# Patient Record
Sex: Male | Born: 1950
Health system: Southern US, Community
[De-identification: ages and names within clinical notes are randomized; demographics above are authoritative.]

## PROBLEM LIST (undated history)

## (undated) DIAGNOSIS — I10 Essential (primary) hypertension: Secondary | ICD-10-CM

## (undated) DIAGNOSIS — E785 Hyperlipidemia, unspecified: Secondary | ICD-10-CM

## (undated) DIAGNOSIS — K635 Polyp of colon: Secondary | ICD-10-CM

## (undated) DIAGNOSIS — M1990 Unspecified osteoarthritis, unspecified site: Secondary | ICD-10-CM

## (undated) DIAGNOSIS — K5790 Diverticulosis of intestine, part unspecified, without perforation or abscess without bleeding: Secondary | ICD-10-CM

## (undated) DIAGNOSIS — D039 Melanoma in situ, unspecified: Secondary | ICD-10-CM

## (undated) DIAGNOSIS — C801 Malignant (primary) neoplasm, unspecified: Secondary | ICD-10-CM

## (undated) DIAGNOSIS — Z87891 Personal history of nicotine dependence: Secondary | ICD-10-CM

## (undated) DIAGNOSIS — R0600 Dyspnea, unspecified: Secondary | ICD-10-CM

## (undated) DIAGNOSIS — T7840XA Allergy, unspecified, initial encounter: Secondary | ICD-10-CM

## (undated) DIAGNOSIS — K219 Gastro-esophageal reflux disease without esophagitis: Secondary | ICD-10-CM

## (undated) DIAGNOSIS — I209 Angina pectoris, unspecified: Secondary | ICD-10-CM

## (undated) DIAGNOSIS — D126 Benign neoplasm of colon, unspecified: Secondary | ICD-10-CM

## (undated) HISTORY — DX: Essential (primary) hypertension: I10

## (undated) HISTORY — DX: Malignant (primary) neoplasm, unspecified: C80.1

## (undated) HISTORY — DX: Allergy, unspecified, initial encounter: T78.40XA

## (undated) HISTORY — PX: TOOTH EXTRACTION: SUR596

## (undated) HISTORY — DX: Melanoma in situ, unspecified: D03.9

## (undated) HISTORY — DX: Personal history of nicotine dependence: Z87.891

## (undated) HISTORY — DX: Hyperlipidemia, unspecified: E78.5

## (undated) HISTORY — DX: Polyp of colon: K63.5

## (undated) HISTORY — DX: Diverticulosis of intestine, part unspecified, without perforation or abscess without bleeding: K57.90

## (undated) HISTORY — PX: JOINT REPLACEMENT: SHX530

## (undated) HISTORY — DX: Benign neoplasm of colon, unspecified: D12.6

## (undated) HISTORY — PX: EYE SURGERY: SHX253

---

## 2000-01-22 ENCOUNTER — Encounter: Payer: Self-pay | Admitting: Family Medicine

## 2000-01-22 LAB — CONVERTED CEMR LAB: PSA: 0.5 ng/mL

## 2000-04-23 ENCOUNTER — Encounter: Payer: Self-pay | Admitting: *Deleted

## 2000-04-23 ENCOUNTER — Ambulatory Visit (HOSPITAL_COMMUNITY): Admission: RE | Admit: 2000-04-23 | Discharge: 2000-04-23 | Payer: Self-pay | Admitting: *Deleted

## 2001-05-21 ENCOUNTER — Encounter: Payer: Self-pay | Admitting: Family Medicine

## 2001-05-21 LAB — CONVERTED CEMR LAB: PSA: 0.6 ng/mL

## 2002-01-21 DIAGNOSIS — D126 Benign neoplasm of colon, unspecified: Secondary | ICD-10-CM

## 2002-01-21 DIAGNOSIS — K635 Polyp of colon: Secondary | ICD-10-CM

## 2002-01-21 HISTORY — DX: Polyp of colon: K63.5

## 2002-01-21 HISTORY — DX: Benign neoplasm of colon, unspecified: D12.6

## 2002-05-03 ENCOUNTER — Encounter: Payer: Self-pay | Admitting: Gastroenterology

## 2003-11-21 ENCOUNTER — Ambulatory Visit (HOSPITAL_COMMUNITY): Admission: RE | Admit: 2003-11-21 | Discharge: 2003-11-21 | Payer: Self-pay | Admitting: Chiropractic Medicine

## 2005-04-18 ENCOUNTER — Ambulatory Visit: Payer: Self-pay | Admitting: Gastroenterology

## 2005-05-02 ENCOUNTER — Ambulatory Visit: Payer: Self-pay | Admitting: Gastroenterology

## 2005-06-12 ENCOUNTER — Ambulatory Visit: Payer: Self-pay | Admitting: Family Medicine

## 2005-06-12 LAB — CONVERTED CEMR LAB: PSA: 0.56 ng/mL

## 2005-06-19 ENCOUNTER — Ambulatory Visit: Payer: Self-pay | Admitting: Family Medicine

## 2005-06-25 ENCOUNTER — Ambulatory Visit: Payer: Self-pay | Admitting: Family Medicine

## 2005-07-19 ENCOUNTER — Ambulatory Visit: Payer: Self-pay | Admitting: Family Medicine

## 2005-10-09 ENCOUNTER — Ambulatory Visit: Payer: Self-pay | Admitting: Family Medicine

## 2005-11-08 ENCOUNTER — Ambulatory Visit: Payer: Self-pay | Admitting: Family Medicine

## 2005-12-11 ENCOUNTER — Ambulatory Visit: Payer: Self-pay | Admitting: Family Medicine

## 2005-12-30 ENCOUNTER — Ambulatory Visit: Payer: Self-pay | Admitting: Gastroenterology

## 2006-01-24 ENCOUNTER — Ambulatory Visit: Payer: Self-pay | Admitting: Gastroenterology

## 2006-01-31 ENCOUNTER — Ambulatory Visit (HOSPITAL_COMMUNITY): Admission: RE | Admit: 2006-01-31 | Discharge: 2006-01-31 | Payer: Self-pay | Admitting: Gastroenterology

## 2006-01-31 ENCOUNTER — Encounter: Payer: Self-pay | Admitting: Gastroenterology

## 2006-02-06 ENCOUNTER — Ambulatory Visit: Payer: Self-pay | Admitting: Gastroenterology

## 2006-03-03 ENCOUNTER — Ambulatory Visit: Payer: Self-pay | Admitting: Family Medicine

## 2006-03-04 ENCOUNTER — Ambulatory Visit: Payer: Self-pay | Admitting: Family Medicine

## 2006-09-30 ENCOUNTER — Encounter: Payer: Self-pay | Admitting: Family Medicine

## 2006-10-01 DIAGNOSIS — K5730 Diverticulosis of large intestine without perforation or abscess without bleeding: Secondary | ICD-10-CM | POA: Insufficient documentation

## 2006-10-01 DIAGNOSIS — E785 Hyperlipidemia, unspecified: Secondary | ICD-10-CM | POA: Insufficient documentation

## 2006-10-01 DIAGNOSIS — I10 Essential (primary) hypertension: Secondary | ICD-10-CM | POA: Insufficient documentation

## 2006-10-29 ENCOUNTER — Ambulatory Visit: Payer: Self-pay | Admitting: Family Medicine

## 2006-10-29 DIAGNOSIS — F528 Other sexual dysfunction not due to a substance or known physiological condition: Secondary | ICD-10-CM | POA: Insufficient documentation

## 2006-10-29 DIAGNOSIS — F17200 Nicotine dependence, unspecified, uncomplicated: Secondary | ICD-10-CM | POA: Insufficient documentation

## 2007-02-18 ENCOUNTER — Ambulatory Visit: Payer: Self-pay | Admitting: Family Medicine

## 2007-02-18 DIAGNOSIS — J069 Acute upper respiratory infection, unspecified: Secondary | ICD-10-CM | POA: Insufficient documentation

## 2007-02-18 DIAGNOSIS — J019 Acute sinusitis, unspecified: Secondary | ICD-10-CM | POA: Insufficient documentation

## 2007-03-31 ENCOUNTER — Ambulatory Visit: Payer: Self-pay | Admitting: Family Medicine

## 2007-03-31 DIAGNOSIS — K5732 Diverticulitis of large intestine without perforation or abscess without bleeding: Secondary | ICD-10-CM | POA: Insufficient documentation

## 2007-03-31 DIAGNOSIS — K219 Gastro-esophageal reflux disease without esophagitis: Secondary | ICD-10-CM | POA: Insufficient documentation

## 2007-04-10 ENCOUNTER — Telehealth: Payer: Self-pay | Admitting: Family Medicine

## 2007-04-15 ENCOUNTER — Ambulatory Visit: Payer: Self-pay | Admitting: Family Medicine

## 2007-04-23 ENCOUNTER — Ambulatory Visit: Payer: Self-pay | Admitting: Family Medicine

## 2007-04-24 LAB — CONVERTED CEMR LAB
BUN: 17 mg/dL (ref 6–23)
CO2: 30 meq/L (ref 19–32)
Calcium: 9.4 mg/dL (ref 8.4–10.5)
Chloride: 106 meq/L (ref 96–112)
Cholesterol: 160 mg/dL (ref 0–200)
Creatinine, Ser: 1.3 mg/dL (ref 0.4–1.5)
GFR calc Af Amer: 73 mL/min
GFR calc non Af Amer: 60 mL/min
Glucose, Bld: 94 mg/dL (ref 70–99)
HDL: 28.7 mg/dL — ABNORMAL LOW (ref >39.0)
LDL Cholesterol: 96 mg/dL (ref 0–99)
Potassium: 4.6 meq/L (ref 3.5–5.1)
Sodium: 139 meq/L (ref 135–145)
Total CHOL/HDL Ratio: 5.6
Triglycerides: 179 mg/dL — ABNORMAL HIGH (ref 0–149)
VLDL: 36 mg/dL (ref 0–40)

## 2007-04-30 ENCOUNTER — Encounter: Payer: Self-pay | Admitting: Family Medicine

## 2007-04-30 ENCOUNTER — Ambulatory Visit: Payer: Self-pay | Admitting: Family Medicine

## 2007-05-06 ENCOUNTER — Ambulatory Visit: Payer: Self-pay | Admitting: Family Medicine

## 2007-05-26 ENCOUNTER — Encounter: Payer: Self-pay | Admitting: Family Medicine

## 2007-05-29 ENCOUNTER — Telehealth: Payer: Self-pay | Admitting: Family Medicine

## 2007-11-30 ENCOUNTER — Encounter: Payer: Self-pay | Admitting: Family Medicine

## 2008-06-16 ENCOUNTER — Encounter: Admission: RE | Admit: 2008-06-16 | Discharge: 2008-06-16 | Payer: Self-pay | Admitting: Family Medicine

## 2008-06-18 ENCOUNTER — Encounter: Admission: RE | Admit: 2008-06-18 | Discharge: 2008-06-18 | Payer: Self-pay | Admitting: Family Medicine

## 2009-01-21 DIAGNOSIS — K5790 Diverticulosis of intestine, part unspecified, without perforation or abscess without bleeding: Secondary | ICD-10-CM

## 2009-01-21 HISTORY — PX: COLONOSCOPY: SHX174

## 2009-01-21 HISTORY — DX: Diverticulosis of intestine, part unspecified, without perforation or abscess without bleeding: K57.90

## 2009-08-29 ENCOUNTER — Encounter (INDEPENDENT_AMBULATORY_CARE_PROVIDER_SITE_OTHER): Payer: Self-pay | Admitting: *Deleted

## 2009-09-22 ENCOUNTER — Encounter: Payer: Self-pay | Admitting: Gastroenterology

## 2009-10-11 ENCOUNTER — Telehealth: Payer: Self-pay | Admitting: Gastroenterology

## 2009-10-23 ENCOUNTER — Ambulatory Visit: Payer: Self-pay | Admitting: Gastroenterology

## 2009-10-23 DIAGNOSIS — R1032 Left lower quadrant pain: Secondary | ICD-10-CM | POA: Insufficient documentation

## 2009-11-16 ENCOUNTER — Ambulatory Visit: Payer: Self-pay | Admitting: Gastroenterology

## 2009-11-29 LAB — HM COLONOSCOPY

## 2010-02-10 ENCOUNTER — Encounter: Payer: Self-pay | Admitting: Gastroenterology

## 2010-02-20 NOTE — Procedures (Signed)
Summary: Sigmoidoscopy   Colonoscopy  Procedure date:  01/31/2006  Findings:      Location:  [facility].    Patient Name: [name], [name] MRN: [id number] Procedure Procedures: Flexible Proctosigmoidoscopy CPT: [phone].  Personnel: Endoscopist: Barbette Hair. Arlyce Dice, MD.  Indications Symptoms: Abdominal pain / bloating.  History  Current Medications: Patient is not currently taking Coumadin.  Pre-Exam Physical: Performed Jan 31, 2006. Entire physical exam was normal.  Exam Exam: Extent visualized: Descending Colon. Extent of exam: 60 cm. ASA Classification: I. Tolerance: good.  Sedation Meds: Fentanyl 75 mcg. given IV. Versed 6 mg. given IV.  Findings - DIVERTICULOSIS: Descending Colon to Sigmoid Colon. ICD9: Diverticulosis: 562.10. Comments: Scattered diverticula.  No mucosal abnormalities.  - NORMAL EXAM: Sigmoid Colon to Rectum.   Assessment Abnormal examination, see findings above.  Diagnoses: 562.10: Diverticulosis.   Events  Unplanned Intervention: No intervention was required.  Unplanned Events: There were no complications. Plans Medication Plan: Continue current medications.  Scheduling: Office Visit, to [employer]. Arlyce Dice, MD, around Mar 07, 2006.    CC: [name]  This report was created from the original endoscopy report, which was reviewed and signed by the above listed endoscopist.

## 2010-02-20 NOTE — Letter (Signed)
Summary: [facility] Instructions  [facility]  [address] [place], [place] [address]   Phone: [phone]  Fax: [phone]       [name]    24-Aug-1950    MRN: [id number]        Procedure Day /Date:THURSDAY 11/16/2009     Arrival Time:1PM     Procedure Time:2PM     Location of Procedure:                    X   [facility] (4th Floor)   PREPARATION FOR COLONOSCOPY WITH MOVIPREP   Starting 5 days prior to your procedure 10/22/2011do not eat nuts, seeds, popcorn, corn, beans, peas,  salads, or any raw vegetables.  Do not take any fiber supplements (e.g. Metamucil, Citrucel, and Benefiber).  THE DAY BEFORE YOUR PROCEDURE         DATE: 11/15/2009  DAY: WEDNESDAY  1.  Drink clear liquids the entire day-NO SOLID FOOD  2.  Do not drink anything colored red or purple.  Avoid juices with pulp.  No orange juice.  3.  Drink at least 64 oz. (8 glasses) of fluid/clear liquids during the day to prevent dehydration and help the prep work efficiently.  CLEAR LIQUIDS INCLUDE: Water Jello Ice Popsicles Tea (sugar ok, no milk/cream) Powdered fruit flavored drinks Coffee (sugar ok, no milk/cream) Gatorade Juice: apple, white grape, white cranberry  Lemonade Clear bullion, consomm, broth Carbonated beverages (any kind) Strained chicken noodle soup Hard Candy                             4.  In the morning, mix first dose of MoviPrep solution:    Empty 1 Pouch A and 1 Pouch B into the disposable container    Add lukewarm drinking water to the top line of the container. Mix to dissolve    Refrigerate (mixed solution should be used within 24 hrs)  5.  Begin drinking the prep at 5:00 p.m. The MoviPrep container is divided by 4 marks.   Every 15 minutes drink the solution down to the next mark (approximately 8 oz) until the full liter is complete.   6.  Follow completed prep with 16 oz of clear liquid of your choice (Nothing red or purple).  Continue  to drink clear liquids until bedtime.  7.  Before going to bed, mix second dose of MoviPrep solution:    Empty 1 Pouch A and 1 Pouch B into the disposable container    Add lukewarm drinking water to the top line of the container. Mix to dissolve    Refrigerate  THE DAY OF YOUR PROCEDURE      DATE: 11/16/2009 DAY: THURSDAY  Beginning at 9a.m. (5 hours before procedure):         1. Every 15 minutes, drink the solution down to the next mark (approx 8 oz) until the full liter is complete.  2. Follow completed prep with 16 oz. of clear liquid of your choice.    3. You may drink clear liquids until 12PM (2 HOURS BEFORE PROCEDURE).   MEDICATION INSTRUCTIONS  Unless otherwise instructed, you should take regular prescription medications with a small sip of water   as early as possible the morning of your procedure.          OTHER INSTRUCTIONS  You will need a responsible adult at least 62 years of age to accompany you  and drive you home.   This person must remain in the waiting room during your procedure.  Wear loose fitting clothing that is easily removed.  Leave jewelry and other valuables at home.  However, you may wish to bring a book to read or  an iPod/MP3 player to listen to music as you wait for your procedure to start.  Remove all body piercing jewelry and leave at home.  Total time from sign-in until discharge is approximately 2-3 hours.  You should go home directly after your procedure and rest.  You can resume normal activities the  day after your procedure.  The day of your procedure you should not:   Drive   Make legal decisions   Operate machinery   Drink alcohol   Return to work  You will receive specific instructions about eating, activities and medications before you leave.    The above instructions have been reviewed and explained to me by   _______________________    I fully understand and can verbalize these instructions  _____________________________ Date _________
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
## 2010-02-20 NOTE — Progress Notes (Signed)
Summary: Do we need to do a referral??  Phone Note Other Incoming   Summary of Call: Dr Hetty Ely, I got a fax for New Patient Referral  to [facility] for this pt on Friday (See your "NOT SCANNED" folder). I showed it to [facility] and she told me to phone note you about it. There are no orders for a referral. The pt recently came by and requested his own records and said he had an appt w/Dr. Josetta Huddle at [facility]. I think the PT did a self referral. The question is then, do we have to do all this paperwork if he did his own referral?? He already got all his medical records! Why do we have to fax them all again??? What do we do? 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 Don't return note to me because I am not here again until Thursday. I guess you would need to either call pt or speak w/Marion about this. Initial call taken by: Mickle Asper,  May 29, 2007 11:03 AM  Follow-up for Phone Call        Shirlee Limerick, are you looking into this? Follow-up by: Shaune Leeks MD,  Jun 02, 2007 9:11 AM  Additional Follow-up for Phone Call Additional follow up Details #1::        Called Watt Geiler his sister in law works at [facility] and knows Dr Josetta Huddle. He agreed to review his records from sister in laws request .  Additional Follow-up by: Carlton Adam,  Jun 02, 2007 9:20 AM    Additional Follow-up for Phone Call Additional follow up Details #2::    He does not have official appt and does not need referral. Follow-up by: Shaune Leeks MD,  Jun 02, 2007 6:37 PM

## 2010-02-20 NOTE — Miscellaneous (Signed)
Summary: Release of  Information  Release of  Information   Imported By: Eleonore Chiquito 05/27/2007 10:29:14  _____________________________________________________________________  External Attachment:    Type:   Image     Comment:   External Document

## 2010-02-20 NOTE — Assessment & Plan Note (Signed)
Summary: DIARRHEA/HEA   Vital Signs:  Patient Profile:   60 Years Old Male Height:     72 inches Weight:      219 pounds Temp:     98.4 degrees F oral Pulse rate:   75 / minute BP sitting:   122 / 81  (left arm) Cuff size:   regular  Vitals Entered By: Cooper Render (March 31, 2007 8:40 AM)                 Chief Complaint:  abd pain, diarrhea, and increased gas.  History of Present Illness: Here for abd pain  LLQ and diarrheax6-7d--has known diverticulosis.  No known unusual foods--continues todrink  ~1gallon of milk weekly--usually does not bother.  No fever or chills.  Last diarrhea last night.  Has taken gas X, no Imodium.   Indigestion with this flair--taking nothing.     Current Allergies (reviewed today): No known allergies   Past Medical History:    Reviewed history from 09/30/2006 and no changes required:       Diverticulosis, colon       Hyperlipidemia       Low back pain     Review of Systems      See HPI   Physical Exam  General:     alert, well-developed, well-nourished, and well-hydrated.  NAD Ears:     R ear normal and L ear normal.   Mouth:     good dentition and pharynx pink and moist.   Lungs:     normal respiratory effort, no intercostal retractions, no accessory muscle use, and normal breath sounds.   Abdomen:     soft, normal bowel sounds, no distention, no masses, no guarding, no abdominal hernia, no inguinal hernia, no hepatomegaly, and no splenomegaly.  tender LLQ only Neurologic:     alert & oriented X3, sensation intact to light touch, and gait normal.   Skin:     turgor normal, color normal, and no rashes.   Psych:     normally interactive and good eye contact.      Impression & Recommendations:  Problem # 1:  DIVERTICULITIS, ACUTE (ICD-562.11) Assessment: New flair of diverticulitis will start on cipro 500 two times a day x7d take 1 Imodium now and repeat in 4-6h as needed see back in 24-36h if not improved   Problem # 2:  GERD (ICD-530.81) will use as needed prilosec OTC His updated medication list for this problem includes:    Prilosec 20 Mg Cpdr (Omeprazole) .Marland Kitchen... 1 qam as needed reflux sx   Complete Medication List: 1)  Allegra 180 Mg Tabs (Fexofenadine hcl) .... One by mouth once daily prn 2)  Motrin Ib 200 Mg Tabs (Ibuprofen) .... Take one q 6 hrs prn 3)  Levitra 20 Mg Tabs (Vardenafil hcl) .... One tab by mouth one hour prior to desired intercourse. 4)  Ciprofloxacin Hcl 500 Mg Tabs (Ciprofloxacin hcl) .Marland Kitchen.. 1 two times a day by mouth for infection 5)  Prilosec 20 Mg Cpdr (Omeprazole) .Marland Kitchen.. 1 qam as needed reflux sx     Prescriptions: CIPROFLOXACIN HCL 500 MG  TABS (CIPROFLOXACIN HCL) 1 two times a day by mouth for infection  #14 x 0   Entered and Authorized by:   Gildardo Griffes FNP   Signed by:   Gildardo Griffes FNP on 03/31/2007   Method used:   Print then Give to Patient   RxID:   [id number]  ] Prior Medications (reviewed today):  ALLEGRA 180 MG  TABS (FEXOFENADINE HCL) one by mouth once daily prn MOTRIN IB 200 MG  TABS (IBUPROFEN) take one q 6 hrs prn LEVITRA 20 MG  TABS (VARDENAFIL HCL) one tab by mouth one hour prior to desired intercourse. Current Allergies (reviewed today): No known allergies

## 2010-02-20 NOTE — Assessment & Plan Note (Signed)
Summary: 1 WK F/U  DLO   Vital Signs:  Patient Profile:   60 Years Old Male Height:     72 inches Temp:     97 degrees F tympanic Pulse rate:   76 / minute Pulse rhythm:   regular BP sitting:   100 / 70  (left arm) Cuff size:   regular  Vitals Entered By: Providence Crosby (April 23, 2007 3:46 PM)                 Chief Complaint:  1 WEEK FOLLOWUP // STILL HAVING ABD. PAIN SOME.  History of Present Illness: Here for followup abd pain in LLQ felt to be poss diverticulitis, on Flagyl and Cipro over the last week, still with abd discomfort in lowe LLQ. Bms are nml, dark brown and urine is dark yellow. OBTW Could this be a muscle pull?  Initially gave me no histiory c/w muscle problem but I know the pt's job has him in funny ositions and lifting at times altho he does not remember straining anything.     Prior Medications Reviewed Using: Patient Recall  Current Allergies: No known allergies       Physical Exam  General:     Well-developed,well-nourished,in no acute distress; alert,appropriate and cooperative throughout examination, NAD seems reasonably comfortable and does not look toxic. Head:     Normocephalic and atraumatic without obvious abnormalities. No apparent alopecia or balding. Eyes:     Conjunctiva clear bilaterally.  Ears:     External ear exam shows no significant lesions or deformities.  Otoscopic examination reveals clear canals, tympanic membranes are intact bilaterally without bulging, retraction, inflammation or discharge. Hearing is grossly normal bilaterally. Nose:     External nasal examination shows no deformity or inflammation. Nasal mucosa are pink and moist without lesions or exudates. Mouth:     Oral mucosa and oropharynx without lesions or exudates.  Teeth in good repair. Neck:     No deformities, masses, or tenderness noted. Lungs:     Normal respiratory effort, chest expands symmetrically. Lungs are clear to auscultation, no crackles or  wheezes. Heart:     Normal rate and regular rhythm. S1 and S2 normal without gallop, murmur, click, rub or other extra sounds. Abdomen:     soft, minimally tender above the left expanse of the pubic bone to direct palpation, no rebound, referred. No swelling, distention, echymosis. BSs nml.    Impression & Recommendations:  Problem # 1:  DIVERTICULITIS, ACUTE (ICD-562.11) Assessment: Improved improved but still with mild discomfort. Will get Pelvic CT to assess bowel wall. If nml, it could be muscle strain...finish abs as rx'd. Orders: Radiology Referral (Radiology)   Complete Medication List: 1)  Allegra 180 Mg Tabs (Fexofenadine hcl) .... One by mouth once daily prn 2)  Motrin Ib 200 Mg Tabs (Ibuprofen) .... Take one q 6 hrs prn 3)  Ciprofloxacin Hcl 500 Mg Tabs (Ciprofloxacin hcl) .Marland Kitchen.. 1 two times a day by mouth for infection 4)  Prilosec 20 Mg Cpdr (Omeprazole) .Marland Kitchen.. 1 qam as needed reflux sx 5)  Metronidazole 500 Mg Tabs (Metronidazole) .... One tab by mouth qid for one week.   Patient Instructions: 1)  Refer for LLQ CT scan. 2)  Come in if scan nml and discomfort continues. 3)  If scan abnormal, we'll call.    ]  Appended Document: Orders Update    Clinical Lists Changes  Orders: Added new Service order of Venipuncture [phone]) - Signed Added new Test  order of TLB-BMP (Basic Metabolic Panel-BMET) (80048-METABOL) - Signed Added new Test order of TLB-Lipid Panel ([id number]-LIPID) - Signed

## 2010-02-20 NOTE — Assessment & Plan Note (Signed)
Summary: FOLLOW UP XRAYS/ [facility]   Vital Signs:  Patient Profile:   60 Years Old Male Height:     72 inches Weight:      218 pounds Temp:     98 degrees F oral Pulse rate:   76 / minute Pulse rhythm:   regular BP sitting:   110 / 70  (left arm) Cuff size:   regular  Vitals Entered ByMarland Kitchen Providence Crosby (May 06, 2007 4:15 PM)                 Chief Complaint:  discuss ct scan of the abd..  History of Present Illness: Here at my request to discuss the findings on CT of Abd...still having mild Lside discomfort and loose stools but doing better and able towork and get along as he is.    Prior Medications Reviewed Using: Patient Recall  Current Allergies: No known allergies       Physical Exam  General:     Well-developed,well-nourished,in no acute distress; alert,appropriate and cooperative throughout examination, looks comfortable. Head:     Normocephalic and atraumatic without obvious abnormalities. No apparent alopecia or balding. Eyes:     No corneal or conjunctival inflammation noted. EOMI. Perrla. Funduscopic exam benign, without hemorrhages, exudates or papilledema. Vision grossly normal. Ears:     External ear exam shows no significant lesions or deformities.  Otoscopic examination reveals clear canals, tympanic membranes are intact bilaterally without bulging, retraction, inflammation or discharge. Hearing is grossly normal bilaterally. Abdomen:     No further exam.    Impression & Recommendations:  Problem # 1:  GERD (ICD-530.81) Assessment: Unchanged Presumed AGE vs GERD vs mild Colitis without inflamm of diverticuli or other entities. His updated medication list for this problem includes:    Prilosec 20 Mg Cpdr (Omeprazole) .Marland Kitchen... 1 qam as needed reflux sx Explained the finding of the Hepatic Hemangioma and desire to f/u one year with another test to insure stability.   Complete Medication List: 1)  Allegra 180 Mg Tabs (Fexofenadine hcl) .... One by  mouth once daily prn 2)  Motrin Ib 200 Mg Tabs (Ibuprofen) .... Take one q 6 hrs prn 3)  Ciprofloxacin Hcl 500 Mg Tabs (Ciprofloxacin hcl) .Marland Kitchen.. 1 two times a day by mouth for infection 4)  Prilosec 20 Mg Cpdr (Omeprazole) .Marland Kitchen.. 1 qam as needed reflux sx 5)  Metronidazole 500 Mg Tabs (Metronidazole) .... One tab by mouth qid for one week.   Patient Instructions: 1)  RTC if sxs don't improve.    ]

## 2010-02-20 NOTE — Letter (Signed)
Summary: [name] letter  [name] at [facility]  [address] [place], [place] [address]   Phone: [phone]  Fax: [phone]       08/29/2009 MRN: [id number]  [name] [address] [place], [place]  [address]  Dear Mr. [name] [place], and Rafael Gonzalez announce the retirement of Arta Silence, M.D., from full-time practice at the [facility] office effective July 20, 2009 and his plans of returning part-time.  It is important to Dr. Hetty Ely and to our practice that you understand that [facility] has seven physicians in our office for your health care needs.  We will continue to offer the same exceptional care that you have today.    Dr. Hetty Ely has spoken to many of you about his plans for retirement and returning part-time in the fall.   We will continue to work with you through the transition to schedule appointments for you in the office and meet the high standards that Oliver is committed to.   Again, it is with great pleasure that we share the news that Dr. Hetty Ely will return to [facility] in October of 2011 with a reduced schedule.    If you have any questions, or would like to request an appointment with one of our physicians, please call us at [phone] and press the option for Scheduling an appointment.  We take pleasure in providing you with excellent patient care and look forward to seeing you at your next office visit.  Our [facility] Physicians are:  Tillman Abide, M.D. Laurita Quint, M.D. Roxy Manns, M.D. Kerby Nora, M.D. Hannah Beat, M.D. Ruthe Mannan, M.D. We proudly welcomed Raechel Ache, M.D. and Eustaquio Boyden, M.D. to the practice in July/August 2011.  Sincerely,  [facility]

## 2010-02-20 NOTE — Procedures (Signed)
Summary: Colonoscopy   Colonoscopy  Procedure date:  05/03/2002  Findings:      Location:  [facility].    Colonoscopy  Procedure date:  05/03/2002  Findings:      Location:  [facility].     Patient Name: [name], [name] MRN: [id number] Procedure Procedures: Colonoscopy CPT: 01027.    with Hot Biopsy(s)CPT: Z451292.    with polypectomy. CPT: A3573898.  Personnel: Endoscopist: Barbette Hair. Arlyce Dice, MD.  Referred By: Laurita Quint, MD.  Indications Symptoms: Abdominal pain / bloating.  History  Pre-Exam Physical: Performed May 03, 2002. Entire physical exam was normal.  Exam Exam: Extent of exam reached: Cecum, extent intended: Cecum.  The cecum was identified by IC valve. Colon retroflexion performed. ASA Classification: I. Tolerance: good.  Monitoring: Pulse and BP monitoring, Oximetry used. Supplemental O2 given. at 2 Liters.  Colon Prep Used Golytely for colon prep. Prep results: good.  Sedation Meds: Fentanyl 100 mcg. given IV. Versed 10 mg. given IV.  Findings POLYP: Descending Colon, Maximum size: 2 mm. Procedure:  hot biopsy, Polyp sent to pathology. ICD9: Colon Polyps: 211.3.  - DIVERTICULOSIS: Descending Colon to Sigmoid Colon. ICD9: Diverticulosis: 562.10. Comments: Diffuse diverticulosis.  NORMAL EXAM: Cecum.  POLYP: Sigmoid Colon, Maximum size: 5 mm. Distance from Anus 25 cm. Procedure:  snare with cautery, sent to pathology.  POLYP: Sigmoid Colon, Maximum size: 2 mm. Distance from Anus 18 cm. sent to pathology.  POLYP: Sigmoid Colon, Maximum size: 2 mm. Distance from Anus 20 cm. sent to pathology.  NORMAL EXAM: Rectum.   Assessment Abnormal examination, see findings above.  Diagnoses: 211.3: Colon Polyps.  562.10: Diverticulosis.   Events  Unplanned Interventions: No intervention was required.  Unplanned Events: There were no complications. Plans Medication Plan: Antispasmodics: Levbid 0.375 BID, starting May 03, 2002   Patient Education: Patient given standard instructions for: Polyps. Diverticulosis.  Scheduling/Referral: Office Visit, to [employer]. Arlyce Dice, MD, around Jun 14, 2002.  Colonoscopy, to Barbette Hair. Arlyce Dice, MD, around May 02, 2005.    CC: [name]  This report was created from the original endoscopy report, which was reviewed and signed by the above listed endoscopist.

## 2010-02-20 NOTE — Assessment & Plan Note (Signed)
Summary: CPX/BIR   Vital Signs:  Patient Profile:   60 Years Old Male Height:     72 inches Weight:      212 pounds Temp:     98.2 degrees F oral Pulse rate:   64 / minute Pulse rhythm:   regular BP sitting:   134 / 90  (left arm) Cuff size:   30 minute cregular  Vitals Entered By: Sydell Axon (October 29, 2006 1:56 PM)                 Chief Complaint:  30 minute checkup.  History of Present Illness: Here for PE.  No problems and feels well. He has occas back pain, not active at current time.   Current Allergies (reviewed today): No known allergies     Risk Factors:  Passive smoke exposure:  no Drug use:  no HIV high-risk behavior:  no Caffeine use:  5+ drinks per day Alcohol use:  yes    Type:  beer    Drinks per day:  <1    Has patient --       Felt need to cut down:  no       Been annoyed by complaints:  no       Felt guilty about drinking:  no       Needed eye opener in the morning:  no    Counseled to quit/cut down alcohol use:  no Exercise:  no Seatbelt use:  100 %   Review of Systems  Eyes      Denies blurring, discharge, double vision, eye irritation, eye pain, halos, itching, light sensitivity, red eye, vision loss-1 eye, and vision loss-both eyes.  ENT      Denies decreased hearing, difficulty swallowing, ear discharge, earache, hoarseness, nasal congestion, nosebleeds, postnasal drainage, ringing in ears, sinus pressure, and sore throat.  CV      Denies bluish discoloration of lips or nails, chest pain or discomfort, difficulty breathing at night, difficulty breathing while lying down, fainting, fatigue, leg cramps with exertion, lightheadness, near fainting, palpitations, shortness of breath with exertion, swelling of feet, swelling of hands, and weight gain.  Resp      Denies chest discomfort, chest pain with inspiration, cough, coughing up blood, excessive snoring, hypersomnolence, morning headaches, pleuritic, shortness of breath, sputum  productive, and wheezing.  GI      Denies abdominal pain, bloody stools, change in bowel habits, constipation, dark tarry stools, diarrhea, excessive appetite, gas, hemorrhoids, indigestion, loss of appetite, nausea, vomiting, vomiting blood, and yellowish skin color.  GU      Denies decreased libido, discharge, dysuria, erectile dysfunction, genital sores, hematuria, incontinence, nocturia, urinary frequency, and urinary hesitancy.  MS      Complains of low back pain and stiffness.      Denies joint pain, joint redness, joint swelling, loss of strength, mid back pain, muscle aches, muscle , cramps, muscle weakness, and thoracic pain.      chronic  Derm      Denies changes in color of skin, changes in nail beds, dryness, excessive perspiration, flushing, hair loss, insect bite(s), itching, lesion(s), poor wound healing, and rash.  Neuro      Denies brief paralysis, difficulty with concentration, disturbances in coordination, falling down, headaches, inability to speak, memory loss, numbness, poor balance, seizures, sensation of room spinning, tingling, tremors, visual disturbances, and weakness.     Impression & Recommendations:  Problem # 1:  HEALTH MAINTENANCE EXAM (ICD-V70.0) Assessment: Comment  Only  Problem # 2:  ERECTILE DYSFUNCTION (ICD-302.72) Assessment: New Trial of Levitra 20mg  His updated medication list for this problem includes:    Levitra 20 Mg Tabs (Vardenafil hcl) ..... One tab by mouth one hour prior to desired intercourse.   Problem # 3:  Hx of LABILE HYPERTENSION (ICD-401.9) Assessment: Unchanged Will follow...has been good in past. BP today: 134/90   Problem # 4:  LOW BACK PAIN W/ RADICULOPATHY (ICD-724.2) Assessment: Unchanged Stable. Given script for Skelaxin if needed. His updated medication list for this problem includes:    Motrin Ib 200 Mg Tabs (Ibuprofen) .Marland Kitchen... Take one q 6 hrs prn    Darvocet-n 100 100-650 Mg Tabs (Propoxyphene n-apap) .....  One by mouth as directed prn    Skelaxin 800 Mg Tabs (Metaxalone) ..... One by mouth as directed prn   Problem # 5:  HYPERLIPIDEMIA (ICD-272.4) Assessment: Unchanged Will recheck in future.  Problem # 6:  DIVERTICULOSIS, COLON (ICD-562.10) Assessment: Unchanged Discussed need to be seen for prolonged lower abd pain longer than 48hrs.  Complete Medication List: 1)  Allegra 180 Mg Tabs (Fexofenadine hcl) .... One by mouth once daily prn 2)  Flexeril  .... Take by mouth as directed prn 3)  Motrin Ib 200 Mg Tabs (Ibuprofen) .... Take one q 6 hrs prn 4)  Darvocet-n 100 100-650 Mg Tabs (Propoxyphene n-apap) .... One by mouth as directed prn 5)  Skelaxin 800 Mg Tabs (Metaxalone) .... One by mouth as directed prn 6)  Levitra 20 Mg Tabs (Vardenafil hcl) .... One tab by mouth one hour prior to desired intercourse.     Prescriptions: SKELAXIN 800 MG  TABS (METAXALONE) one by mouth as directed prn  #50 x 2   Entered and Authorized by:   Shaune Leeks MD   Signed by:   Shaune Leeks MD on 10/29/2006   Method used:   Print then Give to Patient   RxID:   [id number]  ] Current Allergies (reviewed today): No known allergies

## 2010-02-20 NOTE — Progress Notes (Signed)
Summary: Triage-Diverticulitis   Phone Note From Other Clinic Call back at [phone]   Caller: Selena Batten from Dr Caren Macadam office Call For: Dr Arlyce Dice Reason for Call: Schedule Patient Appt Summary of Call: Dr Tanya Nones would like this patient seen before 11-9 for flare up diverticulities, severe llq pain. Initial call taken by: Tawni Levy,  October 11, 2009 12:24 PM  Follow-up for Phone Call        I have spoken w/pt., he is being treated w/antibiotics and pain meds., he is feeling better. Pt. scheduled an appt. w/Dr.Kaplan for 10-23-09 at 4pm.  I notified Kim. Pt. instructed to call back as needed.  Follow-up by: Laureen Ochs LPN,  October 11, 2009 2:04 PM

## 2010-02-20 NOTE — Procedures (Signed)
Summary: Colonoscopy   Colonoscopy  Procedure date:  05/02/2005  Findings:      Location:  [facility].    Patient Name: [name], [name] MRN: [id number] Procedure Procedures: Colonoscopy CPT: 40981.  Personnel: Endoscopist: Barbette Hair. Arlyce Dice, MD.  Patient Consent: Procedure, Alternatives, Risks and Benefits discussed, consent obtained, from patient.  Indications  Surveillance of: Adenomatous Polyp(s).  History  Current Medications: Patient is not currently taking Coumadin.  Pre-Exam Physical: Performed May 03, 2002. Cardio-pulmonary exam, HEENT exam , Abdominal exam, Mental status exam WNL.  Comments: Patient history reviewed/updated, physical performed prior to initiation of sedation? Exam Exam: Extent of exam reached: Cecum, extent intended: Cecum.  The cecum was identified by IC valve. Colon retroflexion performed. ASA Classification: I. Tolerance: good.  Monitoring: Pulse and BP monitoring, Oximetry used. Supplemental O2 given. at 2 Liters.  Colon Prep Used Miralax for colon prep. Prep results: good.  Sedation Meds: Patient assessed and found to be appropriate for moderate (conscious) sedation. Sedation was managed by the Endoscopist. Fentanyl 75 mcg. given IV. Versed 10 mg. given IV.  Findings NORMAL EXAM: Cecum.  - DIVERTICULOSIS: Sigmoid Colon. ICD9: Diverticulosis: 562.10. Comments: Mild diverticular changes.  NORMAL EXAM: Rectum.   Assessment Abnormal examination, see findings above.  Diagnoses: 562.10: Diverticulosis.   Events  Unplanned Interventions: No intervention was required.  Unplanned Events: There were no complications. Plans  Post Exam Instructions: Post sedation instructions given.  Patient Education: Patient given standard instructions for: Diverticulosis.  Disposition: After procedure patient sent to recovery. After recovery patient sent home.  Scheduling/Referral: Colonoscopy, to Barbette Hair. Arlyce Dice, MD, around  May 03, 2010.    CC: [name]       This report was created from the original endoscopy report, which was reviewed and signed by the above listed endoscopist.

## 2010-02-20 NOTE — Assessment & Plan Note (Signed)
Summary: F/U MEDICATION/CLE   Vital Signs:  Patient Profile:   60 Years Old Male Height:     72 inches Weight:      220 pounds Temp:     97.9 degrees F oral Pulse rate:   72 / minute Pulse rhythm:   regular BP sitting:   114 / 80  (left arm) Cuff size:   regular  Vitals Entered By: Providence Crosby (April 15, 2007 3:34 PM)                 Chief Complaint:  follow up medicatiopns.  History of Present Illness: Was seen two weeks ago with abd pain and felt his diverticulosis was bothering him...was given Cipro which may have helped but has been aggravating him since the visit two weeks ago. Having no diarrhea but is more loose than usual. Otherwise he is doing well.    Prior Medications Reviewed Using: Patient Recall  Current Allergies: No known allergies       Physical Exam  General:     Well-developed,well-nourished,in no acute distress; alert,appropriate and cooperative throughout examination Head:     Normocephalic and atraumatic without obvious abnormalities. No apparent alopecia or balding. Eyes:     Conjunctiva clear bilaterally.  Ears:     External ear exam shows no significant lesions or deformities.  Otoscopic examination reveals clear canals, tympanic membranes are intact bilaterally without bulging, retraction, inflammation or discharge. Hearing is grossly normal bilaterally. Nose:     External nasal examination shows no deformity or inflammation. Nasal mucosa are pink and moist without lesions or exudates. Mouth:     Oral mucosa and oropharynx without lesions or exudates.  Teeth in good repair. Neck:     No deformities, masses, or tenderness noted. Lungs:     Normal respiratory effort, chest expands symmetrically. Lungs are clear to auscultation, no crackles or wheezes. Heart:     Normal rate and regular rhythm. S1 and S2 normal without gallop, murmur, click, rub or other extra sounds. Abdomen:     tender in LLQ, BS nml, no referred or rebound.     Impression & Recommendations:  Problem # 1:  DIVERTICULITIS, ACUTE (ICD-562.11) Assessment: Unchanged Presumably partially treated. Will add Flagyl for one week and if not normal or nearly so by one week, will get Pelvic CT to assess.  Complete Medication List: 1)  Allegra 180 Mg Tabs (Fexofenadine hcl) .... One by mouth once daily prn 2)  Motrin Ib 200 Mg Tabs (Ibuprofen) .... Take one q 6 hrs prn 3)  Levitra 20 Mg Tabs (Vardenafil hcl) .... One tab by mouth one hour prior to desired intercourse. 4)  Ciprofloxacin Hcl 500 Mg Tabs (Ciprofloxacin hcl) .Marland Kitchen.. 1 two times a day by mouth for infection 5)  Prilosec 20 Mg Cpdr (Omeprazole) .Marland Kitchen.. 1 qam as needed reflux sx 6)  Metronidazole 500 Mg Tabs (Metronidazole) .... One tab by mouth qid for one week.   Patient Instructions: 1)  RTC one week.    Prescriptions: CIPROFLOXACIN HCL 500 MG  TABS (CIPROFLOXACIN HCL) 1 two times a day by mouth for infection  #14 x 0   Entered and Authorized by:   Shaune Leeks MD   Signed by:   Shaune Leeks MD on 04/15/2007   Method used:   Print then Give to Patient   RxID:   [id number] METRONIDAZOLE 500 MG  TABS (METRONIDAZOLE) one tab by mouth qid for one week.  #28 x 0   Entered and  Authorized by:   Shaune Leeks MD   Signed by:   Shaune Leeks MD on 04/15/2007   Method used:   Print then Give to Patient   RxID:   [id number]  ]

## 2010-02-20 NOTE — Progress Notes (Signed)
Summary: cipro refill  Phone Note Call from Patient Call back at [phone]   Caller: Patient Summary of Call: was given cipro for diverticulitis, still has sxs , but not as bad, requests refill on cipro- still has some pain and diarrhea started back last pm   [facility] Initial call taken by: Lowella Petties,  April 10, 2007 11:04 AM  Follow-up for Phone Call        Refilol written, see me early next week and go to ER if sxs worsen over the weekend. Follow-up by: Shaune Leeks MD,  April 10, 2007 11:53 AM  Additional Follow-up for Phone Call Additional follow up Details #1::        [facility] into [facility]// patient notified and instructions given.  Additional Follow-up by: Providence Crosby,  April 10, 2007 12:04 PM      Prescriptions: CIPROFLOXACIN HCL 500 MG  TABS (CIPROFLOXACIN HCL) 1 two times a day by mouth for infection  #14 x 0   Entered and Authorized by:   Shaune Leeks MD   Signed by:   Shaune Leeks MD on 04/10/2007   Method used:   Print then Give to Patient   RxID:   [id number]

## 2010-02-20 NOTE — Assessment & Plan Note (Signed)
Summary: ST,COUGH/CLE   Vital Signs:  Patient Profile:   60 Years Old Male Height:     72 inches Weight:      221 pounds Temp:     98.1 degrees F oral Pulse rate:   76 / minute Pulse rhythm:   regular BP sitting:   120 / 70  (left arm) Cuff size:   large  Vitals Entered By: Providence Crosby (February 18, 2007 4:05 PM)                 Chief Complaint:  SORETHROAT //SEVERE COUGH NOW.  History of Present Illness: Here for bothersome cough since Monday. He denies H/A, fever, chills, ear pain, has had sinus drainage into throat raw phlegmm yellowish, signif ST still there altho started with that, cough that is really nonproductive, rarely yellow mucous.   No SOB, no N/V. He feels well otherwise.  Current Allergies: No known allergies         Impression & Recommendations:  Problem # 1:  URI (ICD-465.9) Assessment: New See instructions His updated medication list for this problem includes:    Allegra 180 Mg Tabs (Fexofenadine hcl) ..... One by mouth once daily prn    Motrin Ib 200 Mg Tabs (Ibuprofen) .Marland Kitchen... Take one q 6 hrs prn Instructed on symptomatic treatment. Call if symptoms persist or worsen.   Problem # 2:  SINUSITIS- ACUTE-NOS (ICD-461.9) See instructions. His updated medication list for this problem includes:    Amoxicillin 500 Mg Caps (Amoxicillin) .Marland Kitchen... 2 tabs by mouth bid Instructed on treatment. Call if symptoms persist or worsen.   Problem # 3:  ERECTILE DYSFUNCTION (ICD-302.72) Assessment: Unchanged  His updated medication list for this problem includes:    Levitra 20 Mg Tabs (Vardenafil hcl) ..... One tab by mouth one hour prior to desired intercourse. Didn't work well...don't have samps of Viagra to offer today...he declined script.   Problem # 4:  Hx of LABILE HYPERTENSION (ICD-401.9) Assessment: Improved Good control. BP today: 120/70 Prior BP: 134/90 (10/29/2006)   Complete Medication List: 1)  Allegra 180 Mg Tabs (Fexofenadine hcl) .... One  by mouth once daily prn 2)  Motrin Ib 200 Mg Tabs (Ibuprofen) .... Take one q 6 hrs prn 3)  Levitra 20 Mg Tabs (Vardenafil hcl) .... One tab by mouth one hour prior to desired intercourse. 4)  Amoxicillin 500 Mg Caps (Amoxicillin) .... 2 tabs by mouth bid   Patient Instructions: 1)  Guaifenesen, pills or liquid, 600mg  by mouth AM and NOON for the next 4-5 days. 2)  Aleeve two tabs by mouth with breakfast and supper. 3)  Push fluids.  4)  GUAIFENESIN  600mg  by mouth AM and NOON  5)  [facility] MUCOUS RELIEF EXPECTORANT 6)   (400 mg) 11/2 TABS     7)  If npo better in 4-5 days, use AMOX                     Prescriptions: AMOXICILLIN 500 MG  CAPS (AMOXICILLIN) 2 tabs by mouth bid  #56 x 0   Entered and Authorized by:   Shaune Leeks MD   Signed by:   Shaune Leeks MD on 02/18/2007   Method used:   Print then Give to Patient   RxID:   [id number]  ]

## 2010-02-20 NOTE — Procedures (Signed)
Summary: Colonoscopy  Patient: [name] Note: All result statuses are Final unless otherwise noted.  Tests: (1) Colonoscopy (COL)   COL Colonoscopy           [facility]ONE      [facility]     [address]     [place], [place]  [address]           COLONOSCOPY PROCEDURE REPORT           PATIENT:  Michael, French  MR#:  [id number]     BIRTHDATE:  [birth year], 58 yrs. old  GENDER:  male           ENDOSCOPIST:  Barbette Hair. Arlyce Dice, MD     Referred by:           PROCEDURE DATE:  11/16/2009     PROCEDURE:  Diagnostic Colonoscopy     ASA CLASS:  Class II     INDICATIONS:  1) diverticulitis Recent recurrent diverticulitis     with negative CT scan     h/o polyps           MEDICATIONS:   Fentanyl 75 mcg IV, Versed 7 mg IV           DESCRIPTION OF PROCEDURE:   After the risks benefits and     alternatives of the procedure were thoroughly explained, informed     consent was obtained.  Digital rectal exam was performed and     revealed no abnormalities.   The LB 180AL [id number] endoscope was     introduced through the anus and advanced to the cecum, which was     identified by the ileocecal valve, without limitations.  The     quality of the prep was excellent, using MoviPrep.  The instrument     was then slowly withdrawn as the colon was fully examined.     <<PROCEDUREIMAGES>>           FINDINGS:  Moderate diverticulosis was found in the sigmoid colon     (see image12 and image11).  Diverticulitis was found in the     sigmoid colon (see image13). Very mild areas of submucosal     hemorrhage, erythema c/w resolved diverticulitis  This was     otherwise a normal examination of the colon (see image1, image2,     image4, image7, image14, and image15).   Retroflexed views in the     rectum revealed no abnormalities.    The time to cecum =  4.0     minutes. The scope was then withdrawn (time =  6.25  min) from the     patient and the procedure completed.           COMPLICATIONS:  None           ENDOSCOPIC IMPRESSION:     1) Moderate diverticulosis in the sigmoid colon     2) Diverticulitis in the sigmoid colon - resolved     3) Otherwise normal examination     RECOMMENDATIONS:     1) Colonoscopy in 10 years     2) No further treatment           REPEAT EXAM:   10 year(s) Colonoscopy           ______________________________     Barbette Hair. Arlyce Dice, MD           CC: Gilmore Laroche MD           n.  eSIGNED:   Barbette Hair. Kaplan at 11/16/2009 02:16 PM           Percell Belt, [id number]  Note: An exclamation mark (!) indicates a result that was not dispersed into the flowsheet. Document Creation Date: 11/16/2009 2:17 PM _______________________________________________________________________  (1) Order result status: Final Collection or observation date-time: 11/16/2009 14:09 Requested date-time:  Receipt date-time:  Reported date-time:  Referring Physician:   Ordering Physician: Melvia Heaps [phone]) Specimen Source:  Source: Launa Grill Order Number: [id number] Lab site:   Appended Document: Colonoscopy    Clinical Lists Changes  Observations: Added new observation of COLONNXTDUE: 10/2019 (11/16/2009 15:25)

## 2010-02-20 NOTE — Consult Note (Signed)
Summary: [facility]/Dr. Piedad Climes  [facility]/Dr. Piedad Climes   Imported By: Eleonore Chiquito 12/16/2007 11:57:49  _____________________________________________________________________  External Attachment:    Type:   Image     Comment:   External Document

## 2010-06-08 NOTE — Cardiovascular Report (Signed)
[facility]  Patient:    [name], [name]                         MRN: [id number] Proc. Date: 04/23/00 Adm. Date:  [id number] Attending:  Daisey Must CC:         Loyal Jacobson, M.D.  Dietrich Pates, M.D. [facility]  Cath Lab   Cardiac Catheterization  PROCEDURE:  Right and left heart catheterization with coronary angiography and left ventriculography.  INDICATIONS:  Mr. [name] is a 60 year old male with progressive exertional dyspnea.  He had a stress Cardiolite which showed possible ischemia.  He was referred for cardiac catheterization.  DESCRIPTION OF PROCEDURE:  A 6 French sheath was placed in the right femoral artery, and an 8 French sheath in the right femoral vein.  Right heart catheterization was performed with a standard Swan-Ganz catheter.  Left heart catheterization was performed with standard Judkins 6 French catheters. Contrast was Omnipaque.  There were no complications.  RESULTS:  HEMODYNAMICS:  Mean right atrial pressure 8, right ventricular pressure 30/12, pulmonary artery pressure 28/14, pulmonary capillary wedge mean pressure 14, left ventricular pressure 138/16, aortic pressure 120/76. There was no aortic valve gradient.  Cardiac output by the thermodilution method 6.4, cardiac index 2.9.  Cardiac output by the Fick method is 5.7, cardiac index 2.6.  Left ventriculogram.  There is mild global hypokinesis of the left ventricle. Ejection fraction is calculated at 50%.  No mitral regurgitation.  CORONARY ARTERIOGRAPHY:  (Left dominant).  Left main is normal.  Left anterior descending artery has a 25% stenosis in the proximal vessel and 25% in the midvessel.  The LAD gives rise to four small diagonal branches.  Left circumflex is a large dominant vessel.  It gives rise to a large branching obtuse marginal.  The distal circumflex gives rise to a small first posterolateral, normal size secondary posterolateral, and a small  third posterolateral.  There was a normal size posterior descending artery.  The left circumflex is normal by angiography.  The right coronary artery is a small, nondominant vessel.  It is normal by angiography.  IMPRESSION: 1. Normal right and left heart filling pressures with normal pulmonary artery    pressure. 2. Mildly decreased left ventricular systolic function with global    hypokinesis. 3. Insignificant coronary artery disease. DD:  04/23/00 TD:  04/23/00 Job: [id number]

## 2010-06-08 NOTE — Letter (Signed)
December 30, 2005    Arta Silence, MD  [address] [place], [place] [address]   RE:  [name], [name]  MRN:  [id number]  /  DOB:  [birth year]   Dear Dr. Hetty Ely:   Upon your kind referral, I had the pleasure of evaluating your patient  and I am pleased to offer my findings.  I saw [name] in the office  today.  Enclosed is a copy of my progress note that details my findings  and recommendations.   Thank you for the opportunity to participate in your patient's care.    Sincerely,      Barbette Hair. Arlyce Dice, MD,FACG  Electronically Signed    RDK/MedQ  DD: 12/30/2005  DT: 12/30/2005  Job #: [id number]

## 2010-06-08 NOTE — Assessment & Plan Note (Signed)
[facility]                         GASTROENTEROLOGY OFFICE NOTE   NAME:[name], [name]                         MRN:          [id number]  DATE:12/30/2005                            DOB:          [birth year]    PROBLEM:  Abdominal pain.  Mr. [name] is a 60 year old white male  referred through the courtesy of Dr. Hetty Ely for evaluation.  Over the  past couple of years, he has been complaining of periodic left lower  quadrant pain.  This may occur 1 to 2 times out of the month.  He  describes severe sharp pain in the left lower quadrant.  This may last  up to an hour at a time.  This is followed by multiple loose stools that  eventuate with diarrhea and then the passage of liquid only.  Once he  passes liquid, the pain subsides.  There is no history of melena or  hematochezia.  It is not related to any particular foods.  He does have  a history of mild sigmoid diverticulosis diagnosed by colonoscopy in  2007.  This test was done because of a history of colon polyps.   PAST MEDICAL HISTORY:  Unremarkable.   FAMILY HISTORY:  Noncontributory.   MEDICATIONS:  Include Gas-X, aspirin, and an allergy pill.   He has no drug allergies.   He does not smoke.  He drinks occasionally.  He is married.  He works as  a Media planner.   REVIEW OF SYSTEMS:  Positive for shortness of breath and joint pain.   EXAM:  Pulse 80, blood pressure 130/88, weight 215.   PHYSICAL EXAMINATION:  HEENT: EOMI. PERRLA. Sclerae are anicteric.  Conjunctivae are pink.  NECK:  Supple without thyromegaly, adenopathy or carotid bruits.  CHEST:  Clear to auscultation and percussion without adventitious  sounds.  CARDIAC:  Regular rhythm; normal S1 S2.  There are no murmurs, gallops  or rubs.  ABDOMEN:  He has mild left lower quadrant tenderness without guarding or  rebound.  There are no abdominal masses or organomegaly.  EXTREMITIES:  Full range of motion.  No cyanosis, clubbing or  edema.  RECTAL:  Deferred.   IMPRESSION:  Intermittent left lower quadrant pain.  This could be due  to severe spasm, pressure that is related to his diverticular disease,  acute episodes of pain are not likely to be due to his diverticulitis  because of the short duration.   RECOMMENDATION:  1. Flexible sigmoidoscopy looking for inflammatory changes.  2. NuLev 0.25 mg p.r.n. the onset of pain.     Barbette Hair. Arlyce Dice, MD,FACG  Electronically Signed    RDK/MedQ  DD: 12/30/2005  DT: 12/30/2005  Job #: [id number]   cc:   Arta Silence, MD

## 2011-01-08 ENCOUNTER — Emergency Department: Payer: Self-pay | Admitting: Emergency Medicine

## 2012-04-16 ENCOUNTER — Telehealth: Payer: Self-pay | Admitting: Family Medicine

## 2012-04-16 ENCOUNTER — Encounter: Payer: Self-pay | Admitting: Family Medicine

## 2012-04-16 DIAGNOSIS — J329 Chronic sinusitis, unspecified: Secondary | ICD-10-CM

## 2012-04-16 MED ORDER — AMOXICILLIN-POT CLAVULANATE 875-125 MG PO TABS: 1.0000 | ORAL_TABLET | Freq: Two times a day (BID) | ORAL | Status: DC

## 2012-04-16 NOTE — Telephone Encounter (Signed)
Pt having problems sinus congestion wants something called in to [facility]  Call him at [phone]

## 2012-04-16 NOTE — Telephone Encounter (Signed)
Called out augmentin

## 2012-04-16 NOTE — Telephone Encounter (Signed)
Sent to pharmacy 

## 2012-04-23 NOTE — Telephone Encounter (Signed)
Can you make this erroneous please. I am trying to clean up [name]'s in basket. Thanks! I may send you a few more. Sorry!

## 2012-04-24 NOTE — Telephone Encounter (Signed)
This encounter was created in error - please disregard.

## 2012-05-12 ENCOUNTER — Ambulatory Visit (INDEPENDENT_AMBULATORY_CARE_PROVIDER_SITE_OTHER): Payer: 59 | Admitting: Family Medicine

## 2012-05-12 ENCOUNTER — Encounter: Payer: Self-pay | Admitting: Family Medicine

## 2012-05-12 VITALS — BP 126/88 | HR 84 | Temp 97.6°F | Resp 18 | Ht 72.0 in | Wt 224.0 lb

## 2012-05-12 DIAGNOSIS — E785 Hyperlipidemia, unspecified: Secondary | ICD-10-CM | POA: Insufficient documentation

## 2012-05-12 DIAGNOSIS — T7840XA Allergy, unspecified, initial encounter: Secondary | ICD-10-CM | POA: Insufficient documentation

## 2012-05-12 DIAGNOSIS — R0602 Shortness of breath: Secondary | ICD-10-CM

## 2012-05-12 DIAGNOSIS — Z87891 Personal history of nicotine dependence: Secondary | ICD-10-CM | POA: Insufficient documentation

## 2012-05-12 DIAGNOSIS — R0789 Other chest pain: Secondary | ICD-10-CM

## 2012-05-12 DIAGNOSIS — K5790 Diverticulosis of intestine, part unspecified, without perforation or abscess without bleeding: Secondary | ICD-10-CM | POA: Insufficient documentation

## 2012-05-12 MED ORDER — ALBUTEROL SULFATE HFA 108 (90 BASE) MCG/ACT IN AERS: 2.0000 | INHALATION_SPRAY | Freq: Four times a day (QID) | RESPIRATORY_TRACT | Status: DC | PRN

## 2012-05-12 MED ORDER — ALBUTEROL SULFATE (5 MG/ML) 0.5% IN NEBU
2.5000 mg | INHALATION_SOLUTION | Freq: Once | RESPIRATORY_TRACT | Status: AC
  Administered 2012-05-12: 2.5 mg via RESPIRATORY_TRACT

## 2012-05-12 MED ORDER — PREDNISONE 20 MG PO TABS: ORAL_TABLET | ORAL | Status: DC

## 2012-05-12 NOTE — Progress Notes (Signed)
Subjective:    Patient ID: [name], male    DOB: 1950/10/07, 62 y.o.   MRN: [id number]  HPI Patient reports that he's been short of breath for several months that is primarily dyspnea on exertion. He denies any angina.  However, over the last 24 hours, he has become increasingly short of breath. He states that he feels tightness in his lungs and his chest. He denies any chest pain, nausea, vomiting, or radiation of the pain.  He is audibly wheezing today on his exam. He has a 30+ pack year history of smoking.  He also reports sinus congestion and sinus pressure possibly due to allergies. Past Medical History  Diagnosis Date  . Diverticulosis   . Former smoker   . HLD (hyperlipidemia)   . Allergy    Meds-Zyrtec 10 mg by mouth daily.  Allergies  Allergen Reactions  . Levaquin (Levofloxacin In D5w) Shortness Of Breath   History   Social History  . Marital Status: Married    Spouse Name: N/A    Number of Children: N/A  . Years of Education: N/A   Occupational History  . Not on file.   Social History Main Topics  . Smoking status: Former Smoker    Types: Cigarettes    Quit date: 05/13/1998  . Smokeless tobacco: Current User    Types: Chew  . Alcohol Use: No  . Drug Use: No  . Sexually Active: Not on file   Other Topics Concern  . Not on file   Social History Narrative  . No narrative on file        Review of Systems  Constitutional: Negative for fever and fatigue.  HENT: Positive for congestion, rhinorrhea and postnasal drip. Negative for nosebleeds, facial swelling, neck pain and tinnitus.   Respiratory: Positive for chest tightness, shortness of breath and wheezing.   Cardiovascular: Negative for chest pain, palpitations and leg swelling.  Gastrointestinal: Negative.        Objective:   Physical Exam  Constitutional: He appears well-developed and well-nourished.  HENT:  Head: Normocephalic and atraumatic.  Right Ear: External ear normal.  Left Ear:  External ear normal.  Mouth/Throat: Oropharynx is clear and moist.  Eyes: Conjunctivae are normal. Pupils are equal, round, and reactive to light.  Neck: Neck supple. No JVD present. No thyromegaly present.  Cardiovascular: Normal rate, regular rhythm and normal heart sounds.  Exam reveals no gallop and no friction rub.   No murmur heard. Pulmonary/Chest: Effort normal. He has decreased breath sounds in the right upper field, the right lower field, the left upper field and the left lower field. He has wheezes in the right upper field, the right lower field, the left upper field and the left lower field. He has no rhonchi. He has no rales.  Abdominal: Soft. Bowel sounds are normal. He exhibits no distension. There is no tenderness. There is no rebound.  Lymphadenopathy:    He has no cervical adenopathy.   There is no peripheral edema or JVD.       Assessment & Plan:  Shortness of breath Likely asthma exacerbation/reactive airway disease  His exam today is consistent with an exacerbation of asthma vs emphysema given his smoking history and allergies.  I gave the patient a 5 mg albuterol neb. After 10 minutes he felt some improvement in his shortness of breath and chest tightness. I recommended he begin prednisone 20 mg tablets: 3 tablets on days 1 and 2, 2 tablets on days 3  and 4, 1 tablet on day 5 and 6.  I also recommended he get Proair 2 puffs inhaled every 6 hours when necessary chest tightness or wheezing.  If symptoms worsen or change I recommend he go to the emergency room. His EKG in the office today shows normal sinus rhythm with no evidence of ischemia or infarction. I discussed at length with the patient that I cannot rule out a cardiac cause based only on the EKG. However I feel his exam is more consistent with pulmonary source of his problem.  I will schedule an outpatient stress test given his increasing dyspnea exertion.  If the stress test is normal we will get pulmonary function  test to evaluate for emphysema. If he has this he may benefit from symbicort.  I will see the patient back in 48 hours to reevaluate for improvement.  He is to go to the emergency room or return immediately if his symptoms worsen or change.

## 2012-05-13 ENCOUNTER — Other Ambulatory Visit: Payer: Self-pay | Admitting: Family Medicine

## 2012-05-13 DIAGNOSIS — R0602 Shortness of breath: Secondary | ICD-10-CM

## 2012-05-13 LAB — BASIC METABOLIC PANEL
BUN: 18 mg/dL (ref 6–23)
CO2: 21 mEq/L (ref 19–32)
Calcium: 10.2 mg/dL (ref 8.4–10.5)
Chloride: 105 mEq/L (ref 96–112)
Creat: 1.2 mg/dL (ref 0.50–1.35)
Glucose, Bld: 94 mg/dL (ref 70–99)
Potassium: 4.8 mEq/L (ref 3.5–5.3)
Sodium: 138 mEq/L (ref 135–145)

## 2012-05-13 LAB — CBC WITH DIFFERENTIAL/PLATELET
Basophils Absolute: 0.1 10*3/uL (ref 0.0–0.1)
Basophils Relative: 1 % (ref 0–1)
Eosinophils Absolute: 0.5 10*3/uL (ref 0.0–0.7)
Eosinophils Relative: 4 % (ref 0–5)
HCT: 46.8 % (ref 39.0–52.0)
Hemoglobin: 16.2 g/dL (ref 13.0–17.0)
Lymphocytes Relative: 36 % (ref 12–46)
Lymphs Abs: 3.9 10*3/uL (ref 0.7–4.0)
MCH: 28.5 pg (ref 26.0–34.0)
MCHC: 34.6 g/dL (ref 30.0–36.0)
MCV: 82.4 fL (ref 78.0–100.0)
Monocytes Absolute: 0.9 10*3/uL (ref 0.1–1.0)
Monocytes Relative: 8 % (ref 3–12)
Neutro Abs: 5.6 10*3/uL (ref 1.7–7.7)
Neutrophils Relative %: 51 % (ref 43–77)
Platelets: 321 10*3/uL (ref 150–400)
RBC: 5.68 MIL/uL (ref 4.22–5.81)
RDW: 14.7 % (ref 11.5–15.5)
WBC: 10.9 10*3/uL — ABNORMAL HIGH (ref 4.0–10.5)

## 2012-05-13 LAB — BRAIN NATRIURETIC PEPTIDE: Brain Natriuretic Peptide: 2.9 pg/mL (ref 0.0–100.0)

## 2012-05-13 LAB — PATHOLOGIST SMEAR REVIEW

## 2012-05-14 ENCOUNTER — Encounter: Payer: Self-pay | Admitting: Family Medicine

## 2012-05-14 ENCOUNTER — Ambulatory Visit (INDEPENDENT_AMBULATORY_CARE_PROVIDER_SITE_OTHER): Payer: 59 | Admitting: Family Medicine

## 2012-05-14 VITALS — BP 120/68 | HR 86 | Temp 98.2°F | Resp 20 | Wt 225.0 lb

## 2012-05-14 DIAGNOSIS — R0989 Other specified symptoms and signs involving the circulatory and respiratory systems: Secondary | ICD-10-CM

## 2012-05-14 DIAGNOSIS — R0600 Dyspnea, unspecified: Secondary | ICD-10-CM

## 2012-05-14 DIAGNOSIS — R0609 Other forms of dyspnea: Secondary | ICD-10-CM

## 2012-05-14 NOTE — Progress Notes (Signed)
Subjective:    Patient ID: [name], male    DOB: 17-Dec-1950, 62 y.o.   MRN: [id number]  HPI  Patient reports that he's been short of breath for several months that is primarily dyspnea on exertion. He denies any angina.  However, over the last 24 hours, he has become increasingly short of breath. He states that he feels tightness in his lungs and his chest. He denies any chest pain, nausea, vomiting, or radiation of the pain.  He is audibly wheezing today on his exam. He has a 30+ pack year history of smoking.  He also reports sinus congestion and sinus pressure possibly due to allergies. His exam today is consistent with an exacerbation of asthma vs emphysema given his smoking history and allergies.  I gave the patient a 5 mg albuterol neb. After 10 minutes he felt some improvement in his shortness of breath and chest tightness. I recommended he begin prednisone 20 mg tablets: 3 tablets on days 1 and 2, 2 tablets on days 3 and 4, 1 tablet on day 5 and 6.  I also recommended he get Proair 2 puffs inhaled every 6 hours when necessary chest tightness or wheezing.  If symptoms worsen or change I recommend he go to the emergency room.   05/14/12 Patient states that he is no better. He is no longer having pressure in his chest. However he continues to be short of breath. He continues to have dyspnea on exertion. He has borderline hypoxia today in the clinic at 92%. His exam continues to show diminished breath sounds bilaterally. He also has some mild expiratory wheezing  however he reports a worsening cough when he uses the inhaler.  Pulmonary function tests were obtained today in the clinic. This shows a forced vital capacity of 4.3 L and an FEV1 of 3.43 L.  He is an FEV1 percent of 80%. For his lung diameter 90% of predicted. This is not consistent with emphysema or asthma. Past Medical History  Diagnosis Date  . Diverticulosis   . Former smoker   . HLD (hyperlipidemia)   . Allergy    Meds-Zyrtec  10 mg by mouth daily.  Allergies  Allergen Reactions  . Levaquin (Levofloxacin In D5w) Shortness Of Breath   History   Social History  . Marital Status: Married    Spouse Name: N/A    Number of Children: N/A  . Years of Education: N/A   Occupational History  . Not on file.   Social History Main Topics  . Smoking status: Former Smoker    Types: Cigarettes    Quit date: 05/13/1998  . Smokeless tobacco: Current User    Types: Chew  . Alcohol Use: No  . Drug Use: No  . Sexually Active: Not on file   Other Topics Concern  . Not on file   Social History Narrative  . No narrative on file        Review of Systems  Constitutional: Negative for fever and fatigue.  HENT: Positive for congestion, rhinorrhea and postnasal drip. Negative for nosebleeds, facial swelling, neck pain and tinnitus.   Respiratory: Positive for chest tightness, shortness of breath and wheezing.   Cardiovascular: Negative for chest pain, palpitations and leg swelling.  Gastrointestinal: Negative.        Objective:   Physical Exam  Constitutional: He appears well-developed and well-nourished.  HENT:  Head: Normocephalic and atraumatic.  Right Ear: External ear normal.  Left Ear: External ear normal.  Mouth/Throat: Oropharynx  is clear and moist.  Eyes: Conjunctivae are normal. Pupils are equal, round, and reactive to light.  Neck: Neck supple. No JVD present. No thyromegaly present.  Cardiovascular: Normal rate, regular rhythm and normal heart sounds.  Exam reveals no gallop and no friction rub.   No murmur heard. Pulmonary/Chest: Effort normal. He has decreased breath sounds in the right upper field, the right lower field, the left upper field and the left lower field. He has wheezes in the right upper field, the right lower field, the left upper field and the left lower field. He has no rhonchi. He has no rales.  Abdominal: Soft. Bowel sounds are normal. He exhibits no distension. There is no  tenderness. There is no rebound.  Lymphadenopathy:    He has no cervical adenopathy.   There is no peripheral edema or JVD.       Assessment & Plan:  Dyspnea - Plan: DG Chest 2 View, Spirometry with graph  I discussed with patient the differential diagnosis: Emphysema versus lung pathology such as cancer,etc versus heart disease.  I recommended he go immediately to get a chest x-ray. This will rule out lung cancer, subclinical pulmonary infiltrate , and pulmonary edema. His BNP was normal. He was not anemic.  If his chest x-ray is normal, I will need to expedite cardiology consult for stress test.  If there is a pathologic feature on his chest x-ray, this will obviously be treated.  I await the results of the chest x-ray

## 2012-05-15 ENCOUNTER — Ambulatory Visit
Admission: RE | Admit: 2012-05-15 | Discharge: 2012-05-15 | Disposition: A | Discharge status: Home / Self Care | Payer: 59 | Source: Ambulatory Visit | Attending: Family Medicine | Admitting: Family Medicine

## 2012-05-15 ENCOUNTER — Encounter: Payer: Self-pay | Admitting: Family Medicine

## 2012-05-15 DIAGNOSIS — R0600 Dyspnea, unspecified: Secondary | ICD-10-CM

## 2012-05-18 ENCOUNTER — Emergency Department (HOSPITAL_COMMUNITY)
Admission: EM | Admit: 2012-05-18 | Discharge: 2012-05-18 | Disposition: A | Discharge status: Home / Self Care | Payer: 59 | Attending: Emergency Medicine | Admitting: Emergency Medicine

## 2012-05-18 ENCOUNTER — Encounter: Payer: Self-pay | Admitting: Family Medicine

## 2012-05-18 ENCOUNTER — Emergency Department (HOSPITAL_COMMUNITY): Payer: 59

## 2012-05-18 ENCOUNTER — Other Ambulatory Visit: Payer: Self-pay

## 2012-05-18 ENCOUNTER — Encounter (HOSPITAL_COMMUNITY): Payer: Self-pay | Admitting: *Deleted

## 2012-05-18 DIAGNOSIS — Z87891 Personal history of nicotine dependence: Secondary | ICD-10-CM | POA: Insufficient documentation

## 2012-05-18 DIAGNOSIS — R0682 Tachypnea, not elsewhere classified: Secondary | ICD-10-CM | POA: Insufficient documentation

## 2012-05-18 DIAGNOSIS — Z8719 Personal history of other diseases of the digestive system: Secondary | ICD-10-CM | POA: Insufficient documentation

## 2012-05-18 DIAGNOSIS — R0789 Other chest pain: Secondary | ICD-10-CM | POA: Insufficient documentation

## 2012-05-18 DIAGNOSIS — Z79899 Other long term (current) drug therapy: Secondary | ICD-10-CM | POA: Insufficient documentation

## 2012-05-18 DIAGNOSIS — R0600 Dyspnea, unspecified: Secondary | ICD-10-CM

## 2012-05-18 DIAGNOSIS — R0989 Other specified symptoms and signs involving the circulatory and respiratory systems: Secondary | ICD-10-CM | POA: Insufficient documentation

## 2012-05-18 DIAGNOSIS — E785 Hyperlipidemia, unspecified: Secondary | ICD-10-CM | POA: Insufficient documentation

## 2012-05-18 DIAGNOSIS — R0609 Other forms of dyspnea: Secondary | ICD-10-CM | POA: Insufficient documentation

## 2012-05-18 LAB — CBC
HCT: 43.8 % (ref 39.0–52.0)
Hemoglobin: 15 g/dL (ref 13.0–17.0)
MCH: 28.4 pg (ref 26.0–34.0)
MCHC: 34.2 g/dL (ref 30.0–36.0)
MCV: 82.8 fL (ref 78.0–100.0)
Platelets: 337 10*3/uL (ref 150–400)
RBC: 5.29 MIL/uL (ref 4.22–5.81)
RDW: 13.5 % (ref 11.5–15.5)
WBC: 14.8 10*3/uL — ABNORMAL HIGH (ref 4.0–10.5)

## 2012-05-18 LAB — POCT I-STAT TROPONIN I
Troponin i, poc: 0 ng/mL (ref 0.00–0.08)
Troponin i, poc: 0 ng/mL (ref 0.00–0.08)

## 2012-05-18 LAB — BASIC METABOLIC PANEL
BUN: 18 mg/dL (ref 6–23)
CO2: 24 mEq/L (ref 19–32)
Calcium: 9.4 mg/dL (ref 8.4–10.5)
Chloride: 101 mEq/L (ref 96–112)
Creatinine, Ser: 0.99 mg/dL (ref 0.50–1.35)
GFR calc Af Amer: >90 mL/min (ref >90)
GFR calc non Af Amer: 85 mL/min — ABNORMAL LOW (ref >90)
Glucose, Bld: 111 mg/dL — ABNORMAL HIGH (ref 70–99)
Potassium: 4.2 mEq/L (ref 3.5–5.1)
Sodium: 137 mEq/L (ref 135–145)

## 2012-05-18 LAB — PRO B NATRIURETIC PEPTIDE: Pro B Natriuretic peptide (BNP): 56.3 pg/mL (ref 0–125)

## 2012-05-18 MED ORDER — ASPIRIN 81 MG PO CHEW
324.0000 mg | CHEWABLE_TABLET | Freq: Once | ORAL | Status: AC
  Administered 2012-05-18: 324 mg via ORAL
  Filled 2012-05-18: qty 4

## 2012-05-18 MED ORDER — NITROGLYCERIN 0.4 MG SL SUBL
0.4000 mg | SUBLINGUAL_TABLET | SUBLINGUAL | Status: AC | PRN
  Administered 2012-05-18 (×2): 0.4 mg via SUBLINGUAL
  Filled 2012-05-18: qty 25

## 2012-05-18 MED ORDER — LEVALBUTEROL HCL 1.25 MG/3ML IN NEBU: 1.2500 mg | INHALATION_SOLUTION | Freq: Once | RESPIRATORY_TRACT | Status: DC

## 2012-05-18 MED ORDER — IPRATROPIUM BROMIDE 0.02 % IN SOLN
0.5000 mg | Freq: Once | RESPIRATORY_TRACT | Status: AC
  Administered 2012-05-18: 0.5 mg via RESPIRATORY_TRACT
  Filled 2012-05-18: qty 2.5

## 2012-05-18 MED ORDER — LEVALBUTEROL TARTRATE 45 MCG/ACT IN AERO: 1.0000 | INHALATION_SPRAY | RESPIRATORY_TRACT | Status: DC | PRN

## 2012-05-18 MED ORDER — LEVALBUTEROL HCL 1.25 MG/0.5ML IN NEBU
1.2500 mg | INHALATION_SOLUTION | Freq: Once | RESPIRATORY_TRACT | Status: AC
  Administered 2012-05-18: 1.25 mg via RESPIRATORY_TRACT
  Filled 2012-05-18: qty 0.5

## 2012-05-18 MED ORDER — IOHEXOL 350 MG/ML SOLN
100.0000 mL | Freq: Once | INTRAVENOUS | Status: AC | PRN
  Administered 2012-05-18: 100 mL via INTRAVENOUS

## 2012-05-18 MED ORDER — MORPHINE SULFATE 4 MG/ML IJ SOLN
4.0000 mg | Freq: Once | INTRAMUSCULAR | Status: DC
  Filled 2012-05-18: qty 1

## 2012-05-18 NOTE — ED Notes (Signed)
Pt states he is not wanting morphine at the moment.  "i don't think my pain is that bad that i need that.  Let's just do the nitro."  1 Nitro SL given to pt.

## 2012-05-18 NOTE — ED Provider Notes (Signed)
History     CSN: [id number]  Arrival date & time 05/18/12  1423   First MD Initiated Contact with Patient 05/18/12 1659      Chief Complaint  Patient presents with  . Chest Pain  . Shortness of Breath    (Consider location/radiation/quality/duration/timing/severity/associated sxs/prior treatment) HPI Comments: Patient with history of seasonal allergies, former smoker having quit in 2000, has had exertional shortness of breath with occasional chest tightness lasting 15-20 minutes at a time over the last several months. Patient has blown this off until he mentioned it to his primary care physician who has been working with him from a pulmonary standpoint. Patient was recently on a steroid pack, is using breathing treatments and even had pulmonary function studies in the office all of which were not pointing to any kind of obstructive pulmonary process. Patient reports she also had a chest x-ray done recently was told that it looked okay. He apparently has a stress test scheduled with the [facility] cardiology for tomorrow. Today he had another episode of chest tightness but in addition also had a sharp lingering chest pain in his left breast area that was nonradiating which concerned him further. He remains air hungry with sensation of constant dyspnea. Dyspnea does worsen with exertion. He denies fevers, cough. He denies any recent weight loss or night sweats. He denies any significant long distance travel thus noteworthy in the last several months. He reports no significant family history of coronary disease or strokes. At this moment, the patient describes his chest discomfort at a 1-2/10.  The history is provided by the patient and the spouse.    Past Medical History  Diagnosis Date  . Diverticulosis   . Former smoker   . HLD (hyperlipidemia)   . Allergy     History reviewed. No pertinent past surgical history.  History reviewed. No pertinent family history.  History  Substance Use  Topics  . Smoking status: Former Smoker    Types: Cigarettes    Quit date: 05/13/1998  . Smokeless tobacco: Current User    Types: Chew  . Alcohol Use: Yes     Comment: freq      Review of Systems  Constitutional: Negative for fever, chills and diaphoresis.  Respiratory: Positive for chest tightness and shortness of breath.   Cardiovascular: Positive for chest pain. Negative for palpitations and leg swelling.  Gastrointestinal: Negative for nausea and vomiting.  All other systems reviewed and are negative.    Allergies  Levaquin  Home Medications   Current Outpatient Rx  Name  Route  Sig  Dispense  Refill  . ibuprofen (ADVIL,MOTRIN) 200 MG tablet   Oral   Take 200 mg by mouth every 6 (six) hours as needed for pain.         . predniSONE (DELTASONE) 20 MG tablet      3 tabs on days 1-2, 2 tabs on days 3-4,  1 tab on days 5-6   18 tablet   0   . albuterol (PROVENTIL HFA;VENTOLIN HFA) 108 (90 BASE) MCG/ACT inhaler   Inhalation   Inhale 2 puffs into the lungs every 6 (six) hours as needed for wheezing.   1 Inhaler   0   . cetirizine (ZYRTEC) 10 MG tablet   Oral   Take 10 mg by mouth daily.         . clobetasol ointment (TEMOVATE) 0.05 %   Topical   Apply 1 application topically 2 (two) times daily.          Marland Kitchen  levalbuterol (XOPENEX HFA) 45 MCG/ACT inhaler   Inhalation   Inhale 1-2 puffs into the lungs every 4 (four) hours as needed for wheezing or shortness of breath.   1 Inhaler   0     BP 108/79  Pulse 78  Temp(Src) 98.3 F (36.8 C)  Resp 12  SpO2 96%  Physical Exam  Nursing note and vitals reviewed. Constitutional: He is oriented to person, place, and time. He appears well-developed and well-nourished. No distress.  HENT:  Head: Normocephalic and atraumatic.  Eyes: Conjunctivae and EOM are normal. Pupils are equal, round, and reactive to light. No scleral icterus.  Neck: Neck supple. No JVD present.  Cardiovascular: Normal rate and  regular rhythm.   No murmur heard. Pulmonary/Chest: Breath sounds normal. Tachypnea noted. He has no wheezes. He has no rales.  Abdominal: He exhibits no distension. There is no tenderness.  Neurological: He is alert and oriented to person, place, and time. No cranial nerve deficit. Coordination normal.  Skin: Skin is warm and dry. No rash noted. He is not diaphoretic.  Psychiatric: He has a normal mood and affect.    ED Course  Procedures (including critical care time)  Labs Reviewed  CBC - Abnormal; Notable for the following:    WBC 14.8 (*)    All other components within normal limits  BASIC METABOLIC PANEL - Abnormal; Notable for the following:    Glucose, Bld 111 (*)    GFR calc non Af Amer 85 (*)    All other components within normal limits  PRO B NATRIURETIC PEPTIDE  POCT I-STAT TROPONIN I  POCT I-STAT TROPONIN I   Ct Angio Chest W/cm &/or Wo Cm  05/18/2012  *RADIOLOGY REPORT*  Clinical Data: Chest pain and shortness of breath.  CT ANGIOGRAPHY CHEST  Technique:  Multidetector CT imaging of the chest using the standard protocol during bolus administration of intravenous contrast. Multiplanar reconstructed images including MIPs were obtained and reviewed to evaluate the vascular anatomy.  Contrast: OMNIPAQUE IOHEXOL 350 MG/ML SOLN  Comparison: None.  Findings: Technically adequate study with good opacification of the central and segmental pulmonary arteries.  No focal filling defects are demonstrated.  No evidence of significant pulmonary embolus.  Normal heart size.  Coronary artery calcification.  Normal caliber thoracic aorta with mild atherosclerotic changes in the aortic arch.  No abnormal mediastinal fluid collections.  No significant lymphadenopathy in the chest.  Esophagus is decompressed.  No pleural effusions.  Visualized portions of the upper abdominal organs are unremarkable.  There is dependent atelectasis in the lungs.  No focal consolidation or airspace disease.  No  significant interstitial disease.  No pneumothorax.  Airways are patent.  Degenerative changes in the thoracic spine.  IMPRESSION: No evidence of significant pulmonary embolus.   Original Report Authenticated By: Burman Nieves, M.D.      1. Dyspnea     Room air saturation is 98% this to be normal  EKG performed at time 14:26, shows sinus rhythm with short PR wave at 106 ms. Axis is normal. No ST or T-wave abnormalities. Interpretation is normal ECG except for the shortened PR interval. No delta wave is seen.   7:36 PM Initial troponin is negative.  ECG shows no ischemia.  Pt given ASA and NTG with no improvement in his dyspnea nor his 1-2/10 CP.  I had spoken to Dr. Dietrich Pates who feels that if the patient is pain free, with neg ECG and troponin, pt can be discharged with 24 hour  stress test as outpt which is already scheduled.  Will check second troponin and give additional NTG and morphine and will reasess.  9:42 PM I again reviewed prior notes from Dr. Tanya Nones.  Does not seem to be pulmonary obstructive disease.  CT angio shows no PE, some atelectasis.  Will try a xopenex and atrovent neb.  Pt does desaturate down to 90% with ambulation, then goes back to 99% after some deep breaths. Pt has been using albuterol inhaler and steroids at home for past 5 days already.     11:14 PM CT angio shows no PE, no clear explanation for desaturation.  Also notes in [facility] now indicate pt's stress test was moved up to occur with Dr. Jacinto Halim tomorrow.  Pt feels improved after nebulized xopenex and atrovent. Will d/c home which is his preference, will refer to pulmonary, I think ok to see cardiologist tomorrow.       MDM   Patient with history of former smoker, currently chews tobacco with symptoms concerning for coronary artery disease. EKG shows no overt ischemia, initial troponin is normal. Plan is to give aspirin and nitroglycerin for some symptomatic relief and will consult with the [facility] cardiology  to consider observation admission versus going ahead and performing cardiac catheterization.           Gavin Pound. Trafton Roker, MD 05/18/12 2320

## 2012-05-18 NOTE — Discharge Instructions (Signed)
    Shortness of Breath Shortness of breath means you have trouble breathing. Shortness of breath may indicate that you have a medical problem. You should seek immediate medical care for shortness of breath. CAUSES   Not enough oxygen in the air (as with high altitudes or a smoke-filled room).  Short-term (acute) lung disease, including:  Infections, such as pneumonia.  Fluid in the lungs, such as heart failure.  A blood clot in the lungs (pulmonary embolism).  Long-term (chronic) lung diseases.  Heart disease (heart attack, angina, heart failure, and others).  Low red blood cells (anemia).  Poor physical fitness. This can cause shortness of breath when you exercise.  Chest or back injuries or stiffness.  Being overweight.  Smoking.  Anxiety. This can make you feel like you are not getting enough air. DIAGNOSIS  Serious medical problems can usually be found during your physical exam. Tests may also be done to determine why you are having shortness of breath. Tests may include:  Chest X-rays.  Lung function tests.  Blood tests.  Electrocardiography.  Exercise testing.  Echocardiography.  Imaging scans. Your caregiver may not be able to find a cause for your shortness of breath after your exam. In this case, it is important to have a follow-up exam with your caregiver as directed.  TREATMENT  Treatment for shortness of breath depends on the cause of your symptoms and can vary greatly. HOME CARE INSTRUCTIONS   Do not smoke. Smoking is a common cause of shortness of breath. If you smoke, ask for help to quit.  Avoid being around chemicals or things that may bother your breathing, such as paint fumes and dust.  Rest as needed. Slowly resume your usual activities.  If medicines were prescribed, take them as directed for the full length of time directed. This includes oxygen and any inhaled medicines.  Keep all follow-up appointments as directed by your  caregiver. SEEK MEDICAL CARE IF:   Your condition does not improve in the time expected.  You have a hard time doing your normal activities even with rest.  You have any side effects or problems with the medicines prescribed.  You develop any new symptoms. SEEK IMMEDIATE MEDICAL CARE IF:   Your shortness of breath gets worse.  You feel lightheaded, faint, or develop a cough not controlled with medicines.  You start coughing up blood.  You have pain with breathing.  You have chest pain or pain in your arms, shoulders, or abdomen.  You have a fever.  You are unable to walk up stairs or exercise the way you normally do. MAKE SURE YOU:  Understand these instructions.  Will watch your condition.  Will get help right away if you are not doing well or get worse. Document Released: 10/02/2000 Document Revised: 07/09/2011 Document Reviewed: 03/25/2011 [facility] Patient Information 2013 [place], [place].

## 2012-05-18 NOTE — ED Notes (Signed)
Ambulate patient in hall. Sats dropped to 90 % O2 at the lowest. Patient said that he feels like he needs to take deep breaths to get enough air. When he did the sats would move back up to 97% O2. Patient was a little winded after walking the hall.

## 2012-05-18 NOTE — ED Notes (Signed)
Pt is here with increasing sob over the last month and now for the last week has been developing tightness in chest.  Pt is scheduled for a stress test tomorrow

## 2012-05-18 NOTE — ED Notes (Signed)
Patient transported to CT 

## 2012-05-18 NOTE — ED Notes (Signed)
Spoke with Lupita Leash from service response.  Heart healthy diet ordered for pt.

## 2012-05-18 NOTE — Progress Notes (Unsigned)
Patient ID: [name], male   DOB: 1950/01/26, 62 y.o.   MRN: [id number]  Dr. Tanya Nones wanted pt to have hs stress test expedited his new appt is on Tues 05/19/12 at 4pm with dr. Jacinto Halim.

## 2012-05-20 ENCOUNTER — Telehealth: Payer: Self-pay | Admitting: Family Medicine

## 2012-05-20 DIAGNOSIS — R0600 Dyspnea, unspecified: Secondary | ICD-10-CM

## 2012-05-20 NOTE — Telephone Encounter (Signed)
Pt aware and sample left up front for him to pick up

## 2012-05-20 NOTE — Telephone Encounter (Signed)
No I definitely want him to see pulmonology. and I have placed te referral. Meanwhile, will he try Symbicort 160/4.5 2 puffs inh bid until seen.  He can come get a sample here.

## 2012-05-25 ENCOUNTER — Encounter: Payer: Self-pay | Admitting: Pulmonary Disease

## 2012-05-25 ENCOUNTER — Ambulatory Visit (INDEPENDENT_AMBULATORY_CARE_PROVIDER_SITE_OTHER): Payer: 59 | Admitting: Pulmonary Disease

## 2012-05-25 VITALS — BP 120/80 | HR 77 | Temp 97.4°F | Ht 72.0 in | Wt 223.2 lb

## 2012-05-25 DIAGNOSIS — R0602 Shortness of breath: Secondary | ICD-10-CM

## 2012-05-25 DIAGNOSIS — J309 Allergic rhinitis, unspecified: Secondary | ICD-10-CM

## 2012-05-25 MED ORDER — FLUTICASONE PROPIONATE 50 MCG/ACT NA SUSP: 2.0000 | Freq: Every day | NASAL | Status: DC

## 2012-05-25 NOTE — Patient Instructions (Addendum)
We will call you when we see the results of the pulmonary function testing  You can buy your generic zyrtec (cetirizine) from [employer] (year supply is around $10)  Use the flonase two puffs each nostril daily; this can be substituted with the over the counter version (nasacort) or Nasonex (prescription form)  Use saline sprays 3-4 times a day in each nostril or Lloyd Huger Med rinses (use distilled water with the [employer] Med rinse)  Exercise regularly with a goal of walking 35 minutes daily  We will see you back in 3 months or sooner if you are worse.

## 2012-05-25 NOTE — Progress Notes (Signed)
Subjective:    Patient ID: [name], male    DOB: [birth year], 62 y.o.   MRN: [id number]  HPI  This is a 62 year old male who comes her clinic today for evaluation of shortness of breath. He states he had a normal childhood without respiratory illnesses but unfortunately smoked one pack of cigarettes daily for 30 years. He quit smoking several years ago. He states that for the last several years he's noticed increasing shortness of breath with walking, doing activities in the yard, or climbing a flight of stairs. He states specifically he has noticed that while walking a distance of several 100 yards to a deer stand near his house where he hunts that he feels more short of breath. He states he has a cough productive of yellow to brown mucus. This occurs only when he has significant sinus symptoms which is a good portion of the year. When he does not have sinus disease he does not have a cough productive of mucus. He states that his sinus congestion, postnasal drip and scratchy throat have been worse in the last several weeks. He still works as a Ship broker. He is not exposed to significant chemicals, dust, fumes there. He lives in a house but does not have mold or mildew issues. He has not move recently. He has not started any new medications recently. He worked for a Public [employer] in the 1970s for a few years but had no significant exposures there that he is aware of. He states that he participated in pulmonary function testing during that time and never had any problems. He was recently started on Advair which he says has not helped. He reportedly had spirometry performed by his primary care physician which was normal.   Past Medical History  Diagnosis Date  . Diverticulosis   . Former smoker   . HLD (hyperlipidemia)   . Allergy      Family History  Problem Relation Age of Onset  . Cancer Father     lung  . Heart disease Mother      History   Social History  .  Marital Status: Married    Spouse Name: N/A    Number of Children: 2  . Years of Education: N/A   Occupational History  . part sales    Social History Main Topics  . Smoking status: Former Smoker -- 1.00 packs/day for 30 years    Types: Cigarettes, Cigars    Quit date: 05/13/1998  . Smokeless tobacco: Current User    Types: Chew  . Alcohol Use: Yes     Comment: freq  . Drug Use: No  . Sexually Active: Not on file   Other Topics Concern  . Not on file   Social History Narrative  . No narrative on file     Allergies  Allergen Reactions  . Levaquin (Levofloxacin In D5w) Shortness Of Breath     Outpatient Prescriptions Prior to Visit  Medication Sig Dispense Refill  . albuterol (PROVENTIL HFA;VENTOLIN HFA) 108 (90 BASE) MCG/ACT inhaler Inhale 2 puffs into the lungs every 6 (six) hours as needed for wheezing.  1 Inhaler  0  . cetirizine (ZYRTEC) 10 MG tablet Take 10 mg by mouth daily.      . clobetasol ointment (TEMOVATE) 0.05 % Apply 1 application topically 2 (two) times daily.       Marland Kitchen ibuprofen (ADVIL,MOTRIN) 200 MG tablet Take 200 mg by mouth every 6 (six) hours as needed for  pain.      . levalbuterol (XOPENEX HFA) 45 MCG/ACT inhaler Inhale 1-2 puffs into the lungs every 4 (four) hours as needed for wheezing or shortness of breath.  1 Inhaler  0  . predniSONE (DELTASONE) 20 MG tablet 3 tabs on days 1-2, 2 tabs on days 3-4,  1 tab on days 5-6  18 tablet  0   No facility-administered medications prior to visit.      Review of Systems  Constitutional: Negative for fever and unexpected weight change.  HENT: Positive for congestion. Negative for ear pain, nosebleeds, sore throat, rhinorrhea, sneezing, trouble swallowing, dental problem, postnasal drip and sinus pressure.   Eyes: Negative for redness and itching.  Respiratory: Positive for cough, shortness of breath and wheezing. Negative for chest tightness.   Cardiovascular: Positive for chest pain. Negative for  palpitations and leg swelling.  Gastrointestinal: Negative for nausea and vomiting.  Genitourinary: Negative for dysuria.  Musculoskeletal: Negative for joint swelling.  Skin: Negative for rash.  Neurological: Negative for headaches.  Hematological: Does not bruise/bleed easily.  Psychiatric/Behavioral: Negative for dysphoric mood. The patient is not nervous/anxious.        Objective:   Physical Exam  Filed Vitals:   05/25/12 0924  BP: 120/80  Pulse: 77  Temp: 97.4 F (36.3 C)  TempSrc: Oral  Height: 6' (1.829 m)  Weight: 223 lb 3.2 oz (101.243 kg)  SpO2: 95%   Gen: well appearing, no acute distress HEENT: NCAT, PERRL, EOMi, OP clear, neck supple without masses, nasal mucosa inflammed, mucus noted PULM: CTA B CV: RRR, no mgr, no JVD AB: BS+, soft, nontender, no hsm Ext: warm, no edema, no clubbing, no cyanosis Derm: no rash or skin breakdown Neuro: A&Ox4, CN II-XII intact, strength 5/5 in all 4 extremities       Assessment & Plan:   Shortness of breath I explained to Mr. Jipson that dyspnea can a symptom failure of nearly any organ system, most commonly the pulmonary, cardiovascular, or hematologic systems.  I am encouraged by the fact that his lung exam is normal, his lung parenchyma was normal on recent CT chest, and his recent stress test was normal.  Also, his recent hemoglobin was normal as well.  At this point the ddx still includes pulmonary vascular disease, COPD, or less likely some other form of restrictive lung disease or valvular heart disease.  Fortunately, I do not see clear evidence of these on exam.  Deconditioning is also likely, and his recent 15lb weight gain is not helpful.  Plan: -full PFT's with and without bronchodilator -continue inhalers for now, thought I don't think they are helping -if PFT's normal, then exercise regularly and try to lose weight -if no improvement with regular exercise and weight loss then order echo and CPET  Allergic  rhinitis He could benefit from the use of a steroid inhaler and saline rinses.  Plan: -continue generic cetirizine -add flonase -add Lloyd Huger Med rinses or saline rinses   Updated Medication List Outpatient Encounter Prescriptions as of 05/25/2012  Medication Sig Dispense Refill  . albuterol (PROVENTIL HFA;VENTOLIN HFA) 108 (90 BASE) MCG/ACT inhaler Inhale 2 puffs into the lungs every 6 (six) hours as needed for wheezing.  1 Inhaler  0  . budesonide-formoterol (SYMBICORT) 160-4.5 MCG/ACT inhaler Inhale 2 puffs into the lungs 2 (two) times daily.      . cetirizine (ZYRTEC) 10 MG tablet Take 10 mg by mouth daily.      . clobetasol ointment (TEMOVATE) 0.05 %  Apply 1 application topically 2 (two) times daily.       Marland Kitchen ibuprofen (ADVIL,MOTRIN) 200 MG tablet Take 200 mg by mouth every 6 (six) hours as needed for pain.      Marland Kitchen levalbuterol (XOPENEX HFA) 45 MCG/ACT inhaler Inhale 1-2 puffs into the lungs every 4 (four) hours as needed for wheezing or shortness of breath.  1 Inhaler  0  . fluticasone (FLONASE) 50 MCG/ACT nasal spray Place 2 sprays into the nose daily.  16 g  2  . [DISCONTINUED] predniSONE (DELTASONE) 20 MG tablet 3 tabs on days 1-2, 2 tabs on days 3-4,  1 tab on days 5-6  18 tablet  0   No facility-administered encounter medications on file as of 05/25/2012.

## 2012-05-25 NOTE — Assessment & Plan Note (Addendum)
I explained to [name] that dyspnea can a symptom failure of nearly any organ system, most commonly the pulmonary, cardiovascular, or hematologic systems.  I am encouraged by the fact that [name] lung exam is normal, [name] lung parenchyma was normal on recent CT chest, and [name] recent stress test was normal.  Also, [name] recent hemoglobin was normal as well.  At this point the ddx still includes pulmonary vascular disease, COPD, or less likely some other form of restrictive lung disease or valvular heart disease.  Fortunately, I do not see clear evidence of these on exam.  Deconditioning is also likely, and [name] recent 15lb weight gain is not helpful.  Plan: -full PFT's with and without bronchodilator -continue inhalers for now, thought I don't think they are helping -if PFT's normal, then exercise regularly and try to lose weight -if no improvement with regular exercise and weight loss then order echo and CPET

## 2012-05-25 NOTE — Assessment & Plan Note (Signed)
He could benefit from the use of a steroid inhaler and saline rinses.  Plan: -continue generic cetirizine -add flonase -add Lloyd Huger Med rinses or saline rinses

## 2012-06-07 ENCOUNTER — Other Ambulatory Visit: Payer: Self-pay | Admitting: Family Medicine

## 2012-06-08 ENCOUNTER — Encounter: Payer: 59 | Admitting: Physician Assistant

## 2012-06-08 NOTE — Telephone Encounter (Signed)
Ok to refill with 2 refills 

## 2012-06-08 NOTE — Telephone Encounter (Signed)
Med refilled.

## 2012-06-08 NOTE — Telephone Encounter (Signed)
?  ok to refill °

## 2012-06-19 ENCOUNTER — Encounter: Payer: Self-pay | Admitting: Family Medicine

## 2012-06-19 ENCOUNTER — Ambulatory Visit (INDEPENDENT_AMBULATORY_CARE_PROVIDER_SITE_OTHER): Payer: 59 | Admitting: Family Medicine

## 2012-06-19 VITALS — BP 120/78 | HR 84 | Temp 98.3°F | Resp 16 | Ht 72.0 in | Wt 223.0 lb

## 2012-06-19 DIAGNOSIS — D039 Melanoma in situ, unspecified: Secondary | ICD-10-CM | POA: Insufficient documentation

## 2012-06-19 DIAGNOSIS — Z0000 Encounter for general adult medical examination without abnormal findings: Secondary | ICD-10-CM

## 2012-06-19 DIAGNOSIS — Z23 Encounter for immunization: Secondary | ICD-10-CM

## 2012-06-19 NOTE — Progress Notes (Signed)
Subjective:    Patient ID: [name], male    DOB: 1950-12-28, 62 y.o.   MRN: [id number]  HPI Here for CPE.  Scheduled to see Dr. Sherene Sires regarding his dyspnea on exertion Monday.  Has had a negative cardiac work up to date.  Denies other symptoms at present.   Past Medical History  Diagnosis Date  . Diverticulosis   . Former smoker   . HLD (hyperlipidemia)   . Allergy   . Melanoma in situ    Current Outpatient Prescriptions on File Prior to Visit  Medication Sig Dispense Refill  . cetirizine (ZYRTEC) 10 MG tablet Take 10 mg by mouth daily.      . clobetasol ointment (TEMOVATE) 0.05 % Apply 1 application topically 2 (two) times daily.       . fluticasone (FLONASE) 50 MCG/ACT nasal spray Place 2 sprays into the nose daily.  16 g  2  . ibuprofen (ADVIL,MOTRIN) 200 MG tablet Take 200 mg by mouth every 6 (six) hours as needed for pain.      . meloxicam (MOBIC) 15 MG tablet TAKE 1 TABLET BY MOUTH ONCE A DAY  30 tablet  2   No current facility-administered medications on file prior to visit.   Allergies  Allergen Reactions  . Levaquin (Levofloxacin In D5w) Shortness Of Breath   History   Social History  . Marital Status: Married    Spouse Name: N/A    Number of Children: 2  . Years of Education: N/A   Occupational History  . part sales    Social History Main Topics  . Smoking status: Former Smoker -- 1.00 packs/day for 30 years    Types: Cigarettes, Cigars    Quit date: 05/13/1998  . Smokeless tobacco: Current User    Types: Chew  . Alcohol Use: Yes     Comment: freq  . Drug Use: No  . Sexually Active: Not on file   Other Topics Concern  . Not on file   Social History Narrative  . No narrative on file   Family History  Problem Relation Age of Onset  . Cancer Father     lung  . Heart disease Mother     died in her 72's  . Diabetes Daughter       Review of Systems  All other systems reviewed and are negative.       Objective:   Physical Exam  Vitals  reviewed. Constitutional: He is oriented to person, place, and time. He appears well-developed and well-nourished.  HENT:  Head: Normocephalic and atraumatic.  Right Ear: External ear normal.  Left Ear: External ear normal.  Nose: Nose normal.  Mouth/Throat: Oropharynx is clear and moist. No oropharyngeal exudate.  Eyes: Conjunctivae and EOM are normal. Pupils are equal, round, and reactive to light. Right eye exhibits no discharge. Left eye exhibits no discharge. No scleral icterus.  Neck: Normal range of motion. Neck supple. No JVD present. No tracheal deviation present. No thyromegaly present.  Cardiovascular: Normal rate, regular rhythm and normal heart sounds.  Exam reveals no gallop and no friction rub.   No murmur heard. Pulmonary/Chest: Effort normal and breath sounds normal. No respiratory distress. He has no wheezes. He has no rales. He exhibits no tenderness.  Abdominal: Soft. Bowel sounds are normal. He exhibits no distension and no mass. There is no tenderness. There is no rebound and no guarding.  Genitourinary: Rectum normal and prostate normal.  Musculoskeletal: Normal range of motion. He exhibits  no edema and no tenderness.  Lymphadenopathy:    He has no cervical adenopathy.  Neurological: He is alert and oriented to person, place, and time. He has normal reflexes. He displays normal reflexes. No cranial nerve deficit. He exhibits normal muscle tone. Coordination normal.  Skin: Skin is warm and dry. No rash noted. No erythema. No pallor.  Psychiatric: He has a normal mood and affect. His behavior is normal. Judgment and thought content normal.          Assessment & Plan:  1. Routine general medical examination at a health care facility Normal physical exam. Colonoscopy was 2 years ago and was normal. Prostate exam today was normal I will check a PSA. Tetanus shot is up-to-date. I did give him the Pneumovax today given his dyspnea on exertion need a booster in 5 years. I  have asked the patient to return fasting for a fasting lipid panel at his convenience. Routine health maintenance is discussed. - PSA - Pneumococcal polysaccharide vaccine 23-valent greater than or equal to 2yo subcutaneous/IM

## 2012-06-20 LAB — PSA: PSA: 0.89 ng/mL (ref <=4.00)

## 2012-07-07 ENCOUNTER — Other Ambulatory Visit: Payer: Self-pay | Admitting: Family Medicine

## 2012-07-07 NOTE — Telephone Encounter (Signed)
Medication refilled per protocol. 

## 2012-07-13 ENCOUNTER — Ambulatory Visit (INDEPENDENT_AMBULATORY_CARE_PROVIDER_SITE_OTHER): Payer: 59 | Admitting: Pulmonary Disease

## 2012-07-13 DIAGNOSIS — R0602 Shortness of breath: Secondary | ICD-10-CM

## 2012-07-13 NOTE — Progress Notes (Signed)
PFT done Today. Carron Curie, CMA

## 2012-07-14 ENCOUNTER — Encounter: Payer: Self-pay | Admitting: Pulmonary Disease

## 2012-09-24 ENCOUNTER — Telehealth: Payer: Self-pay | Admitting: Pulmonary Disease

## 2012-09-24 MED ORDER — FLUTICASONE PROPIONATE 50 MCG/ACT NA SUSP: 2.0000 | Freq: Every day | NASAL | Status: DC

## 2012-09-24 NOTE — Telephone Encounter (Signed)
RX has been called in. Nothing further needed 

## 2012-09-30 ENCOUNTER — Telehealth: Payer: Self-pay | Admitting: Family Medicine

## 2012-09-30 NOTE — Telephone Encounter (Signed)
Called pt and he does not want to talk to me either wants only to talk to you. Please call. He said nothing major or life threatening.

## 2012-09-30 NOTE — Telephone Encounter (Signed)
Pt said that he has a couple of questions that he wants to ask Dr. Tanya Nones (would not tell me), but he also said that his diverticulitis is acting up again and would like to talk to someone about this when you get a chance.

## 2012-10-01 ENCOUNTER — Other Ambulatory Visit: Payer: Self-pay | Admitting: Family Medicine

## 2012-10-01 MED ORDER — CIPROFLOXACIN HCL 500 MG PO TABS: 500.0000 mg | ORAL_TABLET | Freq: Two times a day (BID) | ORAL | Status: DC

## 2012-10-01 MED ORDER — METRONIDAZOLE 500 MG PO TABS: 500.0000 mg | ORAL_TABLET | Freq: Three times a day (TID) | ORAL | Status: DC

## 2012-10-01 NOTE — Telephone Encounter (Signed)
4-5 days of LLQ abd paina nd diarrhea.  Feels like his previous bout of diverticulitis.  I will cal out cipro 500 bid and flagyl 500 tid for 10 days.  NTBS if worsening or not completely better.

## 2012-10-05 ENCOUNTER — Telehealth: Payer: Self-pay | Admitting: Family Medicine

## 2012-10-05 NOTE — Telephone Encounter (Signed)
Pt said that you sent in some medication for him and he started taking it on Friday. He said that he is not any better. He wanted to come in and see you today, but you do not have any appointments. I offered him an appointment with Dr. Jeanice Lim or [facility] and he said that he did not want to see anyone else. He wants to know what he can do.

## 2012-10-05 NOTE — Telephone Encounter (Signed)
Pt aware.

## 2012-10-05 NOTE — Telephone Encounter (Signed)
Please make appt tomorrow with me if he doesn't want the available appt today.

## 2012-10-06 ENCOUNTER — Ambulatory Visit (INDEPENDENT_AMBULATORY_CARE_PROVIDER_SITE_OTHER): Payer: 59 | Admitting: Family Medicine

## 2012-10-06 ENCOUNTER — Encounter: Payer: Self-pay | Admitting: Family Medicine

## 2012-10-06 VITALS — BP 110/74 | HR 94 | Temp 98.2°F | Resp 18 | Ht 72.0 in | Wt 222.0 lb

## 2012-10-06 DIAGNOSIS — K5732 Diverticulitis of large intestine without perforation or abscess without bleeding: Secondary | ICD-10-CM

## 2012-10-06 MED ORDER — AMOXICILLIN-POT CLAVULANATE 875-125 MG PO TABS: 1.0000 | ORAL_TABLET | Freq: Two times a day (BID) | ORAL | Status: DC

## 2012-10-06 NOTE — Progress Notes (Signed)
Subjective:    Patient ID: [name], male    DOB: 1950/07/13, 62 y.o.   MRN: [id number]  HPI One week ago the patient began having diarrhea and left lower quadrant abdominal pain.It felt like his typical exacerbation of diverticulitis. I spoke to the patient on Thursday over the telephone and prescribed Cipro 500 mg by mouth twice a day and Flagyl 500 mg by mouth 3 times a day for empiric treatment of diverticulitis. He has been on the medication now for 4 days. He states the pain in his left lower quadrant is not getting better but it is also not getting any worse. He denies fevers or chills. He denies nausea or vomiting. However he is having up to 10 bowel movements a day of watery diarrhea. This predates the antibiotics.  He has not been exposed to C. difficile. He has not traveled outside the country.  He has no known sick contacts. He denies melena or hematochezia. The pain in his left lower quadrant isn't constant there are 3-4/10. Past Medical History  Diagnosis Date  . Diverticulosis   . Former smoker   . HLD (hyperlipidemia)   . Allergy   . Melanoma in situ    Current Outpatient Prescriptions on File Prior to Visit  Medication Sig Dispense Refill  . cetirizine (ZYRTEC) 10 MG tablet TAKE 1 TABLET BY MOUTH ONCE A DAY  30 tablet  11  . metroNIDAZOLE (FLAGYL) 500 MG tablet Take 1 tablet (500 mg total) by mouth 3 (three) times daily.  30 tablet  0   No current facility-administered medications on file prior to visit.   Allergies  Allergen Reactions  . Levaquin [Levofloxacin In D5w] Shortness Of Breath   History   Social History  . Marital Status: Married    Spouse Name: N/A    Number of Children: 2  . Years of Education: N/A   Occupational History  . part sales    Social History Main Topics  . Smoking status: Former Smoker -- 1.00 packs/day for 30 years    Types: Cigarettes, Cigars    Quit date: 05/13/1998  . Smokeless tobacco: Current User    Types: Chew  . Alcohol  Use: Yes     Comment: freq  . Drug Use: No  . Sexual Activity: Not on file   Other Topics Concern  . Not on file   Social History Narrative  . No narrative on file      Review of Systems  All other systems reviewed and are negative.       Objective:   Physical Exam  Vitals reviewed. Constitutional: He appears well-developed and well-nourished. No distress.  Neck: Neck supple. No thyromegaly present.  Cardiovascular: Normal rate, regular rhythm and normal heart sounds.  Exam reveals no gallop and no friction rub.   No murmur heard. Pulmonary/Chest: Effort normal and breath sounds normal. No respiratory distress. He has no wheezes. He has no rales. He exhibits no tenderness.  Abdominal: Soft. Bowel sounds are normal. He exhibits no distension and no mass. There is tenderness. There is no rebound and no guarding.  Lymphadenopathy:    He has no cervical adenopathy.  Skin: He is not diaphoretic.   patient is mildly tender to palpation in the left lower quadrant.        Assessment & Plan:  1. Diverticulitis of colon (without mention of hemorrhage) Discontinue Cipro and Flagyl. The patient believes they may be upsetting his stomach making the diarrhea worse. Begin  Augmentin 875 mg one tablet by mouth twice a day for 10 days. Obtain a CBC. There is no evidence bowel perforation or peritoneal abscess on exam today. However if the patient's pain is not better in 48 hours or if his white blood cell count is elevated I would recommend proceeding with a CT scan of the abdomen and pelvis.  Recheck in 48 hours or sooner if worse. - amoxicillin-clavulanate (AUGMENTIN) 875-125 MG per tablet; Take 1 tablet by mouth 2 (two) times daily.  Dispense: 20 tablet; Refill: 0 - CBC with Differential

## 2012-10-07 LAB — CBC WITH DIFFERENTIAL/PLATELET
Basophils Absolute: 0 10*3/uL (ref 0.0–0.1)
Basophils Relative: 0 % (ref 0–1)
Eosinophils Absolute: 0.4 10*3/uL (ref 0.0–0.7)
Eosinophils Relative: 4 % (ref 0–5)
HCT: 44.3 % (ref 39.0–52.0)
Hemoglobin: 14.9 g/dL (ref 13.0–17.0)
Lymphocytes Relative: 25 % (ref 12–46)
Lymphs Abs: 2.4 10*3/uL (ref 0.7–4.0)
MCH: 28.5 pg (ref 26.0–34.0)
MCHC: 33.6 g/dL (ref 30.0–36.0)
MCV: 84.9 fL (ref 78.0–100.0)
Monocytes Absolute: 0.9 10*3/uL (ref 0.1–1.0)
Monocytes Relative: 9 % (ref 3–12)
Neutro Abs: 6 10*3/uL (ref 1.7–7.7)
Neutrophils Relative %: 62 % (ref 43–77)
Platelets: 308 10*3/uL (ref 150–400)
RBC: 5.22 MIL/uL (ref 4.22–5.81)
RDW: 13.6 % (ref 11.5–15.5)
WBC: 9.7 10*3/uL (ref 4.0–10.5)

## 2012-11-26 ENCOUNTER — Other Ambulatory Visit: Payer: Self-pay

## 2012-12-16 ENCOUNTER — Ambulatory Visit (INDEPENDENT_AMBULATORY_CARE_PROVIDER_SITE_OTHER): Payer: 59 | Admitting: Family Medicine

## 2012-12-16 DIAGNOSIS — Z23 Encounter for immunization: Secondary | ICD-10-CM

## 2013-02-23 ENCOUNTER — Ambulatory Visit (INDEPENDENT_AMBULATORY_CARE_PROVIDER_SITE_OTHER): Payer: 59 | Admitting: Family Medicine

## 2013-02-23 ENCOUNTER — Encounter: Payer: Self-pay | Admitting: Family Medicine

## 2013-02-23 VITALS — BP 110/80 | HR 80 | Temp 97.2°F | Resp 18 | Ht 72.0 in | Wt 228.0 lb

## 2013-02-23 DIAGNOSIS — J019 Acute sinusitis, unspecified: Secondary | ICD-10-CM

## 2013-02-23 MED ORDER — CEFDINIR 300 MG PO CAPS: 300.0000 mg | ORAL_CAPSULE | Freq: Two times a day (BID) | ORAL | Status: DC

## 2013-02-23 NOTE — Progress Notes (Signed)
   Subjective:    Patient ID: [name], male    DOB: [birth year], 63 y.o.   MRN: [id number]  HPI Patient reports 2 days of sinus pressure, sinus pain, congestion, postnasal drip, and hoarseness. He also has a cough productive of clear and yellow sputum. He denies any chest pain, shortness of breath, fevers, chills. Past Medical History  Diagnosis Date  . Diverticulosis   . Former smoker   . HLD (hyperlipidemia)   . Allergy   . Melanoma in situ    Current Outpatient Prescriptions on File Prior to Visit  Medication Sig Dispense Refill  . cetirizine (ZYRTEC) 10 MG tablet TAKE 1 TABLET BY MOUTH ONCE A DAY  30 tablet  11   No current facility-administered medications on file prior to visit.   Allergies  Allergen Reactions  . Levaquin [Levofloxacin In D5w] Shortness Of Breath   History   Social History  . Marital Status: Married    Spouse Name: N/A    Number of Children: 2  . Years of Education: N/A   Occupational History  . part sales    Social History Main Topics  . Smoking status: Former Smoker -- 1.00 packs/day for 30 years    Types: Cigarettes, Cigars    Quit date: 05/13/1998  . Smokeless tobacco: Current User    Types: Chew  . Alcohol Use: Yes     Comment: freq  . Drug Use: No  . Sexual Activity: Not on file   Other Topics Concern  . Not on file   Social History Narrative  . No narrative on file      Review of Systems  All other systems reviewed and are negative.       Objective:   Physical Exam  Vitals reviewed. Constitutional: He appears well-developed and well-nourished. No distress.  HENT:  Right Ear: Tympanic membrane, external ear and ear canal normal.  Left Ear: Tympanic membrane, external ear and ear canal normal.  Nose: Mucosal edema and rhinorrhea present. Right sinus exhibits no maxillary sinus tenderness and no frontal sinus tenderness. Left sinus exhibits no maxillary sinus tenderness and no frontal sinus tenderness.  Mouth/Throat:  Uvula is midline, oropharynx is clear and moist and mucous membranes are normal. No oropharyngeal exudate.  Neck: Neck supple. No JVD present. No thyromegaly present.  Cardiovascular: Normal rate, regular rhythm and normal heart sounds.   No murmur heard. Pulmonary/Chest: Effort normal and breath sounds normal. No respiratory distress. He has no wheezes. He has no rales.  Lymphadenopathy:    He has no cervical adenopathy.  Skin: He is not diaphoretic.          Assessment & Plan:  1. Acute rhinosinusitis I explained to the patient that I believe he has a sinus infection. However 90% of sinus infections or viral. This should resolve spontaneously on its on within 10 days. I recommended Sudafed as needed for congestion, and Mucinex as needed for cough and congestion, and ibuprofen as needed for headache or pain. I did give the patient prescription for Omnicef 300 mg by mouth twice a day for 10 days with strict instructions not to feel less his symptoms last longer than 10 days or he develops purulent nasal discharge and high fever. - cefdinir (OMNICEF) 300 MG capsule; Take 1 capsule (300 mg total) by mouth 2 (two) times daily.  Dispense: 20 capsule; Refill: 0

## 2013-04-02 ENCOUNTER — Telehealth: Payer: Self-pay | Admitting: Family Medicine

## 2013-04-02 DIAGNOSIS — J019 Acute sinusitis, unspecified: Secondary | ICD-10-CM

## 2013-04-02 MED ORDER — CEFDINIR 300 MG PO CAPS: 300.0000 mg | ORAL_CAPSULE | Freq: Two times a day (BID) | ORAL | Status: DC

## 2013-04-02 NOTE — Telephone Encounter (Signed)
Call back number is [phone] Pt was here a month or so ago for sinus infection and he has it again and he is wanting that same medication called in because it helped him. He has same symptoms as last time Pharmacy [facility] [place] --please call pt to make aware if you call something in

## 2013-04-02 NOTE — Telephone Encounter (Signed)
Patient returned call and made aware.

## 2013-04-02 NOTE — Telephone Encounter (Signed)
Please call in omnicef 300 bid for 10 days

## 2013-04-02 NOTE — Telephone Encounter (Signed)
[facility].  Prescription sent to pharmacy.

## 2013-05-18 ENCOUNTER — Ambulatory Visit (INDEPENDENT_AMBULATORY_CARE_PROVIDER_SITE_OTHER): Payer: 59 | Admitting: Family Medicine

## 2013-05-18 ENCOUNTER — Encounter: Payer: Self-pay | Admitting: Family Medicine

## 2013-05-18 VITALS — BP 120/72 | HR 78 | Temp 97.4°F | Resp 18 | Ht 72.0 in | Wt 224.0 lb

## 2013-05-18 DIAGNOSIS — Z23 Encounter for immunization: Secondary | ICD-10-CM

## 2013-05-18 DIAGNOSIS — K5732 Diverticulitis of large intestine without perforation or abscess without bleeding: Secondary | ICD-10-CM

## 2013-05-18 MED ORDER — DESONIDE 0.05 % EX CREA: TOPICAL_CREAM | Freq: Two times a day (BID) | CUTANEOUS | Status: DC

## 2013-05-18 MED ORDER — METRONIDAZOLE 500 MG PO TABS: 500.0000 mg | ORAL_TABLET | Freq: Two times a day (BID) | ORAL | Status: DC

## 2013-05-18 MED ORDER — CIPROFLOXACIN HCL 500 MG PO TABS: 500.0000 mg | ORAL_TABLET | Freq: Two times a day (BID) | ORAL | Status: DC

## 2013-05-18 NOTE — Progress Notes (Signed)
   Subjective:    Patient ID: [name], male    DOB: 1951/01/16, 63 y.o.   MRN: [id number]  HPI Patient presents with a one-month history of left lower quadrant abdominal pain. The pain is sharp and constant in nature. It has been getting gradually worse.  He denies any constipation, melena, hematochezia. He is 5 bowel movements a day which are formed and regular. He denies any hematuria or dysuria. He denies any exacerbation of pain with movement. No visible abnormalities in the abdomen such as a hernia. He is also requesting a refill on Desowen which he uses for dyshidrotic eczema. Past Medical History  Diagnosis Date  . Diverticulosis   . Former smoker   . HLD (hyperlipidemia)   . Allergy   . Melanoma in situ    No past surgical history on file. Current Outpatient Prescriptions on File Prior to Visit  Medication Sig Dispense Refill  . cetirizine (ZYRTEC) 10 MG tablet TAKE 1 TABLET BY MOUTH ONCE A DAY  30 tablet  11   No current facility-administered medications on file prior to visit.   Allergies  Allergen Reactions  . Levaquin [Levofloxacin In D5w] Shortness Of Breath   History   Social History  . Marital Status: Married    Spouse Name: N/A    Number of Children: 2  . Years of Education: N/A   Occupational History  . part sales    Social History Main Topics  . Smoking status: Former Smoker -- 1.00 packs/day for 30 years    Types: Cigarettes, Cigars    Quit date: 05/13/1998  . Smokeless tobacco: Current User    Types: Chew  . Alcohol Use: Yes     Comment: freq  . Drug Use: No  . Sexual Activity: Not on file   Other Topics Concern  . Not on file   Social History Narrative  . No narrative on file      Review of Systems  All other systems reviewed and are negative.      Objective:   Physical Exam  Vitals reviewed. Cardiovascular: Normal rate and regular rhythm.   Pulmonary/Chest: Effort normal and breath sounds normal. No respiratory distress. He  has no wheezes. He has no rales.  Abdominal: Soft. He exhibits no distension. There is tenderness. There is no rebound and no guarding.          Assessment & Plan:  1. Diverticulitis of colon without hemorrhage Begin Cipro 500 mg by mouth twice a day for 10 days and Flagyl 500 mg by mouth twice a day for 10 days. The patient's pain is not improving within 48-72 hours I would proceed with a CT scan of the abdomen and pelvis to rule out other causes of lower quadrant abdominal pain.  Return immediately if worse. - ciprofloxacin (CIPRO) 500 MG tablet; Take 1 tablet (500 mg total) by mouth 2 (two) times daily.  Dispense: 20 tablet; Refill: 0 - metroNIDAZOLE (FLAGYL) 500 MG tablet; Take 1 tablet (500 mg total) by mouth 2 (two) times daily.  Dispense: 20 tablet; Refill: 0

## 2013-05-18 NOTE — Addendum Note (Signed)
Addended by: Shary Decamp B on: 05/18/2013 04:11 PM   Modules accepted: Orders

## 2013-05-19 ENCOUNTER — Telehealth: Payer: Self-pay | Admitting: Family Medicine

## 2013-05-19 NOTE — Telephone Encounter (Signed)
Pt states that medication that you gave him yesterday Desowen is a $75.00 copay and was wondering if there was something cheaper that you could call in?

## 2013-05-19 NOTE — Telephone Encounter (Signed)
Message copied by Alyson Locket on Wed May 19, 2013  3:44 PM ------      Message from: Devoria Glassing      Created: Wed May 19, 2013 11:07 AM       Patient is calling to speak to you about one of the prescriptions we gave him yesterday, was not specific on the message about what med please call him at [phone] ------

## 2013-05-20 MED ORDER — TRIAMCINOLONE 0.1 % CREAM:EUCERIN CREAM 1:1: 1.0000 "application " | TOPICAL_CREAM | Freq: Two times a day (BID) | CUTANEOUS | Status: DC

## 2013-05-20 NOTE — Telephone Encounter (Signed)
Switch to TAC 0.1% cream

## 2013-05-20 NOTE — Telephone Encounter (Signed)
Med sent in and pt aware 

## 2013-05-21 ENCOUNTER — Encounter: Payer: Self-pay | Admitting: Family Medicine

## 2013-05-24 ENCOUNTER — Telehealth: Payer: Self-pay | Admitting: Family Medicine

## 2013-05-24 DIAGNOSIS — R1032 Left lower quadrant pain: Secondary | ICD-10-CM

## 2013-05-24 NOTE — Telephone Encounter (Signed)
Pt calls and states that he is no better and still having pain what to do??   Per Dr. Dennard Schaumann - Needs CT scan of ABD/Pelvis  - Pt aware and Ordered CT

## 2013-05-31 ENCOUNTER — Ambulatory Visit
Admission: RE | Admit: 2013-05-31 | Discharge: 2013-05-31 | Disposition: A | Discharge status: Home / Self Care | Payer: 59 | Source: Ambulatory Visit | Attending: Family Medicine | Admitting: Family Medicine

## 2013-05-31 DIAGNOSIS — R1032 Left lower quadrant pain: Secondary | ICD-10-CM

## 2013-06-01 ENCOUNTER — Telehealth: Payer: Self-pay | Admitting: Family Medicine

## 2013-06-01 DIAGNOSIS — R1032 Left lower quadrant pain: Secondary | ICD-10-CM

## 2013-06-01 NOTE — Telephone Encounter (Signed)
Message copied by Olena Mater on Tue Jun 01, 2013 10:09 AM ------      Message from: [name]      Created: Mon May 31, 2013  3:36 PM       CT is normal.  No explanation for LLQ pain.  Suggest GI consult if pain is persistent. ------

## 2013-06-01 NOTE — Telephone Encounter (Signed)
Spoke to patient about normal CT result.  Still having LLQ pain.  GI referral initiated

## 2013-06-08 ENCOUNTER — Telehealth: Payer: Self-pay | Admitting: Gastroenterology

## 2013-06-08 NOTE — Telephone Encounter (Signed)
Pt states he has been having LLQ abdominal pain for about a month now. States he has been having this for about a month now and it is a nagging pain. Requests to be seen sooner than 1st available. Pt scheduled to see Alonza Bogus PA 06/10/13@3pm . Pt aware of appt.

## 2013-06-10 ENCOUNTER — Ambulatory Visit (INDEPENDENT_AMBULATORY_CARE_PROVIDER_SITE_OTHER): Payer: 59 | Admitting: Gastroenterology

## 2013-06-10 ENCOUNTER — Encounter: Payer: Self-pay | Admitting: *Deleted

## 2013-06-10 ENCOUNTER — Ambulatory Visit: Payer: 59 | Admitting: Gastroenterology

## 2013-06-10 VITALS — BP 124/80 | HR 72 | Ht 71.0 in | Wt 218.8 lb

## 2013-06-10 DIAGNOSIS — R1032 Left lower quadrant pain: Secondary | ICD-10-CM | POA: Insufficient documentation

## 2013-06-10 MED ORDER — METRONIDAZOLE 500 MG PO TABS: 500.0000 mg | ORAL_TABLET | Freq: Three times a day (TID) | ORAL | Status: DC

## 2013-06-10 MED ORDER — CIPROFLOXACIN HCL 500 MG PO TABS: 500.0000 mg | ORAL_TABLET | Freq: Two times a day (BID) | ORAL | Status: DC

## 2013-06-10 NOTE — Progress Notes (Signed)
I recommend trial of hyomax 0.375 mg twice a day for 5 days, then as needed.  If he's improved then I would limit antibiotic therapy to another 2 weeks only. Sandy Salaam. Deatra Ina, M.D., [facility]

## 2013-06-10 NOTE — Patient Instructions (Signed)
We have sent the following medications to your pharmacy for you to pick up at your convenience: Cipro 500 mg, please take one tablet by mouth twice daily for fourteen days Flagyl 500 mg, please take one tablet by mouth three times daily for fourteen days

## 2013-06-10 NOTE — Progress Notes (Signed)
06/10/2013 [name] [id number] Jun 18, 1950   HISTORY OF PRESENT ILLNESS:  This is a pleasant 63 year old male who is previously known to Dr. Deatra Ina for colonoscopy in October 2011 at which time he was found to have only moderate diverticulosis in the sigmoid colon and a repeat was recommended in 10 years from that time. He presents to our office today at the request of his PCP, Dr. Dennard Schaumann, with complaints of left lower quadrant abdominal pain that began 2 months ago. He states that it has been constant discomfort, but worsens at times even reaching a 5-6/10 on the pain scale. He feels it worsens with eating and he's really had a poor appetite since the pain began. He saw his PCP who placed him on Cipro 500 mg twice daily and Flagyl 500 mg twice daily for one week. CT scan of the abdomen and pelvis with contrast on May 11 did not show any sign of diverticulitis or other pain causing abnormalities. He completed th seven-day course of antibiotics, however, with no real improvement in his symptoms.  He says that it does not feel muscular in origin.  Pain does not wake him from sleep at night.  No nausea, vomiting, fevers, or chills.  Stools have been somewhat softer than usual.  Denies seeing any blood in his stools.   Past Medical History  Diagnosis Date  . Diverticulosis 2011  . Former smoker   . HLD (hyperlipidemia)   . Allergy   . Melanoma in situ   . Adenomatous colon polyp 2004  . Hyperplastic colon polyp 2004   Past Surgical History  Procedure Laterality Date  . Colonoscopy  2011    SEVERAL     reports that he quit smoking about 15 years ago. His smoking use included Cigarettes and Cigars. He has a 30 pack-year smoking history. His smokeless tobacco use includes Chew. He reports that he drinks alcohol. He reports that he does not use illicit drugs. family history includes Diabetes in his daughter; Heart disease in his mother; Lung cancer in his father. Allergies  Allergen Reactions   . Levaquin [Levofloxacin In D5w] Shortness Of Breath      Outpatient Encounter Prescriptions as of 06/10/2013  Medication Sig  . cetirizine (ZYRTEC) 10 MG tablet TAKE 1 TABLET BY MOUTH ONCE A DAY  . desonide (DESOWEN) 0.05 % cream Apply topically 2 (two) times daily.  . ciprofloxacin (CIPRO) 500 MG tablet Take 1 tablet (500 mg total) by mouth 2 (two) times daily.  . metroNIDAZOLE (FLAGYL) 500 MG tablet Take 1 tablet (500 mg total) by mouth 3 (three) times daily.  . [DISCONTINUED] ciprofloxacin (CIPRO) 500 MG tablet Take 1 tablet (500 mg total) by mouth 2 (two) times daily.  . [DISCONTINUED] metroNIDAZOLE (FLAGYL) 500 MG tablet Take 1 tablet (500 mg total) by mouth 2 (two) times daily.  . [DISCONTINUED] metroNIDAZOLE (FLAGYL) 500 MG tablet Take 1 tablet (500 mg total) by mouth 3 (three) times daily.  . [DISCONTINUED] Triamcinolone Acetonide (TRIAMCINOLONE 0.1 % CREAM : EUCERIN) CREA Apply 1 application topically 2 (two) times daily.     REVIEW OF SYSTEMS  : All other systems reviewed and negative except where noted in the History of Present Illness.   PHYSICAL EXAM: BP 124/80  Pulse 72  Ht 5\' 11"  (1.803 m)  Wt 218 lb 12.8 oz (99.247 kg)  BMI 30.53 kg/m2 General: Well developed white male in no acute distress Head: Normocephalic and atraumatic Eyes:  Sclerae anicteric, conjunctiva pink. Ears: Normal auditory  acuity  Lungs: Clear throughout to auscultation Heart: Regular rate and rhythm Abdomen: Soft, non-distended.  Normal bowel sounds.  LLQ TTP without R/R/G. Musculoskeletal: Symmetrical with no gross deformities  Skin: No lesions on visible extremities Extremities: No edema  Neurological: Alert oriented x 4, grossly non-focal Psychological:  Alert and cooperative. Normal mood and affect  ASSESSMENT AND PLAN: -LLQ abdominal pain:  Sounds like diverticulitis, however, CT scan did not show any evidence of this.  He was treated with Cipro 500 mg twice daily and Flagyl 500 mg  twice daily for 7 days with no improvement in his symptoms. Unsure of any other source of pain. Will treat empirically again with Cipro 500 mg twice daily and Flagyl 500 mg 3 times daily for to 4 weeks. If no improvement, patient will call back for further recommendations.

## 2013-06-25 ENCOUNTER — Encounter: Payer: Self-pay | Admitting: Family Medicine

## 2013-06-25 ENCOUNTER — Ambulatory Visit (INDEPENDENT_AMBULATORY_CARE_PROVIDER_SITE_OTHER): Payer: 59 | Admitting: Family Medicine

## 2013-06-25 VITALS — BP 110/76 | HR 74 | Temp 98.7°F | Resp 16 | Ht 72.0 in | Wt 213.0 lb

## 2013-06-25 DIAGNOSIS — Z0000 Encounter for general adult medical examination without abnormal findings: Secondary | ICD-10-CM

## 2013-06-25 DIAGNOSIS — Z79899 Other long term (current) drug therapy: Secondary | ICD-10-CM

## 2013-06-25 LAB — CBC WITH DIFFERENTIAL/PLATELET
Basophils Absolute: 0.1 10*3/uL (ref 0.0–0.1)
Basophils Relative: 1 % (ref 0–1)
Eosinophils Absolute: 0.3 10*3/uL (ref 0.0–0.7)
Eosinophils Relative: 4 % (ref 0–5)
HCT: 46.2 % (ref 39.0–52.0)
Hemoglobin: 15.8 g/dL (ref 13.0–17.0)
Lymphocytes Relative: 23 % (ref 12–46)
Lymphs Abs: 1.8 10*3/uL (ref 0.7–4.0)
MCH: 28.6 pg (ref 26.0–34.0)
MCHC: 34.2 g/dL (ref 30.0–36.0)
MCV: 83.7 fL (ref 78.0–100.0)
Monocytes Absolute: 0.8 10*3/uL (ref 0.1–1.0)
Monocytes Relative: 10 % (ref 3–12)
Neutro Abs: 4.9 10*3/uL (ref 1.7–7.7)
Neutrophils Relative %: 62 % (ref 43–77)
Platelets: 313 10*3/uL (ref 150–400)
RBC: 5.52 MIL/uL (ref 4.22–5.81)
RDW: 14 % (ref 11.5–15.5)
WBC: 7.9 10*3/uL (ref 4.0–10.5)

## 2013-06-25 LAB — COMPLETE METABOLIC PANEL WITH GFR
ALT: 35 U/L (ref 0–53)
AST: 29 U/L (ref 0–37)
Albumin: 4.2 g/dL (ref 3.5–5.2)
Alkaline Phosphatase: 52 U/L (ref 39–117)
BUN: 12 mg/dL (ref 6–23)
CO2: 24 mEq/L (ref 19–32)
Calcium: 9.4 mg/dL (ref 8.4–10.5)
Chloride: 103 mEq/L (ref 96–112)
Creat: 1.18 mg/dL (ref 0.50–1.35)
GFR, Est African American: 75 mL/min
GFR, Est Non African American: 65 mL/min
Glucose, Bld: 90 mg/dL (ref 70–99)
Potassium: 4.7 mEq/L (ref 3.5–5.3)
Sodium: 142 mEq/L (ref 135–145)
Total Bilirubin: 0.7 mg/dL (ref 0.2–1.2)
Total Protein: 6.4 g/dL (ref 6.0–8.3)

## 2013-06-25 LAB — LIPID PANEL
Cholesterol: 158 mg/dL (ref 0–200)
HDL: 36 mg/dL — ABNORMAL LOW (ref >39)
LDL Cholesterol: 101 mg/dL — ABNORMAL HIGH (ref 0–99)
Total CHOL/HDL Ratio: 4.4 Ratio
Triglycerides: 107 mg/dL (ref <150)
VLDL: 21 mg/dL (ref 0–40)

## 2013-06-25 MED ORDER — PROMETHAZINE HCL 25 MG PO TABS: 25.0000 mg | ORAL_TABLET | Freq: Three times a day (TID) | ORAL | Status: DC | PRN

## 2013-06-25 NOTE — Progress Notes (Signed)
Subjective:    Patient ID: [name], male    DOB: 12/12/1950, 63 y.o.   MRN: [id number]  HPI Patient is here for CPE.  Last colonosocpy was 2011.  Due for prostate exam.  Received pneumovax 2014.  Tdap received 2015.  Not due for prevnar until after 63.   I last saw the patient in April and diagnosed him with left lower quadrant pain due to diverticulitis. I placed him on 10 days of Cipro and Flagyl. Symptoms did not improve. I obtained a CT scan abdomen and pelvis which will no acute abnormalities. For the patient to gastroenterology where they put him on an additional 14 days of Cipro and Flagyl for presumed diverticulitis. The patient has completed 1 week of antibiotics with no benefit. He continues to have persistent left lower quadrant abdominal pain. He also reports frequent gas and bloating and diarrhea. Patient also has symptoms consistent with depression. Please see his depression screening. He states symptoms stem from financial troubles that he and his wife are having. At the present time he is not interested in treatment. Past Medical History  Diagnosis Date  . Diverticulosis 2011  . Former smoker   . HLD (hyperlipidemia)   . Allergy   . Melanoma in situ   . Adenomatous colon polyp 2004  . Hyperplastic colon polyp 2004   Past Surgical History  Procedure Laterality Date  . Colonoscopy  2011    SEVERAL    Current Outpatient Prescriptions on File Prior to Visit  Medication Sig Dispense Refill  . cetirizine (ZYRTEC) 10 MG tablet TAKE 1 TABLET BY MOUTH ONCE A DAY  30 tablet  11  . ciprofloxacin (CIPRO) 500 MG tablet Take 1 tablet (500 mg total) by mouth 2 (two) times daily.  28 tablet  0  . desonide (DESOWEN) 0.05 % cream Apply topically 2 (two) times daily.  30 g  0  . metroNIDAZOLE (FLAGYL) 500 MG tablet Take 1 tablet (500 mg total) by mouth 3 (three) times daily.  42 tablet  0   No current facility-administered medications on file prior to visit.   Allergies  Allergen  Reactions  . Levaquin [Levofloxacin In D5w] Shortness Of Breath   History   Social History  . Marital Status: Married    Spouse Name: N/A    Number of Children: 2  . Years of Education: N/A   Occupational History  . part sales    Social History Main Topics  . Smoking status: Former Smoker -- 1.00 packs/day for 30 years    Types: Cigarettes, Cigars    Quit date: 05/13/1998  . Smokeless tobacco: Current User    Types: Chew  . Alcohol Use: Yes     Comment: freq  . Drug Use: No  . Sexual Activity: Not on file   Other Topics Concern  . Not on file   Social History Narrative  . No narrative on file   Family History  Problem Relation Age of Onset  . Lung cancer Father   . Heart disease Mother     died in her 77's  . Diabetes Daughter        Review of Systems  All other systems reviewed and are negative.      Objective:   Physical Exam  Vitals reviewed. Constitutional: He is oriented to person, place, and time. He appears well-developed and well-nourished. No distress.  HENT:  Head: Normocephalic and atraumatic.  Right Ear: External ear normal.  Left Ear: External  ear normal.  Nose: Nose normal.  Mouth/Throat: Oropharynx is clear and moist. No oropharyngeal exudate.  Eyes: Conjunctivae and EOM are normal. Pupils are equal, round, and reactive to light. Right eye exhibits no discharge. Left eye exhibits no discharge. No scleral icterus.  Neck: Normal range of motion. Neck supple. No JVD present. No tracheal deviation present. No thyromegaly present.  Cardiovascular: Normal rate, regular rhythm, normal heart sounds and intact distal pulses.  Exam reveals no gallop and no friction rub.   No murmur heard. Pulmonary/Chest: Effort normal and breath sounds normal. No stridor. No respiratory distress. He has no wheezes. He has no rales. He exhibits no tenderness.  Abdominal: Soft. Bowel sounds are normal. He exhibits no distension and no mass. There is no tenderness.  There is no rebound and no guarding.  Genitourinary: Rectum normal and prostate normal.  Musculoskeletal: Normal range of motion. He exhibits no edema and no tenderness.  Lymphadenopathy:    He has no cervical adenopathy.  Neurological: He is alert and oriented to person, place, and time. He has normal reflexes. He displays normal reflexes. No cranial nerve deficit. He exhibits normal muscle tone. Coordination normal.  Skin: Skin is warm. No rash noted. He is not diaphoretic. No erythema. No pallor.  Psychiatric: He has a normal mood and affect. His behavior is normal. Judgment and thought content normal.          Assessment & Plan:  1. Routine general medical examination at a health care facility Remainder of his physical exam is normal. His cancer screening is up to date. I will check a CBC CMP fasting lipid panel and PSA.  After the patient Lexapro 10 mg by mouth daily for depression. At the present time he is not interested in medication but stated he would call me back if he changes his mind. - CBC with Differential - COMPLETE METABOLIC PANEL WITH GFR - Lipid panel - PSA  2. Encounter for long-term (current) use of other medications I recommended the patient add restora 1 poqday for abdominal pain bloating and diarrhea. If symptoms persist after he completes antibiotics, I would like him to proceed with a colonoscopy. Previously he had diverticulitis that was not seen on the CT scan and was found on colonoscopy. Colonoscopy shows persistent diverticulitis, the patient may be consider surgical resection of the infected portion of the colon.

## 2013-06-26 LAB — PSA: PSA: 1.16 ng/mL (ref <=4.00)

## 2013-07-01 ENCOUNTER — Encounter: Payer: Self-pay | Admitting: Family Medicine

## 2013-08-23 ENCOUNTER — Telehealth: Payer: Self-pay | Admitting: Family Medicine

## 2013-08-23 NOTE — Telephone Encounter (Signed)
[name] is pharmacy  Patient said he talked with dr pickard last visit about depression, and doc told him if he started feeling depressed again he would call him in some medication, he is feeling depressed and would like something called in if possible  [phone] with questions

## 2013-08-24 ENCOUNTER — Other Ambulatory Visit: Payer: Self-pay | Admitting: Family Medicine

## 2013-08-24 MED ORDER — VENLAFAXINE HCL ER 75 MG PO CP24: ORAL_CAPSULE | ORAL | Status: DC

## 2013-08-24 NOTE — Telephone Encounter (Signed)
Med sent to pharm and pt aware 

## 2013-08-24 NOTE — Telephone Encounter (Signed)
I will call out effexor xr 75 mg poqam for 1 week then increase to 150 mg poqam thereafter.  Recheck here in 4-6 weeks.  Do not expect improvement for at least 4 weeks.

## 2013-10-16 ENCOUNTER — Encounter: Payer: Self-pay | Admitting: Gastroenterology

## 2014-01-12 ENCOUNTER — Other Ambulatory Visit: Payer: Self-pay | Admitting: Family Medicine

## 2014-04-25 ENCOUNTER — Telehealth: Payer: Self-pay | Admitting: Family Medicine

## 2014-04-25 MED ORDER — FLUTICASONE PROPIONATE 50 MCG/ACT NA SUSP: 2.0000 | Freq: Every day | NASAL | Status: DC

## 2014-04-25 MED ORDER — CETIRIZINE HCL 10 MG PO TABS: 10.0000 mg | ORAL_TABLET | Freq: Every day | ORAL | Status: DC

## 2014-04-25 NOTE — Telephone Encounter (Signed)
meds sent to pharm and pt aware

## 2014-04-25 NOTE — Telephone Encounter (Signed)
[phone] Pt is having allergie problems and would like to have something called in

## 2014-04-25 NOTE — Telephone Encounter (Signed)
If he is not on anything, I will start the patient on Flonase 2 sprays each nostril daily and supplement with Zyrtec 10 mg by mouth daily

## 2014-04-28 ENCOUNTER — Encounter: Payer: Self-pay | Admitting: Family Medicine

## 2014-04-28 ENCOUNTER — Ambulatory Visit (INDEPENDENT_AMBULATORY_CARE_PROVIDER_SITE_OTHER): Payer: PPO | Admitting: Family Medicine

## 2014-04-28 VITALS — BP 120/68 | HR 82 | Temp 97.9°F | Resp 16 | Ht 72.0 in | Wt 225.0 lb

## 2014-04-28 DIAGNOSIS — M25561 Pain in right knee: Secondary | ICD-10-CM | POA: Diagnosis not present

## 2014-04-28 DIAGNOSIS — M25562 Pain in left knee: Secondary | ICD-10-CM

## 2014-04-28 DIAGNOSIS — G8929 Other chronic pain: Secondary | ICD-10-CM

## 2014-04-29 ENCOUNTER — Encounter: Payer: Self-pay | Admitting: Family Medicine

## 2014-04-29 NOTE — Progress Notes (Signed)
Subjective:    Patient ID: [name], male    DOB: January 07, 1951, 64 y.o.   MRN: [id number]  HPI Presents with a gradual onset of bilateral knee pain steadily worsening over the last year. He is tried over-the-counter arthritis medicines on occasional basis without relief. He complains of moderate to severe pain with ambulation and going up and down steps. The pain prevents him from enjoying life. He is unable to do the things that he once enjoyed doing such as playing with his grandkids. He reports crepitus. The knee pain at night keeps him from sleeping. He denies any specific injury.  Past Medical History  Diagnosis Date  . Diverticulosis 2011  . Former smoker   . HLD (hyperlipidemia)   . Allergy   . Melanoma in situ   . Adenomatous colon polyp 2004  . Hyperplastic colon polyp 2004   Past Surgical History  Procedure Laterality Date  . Colonoscopy  2011    SEVERAL    Current Outpatient Prescriptions on File Prior to Visit  Medication Sig Dispense Refill  . cetirizine (ZYRTEC) 10 MG tablet Take 1 tablet (10 mg total) by mouth daily. 30 tablet 11  . desonide (DESOWEN) 0.05 % cream Apply topically 2 (two) times daily. 30 g 0  . fluticasone (FLONASE) 50 MCG/ACT nasal spray Place 2 sprays into both nostrils daily. 16 g 6  . metroNIDAZOLE (FLAGYL) 500 MG tablet Take 1 tablet (500 mg total) by mouth 3 (three) times daily. 42 tablet 0  . venlafaxine XR (EFFEXOR-XR) 75 MG 24 hr capsule Take 2 capsules (150 mg total) by mouth daily with breakfast. 60 capsule 3   No current facility-administered medications on file prior to visit.   Allergies  Allergen Reactions  . Levaquin [Levofloxacin In D5w] Shortness Of Breath   History   Social History  . Marital Status: Married    Spouse Name: N/A  . Number of Children: 2  . Years of Education: N/A   Occupational History  . part sales    Social History Main Topics  . Smoking status: Former Smoker -- 1.00 packs/day for 30 years   Types: Cigarettes, Cigars    Quit date: 05/13/1998  . Smokeless tobacco: Current User    Types: Chew  . Alcohol Use: Yes     Comment: freq  . Drug Use: No  . Sexual Activity: Not on file   Other Topics Concern  . Not on file   Social History Narrative      Review of Systems  Musculoskeletal: Positive for joint swelling and arthralgias.       Objective:   Physical Exam  Cardiovascular: Normal rate, regular rhythm and normal heart sounds.   Pulmonary/Chest: Effort normal and breath sounds normal. No respiratory distress. He has no wheezes. He has no rales.  Musculoskeletal:       Right knee: He exhibits decreased range of motion. He exhibits no swelling, no effusion, no LCL laxity, normal patellar mobility, normal meniscus and no MCL laxity. Tenderness found. Medial joint line and lateral joint line tenderness noted. No MCL and no LCL tenderness noted.       Left knee: He exhibits decreased range of motion. He exhibits no swelling, no effusion, no erythema, no LCL laxity, normal patellar mobility, no bony tenderness, normal meniscus and no MCL laxity. Tenderness found. Medial joint line and lateral joint line tenderness noted. No MCL and no LCL tenderness noted.  Vitals reviewed.  Assessment & Plan:  Bilateral chronic knee pain  I suspect osteoarthritis. Patient would like to proceed with cortisone injections in both knees. Using sterile technique, I injected each knee with a mixture of 2 mL of lidocaine, 2 mL of Marcaine, and 2 mL of 40 mg per mL Kenalog. The patient tolerated each of the 2 injections without complication. Recheck if no better in 2-3 weeks.  Would proceed with xrays of both knees if pain persists.

## 2014-06-27 ENCOUNTER — Other Ambulatory Visit: Payer: PPO

## 2014-06-27 ENCOUNTER — Other Ambulatory Visit: Payer: Self-pay | Admitting: Family Medicine

## 2014-06-27 DIAGNOSIS — I10 Essential (primary) hypertension: Secondary | ICD-10-CM

## 2014-06-27 DIAGNOSIS — Z79899 Other long term (current) drug therapy: Secondary | ICD-10-CM

## 2014-06-27 DIAGNOSIS — Z0000 Encounter for general adult medical examination without abnormal findings: Secondary | ICD-10-CM

## 2014-06-27 DIAGNOSIS — E785 Hyperlipidemia, unspecified: Secondary | ICD-10-CM

## 2014-06-27 LAB — CBC WITH DIFFERENTIAL/PLATELET
Basophils Absolute: 0 10*3/uL (ref 0.0–0.1)
Basophils Relative: 0 % (ref 0–1)
Eosinophils Absolute: 0 10*3/uL (ref 0.0–0.7)
Eosinophils Relative: 0 % (ref 0–5)
HCT: 42.5 % (ref 39.0–52.0)
Hemoglobin: 14.4 g/dL (ref 13.0–17.0)
Lymphocytes Relative: 32 % (ref 12–46)
Lymphs Abs: 3.1 10*3/uL (ref 0.7–4.0)
MCH: 29 pg (ref 26.0–34.0)
MCHC: 33.9 g/dL (ref 30.0–36.0)
MCV: 85.5 fL (ref 78.0–100.0)
MPV: 9.7 fL (ref 8.6–12.4)
Monocytes Absolute: 1 10*3/uL (ref 0.1–1.0)
Monocytes Relative: 10 % (ref 3–12)
Neutro Abs: 5.7 10*3/uL (ref 1.7–7.7)
Neutrophils Relative %: 58 % (ref 43–77)
Platelets: 302 10*3/uL (ref 150–400)
RBC: 4.97 MIL/uL (ref 4.22–5.81)
RDW: 14.4 % (ref 11.5–15.5)
WBC: 9.8 10*3/uL (ref 4.0–10.5)

## 2014-06-27 LAB — COMPLETE METABOLIC PANEL WITH GFR
ALT: 19 U/L (ref 0–53)
AST: 17 U/L (ref 0–37)
Albumin: 3.5 g/dL (ref 3.5–5.2)
Alkaline Phosphatase: 61 U/L (ref 39–117)
BUN: 19 mg/dL (ref 6–23)
CO2: 27 mEq/L (ref 19–32)
Calcium: 8.9 mg/dL (ref 8.4–10.5)
Chloride: 104 mEq/L (ref 96–112)
Creat: 1.08 mg/dL (ref 0.50–1.35)
GFR, Est African American: 83 mL/min
GFR, Est Non African American: 72 mL/min
Glucose, Bld: 93 mg/dL (ref 70–99)
Potassium: 4.5 mEq/L (ref 3.5–5.3)
Sodium: 138 mEq/L (ref 135–145)
Total Bilirubin: 0.5 mg/dL (ref 0.2–1.2)
Total Protein: 6 g/dL (ref 6.0–8.3)

## 2014-06-27 LAB — LIPID PANEL
Cholesterol: 200 mg/dL (ref 0–200)
HDL: 50 mg/dL (ref >=40)
LDL Cholesterol: 135 mg/dL — ABNORMAL HIGH (ref 0–99)
Total CHOL/HDL Ratio: 4 Ratio
Triglycerides: 73 mg/dL (ref <150)
VLDL: 15 mg/dL (ref 0–40)

## 2014-06-28 ENCOUNTER — Other Ambulatory Visit: Payer: Self-pay | Admitting: Family Medicine

## 2014-07-01 ENCOUNTER — Encounter: Payer: Self-pay | Admitting: Family Medicine

## 2014-07-01 ENCOUNTER — Ambulatory Visit (INDEPENDENT_AMBULATORY_CARE_PROVIDER_SITE_OTHER): Payer: PPO | Admitting: Family Medicine

## 2014-07-01 VITALS — BP 130/78 | HR 88 | Temp 99.1°F | Resp 16 | Ht 72.0 in | Wt 222.0 lb

## 2014-07-01 DIAGNOSIS — Z23 Encounter for immunization: Secondary | ICD-10-CM | POA: Diagnosis not present

## 2014-07-01 DIAGNOSIS — Z0000 Encounter for general adult medical examination without abnormal findings: Secondary | ICD-10-CM | POA: Diagnosis not present

## 2014-07-01 NOTE — Progress Notes (Signed)
Subjective:    Patient ID: [name], male    DOB: May 13, 1950, 64 y.o.   MRN: [id number]  HPI  A she is here today for complete physical exam. His last colonoscopy was in 2011. He has had Pneumovax 23. He is due for Prevnar 13. Last tetanus shot was in 2015. He is due for prostate exam today. He is also due for PSA. His most recent lab work as listed below: Lab on 06/27/2014  Component Date Value Ref Range Status  . WBC 06/27/2014 9.8  4.0 - 10.5 K/uL Final  . RBC 06/27/2014 4.97  4.22 - 5.81 MIL/uL Final  . Hemoglobin 06/27/2014 14.4  13.0 - 17.0 g/dL Final  . HCT 06/27/2014 42.5  39.0 - 52.0 % Final  . MCV 06/27/2014 85.5  78.0 - 100.0 fL Final  . MCH 06/27/2014 29.0  26.0 - 34.0 pg Final  . MCHC 06/27/2014 33.9  30.0 - 36.0 g/dL Final  . RDW 06/27/2014 14.4  11.5 - 15.5 % Final  . Platelets 06/27/2014 302  150 - 400 K/uL Final  . MPV 06/27/2014 9.7  8.6 - 12.4 fL Final  . Neutrophils Relative % 06/27/2014 58  43 - 77 % Final  . Neutro Abs 06/27/2014 5.7  1.7 - 7.7 K/uL Final  . Lymphocytes Relative 06/27/2014 32  12 - 46 % Final  . Lymphs Abs 06/27/2014 3.1  0.7 - 4.0 K/uL Final  . Monocytes Relative 06/27/2014 10  3 - 12 % Final  . Monocytes Absolute 06/27/2014 1.0  0.1 - 1.0 K/uL Final  . Eosinophils Relative 06/27/2014 0  0 - 5 % Final  . Eosinophils Absolute 06/27/2014 0.0  0.0 - 0.7 K/uL Final  . Basophils Relative 06/27/2014 0  0 - 1 % Final  . Basophils Absolute 06/27/2014 0.0  0.0 - 0.1 K/uL Final  . Smear Review 06/27/2014 Criteria for review not met   Final  . Cholesterol 06/27/2014 200  0 - 200 mg/dL Final   Comment: ATP III Classification:       < 200        mg/dL        Desirable      200 - 239     mg/dL        Borderline High      >= 240        mg/dL        High     . Triglycerides 06/27/2014 73  <150 mg/dL Final  . HDL 06/27/2014 50  >=40 mg/dL Final   ** Please note change in reference range(s). **  . Total CHOL/HDL Ratio 06/27/2014 4.0   Final  .  VLDL 06/27/2014 15  0 - 40 mg/dL Final  . LDL Cholesterol 06/27/2014 135* 0 - 99 mg/dL Final   Comment:   Total Cholesterol/HDL Ratio:CHD Risk                        Coronary Heart Disease Risk Table                                        Men       Women          1/2 Average Risk              3.4        3.3  Average Risk              5.0        4.4           2X Average Risk              9.6        7.1           3X Average Risk             23.4       11.0 Use the calculated Patient Ratio above and the CHD Risk table  to determine the patient's CHD Risk. ATP III Classification (LDL):       < 100        mg/dL         Optimal      100 - 129     mg/dL         Near or Above Optimal      130 - 159     mg/dL         Borderline High      160 - 189     mg/dL         High       > 190        mg/dL         Very High     . Sodium 06/27/2014 138  135 - 145 mEq/L Final  . Potassium 06/27/2014 4.5  3.5 - 5.3 mEq/L Final  . Chloride 06/27/2014 104  96 - 112 mEq/L Final  . CO2 06/27/2014 27  19 - 32 mEq/L Final  . Glucose, Bld 06/27/2014 93  70 - 99 mg/dL Final  . BUN 06/27/2014 19  6 - 23 mg/dL Final  . Creat 06/27/2014 1.08  0.50 - 1.35 mg/dL Final  . Total Bilirubin 06/27/2014 0.5  0.2 - 1.2 mg/dL Final  . Alkaline Phosphatase 06/27/2014 61  39 - 117 U/L Final  . AST 06/27/2014 17  0 - 37 U/L Final  . ALT 06/27/2014 19  0 - 53 U/L Final  . Total Protein 06/27/2014 6.0  6.0 - 8.3 g/dL Final  . Albumin 06/27/2014 3.5  3.5 - 5.2 g/dL Final  . Calcium 06/27/2014 8.9  8.4 - 10.5 mg/dL Final  . GFR, Est African American 06/27/2014 83   Final  . GFR, Est Non African American 06/27/2014 72   Final   Comment:   The estimated GFR is a calculation valid for adults (>=69 years old) that uses the CKD-EPI algorithm to adjust for age and sex. It is   not to be used for children, pregnant women, hospitalized patients,    patients on dialysis, or with rapidly changing kidney function. According  to the NKDEP, eGFR >89 is normal, 60-89 shows mild impairment, 30-59 shows moderate impairment, 15-29 shows severe impairment and <15 is ESRD.      She is knee pain has improved dramatically after the bilateral cortisone injections. He is now interested in exercising to try to lose weight Past Medical History  Diagnosis Date  . Diverticulosis 2011  . Former smoker   . HLD (hyperlipidemia)   . Allergy   . Melanoma in situ   . Adenomatous colon polyp 2004  . Hyperplastic colon polyp 2004   Past Surgical History  Procedure Laterality Date  . Colonoscopy  2011    SEVERAL    Current Outpatient Prescriptions on File Prior to Visit  Medication Sig  Dispense Refill  . cetirizine (ZYRTEC) 10 MG tablet Take 1 tablet (10 mg total) by mouth daily. 30 tablet 11  . desonide (DESOWEN) 0.05 % cream Apply topically 2 (two) times daily. 30 g 0  . fluticasone (FLONASE) 50 MCG/ACT nasal spray Place 2 sprays into both nostrils daily. 16 g 6  . metroNIDAZOLE (FLAGYL) 500 MG tablet Take 1 tablet (500 mg total) by mouth 3 (three) times daily. 42 tablet 0  . venlafaxine XR (EFFEXOR-XR) 75 MG 24 hr capsule TAKE 2 CAPSULES BY MOUTH DAILY WITH BREAKFAST 60 capsule 5   No current facility-administered medications on file prior to visit.   Allergies  Allergen Reactions  . Levaquin [Levofloxacin In D5w] Shortness Of Breath   History   Social History  . Marital Status: Married    Spouse Name: N/A  . Number of Children: 2  . Years of Education: N/A   Occupational History  . part sales    Social History Main Topics  . Smoking status: Former Smoker -- 1.00 packs/day for 30 years    Types: Cigarettes, Cigars    Quit date: 05/13/1998  . Smokeless tobacco: Current User    Types: Chew  . Alcohol Use: Yes     Comment: freq  . Drug Use: No  . Sexual Activity: Not on file   Other Topics Concern  . Not on file   Social History Narrative   Family History  Problem Relation Age of Onset  . Lung  cancer Father   . Heart disease Mother     died in her 5's  . Diabetes Daughter       Review of Systems  All other systems reviewed and are negative.      Objective:   Physical Exam  Constitutional: He is oriented to person, place, and time. He appears well-developed and well-nourished. No distress.  HENT:  Head: Normocephalic and atraumatic.  Right Ear: External ear normal.  Left Ear: External ear normal.  Nose: Nose normal.  Mouth/Throat: Oropharynx is clear and moist. No oropharyngeal exudate.  Eyes: Conjunctivae and EOM are normal. Pupils are equal, round, and reactive to light. Right eye exhibits no discharge. Left eye exhibits no discharge. No scleral icterus.  Neck: Normal range of motion. Neck supple. No JVD present. No tracheal deviation present. No thyromegaly present.  Cardiovascular: Normal rate, regular rhythm, normal heart sounds and intact distal pulses.  Exam reveals no gallop and no friction rub.   No murmur heard. Pulmonary/Chest: Effort normal and breath sounds normal. No stridor. No respiratory distress. He has no wheezes. He has no rales. He exhibits no tenderness.  Abdominal: Soft. Bowel sounds are normal. He exhibits no distension and no mass. There is no tenderness. There is no rebound and no guarding.  Genitourinary: Rectum normal, prostate normal and penis normal.  Musculoskeletal: Normal range of motion. He exhibits no edema or tenderness.  Lymphadenopathy:    He has no cervical adenopathy.  Neurological: He is alert and oriented to person, place, and time. He has normal reflexes. He displays normal reflexes. No cranial nerve deficit. He exhibits normal muscle tone. Coordination normal.  Skin: Skin is warm. No rash noted. He is not diaphoretic. No erythema. No pallor.  Psychiatric: He has a normal mood and affect. His behavior is normal. Judgment and thought content normal.  Vitals reviewed.         Assessment & Plan:  Need for prophylactic  vaccination against Streptococcus pneumoniae (pneumococcus) - Plan: Pneumococcal conjugate vaccine  13-valent IM  Routine general medical examination at a health care facility  His physical exam is normal. I recommended decreasing his consumption in saturated fat to improve his LDL cholesterol. His blood pressure is excellent. He received Prevnar 13 today in office. His prostate exam was normal. I will add a PSA to his lab work. Then his preventative care is up-to-date. All his cancer screening is up-to-date. His immunizations are now up-to-date. I did recommend diet exercise and weight loss to help address his obesity. However I recommended that he exercise by using a stationary bike or elliptical machine rather than running and walking because of osteoarthritis in his knees

## 2014-07-02 LAB — PSA: PSA: 0.72 ng/mL (ref <=4.00)

## 2014-07-20 ENCOUNTER — Telehealth: Payer: Self-pay | Admitting: Family Medicine

## 2014-07-20 NOTE — Telephone Encounter (Signed)
[phone] PT called and left VM stating that he is having leg pain at night the pain is on the back side of his legs. His legs are also jerking some. He states it is very aggravating, and he is wondering what is something that he could do.

## 2014-07-20 NOTE — Telephone Encounter (Signed)
Noted may be restless leg, so if he calls back schedule OV

## 2014-07-20 NOTE — Telephone Encounter (Signed)
Spoke to pt and this has been going on for a few weeks - his legs feel sore like he has been exercising but it is worse at night. She he has a lot of twitching and jerking as well and has been using ice and a heating pad to help with this problem. I asked if he had been drinking enough and he said prob not so I recommended he drink gatorade and if no better by Friday to call us back - pt agreed and will call back if no better.

## 2014-07-20 NOTE — Telephone Encounter (Signed)
Please call and triage, was this a one time thing or persistant, schedule OV if needed If just spasm incraese fluids, drink gaterade

## 2014-08-03 ENCOUNTER — Encounter: Payer: Self-pay | Admitting: Physician Assistant

## 2014-08-03 ENCOUNTER — Ambulatory Visit (INDEPENDENT_AMBULATORY_CARE_PROVIDER_SITE_OTHER): Payer: PPO | Admitting: Physician Assistant

## 2014-08-03 VITALS — BP 124/66 | HR 68 | Temp 98.1°F | Resp 12 | Ht 72.0 in | Wt 224.0 lb

## 2014-08-03 DIAGNOSIS — R635 Abnormal weight gain: Secondary | ICD-10-CM | POA: Diagnosis not present

## 2014-08-03 DIAGNOSIS — R609 Edema, unspecified: Secondary | ICD-10-CM

## 2014-08-03 DIAGNOSIS — E8770 Fluid overload, unspecified: Secondary | ICD-10-CM

## 2014-08-03 NOTE — Progress Notes (Signed)
Patient ID: [name] MRN: [id number], DOB: 1950-07-06, 64 y.o. Date of Encounter: 08/03/2014, 4:34 PM    Chief Complaint:  Chief Complaint  Patient presents with  . Weight Gain    x1 week- states that he is weighing self daily (same time of day/ same amt of clothing)- noted 10lb weight gain at home- also reports leg cramps and edema to leg and more fullness in stomach     HPI: 64 y.o. year old white male presents with above.  I reviewed his chart and that he usually sees Dr. Dennard Schaumann and that he actually just saw him for a CPE 07/01/14. He says that since then he has been eating the same, and doing the same activity. Says that the only thing that has changed at all is that he has increased his water intake and been drinking more Gatorade. As stated above he weighs himself daily and has gotten a 10 pound weight gain according to his weight checks at home. I reviewed that according to the weights documented here that when he was here 07/01/14 weight was 222 and today is 224. He says that that day when he was here June 10 he had on heavy boots and 2 cell phones. Says that even his shorts have been feeling tighter like he is having fluid retention in his abdomen also. Says some of his pants he can't even button.  Sleeps on 2 pillows for comfort and has done this for a long time. No orthopnea. No PND/postural nocturnal dyspnea. No increased shortness of breath/dyspnea on exertion.     Home Meds:   Outpatient Prescriptions Prior to Visit  Medication Sig Dispense Refill  . cetirizine (ZYRTEC) 10 MG tablet Take 1 tablet (10 mg total) by mouth daily. 30 tablet 11  . venlafaxine XR (EFFEXOR-XR) 75 MG 24 hr capsule TAKE 2 CAPSULES BY MOUTH DAILY WITH BREAKFAST (Patient taking differently: 1 cap PO QD) 60 capsule 5  . desonide (DESOWEN) 0.05 % cream Apply topically 2 (two) times daily. (Patient not taking: Reported on 08/03/2014) 30 g 0  . fluticasone (FLONASE) 50 MCG/ACT nasal spray Place  2 sprays into both nostrils daily. (Patient not taking: Reported on 08/03/2014) 16 g 6  . metroNIDAZOLE (FLAGYL) 500 MG tablet Take 1 tablet (500 mg total) by mouth 3 (three) times daily. (Patient not taking: Reported on 08/03/2014) 42 tablet 0   No facility-administered medications prior to visit.    Allergies:  Allergies  Allergen Reactions  . Levaquin [Levofloxacin In D5w] Shortness Of Breath      Review of Systems: See HPI for pertinent ROS. All other ROS negative.    Physical Exam: Blood pressure 124/66, pulse 68, temperature 98.1 F (36.7 C), temperature source Oral, resp. rate 12, height 6' (1.829 m), weight 224 lb (101.606 kg)., Body mass index is 30.37 kg/(m^2). General: WNWD WM.  Appears in no acute distress. Neck: Supple. No thyromegaly. No lymphadenopathy. Lungs: Clear bilaterally to auscultation without wheezes, rales, or rhonchi. Breathing is unlabored. Heart: Regular rhythm. No murmurs, rubs, or gallops. Abdomen: Soft, non-tender, non-distended with normoactive bowel sounds. No hepatomegaly. No rebound/guarding. No obvious abdominal masses. Msk:  Strength and tone normal for age. Extremities/Skin: Warm and dry.  No edema.  Neuro: Alert and oriented X 3. Moves all extremities spontaneously. Gait is normal. CNII-XII grossly in tact. Psych:  Responds to questions appropriately with a normal affect.     ASSESSMENT AND PLAN:  64 y.o. year old male with  1.  Weight gain - COMPLETE METABOLIC PANEL WITH GFR - TSH - Brain natriuretic peptide  2. Fluid retention - COMPLETE METABOLIC PANEL WITH GFR - TSH - Brain natriuretic peptide  Will obtain above labs. Then will f/u with pt. Once obtain lab results.   [address] [place], [place], [employer] 08/03/2014 4:34 PM

## 2014-08-04 LAB — COMPLETE METABOLIC PANEL WITH GFR
ALT: 19 U/L (ref 0–53)
AST: 19 U/L (ref 0–37)
Albumin: 4 g/dL (ref 3.5–5.2)
Alkaline Phosphatase: 81 U/L (ref 39–117)
BUN: 14 mg/dL (ref 6–23)
CO2: 24 mEq/L (ref 19–32)
Calcium: 9.3 mg/dL (ref 8.4–10.5)
Chloride: 102 mEq/L (ref 96–112)
Creat: 1.08 mg/dL (ref 0.50–1.35)
GFR, Est African American: 83 mL/min
GFR, Est Non African American: 72 mL/min
Glucose, Bld: 90 mg/dL (ref 70–99)
Potassium: 4.5 mEq/L (ref 3.5–5.3)
Sodium: 138 mEq/L (ref 135–145)
Total Bilirubin: 0.8 mg/dL (ref 0.2–1.2)
Total Protein: 6.6 g/dL (ref 6.0–8.3)

## 2014-08-04 LAB — BRAIN NATRIURETIC PEPTIDE: Brain Natriuretic Peptide: 23.2 pg/mL (ref 0.0–100.0)

## 2014-08-04 LAB — TSH: TSH: 3.907 u[IU]/mL (ref 0.350–4.500)

## 2014-11-03 ENCOUNTER — Encounter: Payer: Self-pay | Admitting: Family Medicine

## 2014-11-03 ENCOUNTER — Ambulatory Visit (INDEPENDENT_AMBULATORY_CARE_PROVIDER_SITE_OTHER): Payer: PPO | Admitting: Family Medicine

## 2014-11-03 VITALS — BP 136/70 | HR 78 | Temp 97.9°F | Resp 14 | Ht 72.0 in | Wt 225.0 lb

## 2014-11-03 DIAGNOSIS — M722 Plantar fascial fibromatosis: Secondary | ICD-10-CM | POA: Diagnosis not present

## 2014-11-03 DIAGNOSIS — Z23 Encounter for immunization: Secondary | ICD-10-CM | POA: Diagnosis not present

## 2014-11-03 MED ORDER — DICLOFENAC SODIUM 75 MG PO TBEC
75.0000 mg | DELAYED_RELEASE_TABLET | Freq: Two times a day (BID) | ORAL | Status: DC
Start: 2014-11-03 — End: 2014-12-27

## 2014-11-03 NOTE — Progress Notes (Signed)
   Subjective:    Patient ID: [name], male    DOB: Aug 28, 1950, 64 y.o.   MRN: [id number]  HPI Patient presents with pain in his left heel 1 month. The pain is located over the insertion of the plantar fascia on the medial plantar surface of the calcaneus. Pain is worse first thing in the morning as soon as he gets out of bed. After 4 or 5 steps, the pain will improve. Then gradually comes back later in the day. He denies any falls or injuries. He also would like to have 2 small skin tags removed on the left side of his neck. I did this with a pair of scissors without using anesthesia. Hemostasis was achieved with Drysol. Past Medical History  Diagnosis Date  . Diverticulosis 2011  . Former smoker   . HLD (hyperlipidemia)   . Allergy   . Melanoma in situ ([place])   . Adenomatous colon polyp 2004  . Hyperplastic colon polyp 2004   Past Surgical History  Procedure Laterality Date  . Colonoscopy  2011    SEVERAL    Current Outpatient Prescriptions on File Prior to Visit  Medication Sig Dispense Refill  . cetirizine (ZYRTEC) 10 MG tablet Take 1 tablet (10 mg total) by mouth daily. 30 tablet 11  . venlafaxine XR (EFFEXOR-XR) 75 MG 24 hr capsule TAKE 2 CAPSULES BY MOUTH DAILY WITH BREAKFAST (Patient taking differently: 1 cap PO QD) 60 capsule 5   No current facility-administered medications on file prior to visit.   Allergies  Allergen Reactions  . Levaquin [Levofloxacin In D5w] Shortness Of Breath   Social History   Social History  . Marital Status: Married    Spouse Name: N/A  . Number of Children: 2  . Years of Education: N/A   Occupational History  . part sales    Social History Main Topics  . Smoking status: Former Smoker -- 1.00 packs/day for 30 years    Types: Cigarettes, Cigars    Quit date: 05/13/1998  . Smokeless tobacco: Current User    Types: Chew  . Alcohol Use: Yes     Comment: freq  . Drug Use: No  . Sexual Activity: Not on file   Other Topics Concern   . Not on file   Social History Narrative      Review of Systems  All other systems reviewed and are negative.      Objective:   Physical Exam  Cardiovascular: Normal rate, regular rhythm and normal heart sounds.   Pulmonary/Chest: Effort normal and breath sounds normal.  Musculoskeletal:       Left foot: There is tenderness and bony tenderness.  Vitals reviewed.         Assessment & Plan:  Need for prophylactic vaccination and inoculation against influenza - Plan: Flu Vaccine QUAD 36+ mos PF IM (Fluarix & Fluzone Quad PF)  Plantar fasciitis of left foot - Plan: diclofenac (VOLTAREN) 75 MG EC tablet  Patient has plantar fasciitis. Begin diclofenac 75 mg by mouth twice a day. I gave the patient a handout on stretches to perform at home to try to limber up the plantar fascia and to his improve his pain.  I also recommended orthotics. Recheck in 3-4 weeks if no better or sooner if worse

## 2014-12-20 ENCOUNTER — Telehealth: Payer: Self-pay | Admitting: Family Medicine

## 2014-12-20 NOTE — Telephone Encounter (Signed)
Do you recommend these FLEXOGENIX injections?

## 2014-12-20 NOTE — Telephone Encounter (Signed)
Pt has seen a lot of ads on the Flexogenix injections and would like some advice on whether this medication may help him. Please call [phone]

## 2014-12-20 NOTE — Telephone Encounter (Signed)
Patient is calling to speak with you regarding seeing flexogent???? Something he saw on tv  [phone]

## 2014-12-20 NOTE — Telephone Encounter (Signed)
This is a program involving hyaluronic acid injections, PT, and knee braces as far as I can tell.  Reasonable to try.  They could offer similar care at ortho.

## 2014-12-20 NOTE — Telephone Encounter (Signed)
Pt informed of below and states that the pain feels more like muscle pain and I recommended an ov to discuss and if we need to refer to ortho we can do that at that ov. He agreed and appt made

## 2014-12-27 ENCOUNTER — Ambulatory Visit (INDEPENDENT_AMBULATORY_CARE_PROVIDER_SITE_OTHER): Payer: PPO | Admitting: Family Medicine

## 2014-12-27 ENCOUNTER — Encounter: Payer: Self-pay | Admitting: Family Medicine

## 2014-12-27 VITALS — BP 130/80 | HR 84 | Temp 98.1°F | Resp 18 | Ht 72.0 in | Wt 234.0 lb

## 2014-12-27 DIAGNOSIS — M79604 Pain in right leg: Secondary | ICD-10-CM

## 2014-12-27 DIAGNOSIS — M791 Myalgia, unspecified site: Secondary | ICD-10-CM

## 2014-12-27 DIAGNOSIS — M79605 Pain in left leg: Secondary | ICD-10-CM

## 2014-12-27 LAB — CBC WITH DIFFERENTIAL/PLATELET
Basophils Absolute: 0.1 10*3/uL (ref 0.0–0.1)
Basophils Relative: 1 % (ref 0–1)
Eosinophils Absolute: 0.5 10*3/uL (ref 0.0–0.7)
Eosinophils Relative: 5 % (ref 0–5)
HCT: 43.6 % (ref 39.0–52.0)
Hemoglobin: 14.5 g/dL (ref 13.0–17.0)
Lymphocytes Relative: 31 % (ref 12–46)
Lymphs Abs: 3.3 10*3/uL (ref 0.7–4.0)
MCH: 28.2 pg (ref 26.0–34.0)
MCHC: 33.3 g/dL (ref 30.0–36.0)
MCV: 84.7 fL (ref 78.0–100.0)
MPV: 10.3 fL (ref 8.6–12.4)
Monocytes Absolute: 1.3 10*3/uL — ABNORMAL HIGH (ref 0.1–1.0)
Monocytes Relative: 12 % (ref 3–12)
Neutro Abs: 5.5 10*3/uL (ref 1.7–7.7)
Neutrophils Relative %: 51 % (ref 43–77)
Platelets: 345 10*3/uL (ref 150–400)
RBC: 5.15 MIL/uL (ref 4.22–5.81)
RDW: 13.9 % (ref 11.5–15.5)
WBC: 10.8 10*3/uL — ABNORMAL HIGH (ref 4.0–10.5)

## 2014-12-27 NOTE — Progress Notes (Signed)
Subjective:    Patient ID: [name], male    DOB: 1950/04/20, 64 y.o.   MRN: [id number]  HPI Patient presents with over a month of pain and stiffness in both legs. He states that his file muscles and calf muscles constantly or sore and ache. This is worse with walking. He can walk very short distances and the pain intensifies.  He denies any low back pain. He denies any numbness or tingling in the legs. He denies any burning in the legs. He states that his legs feels like he is just exercised too much and they feel weak and tired and sore constantly. He denies any knee pain. He also has symptoms of restless leg syndrome. He states that his legs will ache and twitch at night. This improves if he gets up and walks however then the soreness and pain in his calves and his thigh muscles in his hamstrings will return. On examination today he has normal reflexes 2 over 4 at the patella and Achilles bilaterally. Muscle strength is 5 over 5 equal and symmetric in both legs. He has normal sensation in both feet. He has palpable dorsalis pedis pulses bilaterally. Posterior tibialis pulses are weak at best. He has a palpable popliteal pulse in the left leg. I am not able to appreciate a popliteal pulse in the right leg. He has strong femoral pulses bilaterally Past Medical History  Diagnosis Date  . Diverticulosis 2011  . Former smoker   . HLD (hyperlipidemia)   . Allergy   . Melanoma in situ ([place])   . Adenomatous colon polyp 2004  . Hyperplastic colon polyp 2004   Past Surgical History  Procedure Laterality Date  . Colonoscopy  2011    SEVERAL    Current Outpatient Prescriptions on File Prior to Visit  Medication Sig Dispense Refill  . cetirizine (ZYRTEC) 10 MG tablet Take 1 tablet (10 mg total) by mouth daily. 30 tablet 11  . venlafaxine XR (EFFEXOR-XR) 75 MG 24 hr capsule TAKE 2 CAPSULES BY MOUTH DAILY WITH BREAKFAST (Patient taking differently: 1 cap PO QOD) 60 capsule 5   No current  facility-administered medications on file prior to visit.   Allergies  Allergen Reactions  . Levaquin [Levofloxacin In D5w] Shortness Of Breath   Social History   Social History  . Marital Status: Married    Spouse Name: N/A  . Number of Children: 2  . Years of Education: N/A   Occupational History  . part sales    Social History Main Topics  . Smoking status: Former Smoker -- 1.00 packs/day for 30 years    Types: Cigarettes, Cigars    Quit date: 05/13/1998  . Smokeless tobacco: Current User    Types: Chew  . Alcohol Use: Yes     Comment: freq  . Drug Use: No  . Sexual Activity: Not on file   Other Topics Concern  . Not on file   Social History Narrative      Review of Systems  All other systems reviewed and are negative.      Objective:   Physical Exam  Cardiovascular: Normal rate, regular rhythm and normal heart sounds.   Pulses:      Femoral pulses are 2+ on the right side, and 2+ on the left side.      Popliteal pulses are 0 on the right side, and 1+ on the left side.       Dorsalis pedis pulses are 2+ on the right  side, and 2+ on the left side.       Posterior tibial pulses are 1+ on the right side, and 1+ on the left side.  Pulmonary/Chest: Effort normal and breath sounds normal.  Abdominal: Soft. Bowel sounds are normal. He exhibits no distension. There is no tenderness. There is no rebound.  Musculoskeletal:       Right upper leg: He exhibits tenderness. He exhibits no bony tenderness.       Left upper leg: He exhibits tenderness. He exhibits no bony tenderness.       Right lower leg: He exhibits tenderness.       Left lower leg: He exhibits tenderness.  Vitals reviewed.         Assessment & Plan:  Myalgia - Plan: CK, CBC with Differential/Platelet, COMPLETE METABOLIC PANEL WITH GFR, Sedimentation rate  Bilateral leg pain - Plan: Ultrasound doppler arterial leg left, Ultrasound doppler arterial leg right  Differential diagnosis includes  claudication from peripheral vascular disease, autoimmune myalgias and myositis neuropathic pain from spinal stenosis, or generalized deconditioning secondary to lack of use due to osteoarthritis. I will obtain arterial Dopplers of both legs to evaluate for peripheral vascular disease. I will rule out autoimmune diseases by checking a CK as well as a sedimentation rate. Consider nerve conduction studies versus an MRI of the lumbar spine to evaluate for spinal stenosis if the above tests are normal.

## 2014-12-28 LAB — COMPLETE METABOLIC PANEL WITH GFR
ALT: 24 U/L (ref 9–46)
AST: 30 U/L (ref 10–35)
Albumin: 4.1 g/dL (ref 3.6–5.1)
Alkaline Phosphatase: 81 U/L (ref 40–115)
BUN: 15 mg/dL (ref 7–25)
CO2: 26 mmol/L (ref 20–31)
Calcium: 9.1 mg/dL (ref 8.6–10.3)
Chloride: 102 mmol/L (ref 98–110)
Creat: 1.05 mg/dL (ref 0.70–1.25)
GFR, Est African American: 85 mL/min (ref >=60)
GFR, Est Non African American: 74 mL/min (ref >=60)
Glucose, Bld: 97 mg/dL (ref 70–99)
Potassium: 4.1 mmol/L (ref 3.5–5.3)
Sodium: 138 mmol/L (ref 135–146)
Total Bilirubin: 0.5 mg/dL (ref 0.2–1.2)
Total Protein: 6.5 g/dL (ref 6.1–8.1)

## 2014-12-28 LAB — CK: Total CK: 258 U/L — ABNORMAL HIGH (ref 7–232)

## 2014-12-28 LAB — SEDIMENTATION RATE: Sed Rate: 1 mm/hr (ref 0–20)

## 2014-12-29 ENCOUNTER — Encounter: Payer: Self-pay | Admitting: Family Medicine

## 2015-01-03 ENCOUNTER — Encounter: Payer: Self-pay | Admitting: Family Medicine

## 2015-01-03 ENCOUNTER — Ambulatory Visit (INDEPENDENT_AMBULATORY_CARE_PROVIDER_SITE_OTHER): Payer: PPO | Admitting: Family Medicine

## 2015-01-03 VITALS — BP 120/80 | HR 80 | Temp 98.1°F | Resp 18 | Ht 72.0 in | Wt 232.0 lb

## 2015-01-03 DIAGNOSIS — M79604 Pain in right leg: Secondary | ICD-10-CM | POA: Diagnosis not present

## 2015-01-03 DIAGNOSIS — R748 Abnormal levels of other serum enzymes: Secondary | ICD-10-CM

## 2015-01-03 DIAGNOSIS — M791 Myalgia, unspecified site: Secondary | ICD-10-CM

## 2015-01-03 DIAGNOSIS — M79605 Pain in left leg: Secondary | ICD-10-CM | POA: Diagnosis not present

## 2015-01-03 NOTE — Progress Notes (Signed)
 Subjective:    Patient ID: [name], male    DOB: 08/16/1950, 64 y.o.   MRN: [id number]  HPI 12/27/14 Patient presents with over a month of pain and stiffness in both legs. He states that his thigh muscles and calf muscles constantly or sore and ache. This is worse with walking. He can walk very short distances and the pain intensifies.  He denies any low back pain. He denies any numbness or tingling in the legs. He denies any burning in the legs. He states that his legs feels like he is just exercised too much and they feel weak and tired and sore constantly. He denies any knee pain. He also has symptoms of restless leg syndrome. He states that his legs will ache and twitch at night. This improves if he gets up and walks however then the soreness and pain in his calves and his thigh muscles in his hamstrings will return. On examination today he has normal reflexes 2 over 4 at the patella and Achilles bilaterally. Muscle strength is 5 over 5 equal and symmetric in both legs. He has normal sensation in both feet. He has palpable dorsalis pedis pulses bilaterally. Posterior tibialis pulses are weak at best. He has a palpable popliteal pulse in the left leg. I am not able to appreciate a popliteal pulse in the right leg. He has strong femoral pulses bilaterally.  At that time, my plan was: Differential diagnosis includes claudication from peripheral vascular disease, autoimmune myalgias and myositis neuropathic pain from spinal stenosis, or generalized deconditioning secondary to lack of use due to osteoarthritis. I will obtain arterial Dopplers of both legs to evaluate for peripheral vascular disease. I will rule out autoimmune diseases by checking a CK as well as a sedimentation rate. Consider nerve conduction studies versus an MRI of the lumbar spine to evaluate for spinal stenosis if the above tests are normal.  01/03/15 Office Visit on 12/27/2014  Component Date Value Ref Range Status  . Total CK  12/27/2014 258* 7 - 232 U/L Final  . WBC 12/27/2014 10.8* 4.0 - 10.5 K/uL Final  . RBC 12/27/2014 5.15  4.22 - 5.81 MIL/uL Final  . Hemoglobin 12/27/2014 14.5  13.0 - 17.0 g/dL Final  . HCT 12/27/2014 43.6  39.0 - 52.0 % Final  . MCV 12/27/2014 84.7  78.0 - 100.0 fL Final  . MCH 12/27/2014 28.2  26.0 - 34.0 pg Final  . MCHC 12/27/2014 33.3  30.0 - 36.0 g/dL Final  . RDW 12/27/2014 13.9  11.5 - 15.5 % Final  . Platelets 12/27/2014 345  150 - 400 K/uL Final  . MPV 12/27/2014 10.3  8.6 - 12.4 fL Final  . Neutrophils Relative % 12/27/2014 51  43 - 77 % Final  . Neutro Abs 12/27/2014 5.5  1.7 - 7.7 K/uL Final  . Lymphocytes Relative 12/27/2014 31  12 - 46 % Final  . Lymphs Abs 12/27/2014 3.3  0.7 - 4.0 K/uL Final  . Monocytes Relative 12/27/2014 12  3 - 12 % Final  . Monocytes Absolute 12/27/2014 1.3* 0.1 - 1.0 K/uL Final  . Eosinophils Relative 12/27/2014 5  0 - 5 % Final  . Eosinophils Absolute 12/27/2014 0.5  0.0 - 0.7 K/uL Final  . Basophils Relative 12/27/2014 1  0 - 1 % Final  . Basophils Absolute 12/27/2014 0.1  0.0 - 0.1 K/uL Final  . Smear Review 12/27/2014 Criteria for review not met   Final  . Sodium 12/27/2014 138    135 - 146 mmol/L Final  . Potassium 12/27/2014 4.1  3.5 - 5.3 mmol/L Final  . Chloride 12/27/2014 102  98 - 110 mmol/L Final  . CO2 12/27/2014 26  20 - 31 mmol/L Final  . Glucose, Bld 12/27/2014 97  70 - 99 mg/dL Final  . BUN 12/27/2014 15  7 - 25 mg/dL Final  . Creat 12/27/2014 1.05  0.70 - 1.25 mg/dL Final  . Total Bilirubin 12/27/2014 0.5  0.2 - 1.2 mg/dL Final  . Alkaline Phosphatase 12/27/2014 81  40 - 115 U/L Final  . AST 12/27/2014 30  10 - 35 U/L Final  . ALT 12/27/2014 24  9 - 46 U/L Final  . Total Protein 12/27/2014 6.5  6.1 - 8.1 g/dL Final  . Albumin 12/27/2014 4.1  3.6 - 5.1 g/dL Final  . Calcium 12/27/2014 9.1  8.6 - 10.3 mg/dL Final  . GFR, Est African American 12/27/2014 85  >=60 mL/min Final  . GFR, Est Non African American 12/27/2014 74  >=60  mL/min Final   Comment:   The estimated GFR is a calculation valid for adults (>=18 years old) that uses the CKD-EPI algorithm to adjust for age and sex. It is   not to be used for children, pregnant women, hospitalized patients,    patients on dialysis, or with rapidly changing kidney function. According to the NKDEP, eGFR >89 is normal, 60-89 shows mild impairment, 30-59 shows moderate impairment, 15-29 shows severe impairment and <15 is ESRD.     . Sed Rate 12/27/2014 1  0 - 20 mm/hr Final   Patient's CK level was mildly elevated. This is very nonspecific but does raise the question of some possible myopathy versus neuromuscular diseases. I've asked the patient to return today to discuss this  Further and then to decide on the next step  Past Medical History  Diagnosis Date  . Diverticulosis 2011  . Former smoker   . HLD (hyperlipidemia)   . Allergy   . Melanoma in situ (H[facility])   . Adenomatous colon polyp 2004  . Hyperplastic colon polyp 2004   Past Surgical History  Procedure Laterality Date  . Colonoscopy  2011    SEVERAL    Current Outpatient Prescriptions on File Prior to Visit  Medication Sig Dispense Refill  . cetirizine (ZYRTEC) 10 MG tablet Take 1 tablet (10 mg total) by mouth daily. 30 tablet 11  . venlafaxine XR (EFFEXOR-XR) 75 MG 24 hr capsule TAKE 2 CAPSULES BY MOUTH DAILY WITH BREAKFAST (Patient taking differently: 1 cap PO QOD) 60 capsule 5   No current facility-administered medications on file prior to visit.   Allergies  Allergen Reactions  . Levaquin [Levofloxacin In D5w] Shortness Of Breath   Social History   Social History  . Marital Status: Married    Spouse Name: N/A  . Number of Children: 2  . Years of Education: N/A   Occupational History  . part sales    Social History Main Topics  . Smoking status: Former Smoker -- 1.00 packs/day for 30 years    Types: Cigarettes, Cigars    Quit date: 05/13/1998  . Smokeless tobacco: Current User     Types: Chew  . Alcohol Use: Yes     Comment: freq  . Drug Use: No  . Sexual Activity: Not on file   Other Topics Concern  . Not on file   Social History Narrative      Review of Systems  All other systems reviewed and   are negative.      Objective:   Physical Exam  Cardiovascular: Normal rate, regular rhythm and normal heart sounds.   Pulses:      Femoral pulses are 2+ on the right side, and 2+ on the left side.      Popliteal pulses are 0 on the right side, and 1+ on the left side.       Dorsalis pedis pulses are 2+ on the right side, and 2+ on the left side.       Posterior tibial pulses are 1+ on the right side, and 1+ on the left side.  Pulmonary/Chest: Effort normal and breath sounds normal.  Abdominal: Soft. Bowel sounds are normal. He exhibits no distension. There is no tenderness. There is no rebound.  Musculoskeletal:       Right upper leg: He exhibits tenderness. He exhibits no bony tenderness.       Left upper leg: He exhibits tenderness. He exhibits no bony tenderness.       Right lower leg: He exhibits tenderness.       Left lower leg: He exhibits tenderness.  Vitals reviewed.         Assessment & Plan:  Elevated CK - Plan: Sedimentation rate, TSH, CK, Lactate dehydrogenase, Aldolase  Myalgia - Plan: Sedimentation rate, TSH, CK, Lactate dehydrogenase, Aldolase  Bilateral leg pain - Plan: Sedimentation rate, TSH, CK, Lactate dehydrogenase, Aldolase repeat a CK level to see if this was a transient lab abnormality or if it is persistent. I will also check other muscle enzymes including an aldolase as well as an LDH to see if there is a consistent elevation in his muscle enzymes. I will repeat a sedimentation rate to see if there is any evidence of autoimmune disease. I will also check a TSH to evaluate for hyperthyroidism. If there are persistent elevations in his muscle enzymes, I would recommend a muscle biopsy as well as an EMG/nerve conduction studies to  evaluate for myopathies.  I will also check for Lyme disease as well as lupus. If lab work is normal this time, I will attribute the previous mild elevation in CK to a possible temporary transient elevation and I would recommend cortisone injections in both knees along with physical therapy

## 2015-01-04 LAB — ANA: Anti Nuclear Antibody(ANA): NEGATIVE

## 2015-01-04 LAB — TSH: TSH: 3.574 u[IU]/mL (ref 0.350–4.500)

## 2015-01-04 LAB — CK: Total CK: 52 U/L (ref 7–232)

## 2015-01-04 LAB — SEDIMENTATION RATE: Sed Rate: 1 mm/hr (ref 0–20)

## 2015-01-04 LAB — LACTATE DEHYDROGENASE: LDH: 166 U/L (ref 94–250)

## 2015-01-05 LAB — LYME ABY, WSTRN BLT IGG & IGM W/BANDS

## 2015-01-06 ENCOUNTER — Telehealth: Payer: Self-pay | Admitting: Family Medicine

## 2015-01-06 DIAGNOSIS — M25561 Pain in right knee: Secondary | ICD-10-CM

## 2015-01-06 DIAGNOSIS — M25562 Pain in left knee: Principal | ICD-10-CM

## 2015-01-06 LAB — ALDOLASE: Aldolase: 5.2 U/L (ref <=8.1)

## 2015-01-06 NOTE — Telephone Encounter (Signed)
Pt aware, appt next week for injections

## 2015-01-06 NOTE — Telephone Encounter (Signed)
Made aware of test results.  Wants to know if new injections would be better then cortisone injections.  Please advise?  Ref for PT placed.

## 2015-01-06 NOTE — Telephone Encounter (Signed)
I would try cortisone shots first.

## 2015-01-10 ENCOUNTER — Ambulatory Visit (INDEPENDENT_AMBULATORY_CARE_PROVIDER_SITE_OTHER): Payer: PPO | Admitting: Family Medicine

## 2015-01-10 ENCOUNTER — Encounter: Payer: Self-pay | Admitting: Family Medicine

## 2015-01-10 VITALS — BP 110/68 | HR 80 | Temp 98.0°F | Resp 18 | Ht 72.0 in | Wt 232.0 lb

## 2015-01-10 DIAGNOSIS — M25561 Pain in right knee: Secondary | ICD-10-CM | POA: Diagnosis not present

## 2015-01-10 DIAGNOSIS — R748 Abnormal levels of other serum enzymes: Secondary | ICD-10-CM | POA: Diagnosis not present

## 2015-01-10 DIAGNOSIS — M79605 Pain in left leg: Secondary | ICD-10-CM

## 2015-01-10 DIAGNOSIS — M25562 Pain in left knee: Secondary | ICD-10-CM

## 2015-01-10 DIAGNOSIS — M79604 Pain in right leg: Secondary | ICD-10-CM

## 2015-01-10 NOTE — Progress Notes (Signed)
Subjective:    Patient ID: [name], male    DOB: 02/17/1950, 64 y.o.   MRN: [id number]  HPI 12/27/14 Patient presents with over a month of pain and stiffness in both legs. He states that his thigh muscles and calf muscles constantly or sore and ache. This is worse with walking. He can walk very short distances and the pain intensifies.  He denies any low back pain. He denies any numbness or tingling in the legs. He denies any burning in the legs. He states that his legs feels like he is just exercised too much and they feel weak and tired and sore constantly. He denies any knee pain. He also has symptoms of restless leg syndrome. He states that his legs will ache and twitch at night. This improves if he gets up and walks however then the soreness and pain in his calves and his thigh muscles in his hamstrings will return. On examination today he has normal reflexes 2 over 4 at the patella and Achilles bilaterally. Muscle strength is 5 over 5 equal and symmetric in both legs. He has normal sensation in both feet. He has palpable dorsalis pedis pulses bilaterally. Posterior tibialis pulses are weak at best. He has a palpable popliteal pulse in the left leg. I am not able to appreciate a popliteal pulse in the right leg. He has strong femoral pulses bilaterally.  At that time, my plan was: Differential diagnosis includes claudication from peripheral vascular disease, autoimmune myalgias and myositis neuropathic pain from spinal stenosis, or generalized deconditioning secondary to lack of use due to osteoarthritis. I will obtain arterial Dopplers of both legs to evaluate for peripheral vascular disease. I will rule out autoimmune diseases by checking a CK as well as a sedimentation rate. Consider nerve conduction studies versus an MRI of the lumbar spine to evaluate for spinal stenosis if the above tests are normal.  01/03/15 Office Visit on 01/03/2015  Component Date Value Ref Range Status  . Sed Rate  01/03/2015 1  0 - 20 mm/hr Final  . TSH 01/03/2015 3.574  0.350 - 4.500 uIU/mL Final  . Total CK 01/03/2015 52  7 - 232 U/L Final  . LDH 01/03/2015 166  94 - 250 U/L Final  . Aldolase 01/03/2015 5.2  <=8.1 U/L Final  . Anit Nuclear Antibody(ANA) 01/03/2015 NEG  NEGATIVE Final  . B burgdorferi IgG Abs (IB) 01/03/2015 Negative   Final  . Lyme Disease 18 kD IgG 01/03/2015 Non Reactive   Final  . Lyme Disease 23 kD IgG 01/03/2015 Non Reactive   Final  . Lyme Disease 28 kD IgG 01/03/2015 Non Reactive   Final  . Lyme Disease 30 kD IgG 01/03/2015 Non Reactive   Final  . Lyme Disease 39 kD IgG 01/03/2015 Non Reactive   Final  . Lyme Disease 41 kD IgG 01/03/2015 Non Reactive   Final  . Lyme Disease 45 kD IgG 01/03/2015 Non Reactive   Final  . Lyme Disease 58 kD IgG 01/03/2015 Non Reactive   Final  . Lyme Disease 66 kD IgG 01/03/2015 Non Reactive   Final  . Lyme Disease 93 kD IgG 01/03/2015 Non Reactive   Final   Comment:    ** Reference Range for each analyte: Negative or Non Reactive **    IgG Positive: Any five of the following ten bands: 18, 23, 28, 30,  39, 41, 45, 58, 66, 93 kDa.  IgG Negative: Any pattern that does not meet the IgG  positive  criteria.   . B burgdorferi IgM Abs (IB) 01/03/2015 Negative   Final  . Lyme Disease 23 kD IgM 01/03/2015 Non Reactive   Final  . Lyme Disease 39 kD IgM 01/03/2015 Non Reactive   Final  . Lyme Disease 41 kD IgM 01/03/2015 Non Reactive   Final   Comment:    ** Reference Range for each analyte: Negative or Non Reactive **    IgM Positive: Any two of the following three bands: 23, 39, 41 kDa.  IgM Negative: Any pattern that does not meet the IgM positive  criteria. A positive IgM test result alone is not recommended for use in determining active disease due to a high likelihood of a false-positive result in persons with illness greater than 1 month duration. The CDC recommends testing by the EIA antibody test, followed by the Immunoblot  confirmatory test only when the EIA test is positive.    Patient's CK level was mildly elevated. This is very nonspecific but does raise the question of some possible myopathy versus neuromuscular diseases. I've asked the patient to return today to discuss this  Further and then to decide on the next step.  At that time, my plan was: repeat a CK level to see if this was a transient lab abnormality or if it is persistent. I will also check other muscle enzymes including an aldolase as well as an LDH to see if there is a consistent elevation in his muscle enzymes. I will repeat a sedimentation rate to see if there is any evidence of autoimmune disease. I will also check a TSH to evaluate for hyperthyroidism. If there are persistent elevations in his muscle enzymes, I would recommend a muscle biopsy as well as an EMG/nerve conduction studies to evaluate for myopathies.  I will also check for Lyme disease as well as lupus. If lab work is normal this time, I will attribute the previous mild elevation in CK to a possible temporary transient elevation and I would recommend cortisone injections in both knees along with physical therapy  01/10/15 Thankfully, all the patient's blood work returned normal. The elevation in CK seemed to be a transient elevation. His sedimentation rate and ANA were normal. At the present time the majority of the patient's problems are centered on the posterior aspects of both knees along with the proximal calf and the distal hamstring. He has an antalgic gait. He has decreased range of motion with pain in both knees. Upon review of his old chart, there was a time I was concerned that he may have suffered a meniscal tear. Cortisone injections improved his pain and we never performing the MRI. It is possible that this is been re-exacerbated  Past Medical History  Diagnosis Date  . Diverticulosis 2011  . Former smoker   . HLD (hyperlipidemia)   . Allergy   . Melanoma in situ ([place])     . Adenomatous colon polyp 2004  . Hyperplastic colon polyp 2004   Past Surgical History  Procedure Laterality Date  . Colonoscopy  2011    SEVERAL    Current Outpatient Prescriptions on File Prior to Visit  Medication Sig Dispense Refill  . cetirizine (ZYRTEC) 10 MG tablet Take 1 tablet (10 mg total) by mouth daily. 30 tablet 11  . venlafaxine XR (EFFEXOR-XR) 75 MG 24 hr capsule TAKE 2 CAPSULES BY MOUTH DAILY WITH BREAKFAST (Patient taking differently: 1 cap PO QOD) 60 capsule 5   No current facility-administered medications on file  prior to visit.   Allergies  Allergen Reactions  . Levaquin [Levofloxacin In D5w] Shortness Of Breath   Social History   Social History  . Marital Status: Married    Spouse Name: N/A  . Number of Children: 2  . Years of Education: N/A   Occupational History  . part sales    Social History Main Topics  . Smoking status: Former Smoker -- 1.00 packs/day for 30 years    Types: Cigarettes, Cigars    Quit date: 05/13/1998  . Smokeless tobacco: Current User    Types: Chew  . Alcohol Use: Yes     Comment: freq  . Drug Use: No  . Sexual Activity: Not on file   Other Topics Concern  . Not on file   Social History Narrative      Review of Systems  All other systems reviewed and are negative.      Objective:   Physical Exam  Cardiovascular: Normal rate, regular rhythm and normal heart sounds.   Pulses:      Femoral pulses are 2+ on the right side, and 2+ on the left side.      Popliteal pulses are 0 on the right side, and 1+ on the left side.       Dorsalis pedis pulses are 2+ on the right side, and 2+ on the left side.       Posterior tibial pulses are 1+ on the right side, and 1+ on the left side.  Pulmonary/Chest: Effort normal and breath sounds normal.  Abdominal: Soft. Bowel sounds are normal. He exhibits no distension. There is no tenderness. There is no rebound.  Musculoskeletal:       Right upper leg: He exhibits tenderness.  He exhibits no bony tenderness.       Left upper leg: He exhibits tenderness. He exhibits no bony tenderness.       Right lower leg: He exhibits tenderness.       Left lower leg: He exhibits tenderness.  Vitals reviewed.         Assessment & Plan:  Arthralgia of both knees  Elevated CK  Bilateral leg pain  Using sterile technique, I injected the right knee with a mixture of 2 mL of lidocaine, 2 mL of Marcaine, and 2 mL of 40 mg per mL Kenalog. Patient tolerated the procedure well without complication. Then, using sterile technique, I injected the left knee with the exact same mixture. Patient tolerated the second injection without difficulty. I will schedule the patient for physical therapy. If his pain improves and the range of motion in his knees and lower legs improve, no further workup is necessary. If symptoms persist consider an MRI of the knee versus an orthopedic consultation versus EMG nerve conduction studies depending on the pain pattern

## 2015-03-28 ENCOUNTER — Ambulatory Visit (INDEPENDENT_AMBULATORY_CARE_PROVIDER_SITE_OTHER): Payer: PPO | Admitting: Family Medicine

## 2015-03-28 ENCOUNTER — Encounter: Payer: Self-pay | Admitting: Family Medicine

## 2015-03-28 VITALS — BP 110/66 | HR 94 | Temp 98.5°F | Resp 16 | Ht 72.0 in | Wt 220.0 lb

## 2015-03-28 DIAGNOSIS — J208 Acute bronchitis due to other specified organisms: Secondary | ICD-10-CM | POA: Diagnosis not present

## 2015-03-28 MED ORDER — DOXYCYCLINE HYCLATE 100 MG PO TABS
100.0000 mg | ORAL_TABLET | Freq: Two times a day (BID) | ORAL | Status: DC
Start: 2015-03-28 — End: 2015-04-18

## 2015-03-28 MED ORDER — HYDROCODONE-HOMATROPINE 5-1.5 MG/5ML PO SYRP: 5.0000 mL | ORAL_SOLUTION | Freq: Three times a day (TID) | ORAL | Status: DC | PRN

## 2015-03-28 NOTE — Progress Notes (Signed)
   Subjective:    Patient ID: [name], male    DOB: [birth year], 65 y.o.   MRN: [id number]  HPI  Patient symptoms began 1 week ago. He has a terrible nonproductive cough. He reports pleurisy. He reports shortness of breath. He reports subjective fevers. He also has a sore throat from coughing along with chest congestion.  Past Medical History  Diagnosis Date  . Diverticulosis 2011  . Former smoker   . HLD (hyperlipidemia)   . Allergy   . Melanoma in situ ([place])   . Adenomatous colon polyp 2004  . Hyperplastic colon polyp 2004   Past Surgical History  Procedure Laterality Date  . Colonoscopy  2011    SEVERAL    Current Outpatient Prescriptions on File Prior to Visit  Medication Sig Dispense Refill  . cetirizine (ZYRTEC) 10 MG tablet Take 1 tablet (10 mg total) by mouth daily. 30 tablet 11  . venlafaxine XR (EFFEXOR-XR) 75 MG 24 hr capsule TAKE 2 CAPSULES BY MOUTH DAILY WITH BREAKFAST (Patient taking differently: 1 Cap twice a week) 60 capsule 5   No current facility-administered medications on file prior to visit.   Allergies  Allergen Reactions  . Levaquin [Levofloxacin In D5w] Shortness Of Breath   Social History   Social History  . Marital Status: Married    Spouse Name: N/A  . Number of Children: 2  . Years of Education: N/A   Occupational History  . part sales    Social History Main Topics  . Smoking status: Former Smoker -- 1.00 packs/day for 30 years    Types: Cigarettes, Cigars    Quit date: 05/13/1998  . Smokeless tobacco: Current User    Types: Chew  . Alcohol Use: Yes     Comment: freq  . Drug Use: No  . Sexual Activity: Not on file   Other Topics Concern  . Not on file   Social History Narrative      Review of Systems  All other systems reviewed and are negative.      Objective:   Physical Exam  Constitutional: He appears well-developed and well-nourished.  HENT:  Right Ear: External ear normal.  Left Ear: External ear normal.    Nose: Nose normal.  Mouth/Throat: Oropharynx is clear and moist.  Eyes: Conjunctivae are normal.  Neck: Neck supple.  Cardiovascular: Normal rate, regular rhythm and normal heart sounds.   Pulmonary/Chest: Effort normal and breath sounds normal. No respiratory distress. He has no wheezes. He has no rales.  Lymphadenopathy:    He has no cervical adenopathy.  Vitals reviewed.         Assessment & Plan:  Acute bronchitis due to other specified organisms - Plan: doxycycline (VIBRA-TABS) 100 MG tablet, HYDROcodone-homatropine (HYCODAN) 5-1.5 MG/5ML syrup   Symptoms are consistent with bronchitis. Begin doxycycline 100 mg by mouth twice a day for 10 days and Hycodan 1 teaspoon every 6-8 hours as needed for cough.

## 2015-04-17 ENCOUNTER — Telehealth: Payer: Self-pay | Admitting: Family Medicine

## 2015-04-17 NOTE — Telephone Encounter (Signed)
Patient is calling to ask some questions about last time he was seen he was diagnosed with bronchitis, since that time has had a lot of dizziness   Please call him back at [phone]

## 2015-04-17 NOTE — Telephone Encounter (Signed)
Pt states that he is not really having any sinus problems. Recommended appt, pt agreed and appt made.

## 2015-04-17 NOTE — Telephone Encounter (Signed)
Called and spoke to pt and he states that he is still having dizziness and HA's. This has been going on since is LOV 03/28/15. He was wondering if this is suppose to last this long or is there something else he can take? He is not currently taking anything otc.

## 2015-04-17 NOTE — Telephone Encounter (Signed)
When I saw the patient he was having bronchitis. Dizziness and headaches should be better by now and are really not associated with bronchitis. Does he think he may have a sinus infection?

## 2015-04-18 ENCOUNTER — Encounter: Payer: Self-pay | Admitting: Family Medicine

## 2015-04-18 ENCOUNTER — Ambulatory Visit (INDEPENDENT_AMBULATORY_CARE_PROVIDER_SITE_OTHER): Payer: PPO | Admitting: Family Medicine

## 2015-04-18 VITALS — BP 118/70 | HR 82 | Temp 98.0°F | Resp 16 | Ht 72.0 in | Wt 222.0 lb

## 2015-04-18 DIAGNOSIS — J0120 Acute ethmoidal sinusitis, unspecified: Secondary | ICD-10-CM | POA: Diagnosis not present

## 2015-04-18 MED ORDER — PREDNISONE 20 MG PO TABS: ORAL_TABLET | ORAL | Status: DC

## 2015-04-18 MED ORDER — CEFDINIR 300 MG PO CAPS: 300.0000 mg | ORAL_CAPSULE | Freq: Two times a day (BID) | ORAL | Status: DC

## 2015-04-18 NOTE — Progress Notes (Signed)
Subjective:    Patient ID: [name], male    DOB: 1950/06/23, 65 y.o.   MRN: [id number]  HPI Patient has had a dull headache for one month. The headache is located deep inside his head between his eyes. He reports pressure whenever he bends over. He reports dizziness whenever he bends over. He reports head congestion and rhinorrhea. Symptoms began after he had an upper respiratory infection. He denies any double vision. He denies any vertigo. He denies any tinnitus or hearing loss. He denies any fevers or chills. He does report sinus congestion and rhinorrhea Past Medical History  Diagnosis Date  . Diverticulosis 2011  . Former smoker   . HLD (hyperlipidemia)   . Allergy   . Melanoma in situ ([place])   . Adenomatous colon polyp 2004  . Hyperplastic colon polyp 2004   Past Surgical History  Procedure Laterality Date  . Colonoscopy  2011    SEVERAL    Current Outpatient Prescriptions on File Prior to Visit  Medication Sig Dispense Refill  . cetirizine (ZYRTEC) 10 MG tablet Take 1 tablet (10 mg total) by mouth daily. 30 tablet 11  . venlafaxine XR (EFFEXOR-XR) 75 MG 24 hr capsule TAKE 2 CAPSULES BY MOUTH DAILY WITH BREAKFAST (Patient taking differently: 1 Cap twice a week) 60 capsule 5  . HYDROcodone-homatropine (HYCODAN) 5-1.5 MG/5ML syrup Take 5 mLs by mouth every 8 (eight) hours as needed for cough. (Patient not taking: Reported on 04/18/2015) 120 mL 0   No current facility-administered medications on file prior to visit.   Allergies  Allergen Reactions  . Levaquin [Levofloxacin In D5w] Shortness Of Breath   Social History   Social History  . Marital Status: Married    Spouse Name: N/A  . Number of Children: 2  . Years of Education: N/A   Occupational History  . part sales    Social History Main Topics  . Smoking status: Former Smoker -- 1.00 packs/day for 30 years    Types: Cigarettes, Cigars    Quit date: 05/13/1998  . Smokeless tobacco: Current User    Types:  Chew  . Alcohol Use: Yes     Comment: freq  . Drug Use: No  . Sexual Activity: Not on file   Other Topics Concern  . Not on file   Social History Narrative      Review of Systems  All other systems reviewed and are negative.      Objective:   Physical Exam  Constitutional: He appears well-developed and well-nourished. No distress.  HENT:  Right Ear: Tympanic membrane, external ear and ear canal normal.  Left Ear: Tympanic membrane, external ear and ear canal normal.  Nose: Mucosal edema and rhinorrhea present. Right sinus exhibits no maxillary sinus tenderness and no frontal sinus tenderness. Left sinus exhibits no maxillary sinus tenderness and no frontal sinus tenderness.  Mouth/Throat: Oropharynx is clear and moist. No oropharyngeal exudate.  Eyes: Conjunctivae are normal. Pupils are equal, round, and reactive to light.  Neck: Neck supple.  Cardiovascular: Normal rate, regular rhythm and normal heart sounds.   Pulmonary/Chest: Effort normal and breath sounds normal. No respiratory distress. He has no wheezes. He has no rales.  Lymphadenopathy:    He has no cervical adenopathy.  Skin: He is not diaphoretic.  Vitals reviewed.         Assessment & Plan:  Acute ethmoidal sinusitis, recurrence not specified - Plan: cefdinir (OMNICEF) 300 MG capsule, predniSONE (DELTASONE) 20 MG tablet  Patient  recently had an upper respiratory infection. I'm concerned he may have developed ethmoid sinusitis. Begin Omnicef and a prednisone taper pack. If symptoms do not improve quickly, I would like to proceed with a CT scan of the head to rule out intracranial pathology but his neurologic exam today is normal.

## 2015-05-01 ENCOUNTER — Other Ambulatory Visit: Payer: Self-pay | Admitting: Family Medicine

## 2015-05-01 ENCOUNTER — Telehealth: Payer: Self-pay | Admitting: Family Medicine

## 2015-05-01 DIAGNOSIS — R42 Dizziness and giddiness: Secondary | ICD-10-CM

## 2015-05-01 DIAGNOSIS — G4489 Other headache syndrome: Secondary | ICD-10-CM

## 2015-05-01 NOTE — Telephone Encounter (Signed)
Continues to have a pounding headache in his occiput now x 6 weeks and dizziness on a daily basis.  Will schedule head ct.

## 2015-05-01 NOTE — Telephone Encounter (Signed)
Pt called to let Dr. Dennard Schaumann know that he has been having headaches every day. They range from mild to severe. Would like to speak with Dr. Dennard Schaumann before scheduling an appointment. [phone]

## 2015-05-02 ENCOUNTER — Ambulatory Visit
Admission: RE | Admit: 2015-05-02 | Discharge: 2015-05-02 | Disposition: A | Discharge status: Home / Self Care | Payer: PPO | Source: Ambulatory Visit | Attending: Family Medicine | Admitting: Family Medicine

## 2015-05-02 DIAGNOSIS — R51 Headache: Secondary | ICD-10-CM | POA: Diagnosis not present

## 2015-05-02 DIAGNOSIS — G4489 Other headache syndrome: Secondary | ICD-10-CM

## 2015-05-02 DIAGNOSIS — R42 Dizziness and giddiness: Secondary | ICD-10-CM

## 2015-05-05 ENCOUNTER — Telehealth: Payer: Self-pay | Admitting: Family Medicine

## 2015-05-05 NOTE — Telephone Encounter (Signed)
Rec'd Authorization Confirmation Fax form  For 70450-Ct head/brain w/o dye   Auth [id number]  [facility]  1 visit  05/01/15 - 10/28/15   ICD G44.89 other HA syndrome and R42  Dizziness and Giddiness

## 2015-05-26 ENCOUNTER — Other Ambulatory Visit: Payer: Self-pay | Admitting: Family Medicine

## 2015-08-15 ENCOUNTER — Encounter: Payer: Self-pay | Admitting: Family Medicine

## 2015-08-15 ENCOUNTER — Ambulatory Visit (INDEPENDENT_AMBULATORY_CARE_PROVIDER_SITE_OTHER): Payer: PPO | Admitting: Family Medicine

## 2015-08-15 VITALS — BP 132/90 | HR 76 | Temp 97.9°F | Resp 20 | Wt 227.0 lb

## 2015-08-15 DIAGNOSIS — R030 Elevated blood-pressure reading, without diagnosis of hypertension: Secondary | ICD-10-CM

## 2015-08-15 DIAGNOSIS — IMO0001 Reserved for inherently not codable concepts without codable children: Secondary | ICD-10-CM

## 2015-08-15 LAB — BASIC METABOLIC PANEL WITH GFR
BUN: 19 mg/dL (ref 7–25)
CO2: 23 mmol/L (ref 20–31)
Calcium: 9.2 mg/dL (ref 8.6–10.3)
Chloride: 105 mmol/L (ref 98–110)
Creat: 1.04 mg/dL (ref 0.70–1.25)
GFR, Est African American: 86 mL/min (ref >=60)
GFR, Est Non African American: 74 mL/min (ref >=60)
Glucose, Bld: 94 mg/dL (ref 70–99)
Potassium: 4.6 mmol/L (ref 3.5–5.3)
Sodium: 139 mmol/L (ref 135–146)

## 2015-08-15 MED ORDER — LOSARTAN POTASSIUM 50 MG PO TABS: 50.0000 mg | ORAL_TABLET | Freq: Every day | ORAL | 3 refills | Status: DC

## 2015-08-15 NOTE — Progress Notes (Signed)
   Subjective:    Patient ID: [name], male    DOB: August 16, 1950, 65 y.o.   MRN: [id number]  HPI Over last 2 weeks, the patient is found his blood pressure is elevated at home. It is between 140 and 150/80-95. He denies any dietary changes or other lifestyle changes that can account for the elevated blood pressure. He is not taking any medications that could raise his blood pressure. He denies any chest pain shortness of breath or dyspnea on exertion Past Medical History:  Diagnosis Date  . Adenomatous colon polyp 2004  . Allergy   . Diverticulosis 2011  . Former smoker   . HLD (hyperlipidemia)   . Hyperplastic colon polyp 2004  . Melanoma in situ [facility])    Past Surgical History:  Procedure Laterality Date  . COLONOSCOPY  2011   SEVERAL    Current Outpatient Prescriptions on File Prior to Visit  Medication Sig Dispense Refill  . cefdinir (OMNICEF) 300 MG capsule Take 1 capsule (300 mg total) by mouth 2 (two) times daily. 20 capsule 0  . cetirizine (ZYRTEC) 10 MG tablet TAKE 1 TABLET BY MOUTH DAILY 30 tablet 10  . HYDROcodone-homatropine (HYCODAN) 5-1.5 MG/5ML syrup Take 5 mLs by mouth every 8 (eight) hours as needed for cough. (Patient not taking: Reported on 04/18/2015) 120 mL 0  . predniSONE (DELTASONE) 20 MG tablet 3 tabs poqday 1-2, 2 tabs poqday 3-4, 1 tab poqday 5-6 12 tablet 0  . venlafaxine XR (EFFEXOR-XR) 75 MG 24 hr capsule TAKE 2 CAPSULES BY MOUTH DAILY WITH BREAKFAST (Patient taking differently: 1 Cap twice a week) 60 capsule 5   No current facility-administered medications on file prior to visit.    Allergies  Allergen Reactions  . Levaquin [Levofloxacin In D5w] Shortness Of Breath   Social History   Social History  . Marital status: Married    Spouse name: N/A  . Number of children: 2  . 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 Smoking status: Former Smoker    Packs/day: 1.00    Years: 30.00      Types: Cigarettes, Cigars    Quit date: 05/13/1998  . Smokeless tobacco: Current User    Types: Chew  . Alcohol use Yes     Comment: freq  . Drug use: No  . Sexual activity: Not on file   Other Topics Concern  . Not on file   Social History Narrative  . No narrative on file     Review of Systems  All other systems reviewed and are negative.      Objective:   Physical Exam  Constitutional: He appears well-developed and well-nourished.  Neck: Neck supple. No JVD present. No thyromegaly present.  Cardiovascular: Normal rate, regular rhythm and normal heart sounds.   No murmur heard. Pulmonary/Chest: Effort normal and breath sounds normal. No respiratory distress. He has no wheezes. He has no rales.  Abdominal: Soft. Bowel sounds are normal.  Musculoskeletal: He exhibits no edema.  Lymphadenopathy:    He has no cervical adenopathy.  Vitals reviewed.         Assessment & Plan:  Elevated blood pressure  I personally rechecked his blood pressure and found it to be 142/88. I will start the patient on low-dose losartan 50 mg by mouth daily and recheck his blood pressure in one month. Check a BMP today as well

## 2015-08-21 ENCOUNTER — Telehealth: Payer: Self-pay | Admitting: Family Medicine

## 2015-08-21 DIAGNOSIS — E785 Hyperlipidemia, unspecified: Secondary | ICD-10-CM

## 2015-08-21 NOTE — Telephone Encounter (Signed)
CB# [id number]  Patient states he would like a complete lab work done he states he went two weeks ago for a job and they told him his triglycerides and cholosterol was high. He wants to see if they have came down.

## 2015-08-22 NOTE — Telephone Encounter (Signed)
Spoke to pt and he wanted to have lipid panel added to his labs unfortunately the blood has been discarded and he will need to have it redrawn. Pt aware he will need to have it redrawn.

## 2015-08-23 ENCOUNTER — Other Ambulatory Visit: Payer: PPO

## 2015-08-23 DIAGNOSIS — E785 Hyperlipidemia, unspecified: Secondary | ICD-10-CM | POA: Diagnosis not present

## 2015-08-23 LAB — LIPID PANEL
Cholesterol: 213 mg/dL — ABNORMAL HIGH (ref 125–200)
HDL: 37 mg/dL — ABNORMAL LOW (ref >=40)
LDL Cholesterol: 137 mg/dL — ABNORMAL HIGH (ref <130)
Total CHOL/HDL Ratio: 5.8 Ratio — ABNORMAL HIGH (ref <=5.0)
Triglycerides: 193 mg/dL — ABNORMAL HIGH (ref <150)
VLDL: 39 mg/dL — ABNORMAL HIGH (ref <30)

## 2015-08-25 ENCOUNTER — Encounter: Payer: Self-pay | Admitting: Family Medicine

## 2015-10-06 ENCOUNTER — Encounter: Payer: Self-pay | Admitting: Family Medicine

## 2015-10-06 ENCOUNTER — Ambulatory Visit (INDEPENDENT_AMBULATORY_CARE_PROVIDER_SITE_OTHER): Payer: PPO | Admitting: Family Medicine

## 2015-10-06 VITALS — BP 118/60 | HR 98 | Temp 98.4°F | Resp 18 | Ht 72.0 in | Wt 232.0 lb

## 2015-10-06 DIAGNOSIS — M25562 Pain in left knee: Secondary | ICD-10-CM | POA: Diagnosis not present

## 2015-10-06 DIAGNOSIS — R000 Tachycardia, unspecified: Secondary | ICD-10-CM | POA: Diagnosis not present

## 2015-10-06 DIAGNOSIS — M25561 Pain in right knee: Secondary | ICD-10-CM | POA: Diagnosis not present

## 2015-10-06 MED ORDER — METOPROLOL SUCCINATE ER 25 MG PO TB24: 25.0000 mg | ORAL_TABLET | Freq: Every day | ORAL | 3 refills | Status: DC

## 2015-10-06 NOTE — Progress Notes (Signed)
Subjective:    Patient ID: [name], male    DOB: 12/11/1950, 65 y.o.   MRN: [id number]  HPI Patient reports bilateral knee pain. His last cortisone injection in his knee was in December. He is requesting a repeat cortisone injection. His right knee has a significant effusion. He is complaining of posterior lateral knee pain in both knees which sounds like it may be hamstring tightness as well but I think this may be do to the way he is compensating walking on his knee. His blood pressure at home is also been elevated. I checked his blood pressure personally today and found to be 130/72. However he does have a high resting heart rate in the mid 90s. Past Medical History:  Diagnosis Date  . Adenomatous colon polyp 2004  . Allergy   . Diverticulosis 2011  . Former smoker   . HLD (hyperlipidemia)   . Hyperplastic colon polyp 2004  . Melanoma in situ [facility])    Past Surgical History:  Procedure Laterality Date  . COLONOSCOPY  2011   SEVERAL    Current Outpatient Prescriptions on File Prior to Visit  Medication Sig Dispense Refill  . cetirizine (ZYRTEC) 10 MG tablet TAKE 1 TABLET BY MOUTH DAILY 30 tablet 10  . losartan (COZAAR) 50 MG tablet Take 1 tablet (50 mg total) by mouth daily. 90 tablet 3   No current facility-administered medications on file prior to visit.    Allergies  Allergen Reactions  . Levaquin [Levofloxacin In D5w] Shortness Of Breath   Social History   Social History  . Marital status: Married    Spouse name: N/A  . Number of children: 2  . 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 Smoking status: Former Smoker    Packs/day: 1.00    Years: 30.00    Types: Cigarettes, Cigars    Quit date: 05/13/1998  . Smokeless tobacco: Current User    Types: Chew  . Alcohol use Yes     Comment: freq  . Drug use: No  . Sexual activity: Not on file   Other Topics Concern  . Not on file   Social  History Narrative  . No narrative on file     Review of Systems  All other systems reviewed and are negative.      Objective:   Physical Exam  Constitutional: He appears well-developed and well-nourished.  Neck: Neck supple. No JVD present. No thyromegaly present.  Cardiovascular: Normal rate, regular rhythm and normal heart sounds.   No murmur heard. Pulmonary/Chest: Effort normal and breath sounds normal. No respiratory distress. He has no wheezes. He has no rales.  Abdominal: Soft. Bowel sounds are normal.  Musculoskeletal: He exhibits no edema.  Lymphadenopathy:    He has no cervical adenopathy.  Vitals reviewed.    Tachycardia - Plan: metoprolol succinate (TOPROL-XL) 25 MG 24 hr tablet, CBC with Differential/Platelet, COMPLETE METABOLIC PANEL WITH GFR, TSH  Due to the patient's high resting heart rate, I'll check a CBC to rule out anemia, TSH to rule out thyroid abnormalities, and a CMP to evaluate for signs of dehydration although clinically the patient has none of these. His bilateral knee pain is most likely secondary to osteoarthritis. Using sterile technique, I injected the left knee with 2 mL of lidocaine, 2 mL of Marcaine, and 2 mL of 40 mg per mL Kenalog. Patient tolerated procedure without complication. I  then repeated the procedure on his right knee.

## 2015-10-07 LAB — TSH: TSH: 3.38 mIU/L (ref 0.40–4.50)

## 2015-10-07 LAB — CBC WITH DIFFERENTIAL/PLATELET
Basophils Absolute: 104 cells/uL (ref 0–200)
Basophils Relative: 1 %
Eosinophils Absolute: 312 cells/uL (ref 15–500)
Eosinophils Relative: 3 %
HCT: 44.9 % (ref 38.5–50.0)
Hemoglobin: 14.9 g/dL (ref 13.0–17.0)
Lymphocytes Relative: 27 %
Lymphs Abs: 2808 cells/uL (ref 850–3900)
MCH: 28.3 pg (ref 27.0–33.0)
MCHC: 33.2 g/dL (ref 32.0–36.0)
MCV: 85.4 fL (ref 80.0–100.0)
MPV: 10.4 fL (ref 7.5–12.5)
Monocytes Absolute: 832 cells/uL (ref 200–950)
Monocytes Relative: 8 %
Neutro Abs: 6344 cells/uL (ref 1500–7800)
Neutrophils Relative %: 61 %
Platelets: 312 10*3/uL (ref 140–400)
RBC: 5.26 MIL/uL (ref 4.20–5.80)
RDW: 13.8 % (ref 11.0–15.0)
WBC: 10.4 10*3/uL (ref 3.8–10.8)

## 2015-10-07 LAB — COMPLETE METABOLIC PANEL WITH GFR
ALT: 17 U/L (ref 9–46)
AST: 19 U/L (ref 10–35)
Albumin: 4 g/dL (ref 3.6–5.1)
Alkaline Phosphatase: 68 U/L (ref 40–115)
BUN: 17 mg/dL (ref 7–25)
CO2: 23 mmol/L (ref 20–31)
Calcium: 9.3 mg/dL (ref 8.6–10.3)
Chloride: 105 mmol/L (ref 98–110)
Creat: 1.26 mg/dL — ABNORMAL HIGH (ref 0.70–1.25)
GFR, Est African American: 68 mL/min (ref >=60)
GFR, Est Non African American: 59 mL/min — ABNORMAL LOW (ref >=60)
Glucose, Bld: 101 mg/dL — ABNORMAL HIGH (ref 70–99)
Potassium: 4.4 mmol/L (ref 3.5–5.3)
Sodium: 138 mmol/L (ref 135–146)
Total Bilirubin: 0.6 mg/dL (ref 0.2–1.2)
Total Protein: 6.3 g/dL (ref 6.1–8.1)

## 2015-10-11 ENCOUNTER — Encounter: Payer: Self-pay | Admitting: Family Medicine

## 2015-11-28 ENCOUNTER — Telehealth: Payer: Self-pay | Admitting: Family Medicine

## 2015-11-28 NOTE — Telephone Encounter (Signed)
Pt called and states that the metoprolol maybe needs to be increased as his HR is still 90-100. His BP is on average 135-140/85-90.

## 2015-11-30 MED ORDER — METOPROLOL SUCCINATE ER 50 MG PO TB24: 50.0000 mg | ORAL_TABLET | Freq: Every day | ORAL | 3 refills | Status: DC

## 2015-11-30 NOTE — Telephone Encounter (Signed)
Patient aware of providers recommendations and med sent to pharm 

## 2015-11-30 NOTE — Telephone Encounter (Signed)
I agree, increase to 50 poqday and recheck in 1 week.

## 2016-02-01 ENCOUNTER — Ambulatory Visit (INDEPENDENT_AMBULATORY_CARE_PROVIDER_SITE_OTHER): Payer: PPO | Admitting: Family Medicine

## 2016-02-01 ENCOUNTER — Encounter: Payer: Self-pay | Admitting: Family Medicine

## 2016-02-01 VITALS — BP 122/64 | HR 80 | Temp 98.6°F | Resp 16 | Ht 72.0 in | Wt 234.0 lb

## 2016-02-01 DIAGNOSIS — M25561 Pain in right knee: Secondary | ICD-10-CM

## 2016-02-01 DIAGNOSIS — M25562 Pain in left knee: Secondary | ICD-10-CM

## 2016-02-01 DIAGNOSIS — G8929 Other chronic pain: Secondary | ICD-10-CM

## 2016-02-02 NOTE — Progress Notes (Signed)
Subjective:    Patient ID: [name], male    DOB: 02/23/1950, 66 y.o.   MRN: [id number]  HPI  10/06/15 Patient reports bilateral knee pain. His last cortisone injection in his knee was in December. He is requesting a repeat cortisone injection. His right knee has a significant effusion. He is complaining of posterior lateral knee pain in both knees which sounds like it may be hamstring tightness as well but I think this may be do to the way he is compensating walking on his knee. His blood pressure at home is also been elevated. I checked his blood pressure personally today and found to be 130/72. However he does have a high resting heart rate in the mid 90s..  AT that time, my plan was:  Due to the patient's high resting heart rate, I'll check a CBC to rule out anemia, TSH to rule out thyroid abnormalities, and a CMP to evaluate for signs of dehydration although clinically the patient has none of these. His bilateral knee pain is most likely secondary to osteoarthritis. Using sterile technique, I injected the left knee with 2 mL of lidocaine, 2 mL of Marcaine, and 2 mL of 40 mg per mL Kenalog. Patient tolerated procedure without complication. I then repeated the procedure on his right knee.  02/02/15 Requesting a repeat cortisone injection in both knees. He reports knee pain with flexion and extension. It is worse with prolonged standing. It is worse with squatting. It is worse going up and down steps. He denies any injuries or falls Past Medical History:  Diagnosis Date  . Adenomatous colon polyp 2004  . Allergy   . Diverticulosis 2011  . Former smoker   . HLD (hyperlipidemia)   . Hyperplastic colon polyp 2004  . Melanoma in situ [facility])    Past Surgical History:  Procedure Laterality Date  . COLONOSCOPY  2011   SEVERAL    Current Outpatient Prescriptions on File Prior to Visit  Medication Sig Dispense Refill  . cetirizine (ZYRTEC) 10 MG tablet TAKE 1 TABLET BY MOUTH DAILY 30 tablet  10  . losartan (COZAAR) 50 MG tablet Take 1 tablet (50 mg total) by mouth daily. 90 tablet 3  . metoprolol succinate (TOPROL-XL) 50 MG 24 hr tablet Take 1 tablet (50 mg total) by mouth daily. Take with or immediately following a meal. 90 tablet 3   No current facility-administered medications on file prior to visit.    Allergies  Allergen Reactions  . Levaquin [Levofloxacin In D5w] Shortness Of Breath   Social History   Social History  . Marital status: Married    Spouse name: N/A  . Number of children: 2  . 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 Smoking status: Former Smoker    Packs/day: 1.00    Years: 30.00    Types: Cigarettes, Cigars    Quit date: 05/13/1998  . Smokeless tobacco: Current User    Types: Chew  . Alcohol use Yes     Comment: freq  . Drug use: No  . Sexual activity: Not on file   Other Topics Concern  . Not on file   Social History Narrative  . No narrative on file     Review of Systems  All other systems reviewed and are negative.      Objective:   Physical Exam  Constitutional: He appears well-developed and well-nourished.  Cardiovascular: Normal  rate, regular rhythm and normal heart sounds.   No murmur heard. Pulmonary/Chest: Effort normal and breath sounds normal. No respiratory distress. He has no wheezes. He has no rales.  Abdominal: Soft. Bowel sounds are normal.  Musculoskeletal:       Right knee: He exhibits decreased range of motion and effusion. Tenderness found. Medial joint line and lateral joint line tenderness noted.       Left knee: He exhibits decreased range of motion and effusion. Tenderness found. Medial joint line and lateral joint line tenderness noted.  Vitals reviewed.    Chronic pain of both knees  Using sterile technique, I injected the right knee with 2 mL of lidocaine, 2 mL of Marcaine, and 2 mL of 40 mg per mL Kenalog. I then injected his left  knee with 2 mL of lidocaine, 2 mL Marcaine, and 2 mL of 40 mg per mL Kenalog. Patient tolerated both procedures complication.

## 2016-04-09 ENCOUNTER — Ambulatory Visit (INDEPENDENT_AMBULATORY_CARE_PROVIDER_SITE_OTHER): Payer: PPO | Admitting: Family Medicine

## 2016-04-09 ENCOUNTER — Encounter: Payer: Self-pay | Admitting: Family Medicine

## 2016-04-09 VITALS — BP 138/90 | HR 84 | Temp 98.0°F | Resp 16 | Ht 72.0 in | Wt 220.0 lb

## 2016-04-09 DIAGNOSIS — R5382 Chronic fatigue, unspecified: Secondary | ICD-10-CM

## 2016-04-09 DIAGNOSIS — M216X2 Other acquired deformities of left foot: Secondary | ICD-10-CM | POA: Diagnosis not present

## 2016-04-09 NOTE — Progress Notes (Signed)
Subjective:    Patient ID: [name], male    DOB: 1950/09/09, 66 y.o.   MRN: [id number]  HPI  A she reports pain in his left foot and his left ankle. He reports swelling in left ankle. After a prolonged day on his feet, his ankle hurt. He denies any crepitus. He denies any injury. On examination he has hyperpronation of the left foot with collapse of the left ankle with in turning of the medial malleolus is again abnormal gait and excessive strain on the medial aspect of his ankle joint. There is also swelling and tenderness over the medial midfoot. He also reports chronic fatigue like to have his testosterone checked. He also reports urinary frequency and urgency and a good urinary flow. He denies any hesitancy or weak stream. Past Medical History:  Diagnosis Date  . Adenomatous colon polyp 2004  . Allergy   . Diverticulosis 2011  . Former smoker   . HLD (hyperlipidemia)   . Hyperplastic colon polyp 2004  . Melanoma in situ [facility])    Past Surgical History:  Procedure Laterality Date  . COLONOSCOPY  2011   SEVERAL    Current Outpatient Prescriptions on File Prior to Visit  Medication Sig Dispense Refill  . cetirizine (ZYRTEC) 10 MG tablet TAKE 1 TABLET BY MOUTH DAILY 30 tablet 10  . losartan (COZAAR) 50 MG tablet Take 1 tablet (50 mg total) by mouth daily. 90 tablet 3  . metoprolol succinate (TOPROL-XL) 50 MG 24 hr tablet Take 1 tablet (50 mg total) by mouth daily. Take with or immediately following a meal. 90 tablet 3   No current facility-administered medications on file prior to visit.    Allergies  Allergen Reactions  . Levaquin [Levofloxacin In D5w] Shortness Of Breath   Social History   Social History  . Marital status: Married    Spouse name: N/A  . Number of children: 2  . 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 Smoking status: Former Smoker    Packs/day: 1.00    Years: 30.00    Types:  Cigarettes, Cigars    Quit date: 05/13/1998  . Smokeless tobacco: Current User    Types: Chew  . Alcohol use Yes     Comment: freq  . Drug use: No  . Sexual activity: Not on file   Other Topics Concern  . Not on file   Social History Narrative  . No narrative on file     Review of Systems  All other systems reviewed and are negative.      Objective:   Physical Exam  Constitutional: He appears well-developed and well-nourished.  Cardiovascular: Normal rate, regular rhythm and normal heart sounds.   No murmur heard. Pulmonary/Chest: Effort normal and breath sounds normal. No respiratory distress. He has no wheezes. He has no rales.  Abdominal: Soft. Bowel sounds are normal.  Musculoskeletal:       Left ankle: He exhibits swelling, ecchymosis and deformity. Tenderness. Medial malleolus and AITFL tenderness found.  Vitals reviewed.   Patient has hyperpronation of the left foot with in turning of the left ankle causing an abnormal gait and pain in the medial aspect of ankle joint Chronic fatigue - Plan: Testosterone, PSA  Hindfoot pronation of left foot - Plan: Ambulatory referral to Sports Medicine I will consult sports medicine clinic for possible orthotics to correct the hyperpronation of the left ankle which  is causing him ankle pain due to the improper posture and abnormal distribution of the weight on the ankle joint. Given his polyuria I will check a PSA although I doubt BPH. His last blood sugar was relatively normal. I suspect overactive bladder and if his PSA is normal, I will give the patient samples of myrbetriq.

## 2016-04-10 LAB — PSA: PSA: 0.9 ng/mL (ref <=4.0)

## 2016-04-10 LAB — TESTOSTERONE: Testosterone: 291 ng/dL (ref 250–827)

## 2016-04-11 ENCOUNTER — Encounter: Payer: Self-pay | Admitting: Family Medicine

## 2016-04-16 ENCOUNTER — Encounter: Payer: Self-pay | Admitting: Sports Medicine

## 2016-04-16 ENCOUNTER — Ambulatory Visit (INDEPENDENT_AMBULATORY_CARE_PROVIDER_SITE_OTHER): Payer: PPO | Admitting: Sports Medicine

## 2016-04-16 VITALS — BP 119/76 | Ht 72.0 in | Wt 220.0 lb

## 2016-04-16 DIAGNOSIS — M79672 Pain in left foot: Secondary | ICD-10-CM | POA: Diagnosis not present

## 2016-04-16 DIAGNOSIS — M76822 Posterior tibial tendinitis, left leg: Secondary | ICD-10-CM

## 2016-04-16 DIAGNOSIS — M79671 Pain in right foot: Secondary | ICD-10-CM | POA: Diagnosis not present

## 2016-04-16 MED ORDER — PREDNISONE 10 MG PO TABS: ORAL_TABLET | ORAL | 0 refills | Status: DC

## 2016-04-16 NOTE — Progress Notes (Signed)
Subjective:    [name] is a 66 y.o. old male here for left foot pain  HPI [name] is a 66 yo male with history of hypertension and tachycardia who presents with left foot pain for 1-1/2 months. No inciting factor as far as the patient remembers. He never had similar pain before. No foot wear or activity changes. He works with [employer] in [employer]. She is on his feet 50% of the time. He describes the pain as sharp. Hurts when he walks barefoot. He says he usually walks barefoot in the house. Pain improves with his shoe on. Pain is over the medial aspect of his midfoot below his medial malleolus. Noted swelling as well. He also reports mild tingling in his left foot.  He had history of bilateral knee osteoarthritis and gets cortisone shots every 4 months. He denies fever or chills. Denies history of gout or foot or ankle injury. Denies history of diabetes. Has not tried any medication for his pain yet. PMH/Problem List: has HYPERLIPIDEMIA; ERECTILE DYSFUNCTION; TOBACCO ABUSE; LABILE HYPERTENSION; SINUSITIS- ACUTE-NOS; URI; GERD; DIVERTICULOSIS, COLON; DIVERTICULITIS, ACUTE; ABDOMINAL PAIN, LEFT LOWER QUADRANT; Diverticulosis; Former smoker; HLD (hyperlipidemia); Allergy; Shortness of breath; Allergic rhinitis; Melanoma in situ ([place]); LLQ abdominal pain; and Right foot pain on his problem list.   has a past medical history of Adenomatous colon polyp (2004); Allergy; Diverticulosis (2011); Former smoker; HLD (hyperlipidemia); Hyperplastic colon polyp (2004); and Melanoma in situ ([place]).  FH:  Family History  Problem Relation Age of Onset  . Lung cancer Father   . Heart disease Mother     died in her 57's  . Diabetes Daughter     [facility] Social History  Substance Use Topics  . Smoking status: Former Smoker    Packs/day: 1.00    Years: 30.00    Types: Cigarettes, Cigars    Quit date: 05/13/1998  . Smokeless tobacco: Current User    Types: Chew  . Alcohol use Yes     Comment:  freq    Review of Systems Review of systems negative except for pertinent positives and negatives in history of present illness above.     Objective:     Vitals:   04/16/16 1006  BP: 119/76  Weight: 220 lb (99.8 kg)  Height: 6' (1.829 m)    Physical Exam GEN: appears well, no apparent distress. CVS: 2+ DP pulses bilaterally RESP: speaks in full sentence, no IWOB MSK: Left Ankle:   Notable swelling over in his right feet medially below medial malleolus. Pes planus right > left. No significant erythema.   Tenderness to palpation over the distal end of medial malleolus and soft tissue below. Some tenderness to palpation over cuboid/navicular areas laterally. No tenderness to palpation over proximal end of his fifth metatarsal bone. Mildly increased warmth over the medial aspect of his right foot.   Range of motion is full in all directions.  Strength is 5/5 in all directions (dorsal & plantar flexion, abduction & adduction, eversion and inversion). Light sensation intact in all dermatomes. Patellar and Achille reflex 2+ bilaterally.   Stable lateral and medial ligaments; squeeze test unremarkable  No sign of peroneal tendon subluxations or tenderness to palpation  Able to walk 4 steps. Pain with tiptoe in the right. Significant pronation in his right foot > left.   SKIN: no apparent skin lesion PSYCH: euthymic mood with congruent affect    Assessment and Plan:  Right foot pain History and exam suggestive for posterior  tibialis tendinitis. There could be some component of osteoarthritis but there is no pain with ankle joint manipulation. Exam with notable swelling over the medial aspect of his right foot, pes planus, tenderness to palpation over the same area with mildly increased warmness to touch and significant pronation in the right foot.   Will try Sterapred Dosepak 10 mg for 6 days for inflammation.   Gave shoe insert with scaphoid bites. Can pedal scaphoid if painful.    Arch strap  Follow-up in 2 weeks  Will CC his PCP  Mercy Riding, MD 04/16/16 Pager: [phone]    Patient seen and evaluated with the resident. I agree with the above plan of care. Patient is likely dealing with posterior tibialis tendinitis. He has quite a bit of swelling and discomfort along the medial foot and ankle. I would like to put him on a 6 day Sterapred Dosepak. We also gave him some green sports insoles and scaphoid pads. I did give him permission to remove the scaphoid pad if it is uncomfortable. He will also try an arch strap and will follow-up with me in 2 weeks. I did discuss the possibility of immobilization in a Cam Walker for a brief period of time if his symptoms do not improve.

## 2016-04-16 NOTE — Assessment & Plan Note (Signed)
History and exam suggestive for posterior tibialis tendinitis. There could be some component of osteoarthritis but there is no pain with ankle joint manipulation. Exam with notable swelling over the medial aspect of his right foot, pes planus, tenderness to palpation over the same area with mildly increased warmness to touch and significant pronation in the right foot.   Will try Sterapred Dosepak 10 mg for 6 days for inflammation.   Gave shoe insert with scaphoid bites. Can pedal scaphoid if painful.   Arch strap  Follow-up in 2 weeks  Will CC his PCP

## 2016-05-06 ENCOUNTER — Encounter: Payer: Self-pay | Admitting: Sports Medicine

## 2016-05-06 ENCOUNTER — Ambulatory Visit (INDEPENDENT_AMBULATORY_CARE_PROVIDER_SITE_OTHER): Payer: PPO | Admitting: Sports Medicine

## 2016-05-06 VITALS — BP 116/76 | Ht 72.0 in | Wt 220.0 lb

## 2016-05-06 DIAGNOSIS — M79672 Pain in left foot: Secondary | ICD-10-CM | POA: Diagnosis not present

## 2016-05-06 DIAGNOSIS — M76822 Posterior tibial tendinitis, left leg: Secondary | ICD-10-CM

## 2016-05-06 MED ORDER — NAPROXEN 500 MG PO TABS: 500.0000 mg | ORAL_TABLET | Freq: Two times a day (BID) | ORAL | 2 refills | Status: DC | PRN

## 2016-05-06 NOTE — Progress Notes (Signed)
   Subjective:    Patient ID: [name], male    DOB: 1950/05/28, 66 y.o.   MRN: [id number]  HPI   Patient comes in today for follow-up on left foot posterior tibialis tendinitis. His pain has improved some but he is still having quite a bit of discomfort along the medial ankle. He continues to localize it along the course of the posterior tibialis tendon. He has found the green sports insoles and scaphoid pads to be comfortable. He has also found the arch strap to be comfortable. He does state the [employer] was helpful. He has noticed less swelling.    Review of Systems As above    Objective:   Physical Exam  Well-developed, well-nourished. No acute distress. Awake alert and oriented 3. Vital signs reviewed  Left ankle: Patient is tender to palpation along the posterior tibialis tendon proximal to the insertion onto the navicular. Mild soft tissue swelling in this area. Reproducible pain with resisted plantar flexion and inversion. Full range of motion. No effusion. Significant pronation with walking.      Assessment & Plan:   Left ankle pain secondary to posterior tibialis tendinitis  Although the patient's pain has improved, it has certainly not resolved. We will make him custom orthotics today. I've also given him a Cam Walker to wear around the house when he is not at work. Hopefully this relative rest will improve his pain. Follow-up with me in 4 weeks for reevaluation. Prescription for naproxen sodium to take 500 mg twice daily with food as needed was also given. Total time spent with the patient was 30 minutes with greater than 50% of the time spent in face-to-face consultation discussing diagnosis, orthotic instruction, and orthotic fitting. Patient's gait was neutral afterwards.  Patient was fitted for a : standard, cushioned, semi-rigid orthotic. The orthotic was heated and afterward the patient stood on the orthotic blank positioned on the orthotic stand. The  patient was positioned in subtalar neutral position and 10 degrees of ankle dorsiflexion in a weight bearing stance. After completion of molding, a stable base was applied to the orthotic blank. The blank was ground to a stable position for weight bearing. Size: 12 Base: Blue EVA Posting: none Additional orthotic padding: none

## 2016-06-10 ENCOUNTER — Ambulatory Visit
Admission: RE | Admit: 2016-06-10 | Discharge: 2016-06-10 | Disposition: A | Discharge status: Home / Self Care | Payer: PPO | Source: Ambulatory Visit | Attending: Sports Medicine | Admitting: Sports Medicine

## 2016-06-10 ENCOUNTER — Ambulatory Visit (INDEPENDENT_AMBULATORY_CARE_PROVIDER_SITE_OTHER): Payer: PPO | Admitting: Sports Medicine

## 2016-06-10 ENCOUNTER — Encounter: Payer: Self-pay | Admitting: Sports Medicine

## 2016-06-10 VITALS — BP 117/68 | Ht 72.0 in | Wt 220.0 lb

## 2016-06-10 DIAGNOSIS — M25572 Pain in left ankle and joints of left foot: Secondary | ICD-10-CM

## 2016-06-10 DIAGNOSIS — M7732 Calcaneal spur, left foot: Secondary | ICD-10-CM | POA: Diagnosis not present

## 2016-06-10 NOTE — Progress Notes (Signed)
Subjective:     Patient ID: [name], male   DOB: 29-Jan-1950, 66 y.o.   MRN: [id number]  HPI Patient is a 66 year old male who presents for follow-up of posterior tibialis tendonitis on left foot. He reports that overall he is slightly improved. He wears the custom orthotics made last visit in his daily work shoes which helps reduce pain with walking and standing at work. He wears the Cam walker around the house but does not feel like that is helping his pain much. He has tried naproxen a couple times. Patient reports that he has been icing his foot daily and the swelling has gone down some. He also describes the pain as less diffuse. Patient reports he went to a funeral this past Friday and had to stand for a couple hours in his dress shoes which caused increased pain. He does not wear inserts in his dress shoes.  Review of Systems + pain with walking + swelling - radiating pain - numbness/tingling    Objective:   Physical Exam BP 117/68   Ht 6' (1.829 m)   Wt 220 lb (99.8 kg)   BMI 29.84 kg/m  General: Well appearing gentleman, resting comfortably on exam table. A&O x3. NAD. Extremities: Warm to touch. PT and DP pulses 2+ on right.  MSK: Left foot: Mild swelling present on medial side of left foot, non-pitting. No erythema. Some fungal changes of toenails including thickening and yellowing. No warmth on palpation. Some tenderness along medial side of navicular bone, with noticeable bony prominence.  ROM full in dorsiflexion, plantarflexion, inversion and eversion with pain on inversion.  Strength 5/5 on dorsiflexion and plantarflexion. No increased laxity noted on anterior drawer or talar tilt. Neuro: Full sensation intact over R lower extremity.    Assessment:     Patient is 66 year old male with left foot pain along medial side at navicular prominence consistent with posterior tibialis tendonitis at its insertion. There also may be an accessory navicular bone in this tendon  causing pain. Patient has done well with custom orthotics in his work shoes and was instructed today to wear some type of insert in his dress shoes. He has green inserts with scaphoid pad from 3/27 visit or may need new customs.    Plan:     Posterior tibialis tendonitis, left: - X ray left foot (AP, lateral, and oblique) - Order placed for PT eval and treat at [facility]. - Call patient in 2 weeks with Xray results and to follow-up on PT - Continue red custom orthotics in work shoes and instructed patient to add inserts to other shoes as needed for support with walking and standing  I personally was present and performed or re-performed the history, physical exam and medical decision-making activities of this service and have verified that the service and findings are accurately documented in the student's note.

## 2016-06-20 ENCOUNTER — Encounter: Payer: Self-pay | Admitting: Physical Therapy

## 2016-06-20 ENCOUNTER — Ambulatory Visit: Payer: PPO | Attending: Family Medicine | Admitting: Physical Therapy

## 2016-06-20 DIAGNOSIS — M6281 Muscle weakness (generalized): Secondary | ICD-10-CM | POA: Diagnosis not present

## 2016-06-20 DIAGNOSIS — R2689 Other abnormalities of gait and mobility: Secondary | ICD-10-CM | POA: Diagnosis not present

## 2016-06-20 DIAGNOSIS — M25572 Pain in left ankle and joints of left foot: Secondary | ICD-10-CM | POA: Diagnosis not present

## 2016-06-20 NOTE — Therapy (Addendum)
[place], [place], [address] Phone: [phone]   Fax:  [phone]  Physical Therapy Evaluation  Patient Details  Name: YASSIR ENIS MRN: [id number] Date of Birth: [birth year] Referring Provider: Lilia Argue DO  Encounter Date: 06/20/2016      PT End of Session - 06/20/16 1622    Visit Number 1   Number of Visits 8   Date for PT Re-Evaluation 08/15/16   PT Start Time 6720   PT Stop Time 1627   PT Time Calculation (min) 42 min   Activity Tolerance Patient tolerated treatment well   Behavior During Therapy [facility] for tasks assessed/performed      Past Medical History:  Diagnosis Date  . Adenomatous colon polyp 2004  . Allergy   . Diverticulosis 2011  . Former smoker   . HLD (hyperlipidemia)   . Hyperplastic colon polyp 2004  . Hypertension   . Melanoma in situ [facility])     Past Surgical History:  Procedure Laterality Date  . COLONOSCOPY  2011   SEVERAL     There were no vitals filed for this visit.       Subjective Assessment - 06/20/16 1551    Subjective pt is a 66 y.o M with CC of L foot pain that started 3-4 months ago with insidious onset. noticed that his foot was dropping in on the inside. saw his MD and he provided an insert which as helped still some sorness. denies N/T, pain stays in the inside of the arch.    Limitations Walking   How long can you sit comfortably? unlimited   How long can you stand comfortably? 15-20 min   How long can you walk comfortably? 15-20 min   Diagnostic tests x-ray   Patient Stated Goals ease the pain, get back to hunting with prolonged walking/ standing.    Currently in Pain? Yes   Pain Score 3   took pain meds around 7am, at worst pain gets to 7/10   Pain Location Foot   Pain Orientation Left   Pain Descriptors / Indicators Sore;Pressure   Pain Onset More than a month ago   Pain Frequency Intermittent   Aggravating Factors  prolonged walking,  standing   Pain Relieving Factors pain medication,             [facility] PT Assessment - 06/20/16 1557      Assessment   Medical Diagnosis L ankle/ foot pain   Referring Provider Lilia Argue DO   Onset Date/Surgical Date --  3 months ago   Hand Dominance Right   Next MD Visit make on after pt visit   Prior Therapy no     Precautions   Precautions None     Restrictions   Weight Bearing Restrictions No     Balance Screen   Has the patient fallen in the past 6 months No   Has the patient had a decrease in activity level because of a fear of falling?  No   Is the patient reluctant to leave their home because of a fear of falling?  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 [employer] Requirements walking/ standing, lifting, squating   Leisure hunting, fishing, watching grand kids play baseball     Cognition   Overall Cognitive Status Within Functional Limits for tasks assessed     Observation/Other Assessments   Focus on Therapeutic Outcomes (FOTO)  42% limited  predicted 32% limited     Posture/Postural Control   Posture/Postural Control Postural limitations   Postural Limitations Rounded Shoulders;Forward head     ROM / Strength   AROM / PROM / Strength AROM;Strength     AROM   AROM Assessment Site Ankle   Right/Left Ankle Left;Right   Right Ankle Dorsiflexion 8   Right Ankle Plantar Flexion 52   Right Ankle Inversion 12   Right Ankle Eversion 8   Left Ankle Dorsiflexion 4  ERP   Left Ankle Inversion 8   Left Ankle Eversion 6     Strength    Overall Strength Comments tibialis posterior 4-/5 with pain during testing   Strength Assessment Site Ankle   Right/Left Ankle Right;Left   Right Ankle Dorsiflexion 5/5   Right Ankle Plantar Flexion 5/5   Right Ankle Inversion 4+/5   Right Ankle Eversion 4+/5   Left Ankle Dorsiflexion 5/5   Left Ankle Plantar Flexion 4+/5   Left Ankle Inversion 4+/5   Left Ankle Eversion 4+/5     Palpation   Palpation comment TTP at the navicular bone, tibialis posterior and flexor hallicus longus and flexor digitorum longus     Ambulation/Gait   Ambulation/Gait Yes   Gait Pattern Step-through pattern;Trendelenburg;Antalgic  hyperpronation            Objective measurements completed on examination: See above findings.          [facility] Adult PT Treatment/Exercise - 06/20/16 1557      Ankle Exercises: Stretches   Gastroc Stretch 2 reps;30 seconds     Ankle Exercises: Seated   Towel Crunch 3 reps   Other Seated Ankle Exercises Toe yoga x 10   Other Seated Ankle Exercises posterior tib strengthening 2 x 10  with red theraband                PT Education - 06/20/16 1625    Education provided Yes   Education Details evaluation findings, POC, goals, HEP utilizing proper form/ rationale, anatomy of area involved   Person(s) Educated Patient   Methods Explanation;Verbal cues;Handout   Comprehension Verbalized understanding;Verbal cues required          PT Short Term Goals - 06/20/16 1637      PT SHORT TERM GOAL #1   Title he will be I with inital HEP (6/31/2018)   Time 4   Period Weeks   Status New     PT SHORT TERM GOAL #2   Title verbalize and demo RICE/ HEP techniques  to reduce pain and inflammation (6/31/2018)   Time 4   Period Weeks   Status New     PT SHORT TERM GOAL #3   Title increase DF to >/= 8 degrees with </= 3/10 pain for functional and efficient gait (6/31/2018)   Time 4   Period Weeks   Status New           PT Long Term Goals - 06/20/16  1639      PT LONG TERM GOAL #1   Title pt will be I  with all HEP given as of last visit (08/15/2016)   Time 8   Period Weeks   Status New     PT LONG TERM GOAL #2   Title increase posterior tib strength to >/= 4+/5 with </= 2/10 pain to promote medial arch support (08/15/2016)   Time 8   Period Weeks   Status New     PT LONG TERM GOAL #3   Title walk/ stand for >/= 30 min with </= 2/10 pain for functional endurance required for work related tasks (08/15/2016)   Time 8   Period Weeks   Status New     PT LONG TERM GOAL #4   Title Increase FOTO to </= 35% limited to demo improvement in function (08/15/2016)   Time 8   Period Weeks   Status New        G:code assessment tool: clinical judgement/ FOTO Mobility: walking and moving around current status: CJ Goal status: CI         Plan - 06/20/16 1627    Clinical Impression Statement Mr. Aaronmichael presents to [facility]  with CC of L foot/ medial arch pain. demo mild limited L ankle mobility compared bil with DF/PF. weakness with pain noted during tibialis posterior testing on the L. TTP at the navicular and at the tib posterior/ fexor hallicus/digitorum longus. he would benefit from physical therapy to decrease ankle pain, increase arch support/ ankle strength, and maxmize his function by addressing the deficits listed.    Clinical Presentation Stable   Clinical Decision Making Low   Rehab Potential Good   PT Frequency 1x / week   PT Duration 8 weeks   PT Treatment/Interventions ADLs/Self Care Home Management;Cryotherapy;Electrical Stimulation;Iontophoresis 4mg /ml Dexamethasone;Moist Heat;Ultrasound;Therapeutic activities;Therapeutic exercise;Dry needling;Taping;Manual techniques;Patient/family education;Passive range of motion;Neuromuscular re-education;Stair training   PT Next Visit Plan assess/ review HEP, arch support exericse, manual over posterior tib, modalities PRN, trial ionto if signed off, assess hip abductor strength   PT Home  Exercise Plan toe yoga, tib posterior strengthening, towel scrunches, calf stretch   Consulted and Agree with Plan of Care Patient      Patient will benefit from skilled therapeutic intervention in order to improve the following deficits and impairments:  Pain, Improper body mechanics, Postural dysfunction, Abnormal gait, Decreased endurance, Decreased activity tolerance, Decreased balance, Increased fascial restricitons, Decreased range of motion, Decreased strength  Visit Diagnosis: Pain in left ankle and joints of left foot - Plan: PT plan of care cert/re-cert  Muscle weakness (generalized) - Plan: PT plan of care cert/re-cert  Other abnormalities of gait and mobility - Plan: PT plan of care cert/re-cert     Problem List Patient Active Problem List   Diagnosis Date Noted  . Right foot pain 04/16/2016  . LLQ abdominal pain 06/10/2013  . Melanoma in situ ([place])   . Shortness of breath 05/25/2012  . Allergic rhinitis 05/25/2012  . Diverticulosis   . Former smoker   . HLD (hyperlipidemia)   . Allergy   . ABDOMINAL PAIN, LEFT LOWER QUADRANT 10/23/2009  . GERD 03/31/2007  . DIVERTICULITIS, ACUTE 03/31/2007  . SINUSITIS- ACUTE-NOS 02/18/2007  . URI 02/18/2007  . ERECTILE DYSFUNCTION 10/29/2006  . TOBACCO ABUSE 10/29/2006  . HYPERLIPIDEMIA 10/01/2006  . LABILE HYPERTENSION 10/01/2006  . DIVERTICULOSIS, COLON 10/01/2006   Starr Lake PT, DPT, LAT, ATC  06/20/16  4:56 PM      [facility] [address] [place], [place], [address] Phone: [phone]   Fax:  [phone]  Name: MICHAELA SHANKEL MRN: [id number] Date of Birth: 08/26/1950

## 2016-06-24 ENCOUNTER — Other Ambulatory Visit: Payer: Self-pay | Admitting: Family Medicine

## 2016-06-26 ENCOUNTER — Telehealth: Payer: Self-pay | Admitting: Sports Medicine

## 2016-06-26 NOTE — Telephone Encounter (Signed)
  I spoke with the patient on the phone today after reviewing the x-ray of his foot. It is fairly unremarkable. His posterior tibialis tendon pain has improved somewhat after a single physical therapy visit. I've encouraged him to continue with physical therapy for the immediate future as well as his orthotics. If his symptoms persist then we can consider further diagnostic imaging. He will follow-up for ongoing or recalcitrant issues.

## 2016-06-27 ENCOUNTER — Ambulatory Visit: Payer: PPO | Attending: Family Medicine | Admitting: Rehabilitative and Restorative Service Providers"

## 2016-06-27 DIAGNOSIS — M6281 Muscle weakness (generalized): Secondary | ICD-10-CM | POA: Diagnosis not present

## 2016-06-27 DIAGNOSIS — R2689 Other abnormalities of gait and mobility: Secondary | ICD-10-CM | POA: Diagnosis not present

## 2016-06-27 DIAGNOSIS — M25572 Pain in left ankle and joints of left foot: Secondary | ICD-10-CM | POA: Insufficient documentation

## 2016-06-27 NOTE — Therapy (Signed)
[place], [place], [address] Phone: [phone]   Fax:  [phone]  Physical Therapy Treatment  Patient Details  Name: [name] MRN: [id number] Date of Birth: [birth year] Referring Provider: Lilia Argue DO  Encounter Date: 06/27/2016      PT End of Session - 06/27/16 1733    Visit Number 2   Number of Visits 8   Date for PT Re-Evaluation 08/15/16   PT Start Time 0347   PT Stop Time 0428   PT Time Calculation (min) 41 min   Activity Tolerance Patient tolerated treatment well;No increased pain   Behavior During Therapy WFL for tasks assessed/performed      Past Medical History:  Diagnosis Date  . Adenomatous colon polyp 2004  . Allergy   . Diverticulosis 2011  . Former smoker   . HLD (hyperlipidemia)   . Hyperplastic colon polyp 2004  . Hypertension   . Melanoma in situ [facility])     Past Surgical History:  Procedure Laterality Date  . COLONOSCOPY  2011   SEVERAL     There were no vitals filed for this visit.      Subjective Assessment - 06/27/16 1729    Subjective It is about the same. These work boots don't help   Limitations Walking   How long can you sit comfortably? unlimited   How long can you stand comfortably? 20 min   How long can you walk comfortably? 15-20 min   Diagnostic tests x-ray   Patient Stated Goals ease the pain, get back to hunting with prolonged walking/ standing.    Currently in Pain? Yes   Pain Score 3    Pain Location Foot   Pain Orientation Left   Pain Descriptors / Indicators Aching;Sore   Pain Type Chronic pain   Pain Onset More than a month ago   Pain Frequency Intermittent   Aggravating Factors  standing   Pain Relieving Factors rest   Multiple Pain Sites No                         [facility] Adult PT Treatment/Exercise - 06/27/16 0001      Exercises   Exercises Ankle     Manual Therapy   Manual therapy comments STW to  plantar fascia and posterior tib with and without manual stretching     Ankle Exercises: Standing   Other Standing Ankle Exercises Gastroc/soleus stretchiing 2x30 sec each      Ankle Exercises: Seated   Other Seated Ankle Exercises NuStep LEs only level 5 with toes only x 5 min with PT verbal cues for pressing through toes to work isometric Gastroc and intrinsics; seated marble pickup x 3 min, seated ankle inversion to neutral followed by eversion to neutral x 20 each, toe crunches x 2 min     Ankle Exercises: Supine   Other Supine Ankle Exercises ankle inversion x 20, inversion with toe crunches x 20; ankle eversion x 20; ankle eversion with toe crunches x 20                  PT Short Term Goals - 06/27/16 1735      PT SHORT TERM GOAL #1   Title he will be I with inital HEP (6/31/2018)   Time 4   Period Weeks   Status On-going     PT SHORT TERM GOAL #2   Title verbalize and demo RICE/ HEP techniques  to reduce pain and inflammation (6/31/2018)   Time 4   Period Weeks   Status On-going     PT SHORT TERM GOAL #3   Title increase DF to >/= 8 degrees with </= 3/10 pain for functional and efficient gait (6/31/2018)   Time 4   Period Weeks   Status On-going           PT Long Term Goals - 06/27/16 1735      PT LONG TERM GOAL #1   Time 8   Period Weeks   Status On-going     PT LONG TERM GOAL #2   Title increase posterior tib strength to >/= 4+/5 with </= 2/10 pain to promote medial arch support (08/15/2016)   Time 8   Period Weeks   Status On-going     PT LONG TERM GOAL #3   Title walk/ stand for >/= 30 min with </= 2/10 pain for functional endurance required for work related tasks (08/15/2016)   Time 8   Period Weeks   Status On-going     PT LONG TERM GOAL #4   Title Increase FOTO to </= 35% limited to demo improvement in function (08/15/2016)   Time 8   Period Weeks   Status On-going               Plan - 06/27/16 1733    Clinical Impression  Statement Pt demonstrates tightness and pain to L foot/medial arch limiting ankle mobility. Pt with weakness especially with inversion and eversion and would benefit from further foot/ankle intrinsic and generalized exercises for pain control along with modalities and manual therapy as adjunct for pain relief    Clinical Presentation Stable   Clinical Decision Making Low   Rehab Potential Good   PT Frequency 1x / week   PT Duration 8 weeks   PT Treatment/Interventions ADLs/Self Care Home Management;Cryotherapy;Electrical Stimulation;Iontophoresis 4mg /ml Dexamethasone;Moist Heat;Ultrasound;Therapeutic activities;Therapeutic exercise;Dry needling;Taping;Manual techniques;Patient/family education;Passive range of motion;Neuromuscular re-education;Stair training   PT Next Visit Plan assess/ review HEP, arch support exericse, manual over posterior tib, modalities PRN, trial ionto if signed off, assess hip abductor strength   PT Home Exercise Plan toe yoga, tib posterior strengthening, towel scrunches, calf stretch   Consulted and Agree with Plan of Care Patient      Patient will benefit from skilled therapeutic intervention in order to improve the following deficits and impairments:  Pain, Improper body mechanics, Postural dysfunction, Abnormal gait, Decreased endurance, Decreased activity tolerance, Decreased balance, Increased fascial restricitons, Decreased range of motion, Decreased strength  Visit Diagnosis: Pain in left ankle and joints of left foot  Muscle weakness (generalized)  Other abnormalities of gait and mobility     Problem List Patient Active Problem List   Diagnosis Date Noted  . Right foot pain 04/16/2016  . LLQ abdominal pain 06/10/2013  . Melanoma in situ ([place])   . Shortness of breath 05/25/2012  . Allergic rhinitis 05/25/2012  . Diverticulosis   . Former smoker   . HLD (hyperlipidemia)   . Allergy   . ABDOMINAL PAIN, LEFT LOWER QUADRANT 10/23/2009  . GERD  03/31/2007  . DIVERTICULITIS, ACUTE 03/31/2007  . SINUSITIS- ACUTE-NOS 02/18/2007  . URI 02/18/2007  . ERECTILE DYSFUNCTION 10/29/2006  . TOBACCO ABUSE 10/29/2006  . HYPERLIPIDEMIA 10/01/2006  . LABILE HYPERTENSION 10/01/2006  . DIVERTICULOSIS, COLON 10/01/2006    Myra Rude, PT 06/27/2016, 5:36 PM  [place], [place], [address] Phone: [phone]   Fax:  [phone]  Name: [name] MRN: [id number] Date of Birth: Jan 17, 1951

## 2016-07-01 ENCOUNTER — Ambulatory Visit: Payer: PPO | Admitting: Physical Therapy

## 2016-07-01 ENCOUNTER — Encounter: Payer: Self-pay | Admitting: Physical Therapy

## 2016-07-01 DIAGNOSIS — R2689 Other abnormalities of gait and mobility: Secondary | ICD-10-CM

## 2016-07-01 DIAGNOSIS — M6281 Muscle weakness (generalized): Secondary | ICD-10-CM

## 2016-07-01 DIAGNOSIS — M25572 Pain in left ankle and joints of left foot: Secondary | ICD-10-CM | POA: Diagnosis not present

## 2016-07-01 NOTE — Therapy (Signed)
[place], [place], [address] Phone: [phone]   Fax:  [phone]  Physical Therapy Treatment  Patient Details  Name: [name] MRN: [id number] Date of Birth: Nov 28, 1950 Referring Provider: Lilia Argue DO  Encounter Date: 07/01/2016      PT End of Session - 07/01/16 1713    Visit Number 3   Number of Visits 8   Date for PT Re-Evaluation 08/15/16   PT Start Time 7741   PT Stop Time 1710   PT Time Calculation (min) 39 min   Activity Tolerance Patient tolerated treatment well   Behavior During Therapy [facility] for tasks assessed/performed      Past Medical History:  Diagnosis Date  . Adenomatous colon polyp 2004  . Allergy   . Diverticulosis 2011  . Former smoker   . HLD (hyperlipidemia)   . Hyperplastic colon polyp 2004  . Hypertension   . Melanoma in situ [facility])     Past Surgical History:  Procedure Laterality Date  . COLONOSCOPY  2011   SEVERAL     There were no vitals filed for this visit.      Subjective Assessment - 07/01/16 1631    Subjective "I feel like I am getting better"    Currently in Pain? Yes   Pain Score 1    Pain Location Foot   Pain Orientation Left   Pain Type Chronic pain   Pain Onset More than a month ago   Aggravating Factors  N/A   Pain Relieving Factors rest            [facility] PT Assessment - 07/01/16 0001      Strength   Strength Assessment Site Hip   Right Hip ABduction 3+/5   Left Hip ABduction 3+/5                     [facility] Adult PT Treatment/Exercise - 07/01/16 1635      Knee/Hip Exercises: Sidelying   Hip ABduction Strengthening;Both;1 set;15 reps   Hip ABduction Limitations cues for form   given as HEP     Manual Therapy   Manual Therapy Soft tissue mobilization;Joint mobilization;Taping   Manual therapy comments manual trigger point releaes over the L tibialis posterior   Joint Mobilization grade 5 ankle distraction  bil   Soft tissue mobilization IASTM over the tibialis posterior   Mulligan arch support taping on the LLE     Ankle Exercises: Aerobic   Stationary Bike Nu-Step L6 x 5 min  focusing on pushing through toes     Ankle Exercises: Standing   Other Standing Ankle Exercises posterior tib strengthening 2 x 15   heel raise with inversion off edge of step     Ankle Exercises: Stretches   Gastroc Stretch 2 reps;30 seconds  off edge of step     Ankle Exercises: Seated   Other Seated Ankle Exercises tibialis posterior 2 x 15  with green theraband (given to take home)                  PT Short Term Goals - 06/27/16 1735      PT SHORT TERM GOAL #1   Title he will be I with inital HEP (6/31/2018)   Time 4   Period Weeks   Status On-going     PT SHORT TERM GOAL #2   Title verbalize and demo RICE/ HEP techniques  to reduce pain and inflammation (6/31/2018)  Time 4   Period Weeks   Status On-going     PT SHORT TERM GOAL #3   Title increase DF to >/= 8 degrees with </= 3/10 pain for functional and efficient gait (6/31/2018)   Time 4   Period Weeks   Status On-going           PT Long Term Goals - 06/27/16 1735      PT LONG TERM GOAL #1   Time 8   Period Weeks   Status On-going     PT LONG TERM GOAL #2   Title increase posterior tib strength to >/= 4+/5 with </= 2/10 pain to promote medial arch support (08/15/2016)   Time 8   Period Weeks   Status On-going     PT LONG TERM GOAL #3   Title walk/ stand for >/= 30 min with </= 2/10 pain for functional endurance required for work related tasks (08/15/2016)   Time 8   Period Weeks   Status On-going     PT LONG TERM GOAL #4   Title Increase FOTO to </= 35% limited to demo improvement in function (08/15/2016)   Time 8   Period Weeks   Status On-going               Plan - 07/01/16 1713    Clinical Impression Statement Mr. Feasel reports improvement in pain at 1/10 today. continued exercise to support the  arch in standing intrinisc and extrinsic at the hip. Manual to calm down tightness of the tibialis posterior. triald arch taping to support the medial longitudinal arch.  pt reported no pain post session.    PT Treatment/Interventions ADLs/Self Care Home Management;Cryotherapy;Electrical Stimulation;Iontophoresis 4mg /ml Dexamethasone;Moist Heat;Ultrasound;Therapeutic activities;Therapeutic exercise;Dry needling;Taping;Manual techniques;Patient/family education;Passive range of motion;Neuromuscular re-education;Stair training   PT Next Visit Plan updated HEP PRN, arch support exericse, manual over posterior tib, modalities PRN, trial ionto if signed off, assess hip abductor strength   PT Home Exercise Plan toe yoga, tib posterior strengthening, towel scrunches, calf stretch, sidelying hip abduction   Consulted and Agree with Plan of Care Patient      Patient will benefit from skilled therapeutic intervention in order to improve the following deficits and impairments:  Pain, Improper body mechanics, Postural dysfunction, Abnormal gait, Decreased endurance, Decreased activity tolerance, Decreased balance, Increased fascial restricitons, Decreased range of motion, Decreased strength  Visit Diagnosis: Pain in left ankle and joints of left foot  Muscle weakness (generalized)  Other abnormalities of gait and mobility     Problem List Patient Active Problem List   Diagnosis Date Noted  . Right foot pain 04/16/2016  . LLQ abdominal pain 06/10/2013  . Melanoma in situ ([place])   . Shortness of breath 05/25/2012  . Allergic rhinitis 05/25/2012  . Diverticulosis   . Former smoker   . HLD (hyperlipidemia)   . Allergy   . ABDOMINAL PAIN, LEFT LOWER QUADRANT 10/23/2009  . GERD 03/31/2007  . DIVERTICULITIS, ACUTE 03/31/2007  . SINUSITIS- ACUTE-NOS 02/18/2007  . URI 02/18/2007  . ERECTILE DYSFUNCTION 10/29/2006  . TOBACCO ABUSE 10/29/2006  . HYPERLIPIDEMIA 10/01/2006  . LABILE HYPERTENSION  10/01/2006  . DIVERTICULOSIS, COLON 10/01/2006   Starr Lake PT, DPT, LAT, ATC  07/01/16  5:18 PM      [facility] [address] [place], [place], [address] Phone: [phone]   Fax:  [phone]  Name: ALLEX LAPOINT MRN: [id number] Date of Birth: 1950/04/23

## 2016-07-09 ENCOUNTER — Telehealth: Payer: Self-pay | Admitting: Family Medicine

## 2016-07-09 MED ORDER — CIPROFLOXACIN HCL 500 MG PO TABS: 500.0000 mg | ORAL_TABLET | Freq: Two times a day (BID) | ORAL | 0 refills | Status: DC

## 2016-07-09 MED ORDER — METRONIDAZOLE 500 MG PO TABS: 500.0000 mg | ORAL_TABLET | Freq: Two times a day (BID) | ORAL | 0 refills | Status: DC

## 2016-07-09 NOTE — Telephone Encounter (Signed)
Medication called/sent to requested pharmacy and pt aware 

## 2016-07-09 NOTE — Telephone Encounter (Signed)
cipro 500 bid and flagyl 500 bid for 10 days

## 2016-07-09 NOTE — Telephone Encounter (Signed)
Pt called [facility] stating that his diverticulosis is flaring up and would like to know if you would call something in for it?

## 2016-07-10 ENCOUNTER — Encounter: Payer: PPO | Admitting: Physical Therapy

## 2016-07-15 ENCOUNTER — Encounter: Payer: Self-pay | Admitting: Physical Therapy

## 2016-07-15 ENCOUNTER — Ambulatory Visit: Payer: PPO | Admitting: Physical Therapy

## 2016-07-15 DIAGNOSIS — M25572 Pain in left ankle and joints of left foot: Secondary | ICD-10-CM

## 2016-07-15 DIAGNOSIS — M6281 Muscle weakness (generalized): Secondary | ICD-10-CM

## 2016-07-15 DIAGNOSIS — R2689 Other abnormalities of gait and mobility: Secondary | ICD-10-CM

## 2016-07-15 NOTE — Therapy (Signed)
[place], [place], [address] Phone: [phone]   Fax:  [phone]  Physical Therapy Treatment / Discharge summary  Patient Details  Name: [name] MRN: [id number] Date of Birth: 01-31-1950 Referring Provider: Lilia Argue DO  Encounter Date: 07/15/2016      PT End of Session - 07/15/16 1637    Visit Number 4   Number of Visits 8   Date for PT Re-Evaluation 08/15/16   PT Start Time 1631   PT Stop Time 1700  short visit for D/C   PT Time Calculation (min) 29 min   Activity Tolerance Patient tolerated treatment well   Behavior During Therapy [facility] for tasks assessed/performed      Past Medical History:  Diagnosis Date  . Adenomatous colon polyp 2004  . Allergy   . Diverticulosis 2011  . Former smoker   . HLD (hyperlipidemia)   . Hyperplastic colon polyp 2004  . Hypertension   . Melanoma in situ [facility])     Past Surgical History:  Procedure Laterality Date  . COLONOSCOPY  2011   SEVERAL     There were no vitals filed for this visit.      Subjective Assessment - 07/15/16 1635    Subjective "The foot feels better"   Currently in Pain? No/denies   Pain Score 0-No pain   Pain Location Foot   Pain Orientation Left   Pain Descriptors / Indicators Aching   Pain Type Chronic pain   Pain Onset More than a month ago   Pain Frequency Intermittent   Aggravating Factors  walking barefoot,    Pain Relieving Factors soaking it in ice, resting            [facility] PT Assessment - 07/15/16 1647      Observation/Other Assessments   Focus on Therapeutic Outcomes (FOTO)  37% limited     AROM   Left Ankle Dorsiflexion 8   Left Ankle Plantar Flexion 52   Left Ankle Inversion 22   Left Ankle Eversion 12     Strength   Left Hip ABduction 4/5   Left Ankle Dorsiflexion 5/5   Left Ankle Plantar Flexion 4+/5   Left Ankle Inversion 4+/5   Left Ankle Eversion 4+/5                      [facility] Adult PT Treatment/Exercise - 07/15/16 1711      Ankle Exercises: Stretches   Gastroc Stretch 2 reps;30 seconds     Ankle Exercises: Standing   Other Standing Ankle Exercises posterior tib strengthening 2 x 15                 PT Education - 07/15/16 1714    Education provided Yes   Education Details reviewed previously provided HEP, how to progress exercises in order to promote function and further maintain improvement. beneifts of continued exercises.    Person(s) Educated Patient   Methods Explanation;Verbal cues   Comprehension Verbalized understanding;Verbal cues required          PT Short Term Goals - 07/15/16 1712      PT SHORT TERM GOAL #1   Title he will be I with inital HEP (6/31/2018)   Time 4   Period Weeks   Status Achieved     PT SHORT TERM GOAL #2   Title verbalize and demo RICE/ HEP techniques  to reduce pain and inflammation (6/31/2018)   Time 4  Period Weeks   Status Achieved     PT SHORT TERM GOAL #3   Title increase DF to >/= 8 degrees with </= 3/10 pain for functional and efficient gait (6/31/2018)   Time 4   Period Weeks   Status Achieved           PT Long Term Goals - 07/17/2016 1712      PT LONG TERM GOAL #1   Title pt will be I with all HEP given as of last visit (08/15/2016)   Time 8   Period Weeks   Status Achieved     PT LONG TERM GOAL #2   Title increase posterior tib strength to >/= 4+/5 with </= 2/10 pain to promote medial arch support (08/15/2016)   Baseline 4/5   Time 8   Period Weeks   Status Partially Met     PT LONG TERM GOAL #3   Title walk/ stand for >/= 30 min with </= 2/10 pain for functional endurance required for work related tasks (08/15/2016)   Time 8   Period Weeks   Status Achieved     PT LONG TERM GOAL #4   Title Increase FOTO to </= 35% limited to demo improvement in function (08/15/2016)   Time 8   Period Weeks   Status Partially Met                Plan - 07/17/16 1711    Clinical Impression Statement Mr. Mathey states he has been consistent with his HEP. He has made good progress with his ankle mobility and ankle/ hip strength. He met or partially met all goals today. He is able to maintain and progress his current level of function independently and will be discharged from PT today.    PT Next Visit Plan D/C   PT Home Exercise Plan toe yoga, tib posterior strengthening, towel scrunches, calf stretch, sidelying hip abduction   Consulted and Agree with Plan of Care Patient      Patient will benefit from skilled therapeutic intervention in order to improve the following deficits and impairments:  Pain, Improper body mechanics, Postural dysfunction, Abnormal gait, Decreased endurance, Decreased activity tolerance, Decreased balance, Increased fascial restricitons, Decreased range of motion, Decreased strength  Visit Diagnosis: Pain in left ankle and joints of left foot  Muscle weakness (generalized)  Other abnormalities of gait and mobility       G-Codes - 2016/07/17 1713    Functional Assessment Tool Used (Outpatient Only) Clinical judgement/ FOTO    Functional Limitation Mobility: Walking and moving around   Mobility: Walking and Moving Around Goal Status [phone]) At least 1 percent but less than 20 percent impaired, limited or restricted   Mobility: Walking and Moving Around Discharge Status [phone]) At least 1 percent but less than 20 percent impaired, limited or restricted      Problem List Patient Active Problem List   Diagnosis Date Noted  . Right foot pain 04/16/2016  . LLQ abdominal pain 06/10/2013  . Melanoma in situ ([place])   . Shortness of breath 05/25/2012  . Allergic rhinitis 05/25/2012  . Diverticulosis   . Former smoker   . HLD (hyperlipidemia)   . Allergy   . ABDOMINAL PAIN, LEFT LOWER QUADRANT 10/23/2009  . GERD 03/31/2007  . DIVERTICULITIS, ACUTE 03/31/2007  . SINUSITIS- ACUTE-NOS 02/18/2007  . URI  02/18/2007  . ERECTILE DYSFUNCTION 10/29/2006  . TOBACCO ABUSE 10/29/2006  . HYPERLIPIDEMIA 10/01/2006  . LABILE HYPERTENSION 10/01/2006  . DIVERTICULOSIS, COLON  10/01/2006   Starr Lake PT, DPT, LAT, ATC  07/15/16  5:18 PM      [facility] [address] [place], [place], [address] Phone: [phone]   Fax:  [phone]  Name: [name] MRN: [id number] Date of Birth: Jun 20, 1950     PHYSICAL THERAPY DISCHARGE SUMMARY  Visits from Start of Care: 4  Current functional level related to goals / functional outcomes: See goals, FOTO 37% limited   Remaining deficits: Soreness only when walking bare foot, per pt report.    Education / Equipment: HEP, anatomy of area involved, theraband, gait biomechanics,   Plan: Patient agrees to discharge.  Patient goals were partially met. Patient is being discharged due to being pleased with the current functional level.  ?????

## 2016-07-16 ENCOUNTER — Encounter: Payer: Self-pay | Admitting: Sports Medicine

## 2016-07-16 ENCOUNTER — Ambulatory Visit (INDEPENDENT_AMBULATORY_CARE_PROVIDER_SITE_OTHER): Payer: PPO | Admitting: Sports Medicine

## 2016-07-16 DIAGNOSIS — M25561 Pain in right knee: Secondary | ICD-10-CM

## 2016-07-16 DIAGNOSIS — M25562 Pain in left knee: Secondary | ICD-10-CM

## 2016-07-16 DIAGNOSIS — G8929 Other chronic pain: Secondary | ICD-10-CM

## 2016-07-16 MED ORDER — METHYLPREDNISOLONE ACETATE 40 MG/ML IJ SUSP
40.0000 mg | Freq: Once | INTRAMUSCULAR | Status: AC
  Administered 2016-07-16: 40 mg via INTRA_ARTICULAR

## 2016-07-18 DIAGNOSIS — M25561 Pain in right knee: Secondary | ICD-10-CM | POA: Insufficient documentation

## 2016-07-18 DIAGNOSIS — M25562 Pain in left knee: Secondary | ICD-10-CM

## 2016-07-18 NOTE — Progress Notes (Signed)
  KEDAR SEDANO - 66 y.o. male MRN [id number]  Date of birth: 05-17-1950  SUBJECTIVE:  Including CC & ROS.   Mr. Laviolette is a 66 yo M that is presenting with bilateral knee pain. He reports his pain started again about a month ago. He has done PT for his foot. He has received injections into his knees before and that seems to help his pain. His pain is posterior. Not currently taking any medications. Does feels like his pain is giving way from time to time. His last injection was in January.   ROS: No unexpected weight loss, fever, chills,  instability, muscle pain, numbness/tingling, redness, otherwise see HPI   HISTORY: Past Medical, Surgical, Social, and Family History Reviewed & Updated per EMR.   Pertinent Historical Findings include: PMHx: melanoma, HLD, HTN Surgical:   Colonoscopy  Social:  Former tobacco use  FHx:  Heart disease   DATA REVIEWED: none  PHYSICAL EXAM:  VS: BP 126/80   Ht 6' (1.829 m)   Wt 220 lb (99.8 kg)   BMI 29.84 kg/m  PHYSICAL EXAM: Gen: NAD, alert, cooperative with exam, well-appearing HEENT: clear conjunctiva, EOMI CV:  no edema, capillary refill brisk,  Resp: non-labored, normal speech Skin: no rashes, normal turgor  Neuro: no gross deficits.  Psych:  alert and oriented MSK:  Right and left knee:  No obvious effusion  Some TTP of the medial joint line  Normal QT and PT  Normal flexion and extension  Normal strength  Normal endpoints with valgus and varus stress testing  Neurovascularly intact    Aspiration/Injection Procedure Note JANIE STROTHMAN [birth year]  Procedure: Injection Indications: Right knee pain   Procedure Details Consent: Risks of procedure as well as the alternatives and risks of each were explained to the (patient/caregiver).  Consent for procedure obtained. Time Out: Verified patient identification, verified procedure, site/side was marked, verified correct patient position, special equipment/implants available,  medications/allergies/relevent history reviewed, required imaging and test results available.  Performed.  The area was cleaned with iodine and alcohol swabs.    The right knee was injected using 1 cc's of 40 mg Depomedrol and 4 cc's of 1% lidocaine with a 2151 1/2" needle.    A sterile dressing was applied.  Patient did tolerate procedure well.   Aspiration/Injection Procedure Note HALFORD GOETZKE 08-03-1950  Procedure: Injection Indications: left knee pain   Procedure Details Consent: Risks of procedure as well as the alternatives and risks of each were explained to the (patient/caregiver).  Consent for procedure obtained. Time Out: Verified patient identification, verified procedure, site/side was marked, verified correct patient position, special equipment/implants available, medications/allergies/relevent history reviewed, required imaging and test results available.  Performed.  The area was cleaned with iodine and alcohol swabs.    The left knee was injected using 1 cc's of 40 mg Depomedrol and 4 cc's of 1% lidocaine with a 25 1 1/2" needle.  A sterile dressing was applied.  Patient did tolerate procedure well.    ASSESSMENT & PLAN:   Bilateral knee pain Most likely his pain is coming from OA. He reports having a history of OA. Denies any injury.  - b/l injections today  - could consider formal PT and imaging in the future - f/u PRN

## 2016-07-18 NOTE — Assessment & Plan Note (Signed)
Most likely his pain is coming from OA. He reports having a history of OA. Denies any injury.  - b/l injections today  - could consider formal PT and imaging in the future - f/u PRN

## 2016-08-23 ENCOUNTER — Other Ambulatory Visit: Payer: Self-pay | Admitting: Family Medicine

## 2016-10-21 DIAGNOSIS — H2513 Age-related nuclear cataract, bilateral: Secondary | ICD-10-CM | POA: Diagnosis not present

## 2016-10-21 DIAGNOSIS — H25043 Posterior subcapsular polar age-related cataract, bilateral: Secondary | ICD-10-CM | POA: Diagnosis not present

## 2016-10-21 DIAGNOSIS — H25013 Cortical age-related cataract, bilateral: Secondary | ICD-10-CM | POA: Diagnosis not present

## 2016-10-21 DIAGNOSIS — I10 Essential (primary) hypertension: Secondary | ICD-10-CM | POA: Diagnosis not present

## 2016-10-21 DIAGNOSIS — H2512 Age-related nuclear cataract, left eye: Secondary | ICD-10-CM | POA: Diagnosis not present

## 2016-11-20 ENCOUNTER — Other Ambulatory Visit: Payer: Self-pay | Admitting: Family Medicine

## 2016-11-20 DIAGNOSIS — R000 Tachycardia, unspecified: Secondary | ICD-10-CM

## 2016-11-22 DIAGNOSIS — J0100 Acute maxillary sinusitis, unspecified: Secondary | ICD-10-CM | POA: Diagnosis not present

## 2016-11-26 DIAGNOSIS — H2511 Age-related nuclear cataract, right eye: Secondary | ICD-10-CM | POA: Diagnosis not present

## 2016-11-26 DIAGNOSIS — H2512 Age-related nuclear cataract, left eye: Secondary | ICD-10-CM | POA: Diagnosis not present

## 2016-11-26 DIAGNOSIS — Z961 Presence of intraocular lens: Secondary | ICD-10-CM | POA: Diagnosis not present

## 2016-12-03 DIAGNOSIS — H2511 Age-related nuclear cataract, right eye: Secondary | ICD-10-CM | POA: Diagnosis not present

## 2016-12-03 DIAGNOSIS — H25811 Combined forms of age-related cataract, right eye: Secondary | ICD-10-CM | POA: Diagnosis not present

## 2016-12-03 DIAGNOSIS — H2512 Age-related nuclear cataract, left eye: Secondary | ICD-10-CM | POA: Diagnosis not present

## 2016-12-03 DIAGNOSIS — Z961 Presence of intraocular lens: Secondary | ICD-10-CM | POA: Diagnosis not present

## 2016-12-17 ENCOUNTER — Ambulatory Visit (INDEPENDENT_AMBULATORY_CARE_PROVIDER_SITE_OTHER): Payer: PPO | Admitting: Sports Medicine

## 2016-12-17 DIAGNOSIS — M79671 Pain in right foot: Secondary | ICD-10-CM | POA: Diagnosis not present

## 2016-12-17 DIAGNOSIS — M25562 Pain in left knee: Secondary | ICD-10-CM | POA: Diagnosis not present

## 2016-12-17 DIAGNOSIS — M25561 Pain in right knee: Secondary | ICD-10-CM

## 2016-12-17 DIAGNOSIS — G8929 Other chronic pain: Secondary | ICD-10-CM

## 2016-12-17 MED ORDER — METHYLPREDNISOLONE ACETATE 40 MG/ML IJ SUSP
40.0000 mg | Freq: Once | INTRAMUSCULAR | Status: AC
  Administered 2016-12-17: 40 mg via INTRA_ARTICULAR

## 2016-12-18 ENCOUNTER — Encounter: Payer: Self-pay | Admitting: Sports Medicine

## 2016-12-18 NOTE — Progress Notes (Signed)
   Subjective:    Patient ID: [name], male    DOB: February 08, 1950, 66 y.o.   MRN: [id number]  HPI chief complaint: Bilateral knee pain  Patient comes in today requesting repeat cortisone injections into each knee. Knees were last injected 6 months ago with excellent symptom relief. Pain began to return about 6 weeks ago. It is identical in nature to what he has experienced previously. No recent trauma. No locking or catching. No feelings of instability. He's not noticed any significant swelling.   Review of Systems As above    Objective:   Physical Exam  Well-developed, well-nourished. No acute distress. Awake alert and oriented 3. Vital signs reviewed  Examination of each knee shows range of motion from 0-130. No obvious effusion. Slight tenderness along medial and lateral joint lines but a negative McMurray's. Good joint stability to valgus and varus stressing. 1+ patellofemoral crepitus. Neurovascularly intact distally.      Assessment & Plan:   Returning bilateral knee pain likely secondary to DJD  Each of the patient's knees were reinjected today with cortisone. An anterior lateral approach was utilized. Patient tolerated this without difficulty. I would like to get some updated x-rays of his knees but he would like to wait until after Christmas to do this. Future treatment will be based on those findings. I will call him with the x-ray results when available and he can follow-up with me in the office as needed.  Consent obtained and verified. Time-out conducted. Noted no overlying erythema, induration, or other signs of local infection. Skin prepped in a sterile fashion. Topical analgesic spray: Ethyl chloride. Joint: right knee Needle: 25g 1.5 inch Completed without difficulty. Meds: 3cc 1% xylocaine, 1cc (40mg ) depomedrol  Advised to call if fevers/chills, erythema, induration, drainage, or persistent bleeding.  Consent obtained and verified. Time-out  conducted. Noted no overlying erythema, induration, or other signs of local infection. Skin prepped in a sterile fashion. Topical analgesic spray: Ethyl chloride. Joint: left knee Needle: 25g 1.5 inch Completed without difficulty. Meds: 3cc 1% xylocaine, 1cc (40mg ) depomedrol  Advised to call if fevers/chills, erythema, induration, drainage, or persistent bleeding.

## 2017-02-05 ENCOUNTER — Ambulatory Visit
Admission: RE | Admit: 2017-02-05 | Discharge: 2017-02-05 | Disposition: A | Discharge status: Home / Self Care | Payer: PPO | Source: Ambulatory Visit | Attending: Sports Medicine | Admitting: Sports Medicine

## 2017-02-05 ENCOUNTER — Telehealth: Payer: Self-pay | Admitting: *Deleted

## 2017-02-05 DIAGNOSIS — M25562 Pain in left knee: Principal | ICD-10-CM

## 2017-02-05 DIAGNOSIS — M179 Osteoarthritis of knee, unspecified: Secondary | ICD-10-CM | POA: Diagnosis not present

## 2017-02-05 DIAGNOSIS — M25561 Pain in right knee: Principal | ICD-10-CM

## 2017-02-05 DIAGNOSIS — G8929 Other chronic pain: Secondary | ICD-10-CM

## 2017-02-05 NOTE — Telephone Encounter (Signed)
Order placed for bilateral knee images

## 2017-02-07 ENCOUNTER — Telehealth: Payer: Self-pay | Admitting: Sports Medicine

## 2017-02-07 NOTE — Telephone Encounter (Signed)
  I spoke with the patient on the phone today after reviewing x-rays of his knees. He has bone-on-bone medial compartmental DJD in both knees. Most recent cortisone injection only provided one day of symptom relief. He is not yet ready to consider total knee arthroplasty. I recommended that he return to the office to discuss treatment options. Specifically, I mentioned trying a medial unloader brace and referring him for Visco supplementation. Definitive treatment is a total knee arthroplasty and he understands this.

## 2017-02-11 ENCOUNTER — Ambulatory Visit: Payer: PPO | Admitting: Sports Medicine

## 2017-02-11 VITALS — BP 126/84 | Ht 72.0 in | Wt 217.0 lb

## 2017-02-11 DIAGNOSIS — M170 Bilateral primary osteoarthritis of knee: Secondary | ICD-10-CM | POA: Diagnosis not present

## 2017-02-11 MED ORDER — DICLOFENAC SODIUM 75 MG PO TBEC: DELAYED_RELEASE_TABLET | ORAL | 0 refills | Status: DC

## 2017-02-11 NOTE — Progress Notes (Signed)
  [name] comes in today at my request to discuss treatment of his bilateral knee DJD. Recent x-rays show bone-on-bone medial compartmental DJD. I explained to him that definitive treatment is a knee replacement but he is not yet ready to pursue this. We discussed the various treatment options including oral anti-inflammatories, Visco supplementation, bracing, physical therapy, and surgery. He is currently taking Tylenol for pain relief and that does help somewhat. He would like to try another medication. I've given him a prescription for Voltaren with instructions to take 75 mg twice a day when necessary with food. We also discussed a medial unloader brace but he would like to wait on that for now. He is interested in trying Visco supplementation. He understands that it may not be very beneficial given the advanced arthritis in his medial compartment but I certainly think that it is worth trying. We will schedule an appointment with Dr. Charlann Boxer for this procedure. Patient was given isometric quad exercises at a previous visit. He is doing 2 sets of 15 reps about 4 times a week. I've asked him to try to increase this to 3 sets of 30 reps 6-7 days a week. I provided him with the names of Dr. Frederik Pear and Dr. Gaynelle Arabian for future reference as surgeons that I would recommend for knee replacement. He may be a candidate for a unicompartmental replacement. At this point in time we will simply leave things open ended for the patient to follow-up with me as needed.  Total time spent with the patient was 15 minutes with greater than 50% of the time spent in face-to-face consultation discussing his diagnosis and treatment options going forward.

## 2017-02-11 NOTE — Patient Instructions (Signed)
  I'm going to refer you to Dr.Zach Tamala Julian for a "rooster comb "injection for your knees  Let me know if you are interested in trying the unloader brace  Remember to ice your knees when painful  Try either Aspercreme or capsaicin rub. You can buy these over-the-counter  Dr. Mayer Camel at [facility] and Dr. Wynelle Link at [facility] are who I would recommend for knee replacement  Let me know if I can be of any further help

## 2017-02-18 ENCOUNTER — Ambulatory Visit: Payer: PPO | Admitting: Family Medicine

## 2017-02-18 ENCOUNTER — Encounter: Payer: Self-pay | Admitting: Family Medicine

## 2017-02-18 DIAGNOSIS — M170 Bilateral primary osteoarthritis of knee: Secondary | ICD-10-CM

## 2017-02-18 NOTE — Progress Notes (Signed)
[facility] [address], [place] [address] Phone: [phone] Subjective:    I'm seeing this patient by the request  of:  Draper DO  CC: Bilateral knee pain  [id number]  [name] is a 67 y.o. male coming in with complaint of bilateral knee pain.  Bilateral knee pain.  Has seen another provider for quite some time secondary to pain in his knees.  Has been diagnosed with degenerative joint disease.  Patient has advanced near bone-on-bone medial compartment arthritis.  These were seen on x-rays that were independently visualized by me.  Patient is wanting to avoid surgical intervention.  Patient has failed such things as icing regimen, steroid injections, as well as physical therapy.  Patient is considering the possibility of Visco supplementation which has already gained approval.  Patient describes the pain as a dull, throbbing aching sensation overall.  Rates the severity of pain is 8 out of 10.  Patient is contemplating a custom brace.     Past Medical History:  Diagnosis Date  . Adenomatous colon polyp 2004  . Allergy   . Diverticulosis 2011  . Former smoker   . HLD (hyperlipidemia)   . Hyperplastic colon polyp 2004  . Hypertension   . Melanoma in situ [facility])    Past Surgical History:  Procedure Laterality Date  . COLONOSCOPY  2011   SEVERAL    Social History   Socioeconomic History  . Marital status: Married    Spouse name: None  . Number of children: 2  . Years of education: None  . Highest education level: None  Social Needs  . Financial resource strain: None  . Food insecurity - worry: None  . Food insecurity - inability: None  . Transportation needs - medical: None  . Transportation needs - non-medical: None  Occupational History  . Occupation: part Scientist, clinical (histocompatibility and immunogenetics): [employer]  Tobacco Use  . Smoking status: Former Smoker    Packs/day: 1.00    Years: 30.00    Pack years: 30.00    Types: Cigarettes, Cigars   Last attempt to quit: 05/13/1998    Years since quitting: 18.7  . Smokeless tobacco: Current User    Types: Chew  Substance and Sexual Activity  . Alcohol use: Yes    Comment: freq  . Drug use: No  . Sexual activity: None  Other Topics Concern  . None  Social History Narrative  . None   Allergies  Allergen Reactions  . Levaquin [Levofloxacin In D5w] Shortness Of Breath   Family History  Problem Relation Age of Onset  . Lung cancer Father   . Heart disease Mother        died in her 48's  . Diabetes Daughter      Past medical history, social, surgical and family history all reviewed in electronic medical record.  No pertanent information unless stated regarding to the chief complaint.   Review of Systems:Review of systems updated and as accurate as of 02/18/17  No headache, visual changes, nausea, vomiting, diarrhea, constipation, dizziness, abdominal pain, skin rash, fevers, chills, night sweats, weight loss, swollen lymph nodes, chest pain, shortness of breath, mood changes.  Positive muscle aches and joint swelling  Objective  Blood pressure 130/80, pulse 82, height 6' (1.829 m), weight 225 lb (102.1 kg), SpO2 96 %. Systems examined below as of 02/18/17   General: No apparent distress alert and oriented x3 mood and affect normal, dressed appropriately.  HEENT:  Pupils equal, extraocular movements intact  Respiratory: Patient's speak in full sentences and does not appear short of breath  Cardiovascular: No lower extremity edema, non tender, no erythema  Skin: Warm dry intact with no signs of infection or rash on extremities or on axial skeleton.  Abdomen: Soft nontender  Neuro: Cranial nerves II through XII are intact, neurovascularly intact in all extremities with 2+ DTRs and 2+ pulses.  Lymph: No lymphadenopathy of posterior or anterior cervical chain or axillae bilaterally.  Gait normal with good balance and coordination.  MSK:  Non tender with full range of motion and  good stability and symmetric strength and tone of shoulders, elbows, wrist, hip, and ankles bilaterally.  Knee: Bilateral valgus deformity noted. Large thigh to calf ratio.  Tender to palpation over medial and PF joint line.  ROM full in flexion and extension and lower leg rotation. instability with valgus force.  painful patellar compression. Patellar glide with moderate crepitus. Patellar and quadriceps tendons unremarkable. Hamstring and quadriceps strength is normal.  After informed written and verbal consent, patient was seated on exam table. Right knee was prepped with alcohol swab and utilizing anterolateral approach, patient's right knee space was injected with15 mg/2.5 mL of Orthovisc(sodium hyaluronate) in a prefilled syringe was injected easily into the knee through a 22-gauge needle..Patient tolerated the procedure well without immediate complications.  After informed written and verbal consent, patient was seated on exam table. Left knee was prepped with alcohol swab and utilizing anterolateral approach, patient's left knee space was injected with15 mg/2.5 mL of Orthovisc(sodium hyaluronate) in a prefilled syringe was injected easily into the knee through a 22-gauge needle..Patient tolerated the procedure well without immediate complications.    Impression and Recommendations:     This case required medical decision making of moderate complexity.      Note: This dictation was prepared with Dragon dictation along with smaller phrase technology. Any transcriptional errors that result from this process are unintentional.

## 2017-02-18 NOTE — Patient Instructions (Signed)
Good to see you  Ice 20 minutes 2 times daily. Usually after activity and before bed. Started the orthovisc and hope it will help  See me again next week for #2

## 2017-02-18 NOTE — Assessment & Plan Note (Signed)
Viscous supplementation started today.  Warned of potential side effects.  Discussed icing regimen, trial of topical anti-inflammatories given.  Discussed with patient to continue with conservative therapy otherwise.  Patient will follow up with me again in 1 week for second in a series of 4 injections.

## 2017-02-24 NOTE — Progress Notes (Signed)
[facility] [address], [place] [address] Phone: [phone] Subjective:      CC: Bilateral knee pain.  [id number]  [name] is a 67 y.o. male coming in with complaint of bilateral knee pain patient has found to have severe arthritis.  Failed all conservative therapy.  Patient is here for second in a series of 4 injections for Visco supplementation.  States that there is already a 10-15% improvement already.  Feels like range of motion is somewhat better.     Past Medical History:  Diagnosis Date  . Adenomatous colon polyp 2004  . Allergy   . Diverticulosis 2011  . Former smoker   . HLD (hyperlipidemia)   . Hyperplastic colon polyp 2004  . Hypertension   . Melanoma in situ [facility])    Past Surgical History:  Procedure Laterality Date  . COLONOSCOPY  2011   SEVERAL    Social History   Socioeconomic History  . Marital status: Married    Spouse name: Not on file  . Number of children: 2  . Years of education: Not on file  . Highest education level: Not on file  Social Needs  . Financial resource strain: Not on file  . Food insecurity - worry: Not on file  . Food insecurity - inability: Not on file  . Transportation needs - medical: Not on file  . Transportation needs - non-medical: Not on file  Occupational History  . Occupation: part Scientist, clinical (histocompatibility and immunogenetics): [employer]  Tobacco Use  . Smoking status: Former Smoker    Packs/day: 1.00    Years: 30.00    Pack years: 30.00    Types: Cigarettes, Cigars    Last attempt to quit: 05/13/1998    Years since quitting: 18.8  . Smokeless tobacco: Current User    Types: Chew  Substance and Sexual Activity  . Alcohol use: Yes    Comment: freq  . Drug use: No  . Sexual activity: Not on file  Other Topics Concern  . Not on file  Social History Narrative  . Not on file   Allergies  Allergen Reactions  . Levaquin [Levofloxacin In D5w] Shortness Of Breath   Family History  Problem  Relation Age of Onset  . Lung cancer Father   . Heart disease Mother        died in her 27's  . Diabetes Daughter      Past medical history, social, surgical and family history all reviewed in electronic medical record.  No pertanent information unless stated regarding to the chief complaint.   Review of Systems:Review of systems updated and as accurate as of 02/24/17  No headache, visual changes, nausea, vomiting, diarrhea, constipation, dizziness, abdominal pain, skin rash, fevers, chills, night sweats, weight loss, swollen lymph nodes, body aches, joint swelling, muscle aches, chest pain, shortness of breath, mood changes.   Objective  There were no vitals taken for this visit. Systems examined below as of 02/24/17   General: No apparent distress alert and oriented x3 mood and affect normal, dressed appropriately.  HEENT: Pupils equal, extraocular movements intact  Respiratory: Patient's speak in full sentences and does not appear short of breath  Cardiovascular: No lower extremity edema, non tender, no erythema  Skin: Warm dry intact with no signs of infection or rash on extremities or on axial skeleton.  Abdomen: Soft nontender  Neuro: Cranial nerves II through XII are intact, neurovascularly intact in all extremities with  2+ DTRs and 2+ pulses.  Lymph: No lymphadenopathy of posterior or anterior cervical chain or axillae bilaterally.  Gait normal with good balance and coordination.  MSK:  Non tender with full range of motion and good stability and symmetric strength and tone of shoulders, elbows, wrist, hip, and ankles bilaterally.  Knee: Bilateral valgus deformity noted.  Tender to palpation over medial and PF joint line.  ROM full in flexion and extension and lower leg rotation. instability with valgus force.  painful patellar compression. Patellar glide with moderate crepitus. Patellar and quadriceps tendons unremarkable. Hamstring and quadriceps strength is  normal.  After informed written and verbal consent, patient was seated on exam table. Right knee was prepped with alcohol swab and utilizing anterolateral approach, patient's right knee space was injected with15 mg/2.5 mL of Orthovisc(sodium hyaluronate) in a prefilled syringe was injected easily into the knee through a 22-gauge needle..Patient tolerated the procedure well without immediate complications.  After informed written and verbal consent, patient was seated on exam table. Left knee was prepped with alcohol swab and utilizing anterolateral approach, patient's left knee space was injected with15 mg/2.5 mL of Orthovisc(sodium hyaluronate) in a prefilled syringe was injected easily into the knee through a 22-gauge needle..Patient tolerated the procedure well without immediate complications.    Impression and Recommendations:     This case required medical decision making of moderate complexity.      Note: This dictation was prepared with Dragon dictation along with smaller phrase technology. Any transcriptional errors that result from this process are unintentional.

## 2017-02-25 ENCOUNTER — Encounter: Payer: Self-pay | Admitting: Family Medicine

## 2017-02-25 ENCOUNTER — Ambulatory Visit (INDEPENDENT_AMBULATORY_CARE_PROVIDER_SITE_OTHER): Payer: PPO | Admitting: Family Medicine

## 2017-02-25 DIAGNOSIS — M170 Bilateral primary osteoarthritis of knee: Secondary | ICD-10-CM | POA: Diagnosis not present

## 2017-02-25 NOTE — Assessment & Plan Note (Signed)
Second in a series of 4 injections given today bilaterally.  Discussed icing regimen and home exercises patient will start to increase activity slowly over the course the next several days.  Follow-up again 1 week for third in a series of 4 injections

## 2017-02-25 NOTE — Patient Instructions (Signed)
2 down and 2 to go!!!! Ice is your friend.  See me again next week

## 2017-03-03 NOTE — Progress Notes (Signed)
[facility] [address], [place] [address] Phone: [phone] Subjective:     CC: Bilateral knee pain  [id number]  [name] is a 67 y.o. male coming in with complaint of bilateral knee pain. He is here for his 3rd orthovisc injection. He has felt improvement with the injections. He states that his left knee is still worse than his right and after last injection he had some additional pain in the left knee for a couple of days. This pain has resolved.      Past Medical History:  Diagnosis Date  . Adenomatous colon polyp 2004  . Allergy   . Diverticulosis 2011  . Former smoker   . HLD (hyperlipidemia)   . Hyperplastic colon polyp 2004  . Hypertension   . Melanoma in situ [facility])    Past Surgical History:  Procedure Laterality Date  . COLONOSCOPY  2011   SEVERAL    Social History   Socioeconomic History  . Marital status: Married    Spouse name: None  . Number of children: 2  . Years of education: None  . Highest education level: None  Social Needs  . Financial resource strain: None  . Food insecurity - worry: None  . Food insecurity - inability: None  . Transportation needs - medical: None  . Transportation needs - non-medical: None  Occupational History  . Occupation: part Scientist, clinical (histocompatibility and immunogenetics): [employer]  Tobacco Use  . Smoking status: Former Smoker    Packs/day: 1.00    Years: 30.00    Pack years: 30.00    Types: Cigarettes, Cigars    Last attempt to quit: 05/13/1998    Years since quitting: 18.8  . Smokeless tobacco: Current User    Types: Chew  Substance and Sexual Activity  . Alcohol use: Yes    Comment: freq  . Drug use: No  . Sexual activity: None  Other Topics Concern  . None  Social History Narrative  . None   Allergies  Allergen Reactions  . Levaquin [Levofloxacin In D5w] Shortness Of Breath   Family History  Problem Relation Age of Onset  . Lung cancer Father   . Heart disease Mother    died in her 52's  . Diabetes Daughter      Past medical history, social, surgical and family history all reviewed in electronic medical record.  No pertanent information unless stated regarding to the chief complaint.   Review of Systems:Review of systems updated and as accurate as of 03/04/17  No headache, visual changes, nausea, vomiting, diarrhea, constipation, dizziness, abdominal pain, skin rash, fevers, chills, night sweats, weight loss, swollen lymph nodes, body aches, joint swelling, muscle aches, chest pain, shortness of breath, mood changes.   Objective  Blood pressure 132/82, pulse 70, height 6' (1.829 m), weight 223 lb (101.2 kg), SpO2 93 %. Systems examined below as of 03/04/17   General: No apparent distress alert and oriented x3 mood and affect normal, dressed appropriately.  HEENT: Pupils equal, extraocular movements intact  Respiratory: Patient's speak in full sentences and does not appear short of breath  Cardiovascular: No lower extremity edema, non tender, no erythema  Skin: Warm dry intact with no signs of infection or rash on extremities or on axial skeleton.  Abdomen: Soft nontender  Neuro: Cranial nerves II through XII are intact, neurovascularly intact in all extremities with 2+ DTRs and 2+ pulses.  Lymph: No lymphadenopathy of posterior or anterior cervical chain  or axillae bilaterally.  Gait antalgic gait MSK:  Non tender with full range of motion and good stability and symmetric strength and tone of shoulders, elbows, wrist, hipn and ankles bilaterally.    Knee: Bilateral valgus deformity noted. Large thigh to calf ratio.  Tender to palpation over medial and PF joint line.  ROM full in flexion and extension and lower leg rotation. instability with valgus force.  painful patellar compression. Patellar glide with moderate crepitus. Patellar and quadriceps tendons unremarkable. Hamstring and quadriceps strength is normal.  After informed written and verbal  consent, patient was seated on exam table. Right knee was prepped with alcohol swab and utilizing anterolateral approach, patient's right knee space was injected with15 mg/2.5 mL of Orthovisc(sodium hyaluronate) in a prefilled syringe was injected easily into the knee through a 22-gauge needle..Patient tolerated the procedure well without immediate complications.  After informed written and verbal consent, patient was seated on exam table. Left knee was prepped with alcohol swab and utilizing anterolateral approach, patient's left knee space was injected with15 mg/2.5 mL of Orthovisc(sodium hyaluronate) in a prefilled syringe was injected easily into the knee through a 22-gauge needle..Patient tolerated the procedure well without immediate complications.     Impression and Recommendations:     This case required medical decision making of moderate complexity.      Note: This dictation was prepared with Dragon dictation along with smaller phrase technology. Any transcriptional errors that result from this process are unintentional.

## 2017-03-04 ENCOUNTER — Encounter: Payer: Self-pay | Admitting: Family Medicine

## 2017-03-04 ENCOUNTER — Ambulatory Visit (INDEPENDENT_AMBULATORY_CARE_PROVIDER_SITE_OTHER): Payer: PPO | Admitting: Family Medicine

## 2017-03-04 DIAGNOSIS — M170 Bilateral primary osteoarthritis of knee: Secondary | ICD-10-CM

## 2017-03-04 NOTE — Assessment & Plan Note (Signed)
Bilateral Visco supplementation. Discussed icing regimen and home exercises.  Patient fourth and final injections bilaterally next week

## 2017-03-10 ENCOUNTER — Other Ambulatory Visit: Payer: Self-pay

## 2017-03-10 MED ORDER — DICLOFENAC SODIUM 75 MG PO TBEC: DELAYED_RELEASE_TABLET | ORAL | 0 refills | Status: DC

## 2017-03-10 NOTE — Progress Notes (Signed)
[facility] [address], [place] [address] Phone: [phone] Subjective:     CC: Knee pain follow-up  [id number]  [name] is a 67 y.o. male coming in with complaint of bilateral knee pain.  Patient is failed all conservative therapy and is here for Visco supplementation.  Patient states continues to notice significant improvement.     Past Medical History:  Diagnosis Date  . Adenomatous colon polyp 2004  . Allergy   . Diverticulosis 2011  . Former smoker   . HLD (hyperlipidemia)   . Hyperplastic colon polyp 2004  . Hypertension   . Melanoma in situ [facility])    Past Surgical History:  Procedure Laterality Date  . COLONOSCOPY  2011   SEVERAL    Social History   Socioeconomic History  . Marital status: Married    Spouse name: None  . Number of children: 2  . Years of education: None  . Highest education level: None  Social Needs  . Financial resource strain: None  . Food insecurity - worry: None  . Food insecurity - inability: None  . Transportation needs - medical: None  . Transportation needs - non-medical: None  Occupational History  . Occupation: part Scientist, clinical (histocompatibility and immunogenetics): [employer]  Tobacco Use  . Smoking status: Former Smoker    Packs/day: 1.00    Years: 30.00    Pack years: 30.00    Types: Cigarettes, Cigars    Last attempt to quit: 05/13/1998    Years since quitting: 18.8  . Smokeless tobacco: Current User    Types: Chew  Substance and Sexual Activity  . Alcohol use: Yes    Comment: freq  . Drug use: No  . Sexual activity: None  Other Topics Concern  . None  Social History Narrative  . None   Allergies  Allergen Reactions  . Levaquin [Levofloxacin In D5w] Shortness Of Breath   Family History  Problem Relation Age of Onset  . Lung cancer Father   . Heart disease Mother        died in her 80's  . Diabetes Daughter      Past medical history, social, surgical and family history all reviewed in  electronic medical record.  No pertanent information unless stated regarding to the chief complaint.   Review of Systems:Review of systems updated and as accurate as of 03/11/17  No headache, visual changes, nausea, vomiting, diarrhea, constipation, dizziness, abdominal pain, skin rash, fevers, chills, night sweats, weight loss, swollen lymph nodes, body aches, joint swelling, muscle aches, chest pain, shortness of breath, mood changes.   Objective  Blood pressure 134/82, pulse 79, height 6' (1.829 m), weight 226 lb (102.5 kg), SpO2 95 %. Systems examined below as of 03/11/17   General: No apparent distress alert and oriented x3 mood and affect normal, dressed appropriately.  HEENT: Pupils equal, extraocular movements intact  Respiratory: Patient's speak in full sentences and does not appear short of breath  Cardiovascular: No lower extremity edema, non tender, no erythema  Skin: Warm dry intact with no signs of infection or rash on extremities or on axial skeleton.  Abdomen: Soft nontender  Neuro: Cranial nerves II through XII are intact, neurovascularly intact in all extremities with 2+ DTRs and 2+ pulses.  Lymph: No lymphadenopathy of posterior or anterior cervical chain or axillae bilaterally.  Gait normal with good balance and coordination.  MSK:  Non tender with full range of motion and good stability  and symmetric strength and tone of shoulders, elbows, wrist, hip, and ankles bilaterally.  Knee: Bilateral valgus deformity noted. Large thigh to calf ratio.  Tender to palpation over medial and PF joint line.  ROM full in flexion and extension and lower leg rotation. instability with valgus force.  painful patellar compression. Patellar glide with moderate crepitus. Patellar and quadriceps tendons unremarkable. Hamstring and quadriceps strength is normal.  After informed written and verbal consent, patient was seated on exam table. Right knee was prepped with alcohol swab and  utilizing anterolateral approach, patient's right knee space was injected with15 mg/2.5 mL of Orthovisc(sodium hyaluronate) in a prefilled syringe was injected easily into the knee through a 22-gauge needle..Patient tolerated the procedure well without immediate complications.  After informed written and verbal consent, patient was seated on exam table. Left knee was prepped with alcohol swab and utilizing anterolateral approach, patient's left knee space was injected with15 mg/2.5 mL of Orthovisc(sodium hyaluronate) in a prefilled syringe was injected easily into the knee through a 22-gauge needle..Patient tolerated the procedure well without immediate complications.   Impression and Recommendations:     This case required medical decision making of moderate complexity.      Note: This dictation was prepared with Dragon dictation along with smaller phrase technology. Any transcriptional errors that result from this process are unintentional.

## 2017-03-11 ENCOUNTER — Ambulatory Visit (INDEPENDENT_AMBULATORY_CARE_PROVIDER_SITE_OTHER): Payer: PPO | Admitting: Family Medicine

## 2017-03-11 ENCOUNTER — Encounter: Payer: Self-pay | Admitting: Family Medicine

## 2017-03-11 DIAGNOSIS — M170 Bilateral primary osteoarthritis of knee: Secondary | ICD-10-CM

## 2017-03-11 NOTE — Assessment & Plan Note (Signed)
Finished Visco supplementation today.  Tolerated the procedure well.  Discussed icing regimen and home exercises.  Discussed topical anti-inflammatories.  Patient does not want any type of custom bracing at the moment.  Follow-up with me again in 4 weeks.

## 2017-03-11 NOTE — Patient Instructions (Signed)
Good to see you  [name] is your friend.  You are done See me again in 4 weeks if not great

## 2017-03-20 ENCOUNTER — Encounter: Payer: Self-pay | Admitting: Family Medicine

## 2017-03-20 ENCOUNTER — Ambulatory Visit (INDEPENDENT_AMBULATORY_CARE_PROVIDER_SITE_OTHER): Payer: PPO | Admitting: Family Medicine

## 2017-03-20 VITALS — BP 128/78 | HR 80 | Temp 98.4°F | Resp 14 | Ht 72.0 in | Wt 223.0 lb

## 2017-03-20 DIAGNOSIS — Z1159 Encounter for screening for other viral diseases: Secondary | ICD-10-CM | POA: Diagnosis not present

## 2017-03-20 DIAGNOSIS — I10 Essential (primary) hypertension: Secondary | ICD-10-CM

## 2017-03-20 DIAGNOSIS — E7800 Pure hypercholesterolemia, unspecified: Secondary | ICD-10-CM

## 2017-03-20 DIAGNOSIS — Z0000 Encounter for general adult medical examination without abnormal findings: Secondary | ICD-10-CM | POA: Diagnosis not present

## 2017-03-20 DIAGNOSIS — Z125 Encounter for screening for malignant neoplasm of prostate: Secondary | ICD-10-CM

## 2017-03-20 NOTE — Progress Notes (Signed)
Subjective:    Patient ID: [name], male    DOB: 1950/08/06, 67 y.o.   MRN: [id number]  HPI  Patient is here today for a complete physical exam.  He denies any specific concerns.  He is seeing orthopedics for his bilateral knee pain and is currently receiving Visco supplementation with benefit.  Colonoscopy was last in 2011 and is not due again until 2021.  He is due for prostate cancer screening.  He is also due for hepatitis C screening.  Immunization records are below.  He would be due for a booster on Pneumovax 23 next year.  He is due for a shingles vaccine. Immunization History  Administered Date(s) Administered  . Influenza,inj,Quad PF,6+ Mos 12/16/2012, 11/03/2014  . Influenza-Unspecified 10/21/2016  . Pneumococcal Conjugate-13 07/01/2014  . Pneumococcal Polysaccharide-23 06/19/2012  . Td 06/12/2001  . Tdap 05/18/2013   Past Medical History:  Diagnosis Date  . Adenomatous colon polyp 2004  . Allergy   . Diverticulosis 2011  . Former smoker   . HLD (hyperlipidemia)   . Hyperplastic colon polyp 2004  . Hypertension   . Melanoma in situ [facility])    Past Surgical History:  Procedure Laterality Date  . COLONOSCOPY  2011   SEVERAL    Current Outpatient Medications on File Prior to Visit  Medication Sig Dispense Refill  . cetirizine (ZYRTEC) 10 MG tablet TAKE 1 TABLET BY MOUTH DAILY 90 tablet 3  . diclofenac (VOLTAREN) 75 MG EC tablet Take 1 tablet two times daily with food as needed. 180 tablet 0  . losartan (COZAAR) 50 MG tablet TAKE 1 TABLET (50 MG TOTAL) BY MOUTH DAILY. 90 tablet 3   No current facility-administered medications on file prior to visit.    Allergies  Allergen Reactions  . Levaquin [Levofloxacin In D5w] Shortness Of Breath   Social History   Socioeconomic History  . Marital status: Married    Spouse name: Not on file  . Number of children: 2  . Years of education: Not on file  . Highest education level: Not on file  Social Needs  .  Financial resource strain: Not on file  . Food insecurity - worry: Not on file  . Food insecurity - inability: Not on file  . Transportation needs - medical: Not on file  . Transportation needs - non-medical: Not on file  Occupational History  . Occupation: part Scientist, clinical (histocompatibility and immunogenetics): [employer]  Tobacco Use  . Smoking status: Former Smoker    Packs/day: 1.00    Years: 30.00    Pack years: 30.00    Types: Cigarettes, Cigars    Last attempt to quit: 05/13/1998    Years since quitting: 18.8  . Smokeless tobacco: Current User    Types: Chew  Substance and Sexual Activity  . Alcohol use: Yes    Comment: freq  . Drug use: No  . Sexual activity: Not on file  Other Topics Concern  . Not on file  Social History Narrative  . Not on file   Family History  Problem Relation Age of Onset  . Lung cancer Father   . Heart disease Mother        died in her 62's  . Diabetes Daughter      Review of Systems  All other systems reviewed and are negative.      Objective:   Physical Exam  Constitutional: He is oriented to person, place, and time. He appears well-developed and well-nourished. No distress.  HENT:  Head: Normocephalic and atraumatic.  Right Ear: External ear normal.  Left Ear: External ear normal.  Nose: Nose normal.  Mouth/Throat: Oropharynx is clear and moist. No oropharyngeal exudate.  Eyes: Conjunctivae and EOM are normal. Pupils are equal, round, and reactive to light. Right eye exhibits no discharge. Left eye exhibits no discharge. No scleral icterus.  Neck: Normal range of motion. Neck supple. No JVD present. No tracheal deviation present. No thyromegaly present.  Cardiovascular: Normal rate, regular rhythm and normal heart sounds. Exam reveals no gallop and no friction rub.  No murmur heard. Pulmonary/Chest: Effort normal and breath sounds normal. No stridor. No respiratory distress. He has no wheezes. He has no rales. He exhibits no tenderness.  Abdominal: Soft. Bowel  sounds are normal. He exhibits no distension and no mass. There is no tenderness. There is no rebound and no guarding.  Genitourinary: Rectum normal, prostate normal and penis normal. Rectal exam shows guaiac negative stool. No penile tenderness.  Musculoskeletal: Normal range of motion. He exhibits no edema, tenderness or deformity.  Lymphadenopathy:    He has no cervical adenopathy.  Neurological: He is alert and oriented to person, place, and time. He has normal reflexes. He displays normal reflexes. No cranial nerve deficit. He exhibits normal muscle tone. Coordination normal.  Skin: Skin is warm. No rash noted. He is not diaphoretic. No erythema. No pallor.  Psychiatric: He has a normal mood and affect. His behavior is normal. Judgment and thought content normal.  Vitals reviewed.         Assessment & Plan:  Benign essential HTN - Plan: CBC with Differential/Platelet, COMPLETE METABOLIC PANEL WITH GFR, Lipid panel  Prostate cancer screening - Plan: PSA  Encounter for hepatitis C screening test for low risk patient - Plan: Hepatitis C Antibody  Pure hypercholesterolemia  General medical exam  Physical exam today is completely normal.  Recommended the shingles vaccine.  Patient will check on the price first.  Colonoscopy is up-to-date.  Prostate exam is normal but I will check a PSA.  I will also screen the patient for hepatitis C.  Blood pressure today is excellent.  I will check a CBC, CMP, fasting lipid panel to complete his regular screening.  He denies any problems with depression or falls.

## 2017-03-21 ENCOUNTER — Encounter: Payer: Self-pay | Admitting: Family Medicine

## 2017-03-21 LAB — CBC WITH DIFFERENTIAL/PLATELET
Basophils Absolute: 63 cells/uL (ref 0–200)
Basophils Relative: 0.7 %
Eosinophils Absolute: 774 cells/uL — ABNORMAL HIGH (ref 15–500)
Eosinophils Relative: 8.6 %
HCT: 43.9 % (ref 38.5–50.0)
Hemoglobin: 14.9 g/dL (ref 13.2–17.1)
Lymphs Abs: 2781 cells/uL (ref 850–3900)
MCH: 28.5 pg (ref 27.0–33.0)
MCHC: 33.9 g/dL (ref 32.0–36.0)
MCV: 84.1 fL (ref 80.0–100.0)
MPV: 10.7 fL (ref 7.5–12.5)
Monocytes Relative: 8.9 %
Neutro Abs: 4581 cells/uL (ref 1500–7800)
Neutrophils Relative %: 50.9 %
Platelets: 298 10*3/uL (ref 140–400)
RBC: 5.22 10*6/uL (ref 4.20–5.80)
RDW: 13.2 % (ref 11.0–15.0)
Total Lymphocyte: 30.9 %
WBC mixed population: 801 cells/uL (ref 200–950)
WBC: 9 10*3/uL (ref 3.8–10.8)

## 2017-03-21 LAB — COMPLETE METABOLIC PANEL WITH GFR
AG Ratio: 1.8 (calc) (ref 1.0–2.5)
ALT: 20 U/L (ref 9–46)
AST: 19 U/L (ref 10–35)
Albumin: 4.2 g/dL (ref 3.6–5.1)
Alkaline phosphatase (APISO): 77 U/L (ref 40–115)
BUN: 23 mg/dL (ref 7–25)
CO2: 23 mmol/L (ref 20–32)
Calcium: 9.4 mg/dL (ref 8.6–10.3)
Chloride: 105 mmol/L (ref 98–110)
Creat: 1.23 mg/dL (ref 0.70–1.25)
GFR, Est African American: 69 mL/min/{1.73_m2} (ref >=60)
GFR, Est Non African American: 60 mL/min/{1.73_m2} (ref >=60)
Globulin: 2.4 g/dL (calc) (ref 1.9–3.7)
Glucose, Bld: 97 mg/dL (ref 65–99)
Potassium: 5 mmol/L (ref 3.5–5.3)
Sodium: 137 mmol/L (ref 135–146)
Total Bilirubin: 0.8 mg/dL (ref 0.2–1.2)
Total Protein: 6.6 g/dL (ref 6.1–8.1)

## 2017-03-21 LAB — LIPID PANEL
Cholesterol: 195 mg/dL (ref <200)
HDL: 33 mg/dL — ABNORMAL LOW (ref >40)
LDL Cholesterol (Calc): 131 mg/dL (calc) — ABNORMAL HIGH
Non-HDL Cholesterol (Calc): 162 mg/dL (calc) — ABNORMAL HIGH (ref <130)
Total CHOL/HDL Ratio: 5.9 (calc) — ABNORMAL HIGH (ref <5.0)
Triglycerides: 173 mg/dL — ABNORMAL HIGH (ref <150)

## 2017-03-21 LAB — HEPATITIS C ANTIBODY
Hepatitis C Ab: NONREACTIVE
SIGNAL TO CUT-OFF: 0.02 (ref <1.00)

## 2017-03-21 LAB — PSA: PSA: 0.9 ng/mL (ref <=4.0)

## 2017-04-12 NOTE — Progress Notes (Signed)
[facility] [address], [place] [address] Phone: [phone] Subjective:     CC: Knee pain follow-up  PXT:[id number]  [name] is a 67 y.o. male coming in with complaint of knee pain.  Found to have severe bilateral knee arthritis.  Finished Visco supplementation March 11, 2017.  Patient states the pain is feeling significantly better.  Feels like he is having some leg pain.  Describes it as a dull throbbing aching sensation.  Seems on the medial aspect of the ankles.  Patient states that he did not notice this pain before the injections.  States over and there is nothing compared to when he was having previously.     Past Medical History:  Diagnosis Date  . Adenomatous colon polyp 2004  . Allergy   . Diverticulosis 2011  . Former smoker   . HLD (hyperlipidemia)   . Hyperplastic colon polyp 2004  . Hypertension   . Melanoma in situ [facility])    Past Surgical History:  Procedure Laterality Date  . COLONOSCOPY  2011   SEVERAL    Social History   Socioeconomic History  . Marital status: Married    Spouse name: Not on file  . Number of children: 2  . Years of education: Not on file  . Highest education level: Not on file  Occupational History  . Occupation: part Scientist, clinical (histocompatibility and immunogenetics): [employer]  Social Needs  . Financial resource strain: Not on file  . Food insecurity:    Worry: Not on file    Inability: Not on file  . Transportation needs:    Medical: Not on file    Non-medical: Not on file  Tobacco Use  . Smoking status: Former Smoker    Packs/day: 1.00    Years: 30.00    Pack years: 30.00    Types: Cigarettes, Cigars    Last attempt to quit: 05/13/1998    Years since quitting: 18.9  . Smokeless tobacco: Current User    Types: Chew  Substance and Sexual Activity  . Alcohol use: Yes    Comment: freq  . Drug use: No  . Sexual activity: Not on file  Lifestyle  . Physical activity:    Days per week: Not on file   Minutes per session: Not on file  . Stress: Not on file  Relationships  . Social connections:    Talks on phone: Not on file    Gets together: Not on file    Attends religious service: Not on file    Active member of club or organization: Not on file    Attends meetings of clubs or organizations: Not on file    Relationship status: Not on file  Other Topics Concern  . Not on file  Social History Narrative  . Not on file   Allergies  Allergen Reactions  . Levaquin [Levofloxacin In D5w] Shortness Of Breath   Family History  Problem Relation Age of Onset  . Lung cancer Father   . Heart disease Mother        died in her 32's  . Diabetes Daughter      Past medical history, social, surgical and family history all reviewed in electronic medical record.  No pertanent information unless stated regarding to the chief complaint.   Review of Systems:Review of systems updated and as accurate as of 04/14/17  No headache, visual changes, nausea, vomiting, diarrhea, constipation, dizziness, abdominal pain, skin rash, fevers, chills,  night sweats, weight loss, swollen lymph nodes, body aches, joint swelling, chest pain, shortness of breath, mood changes.  Positive muscle aches  Objective  Blood pressure 110/80, pulse 73, height 6' (1.829 m), weight 228 lb (103.4 kg), SpO2 97 %. Systems examined below as of 04/14/17   General: No apparent distress alert and oriented x3 mood and affect normal, dressed appropriately.  HEENT: Pupils equal, extraocular movements intact  Respiratory: Patient's speak in full sentences and does not appear short of breath  Cardiovascular: No lower extremity edema, non tender, no erythema  Skin: Warm dry intact with no signs of infection or rash on extremities or on axial skeleton.  Abdomen: Soft nontender  Neuro: Cranial nerves II through XII are intact, neurovascularly intact in all extremities with 2+ DTRs and 2+ pulses.  Lymph: No lymphadenopathy of posterior or  anterior cervical chain or axillae bilaterally.  Gait normal with good balance and coordination.  MSK:  Non tender with full range of motion and good stability and symmetric strength and tone of shoulders, elbows, wrist, hip, and ankles bilaterally.  The pain bilaterally does show that patient does have degenerative arthritic changes.  Abnormal thigh to calf ratio.  Minimal tenderness over the medial joint line bilaterally but nothing severe.  Full range of motion.  Still some instability noted.  Lower leg exam shows negative pain with Palpation.  Mild pain over the medial aspect of the ankle bilaterally.  No pain over the navicular bone.  More pain over the posterior tibialis tendon.    Impression and Recommendations:     This case required medical decision making of moderate complexity.      Note: This dictation was prepared with Dragon dictation along with smaller phrase technology. Any transcriptional errors that result from this process are unintentional.

## 2017-04-14 ENCOUNTER — Encounter: Payer: Self-pay | Admitting: Family Medicine

## 2017-04-14 ENCOUNTER — Ambulatory Visit (INDEPENDENT_AMBULATORY_CARE_PROVIDER_SITE_OTHER): Payer: PPO | Admitting: Family Medicine

## 2017-04-14 DIAGNOSIS — M170 Bilateral primary osteoarthritis of knee: Secondary | ICD-10-CM

## 2017-04-14 NOTE — Assessment & Plan Note (Signed)
Doing relatively well at this point.  Discussed icing regimen and home exercise.  Discussed icing regimen.  Discussed which activities of doing which wants to avoid.  Patient is welcome follow-up as needed

## 2017-04-14 NOTE — Patient Instructions (Signed)
Good to see you  Gabapentin 200mg  at night Vitamin D once week for 12 weeks.  Take the diclofenac 2 times a day for 10 days Spenco orthotics "total support" online would be great  See me again in 4-6 weeks if not better

## 2017-04-17 ENCOUNTER — Other Ambulatory Visit: Payer: Self-pay

## 2017-04-17 ENCOUNTER — Telehealth: Payer: Self-pay | Admitting: Family Medicine

## 2017-04-17 MED ORDER — VITAMIN D (ERGOCALCIFEROL) 1.25 MG (50000 UNIT) PO CAPS: 50000.0000 [IU] | ORAL_CAPSULE | ORAL | 0 refills | Status: DC

## 2017-04-17 MED ORDER — GABAPENTIN 100 MG PO CAPS: 200.0000 mg | ORAL_CAPSULE | Freq: Every day | ORAL | 3 refills | Status: DC

## 2017-04-17 NOTE — Telephone Encounter (Signed)
Copied from [place]. Topic: Quick Communication - Rx Refill/Question >> Apr 17, 2017 12:30 PM Oliver Pila B wrote: Medication: Gabapentin Has the patient contacted their pharmacy? Yes.   (Agent: If no, request that the patient contact the pharmacy for the refill.) Preferred Pharmacy (with phone number or street name): [facility] [place]: Please be advised that RX refills may take up to 3 business days. We ask that you follow-up with your pharmacy.   Pt also didn't know if vitamin D was to be prescribed or if he was taking this OTC, call pt to advise

## 2017-04-17 NOTE — Telephone Encounter (Signed)
Called pt; he was seen by Dr. Hulan Saas on Monday, 3/25.  Stated the Gabapentin did not get ordered.  Also, needs clarification on the recommended dose of Vita D.   [facility] [address].  Please contact the pt. at [phone]

## 2017-04-17 NOTE — Telephone Encounter (Signed)
Patient was fitted for bilateral ASO ankle braces. Patient ambulated well with braces and voiced understanding as to how to put them on.

## 2017-05-21 ENCOUNTER — Ambulatory Visit (INDEPENDENT_AMBULATORY_CARE_PROVIDER_SITE_OTHER)
Admission: RE | Admit: 2017-05-21 | Discharge: 2017-05-21 | Disposition: A | Discharge status: Home / Self Care | Payer: PPO | Source: Ambulatory Visit | Attending: Family Medicine | Admitting: Family Medicine

## 2017-05-21 ENCOUNTER — Ambulatory Visit (INDEPENDENT_AMBULATORY_CARE_PROVIDER_SITE_OTHER): Payer: PPO | Admitting: Family Medicine

## 2017-05-21 ENCOUNTER — Encounter: Payer: Self-pay | Admitting: Family Medicine

## 2017-05-21 ENCOUNTER — Other Ambulatory Visit: Payer: Self-pay

## 2017-05-21 VITALS — BP 130/80 | HR 75 | Ht 72.0 in | Wt 228.0 lb

## 2017-05-21 DIAGNOSIS — M170 Bilateral primary osteoarthritis of knee: Secondary | ICD-10-CM

## 2017-05-21 DIAGNOSIS — M545 Low back pain: Secondary | ICD-10-CM | POA: Diagnosis not present

## 2017-05-21 DIAGNOSIS — M549 Dorsalgia, unspecified: Secondary | ICD-10-CM | POA: Diagnosis not present

## 2017-05-21 DIAGNOSIS — G8929 Other chronic pain: Secondary | ICD-10-CM

## 2017-05-21 DIAGNOSIS — M79604 Pain in right leg: Secondary | ICD-10-CM | POA: Diagnosis not present

## 2017-05-21 DIAGNOSIS — M79605 Pain in left leg: Secondary | ICD-10-CM | POA: Insufficient documentation

## 2017-05-21 MED ORDER — DICLOFENAC SODIUM 75 MG PO TBEC: DELAYED_RELEASE_TABLET | ORAL | 0 refills | Status: DC

## 2017-05-21 MED ORDER — GABAPENTIN 100 MG PO CAPS: 200.0000 mg | ORAL_CAPSULE | Freq: Every day | ORAL | 3 refills | Status: DC

## 2017-05-21 NOTE — Patient Instructions (Signed)
Good to see you  Lets get xray of your back  Gabapentin 200mg  at night Volataren 2 times a day for 5 days and then stop.  If pain comes back then do it another 5 days See me again in 6 weeks

## 2017-05-21 NOTE — Assessment & Plan Note (Signed)
Likely radicular symptoms from the back.  Encourage gabapentin, once weekly vitamin D, refilled oral anti-inflammatories.  X-rays given and will be completed.

## 2017-05-21 NOTE — Assessment & Plan Note (Signed)
Significant improvement with patient's knees bilaterally.  Discussed icing regimen and home exercise.  No signs of any type of vascular compromise at this point.  Patient though has had history of back pain previously and does have some mild findings that could be consistent with more of a lumbar radiculopathy.  History of melanoma.  Will get back x-rays.  Patient is a former smoker and if this continues we will consider also ABIs.  Follow-up again in 4 to 6 weeks

## 2017-05-21 NOTE — Progress Notes (Signed)
[facility] [address], [place] [address] Phone: [phone] Subjective:    I'm seeing this patient by the request  of:    CC: Knee pain, new problem back pain  CZY:[id number]  [name] is a 67 y.o. male coming in with complaint of knee pain.  Patient was previously seen for Visco supplementation.  Feels that the knees bilaterally doing significantly better.  Patient is noticing not having some cramping in the legs bilaterally.  Describes the pain as a dull, throbbing aching sensation.  Seems to radiate up towards his buttocks area.  Worse with increasing activity.  Sometimes feels like he has to stop some of his activities.     Past Medical History:  Diagnosis Date  . Adenomatous colon polyp 2004  . Allergy   . Diverticulosis 2011  . Former smoker   . HLD (hyperlipidemia)   . Hyperplastic colon polyp 2004  . Hypertension   . Melanoma in situ [facility])    Past Surgical History:  Procedure Laterality Date  . COLONOSCOPY  2011   SEVERAL    Social History   Socioeconomic History  . Marital status: Married    Spouse name: Not on file  . Number of children: 2  . Years of education: Not on file  . Highest education level: Not on file  Occupational History  . Occupation: part Scientist, clinical (histocompatibility and immunogenetics): [employer]  Social Needs  . Financial resource strain: Not on file  . Food insecurity:    Worry: Not on file    Inability: Not on file  . Transportation needs:    Medical: Not on file    Non-medical: Not on file  Tobacco Use  . Smoking status: Former Smoker    Packs/day: 1.00    Years: 30.00    Pack years: 30.00    Types: Cigarettes, Cigars    Last attempt to quit: 05/13/1998    Years since quitting: 19.0  . Smokeless tobacco: Current User    Types: Chew  Substance and Sexual Activity  . Alcohol use: Yes    Comment: freq  . Drug use: No  . Sexual activity: Not on file  Lifestyle  . Physical activity:    Days per week: Not on  file    Minutes per session: Not on file  . Stress: Not on file  Relationships  . Social connections:    Talks on phone: Not on file    Gets together: Not on file    Attends religious service: Not on file    Active member of club or organization: Not on file    Attends meetings of clubs or organizations: Not on file    Relationship status: Not on file  Other Topics Concern  . Not on file  Social History Narrative  . Not on file   Allergies  Allergen Reactions  . Levaquin [Levofloxacin In D5w] Shortness Of Breath   Family History  Problem Relation Age of Onset  . Lung cancer Father   . Heart disease Mother        died in her 22's  . Diabetes Daughter      Past medical history, social, surgical and family history all reviewed in electronic medical record.  No pertanent information unless stated regarding to the chief complaint.   Review of Systems:Review of systems updated and as accurate as of 05/21/17  No headache, visual changes, nausea, vomiting, diarrhea, constipation, dizziness, abdominal pain, skin  rash, fevers, chills, night sweats, weight loss, swollen lymph nodes, body aches, joint swelling,  chest pain, shortness of breath, mood changes.  Positive muscle aches and fatigue  Objective  Blood pressure 130/80, pulse 75, height 6' (1.829 m), weight 228 lb (103.4 kg), SpO2 96 %. Systems examined below as of 05/21/17   General: No apparent distress alert and oriented x3 mood and affect normal, dressed appropriately.  HEENT: Pupils equal, extraocular movements intact  Respiratory: Patient's speak in full sentences and does not appear short of breath  Cardiovascular: No lower extremity edema, non tender, no erythema  Skin: Warm dry intact with no signs of infection or rash on extremities or on axial skeleton.  Abdomen: Soft nontender  Neuro: Cranial nerves II through XII are intact, neurovascularly intact in all extremities with 2+ DTRs and 2+ pulses.  Lymph: No  lymphadenopathy of posterior or anterior cervical chain or axillae bilaterally.  Gait normal with good balance and coordination.  MSK:  Non tender with full range of motion and good stability and symmetric strength and tone of shoulders, elbows, wrist, hip, and ankles bilaterally.  Knee exam bilaterally still shows the arthritic changes bilaterally minimal tenderness noted.  Ankles bilaterally does show some very minimal osteophytic changes.  Near full range of motion.  Mild positive Tinel sign.  Positive straight leg test bilaterally with increasing discomfort in the back as well.  .   Impression and Recommendations:     This case required medical decision making of moderate complexity.      Note: This dictation was prepared with Dragon dictation along with smaller phrase technology. Any transcriptional errors that result from this process are unintentional.

## 2017-06-27 ENCOUNTER — Encounter: Payer: Self-pay | Admitting: Family Medicine

## 2017-06-27 ENCOUNTER — Ambulatory Visit (INDEPENDENT_AMBULATORY_CARE_PROVIDER_SITE_OTHER): Payer: PPO | Admitting: Family Medicine

## 2017-06-27 VITALS — BP 130/78 | HR 66 | Temp 98.1°F | Resp 16 | Ht 72.0 in | Wt 225.0 lb

## 2017-06-27 DIAGNOSIS — L821 Other seborrheic keratosis: Secondary | ICD-10-CM

## 2017-06-27 NOTE — Progress Notes (Signed)
Subjective:    Patient ID: [name], male    DOB: 02/05/1950, 67 y.o.   MRN: [id number]  HPI Patient presents with a growing lesion on his medial left bicep.  It is 4 to 5 mm in diameter.  It is well-circumscribed brown warty papule consistent with a seborrheic keratosis.  However the patient feels that his been growing recently and is concerned about malignancy. Past Medical History:  Diagnosis Date  . Adenomatous colon polyp 2004  . Allergy   . Diverticulosis 2011  . Former smoker   . HLD (hyperlipidemia)   . Hyperplastic colon polyp 2004  . Hypertension   . Melanoma in situ [facility])    Past Surgical History:  Procedure Laterality Date  . COLONOSCOPY  2011   SEVERAL    Current Outpatient Medications on File Prior to Visit  Medication Sig Dispense Refill  . cetirizine (ZYRTEC) 10 MG tablet TAKE 1 TABLET BY MOUTH DAILY 90 tablet 3  . diclofenac (VOLTAREN) 75 MG EC tablet Take 1 tablet two times daily with food as needed. 180 tablet 0  . gabapentin (NEURONTIN) 100 MG capsule Take 2 capsules (200 mg total) by mouth at bedtime. 90 capsule 3  . losartan (COZAAR) 50 MG tablet TAKE 1 TABLET (50 MG TOTAL) BY MOUTH DAILY. 90 tablet 3  . Vitamin D, Ergocalciferol, (DRISDOL) 50000 units CAPS capsule Take 1 capsule (50,000 Units total) by mouth every 7 (seven) days. 12 capsule 0   No current facility-administered medications on file prior to visit.    Allergies  Allergen Reactions  . Levaquin [Levofloxacin In D5w] Shortness Of Breath   Social History   Socioeconomic History  . Marital status: Married    Spouse name: Not on file  . Number of children: 2  . Years of education: Not on file  . Highest education level: Not on file  Occupational History  . Occupation: part Scientist, clinical (histocompatibility and immunogenetics): [employer]  Social Needs  . Financial resource strain: Not on file  . Food insecurity:    Worry: Not on file    Inability: Not on file  . Transportation needs:    Medical: Not on file   Non-medical: Not on file  Tobacco Use  . Smoking status: Former Smoker    Packs/day: 1.00    Years: 30.00    Pack years: 30.00    Types: Cigarettes, Cigars    Last attempt to quit: 05/13/1998    Years since quitting: 19.1  . Smokeless tobacco: Current User    Types: Chew  Substance and Sexual Activity  . Alcohol use: Yes    Comment: freq  . Drug use: No  . Sexual activity: Not on file  Lifestyle  . Physical activity:    Days per week: Not on file    Minutes per session: Not on file  . Stress: Not on file  Relationships  . Social connections:    Talks on phone: Not on file    Gets together: Not on file    Attends religious service: Not on file    Active member of club or organization: Not on file    Attends meetings of clubs or organizations: Not on file    Relationship status: Not on file  . Intimate partner violence:    Fear of current or ex partner: Not on file    Emotionally abused: Not on file    Physically abused: Not on file    Forced sexual activity: Not  on file  Other Topics Concern  . Not on file  Social History Narrative  . Not on file      Review of Systems  All other systems reviewed and are negative.      Objective:   Physical Exam  Constitutional: He appears well-developed and well-nourished.  Cardiovascular: Normal rate and regular rhythm.  Pulmonary/Chest: Effort normal and breath sounds normal.  Musculoskeletal:       Arms: Vitals reviewed.         Assessment & Plan:  Seborrheic keratosis  Lesion appears to be a seborrheic keratosis.  It is a well-circumscribed brown warty papule.  There are no concerning features.  I went ahead and treated the patient with liquid nitrogen cryotherapy for 30 seconds to try to destroy the lesion per his request.  If lesion persist or changes, we could perform an excisional biopsy but I try to reassure the patient I feel the lesion is benign.

## 2017-07-01 NOTE — Progress Notes (Signed)
[facility] [address] [place], [place] [address] Phone: [phone] Subjective:     CC: Back pain  CHY:[id number]  [name] is a 67 y.o. male coming in with complaint of back pain. His pain has not change and states that it is the same as last visit.  Patient states that overall seems to be doing really well.  Gabapentin has been helping though.  Patient states that helps with the nighttime pain.  Back pain never stops him with activities.  Patient had pain in left knee last week but the pain went away. States that it felt like he was struck by baseball bat. Pain has subsided within 2 days. No pain today.  Patient states that overall though still doing significantly better since the Visco supplementation.     Past Medical History:  Diagnosis Date  . Adenomatous colon polyp 2004  . Allergy   . Diverticulosis 2011  . Former smoker   . HLD (hyperlipidemia)   . Hyperplastic colon polyp 2004  . Hypertension   . Melanoma in situ [facility])    Past Surgical History:  Procedure Laterality Date  . COLONOSCOPY  2011   SEVERAL    Social History   Socioeconomic History  . Marital status: Married    Spouse name: Not on file  . Number of children: 2  . Years of education: Not on file  . Highest education level: Not on file  Occupational History  . Occupation: part Scientist, clinical (histocompatibility and immunogenetics): [employer]  Social Needs  . Financial resource strain: Not on file  . Food insecurity:    Worry: Not on file    Inability: Not on file  . Transportation needs:    Medical: Not on file    Non-medical: Not on file  Tobacco Use  . Smoking status: Former Smoker    Packs/day: 1.00    Years: 30.00    Pack years: 30.00    Types: Cigarettes, Cigars    Last attempt to quit: 05/13/1998    Years since quitting: 19.1  . Smokeless tobacco: Current User    Types: Chew  Substance and Sexual Activity  . Alcohol use: Yes    Comment: freq  . Drug use: No  . Sexual activity:  Not on file  Lifestyle  . Physical activity:    Days per week: Not on file    Minutes per session: Not on file  . Stress: Not on file  Relationships  . Social connections:    Talks on phone: Not on file    Gets together: Not on file    Attends religious service: Not on file    Active member of club or organization: Not on file    Attends meetings of clubs or organizations: Not on file    Relationship status: Not on file  Other Topics Concern  . Not on file  Social History Narrative  . Not on file   Allergies  Allergen Reactions  . Levaquin [Levofloxacin In D5w] Shortness Of Breath   Family History  Problem Relation Age of Onset  . Lung cancer Father   . Heart disease Mother        died in her 42's  . Diabetes Daughter      Past medical history, social, surgical and family history all reviewed in electronic medical record.  No pertanent information unless stated regarding to the chief complaint.   Review of Systems:Review of systems updated and as  accurate as of 07/02/17  No headache, visual changes, nausea, vomiting, diarrhea, constipation, dizziness, abdominal pain, skin rash, fevers, chills, night sweats, weight loss, swollen lymph nodes, body aches, joint swelling,  chest pain, shortness of breath, mood changes.  Positive muscle aches  Objective  Blood pressure 132/82, pulse 77, height 6' (1.829 m), weight 226 lb (102.5 kg), SpO2 96 %. Systems examined below as of 07/02/17   General: No apparent distress alert and oriented x3 mood and affect normal, dressed appropriately.  HEENT: Pupils equal, extraocular movements intact  Respiratory: Patient's speak in full sentences and does not appear short of breath  Cardiovascular: No lower extremity edema, non tender, no erythema  Skin: Warm dry intact with no signs of infection or rash on extremities or on axial skeleton.  Abdomen: Soft nontender  Neuro: Cranial nerves II through XII are intact, neurovascularly intact in all  extremities with 2+ DTRs and 2+ pulses.  Lymph: No lymphadenopathy of posterior or anterior cervical chain or axillae bilaterally.  Gait normal with good balance and coordination.  MSK:  Non tender with full range of motion and good stability and symmetric strength and tone of shoulders, elbows, wrist, hip, and ankles bilaterally.  Mild arthritic changes of multiple joints  Bilateral knee exam shows some minimal swelling.  Near full range of motion.  Crepitus noted.  Instability noted with valgus force.  Varus deformity of the legs bilaterally with abnormal thigh to calf ratio.   Neck exam shows loss of lordosis.  No scoliosis noted.  Does have some tightness in all planes lacking last 5 degrees.  Lacks last 10 degrees of extension.  Tightness with Corky Sox test bilaterally.  Tightness of the hamstrings bilaterally but not severe.  Neurovascularly intact distally.  Full strength      Impression and Recommendations:     This case required medical decision making of moderate complexity.      Note: This dictation was prepared with Dragon dictation along with smaller phrase technology. Any transcriptional errors that result from this process are unintentional.

## 2017-07-02 ENCOUNTER — Encounter: Payer: Self-pay | Admitting: Family Medicine

## 2017-07-02 ENCOUNTER — Other Ambulatory Visit: Payer: Self-pay | Admitting: Family Medicine

## 2017-07-02 ENCOUNTER — Ambulatory Visit (INDEPENDENT_AMBULATORY_CARE_PROVIDER_SITE_OTHER): Payer: PPO | Admitting: Family Medicine

## 2017-07-02 DIAGNOSIS — M170 Bilateral primary osteoarthritis of knee: Secondary | ICD-10-CM | POA: Diagnosis not present

## 2017-07-02 NOTE — Patient Instructions (Signed)
Good to see you  I am proud of you  Keep it up  Gabapentin 1-3 pills nightly DHEA 50 mg daily for 4 weeks then 1 week off then repeat if needed Keep working on the weight  See me when you need me  [phone]

## 2017-07-02 NOTE — Telephone Encounter (Signed)
Refill done.  

## 2017-07-02 NOTE — Assessment & Plan Note (Signed)
Overall doing well.  Patient is now 4 months out from the [place] and doing well he can follow-up as needed

## 2017-08-12 ENCOUNTER — Other Ambulatory Visit: Payer: Self-pay | Admitting: Family Medicine

## 2017-09-25 ENCOUNTER — Other Ambulatory Visit: Payer: Self-pay | Admitting: Family Medicine

## 2017-10-10 ENCOUNTER — Other Ambulatory Visit: Payer: Self-pay | Admitting: Family Medicine

## 2017-11-19 ENCOUNTER — Other Ambulatory Visit: Payer: Self-pay | Admitting: Family Medicine

## 2017-11-27 ENCOUNTER — Ambulatory Visit (INDEPENDENT_AMBULATORY_CARE_PROVIDER_SITE_OTHER): Payer: PPO | Admitting: Family Medicine

## 2017-11-27 ENCOUNTER — Encounter: Payer: Self-pay | Admitting: Family Medicine

## 2017-11-27 VITALS — BP 110/74 | HR 84 | Temp 98.2°F | Resp 18 | Ht 72.0 in | Wt 219.0 lb

## 2017-11-27 DIAGNOSIS — E785 Hyperlipidemia, unspecified: Secondary | ICD-10-CM

## 2017-11-27 DIAGNOSIS — I10 Essential (primary) hypertension: Secondary | ICD-10-CM

## 2017-11-27 DIAGNOSIS — L821 Other seborrheic keratosis: Secondary | ICD-10-CM

## 2017-11-27 MED ORDER — ROSUVASTATIN CALCIUM 20 MG PO TABS: 20.0000 mg | ORAL_TABLET | Freq: Every day | ORAL | 3 refills | Status: DC

## 2017-11-27 NOTE — Progress Notes (Signed)
Subjective:    Patient ID: [name], male    DOB: 09/03/1950, 67 y.o.   MRN: [id number]  HPI  Patient is here today to have me look at a mole in the center of his back.  Mole was roughly 5 mm in diameter.  It is well-circumscribed wartlike brown papule.  There are no concerning features.  It appears to be a seborrheic keratosis.  It is not bleeding.  It does not appear to require any treatment.  However, I reviewed the patient's last fasting lipid panel.  Based on his blood pressure today, his age, his history of hypertension, his low HDL, his calculated 10-year risk of cardiovascular disease is approximately 17%.  Therefore it is recommended that he take high-dose statin therapy.  I spent approximately 15 minutes with the patient today explaining this and discussing his options Past Medical History:  Diagnosis Date  . Adenomatous colon polyp 2004  . Allergy   . Diverticulosis 2011  . Former smoker   . HLD (hyperlipidemia)   . Hyperplastic colon polyp 2004  . Hypertension   . Melanoma in situ [facility])    Past Surgical History:  Procedure Laterality Date  . COLONOSCOPY  2011   SEVERAL    Current Outpatient Medications on File Prior to Visit  Medication Sig Dispense Refill  . cetirizine (ZYRTEC) 10 MG tablet TAKE 1 TABLET BY MOUTH EVERY DAY 90 tablet 3  . diclofenac (VOLTAREN) 75 MG EC tablet TAKE 1 TABLET BY MOUTH TWICE A DAY WITH FOOD AS NEEDED 180 tablet 0  . gabapentin (NEURONTIN) 100 MG capsule Take 2 capsules (200 mg total) by mouth at bedtime. 90 capsule 3  . losartan (COZAAR) 50 MG tablet TAKE 1 TABLET (50 MG TOTAL) BY MOUTH DAILY. 90 tablet 3  . Vitamin D, Ergocalciferol, (DRISDOL) 50000 units CAPS capsule TAKE 1 CAPSULE (50,000 UNITS TOTAL) BY MOUTH EVERY 7 (SEVEN) DAYS. 12 capsule 0   No current facility-administered medications on file prior to visit.    Allergies  Allergen Reactions  . Levaquin [Levofloxacin In D5w] Shortness Of Breath   Social History    Socioeconomic History  . Marital status: Married    Spouse name: Not on file  . Number of children: 2  . Years of education: Not on file  . Highest education level: Not on file  Occupational History  . Occupation: part Scientist, clinical (histocompatibility and immunogenetics): [employer]  Social Needs  . Financial resource strain: Not on file  . Food insecurity:    Worry: Not on file    Inability: Not on file  . Transportation needs:    Medical: Not on file    Non-medical: Not on file  Tobacco Use  . Smoking status: Former Smoker    Packs/day: 1.00    Years: 30.00    Pack years: 30.00    Types: Cigarettes, Cigars    Last attempt to quit: 05/13/1998    Years since quitting: 19.5  . Smokeless tobacco: Current User    Types: Chew  Substance and Sexual Activity  . Alcohol use: Yes    Comment: freq  . Drug use: No  . Sexual activity: Not on file  Lifestyle  . Physical activity:    Days per week: Not on file    Minutes per session: Not on file  . Stress: Not on file  Relationships  . Social connections:    Talks on phone: Not on file    Gets together: Not on  file    Attends religious service: Not on file    Active member of club or organization: Not on file    Attends meetings of clubs or organizations: Not on file    Relationship status: Not on file  . Intimate partner violence:    Fear of current or ex partner: Not on file    Emotionally abused: Not on file    Physically abused: Not on file    Forced sexual activity: Not on file  Other Topics Concern  . Not on file  Social History Narrative  . Not on file    Review of Systems  All other systems reviewed and are negative.      Objective:   Physical Exam  Cardiovascular: Normal rate, regular rhythm and normal heart sounds.  Pulmonary/Chest: Effort normal and breath sounds normal.  Musculoskeletal:       Back:  Vitals reviewed.         Assessment & Plan:  Seborrheic keratosis  Benign essential HTN  Dyslipidemia  I offered the  patient a shave biopsy however he is comfortable with reassurance.  Therefore we will just monitor the area for now.  Based on his calculated 10-year risk of cardiovascular disease, the patient elects to take Crestor 20 mg a day and recheck his cholesterol in 3 months

## 2018-02-21 ENCOUNTER — Other Ambulatory Visit: Payer: Self-pay | Admitting: Family Medicine

## 2018-03-24 ENCOUNTER — Ambulatory Visit (INDEPENDENT_AMBULATORY_CARE_PROVIDER_SITE_OTHER): Payer: PPO | Admitting: Family Medicine

## 2018-03-24 ENCOUNTER — Encounter: Payer: Self-pay | Admitting: Family Medicine

## 2018-03-24 VITALS — BP 128/80 | HR 80 | Temp 97.8°F | Resp 18 | Ht 72.0 in | Wt 224.0 lb

## 2018-03-24 DIAGNOSIS — J019 Acute sinusitis, unspecified: Secondary | ICD-10-CM | POA: Diagnosis not present

## 2018-03-24 DIAGNOSIS — B9689 Other specified bacterial agents as the cause of diseases classified elsewhere: Secondary | ICD-10-CM | POA: Diagnosis not present

## 2018-03-24 MED ORDER — AMOXICILLIN 875 MG PO TABS: 875.0000 mg | ORAL_TABLET | Freq: Two times a day (BID) | ORAL | 0 refills | Status: DC

## 2018-03-24 MED ORDER — FLUTICASONE PROPIONATE 50 MCG/ACT NA SUSP: 2.0000 | Freq: Every day | NASAL | 6 refills | Status: DC

## 2018-03-24 NOTE — Progress Notes (Signed)
Subjective:    Patient ID: [name], male    DOB: Mar 30, 1950, 68 y.o.   MRN: [id number]  HPI Patient reports pain and pressure in his frontal and ethmoid sinus areas for 2-1/2 to 3 weeks.  He reports postnasal drip and constant drainage.  He reports a dull headache in that area.  He denies any sore throat other than irritation from drainage.  He denies any severe cough other than irritation and tickling from drainage.  He denies any chest pain shortness of breath myalgias nausea vomiting or diarrhea.  He denies any otalgia.  However he is extremely stopped up and unable to breathe through his nose.  He is tried Benadryl and Sudafed with no relief Past Medical History:  Diagnosis Date  . Adenomatous colon polyp 2004  . Allergy   . Diverticulosis 2011  . Former smoker   . HLD (hyperlipidemia)   . Hyperplastic colon polyp 2004  . Hypertension   . Melanoma in situ [facility])    Past Surgical History:  Procedure Laterality Date  . COLONOSCOPY  2011   SEVERAL    Current Outpatient Medications on File Prior to Visit  Medication Sig Dispense Refill  . cetirizine (ZYRTEC) 10 MG tablet TAKE 1 TABLET BY MOUTH EVERY DAY 90 tablet 3  . diclofenac (VOLTAREN) 75 MG EC tablet TAKE 1 TABLET BY MOUTH TWICE A DAY WITH FOOD AS NEEDED 180 tablet 0  . gabapentin (NEURONTIN) 100 MG capsule Take 2 capsules (200 mg total) by mouth at bedtime. 90 capsule 3  . losartan (COZAAR) 50 MG tablet TAKE 1 TABLET (50 MG TOTAL) BY MOUTH DAILY. 90 tablet 3  . rosuvastatin (CRESTOR) 20 MG tablet Take 1 tablet (20 mg total) by mouth daily. 90 tablet 3  . Vitamin D, Ergocalciferol, (DRISDOL) 50000 units CAPS capsule TAKE 1 CAPSULE (50,000 UNITS TOTAL) BY MOUTH EVERY 7 (SEVEN) DAYS. 12 capsule 0   No current facility-administered medications on file prior to visit.    Allergies  Allergen Reactions  . Levaquin [Levofloxacin In D5w] Shortness Of Breath   Social History   Socioeconomic History  . Marital status:  Married    Spouse name: Not on file  . Number of children: 2  . Years of education: Not on file  . Highest education level: Not on file  Occupational History  . Occupation: part Scientist, clinical (histocompatibility and immunogenetics): [employer]  Social Needs  . Financial resource strain: Not on file  . Food insecurity:    Worry: Not on file    Inability: Not on file  . Transportation needs:    Medical: Not on file    Non-medical: Not on file  Tobacco Use  . Smoking status: Former Smoker    Packs/day: 1.00    Years: 30.00    Pack years: 30.00    Types: Cigarettes, Cigars    Last attempt to quit: 05/13/1998    Years since quitting: 19.8  . Smokeless tobacco: Current User    Types: Chew  Substance and Sexual Activity  . Alcohol use: Yes    Comment: freq  . Drug use: No  . Sexual activity: Not on file  Lifestyle  . Physical activity:    Days per week: Not on file    Minutes per session: Not on file  . Stress: Not on file  Relationships  . Social connections:    Talks on phone: Not on file    Gets together: Not on file  Attends religious service: Not on file    Active member of club or organization: Not on file    Attends meetings of clubs or organizations: Not on file    Relationship status: Not on file  . Intimate partner violence:    Fear of current or ex partner: Not on file    Emotionally abused: Not on file    Physically abused: Not on file    Forced sexual activity: Not on file  Other Topics Concern  . Not on file  Social History Narrative  . Not on file      Review of Systems  All other systems reviewed and are negative.      Objective:   Physical Exam  Constitutional: He appears well-developed and well-nourished. No distress.  HENT:  Right Ear: Tympanic membrane, external ear and ear canal normal.  Left Ear: Tympanic membrane, external ear and ear canal normal.  Nose: Mucosal edema and rhinorrhea present. Right sinus exhibits frontal sinus tenderness. Right sinus exhibits no  maxillary sinus tenderness. Left sinus exhibits frontal sinus tenderness. Left sinus exhibits no maxillary sinus tenderness.  Mouth/Throat: Oropharynx is clear and moist. No oropharyngeal exudate.  Eyes: Pupils are equal, round, and reactive to light. Conjunctivae are normal.  Neck: Neck supple.  Cardiovascular: Normal rate, regular rhythm and normal heart sounds.  Pulmonary/Chest: Effort normal and breath sounds normal. No respiratory distress. He has no wheezes. He has no rales.  Lymphadenopathy:    He has no cervical adenopathy.  Skin: He is not diaphoretic.  Vitals reviewed.         Assessment & Plan:  Acute bacterial rhinosinusitis  I believe the patient has a sinus infection.  Begin Afrin 2 sprays each nostril daily for 3 days.  After he uses the Afrin, he then will use Flonase 2 sprays each nostril daily as well.  This will get down the swelling in the nostrils to allow the Flonase to penetrate deeper into the sinus area.  Use nasal saline 4 times daily to help flush the sinuses.  Add amoxicillin 875 mg p.o. twice daily for 10 days.  Recheck in 2 weeks if not better or sooner if worse

## 2018-05-02 ENCOUNTER — Other Ambulatory Visit: Payer: Self-pay | Admitting: Family Medicine

## 2018-05-04 NOTE — Telephone Encounter (Signed)
Spoke with patient who will do a virtual visit with Dr. Tamala Julian tomorrow, 05/04/2018.

## 2018-05-05 ENCOUNTER — Ambulatory Visit (INDEPENDENT_AMBULATORY_CARE_PROVIDER_SITE_OTHER): Payer: PPO | Admitting: Family Medicine

## 2018-05-05 ENCOUNTER — Encounter: Payer: Self-pay | Admitting: Family Medicine

## 2018-05-05 DIAGNOSIS — M170 Bilateral primary osteoarthritis of knee: Secondary | ICD-10-CM | POA: Diagnosis not present

## 2018-05-05 MED ORDER — MELOXICAM 15 MG PO TABS: 15.0000 mg | ORAL_TABLET | Freq: Every day | ORAL | 0 refills | Status: DC

## 2018-05-05 NOTE — Progress Notes (Signed)
[facility] [address], [place] [address] Phone: [phone] Subjective:   Virtual Visit via Video Note  I connected with [name] on 05/05/18 at  8:30 AM EDT by a video enabled telemedicine application and verified that I am speaking with the correct person using two identifiers.   I discussed the limitations of evaluation and management by telemedicine and the availability of in person appointments. The patient expressed understanding and agreed to proceed.  Patient is in his work office.  I am in my office.     I discussed the assessment and treatment plan with the patient. The patient was provided an opportunity to ask questions and all were answered. The patient agreed with the plan and demonstrated an understanding of the instructions.   The patient was advised to call back or seek an in-person evaluation if the symptoms worsen or if the condition fails to improve as anticipated.  I provided *16minutes of face-to-face time during this encounter.   Lyndal Pulley, DO  More detailed note below   CC: Bilateral knee pain  [id number]  [name] is a 68 y.o. male coming in with complaint of bilateral knee pain.  Had been seen previously.  Patient responded well to Visco supplementation.  This was in February 2019.  Last 29-month started having increasing discomfort and pain again.  Noticing some swelling.  Symptoms feels like increasing instability. No new injuries.  Patient is looking for some relief.  Would consider the possibility of viscosupplementation again.       Past Medical History:  Diagnosis Date  . Adenomatous colon polyp 2004  . Allergy   . Diverticulosis 2011  . Former smoker   . HLD (hyperlipidemia)   . Hyperplastic colon polyp 2004  . Hypertension   . Melanoma in situ [facility])    Past Surgical History:  Procedure Laterality Date  . COLONOSCOPY  2011   SEVERAL    Social History   Socioeconomic History   . Marital status: Married    Spouse name: Not on file  . Number of children: 2  . Years of education: Not on file  . Highest education level: Not on file  Occupational History  . Occupation: part Scientist, clinical (histocompatibility and immunogenetics): [employer]  Social Needs  . Financial resource strain: Not on file  . Food insecurity:    Worry: Not on file    Inability: Not on file  . Transportation needs:    Medical: Not on file    Non-medical: Not on file  Tobacco Use  . Smoking status: Former Smoker    Packs/day: 1.00    Years: 30.00    Pack years: 30.00    Types: Cigarettes, Cigars    Last attempt to quit: 05/13/1998    Years since quitting: 19.9  . Smokeless tobacco: Current User    Types: Chew  Substance and Sexual Activity  . Alcohol use: Yes    Comment: freq  . Drug use: No  . Sexual activity: Not on file  Lifestyle  . Physical activity:    Days per week: Not on file    Minutes per session: Not on file  . Stress: Not on file  Relationships  . Social connections:    Talks on phone: Not on file    Gets together: Not on file    Attends religious service: Not on file    Active member of club or organization: Not on file  Attends meetings of clubs or organizations: Not on file    Relationship status: Not on file  Other Topics Concern  . Not on file  Social History Narrative  . Not on file   Allergies  Allergen Reactions  . Levaquin [Levofloxacin In D5w] Shortness Of Breath  . Crestor [Rosuvastatin Calcium] Diarrhea   Family History  Problem Relation Age of Onset  . Lung cancer Father   . Heart disease Mother        died in her 70's  . Diabetes Daughter      Current Outpatient Medications (Cardiovascular):  .  losartan (COZAAR) 50 MG tablet, TAKE 1 TABLET (50 MG TOTAL) BY MOUTH DAILY.  Current Outpatient Medications (Respiratory):  .  cetirizine (ZYRTEC) 10 MG tablet, TAKE 1 TABLET BY MOUTH EVERY DAY .  fluticasone (FLONASE) 50 MCG/ACT nasal spray, Place 2 sprays into both nostrils  daily.  Current Outpatient Medications (Analgesics):  .  diclofenac (VOLTAREN) 75 MG EC tablet, TAKE 1 TABLET BY MOUTH TWICE A DAY WITH FOOD AS NEEDED   Current Outpatient Medications (Other):  .  amoxicillin (AMOXIL) 875 MG tablet, Take 1 tablet (875 mg total) by mouth 2 (two) times daily. Marland Kitchen  gabapentin (NEURONTIN) 100 MG capsule, Take 2 capsules (200 mg total) by mouth at bedtime. .  Vitamin D, Ergocalciferol, (DRISDOL) 50000 units CAPS capsule, TAKE 1 CAPSULE (50,000 UNITS TOTAL) BY MOUTH EVERY 7 (SEVEN) DAYS.    Past medical history, social, surgical and family history all reviewed in electronic medical record.  No pertanent information unless stated regarding to the chief complaint.   Review of Systems:  No headache, visual changes, nausea, vomiting, diarrhea, constipation, dizziness, abdominal pain, skin rash, fevers, chills, night sweats, weight loss, swollen lymph nodes,, chest pain, shortness of breath, mood changes.  Positive muscle aches, joint swelling, body aches  Objective    General: No apparent distress alert and oriented x3 mood and affect normal, dressed appropriately.      Impression and Recommendations:     This case required medical decision making of moderate complexity. The above documentation has been reviewed and is accurate and complete Lyndal Pulley, DO       Note: This dictation was prepared with Dragon dictation along with smaller phrase technology. Any transcriptional errors that result from this process are unintentional.

## 2018-05-05 NOTE — Assessment & Plan Note (Signed)
.    Try to get prior encouraged him to do icing, over-the-counter medications, meloxicam given for breakthrough pain.  Encouraged him to discontinue the Voltaren if he is taking it.  Plan at the moment.  Be following up with [place] for the injections in the office in the near do all measures to mitigate risk.

## 2018-05-29 ENCOUNTER — Other Ambulatory Visit: Payer: Self-pay | Admitting: Family Medicine

## 2018-05-29 NOTE — Telephone Encounter (Signed)
Refill done.  

## 2018-06-14 ENCOUNTER — Other Ambulatory Visit: Payer: Self-pay | Admitting: Family Medicine

## 2018-08-28 ENCOUNTER — Other Ambulatory Visit: Payer: Self-pay | Admitting: Family Medicine

## 2018-09-15 ENCOUNTER — Other Ambulatory Visit: Payer: Self-pay | Admitting: Family Medicine

## 2018-12-04 ENCOUNTER — Ambulatory Visit: Payer: Self-pay

## 2018-12-04 ENCOUNTER — Encounter: Payer: Self-pay | Admitting: Family Medicine

## 2018-12-04 ENCOUNTER — Other Ambulatory Visit: Payer: Self-pay

## 2018-12-04 ENCOUNTER — Ambulatory Visit (INDEPENDENT_AMBULATORY_CARE_PROVIDER_SITE_OTHER): Payer: PPO | Admitting: Family Medicine

## 2018-12-04 VITALS — BP 122/84 | HR 68 | Ht 72.0 in

## 2018-12-04 DIAGNOSIS — M79672 Pain in left foot: Secondary | ICD-10-CM | POA: Diagnosis not present

## 2018-12-04 DIAGNOSIS — M76822 Posterior tibial tendinitis, left leg: Secondary | ICD-10-CM | POA: Diagnosis not present

## 2018-12-04 DIAGNOSIS — M170 Bilateral primary osteoarthritis of knee: Secondary | ICD-10-CM

## 2018-12-04 NOTE — Assessment & Plan Note (Signed)
Posterior tibialis tendinitis.  Discussed icing regimen, heel lift, topical anti-inflammatories.  Patient will increase activity as tolerated.  Discussed proper shoes.  Avoid being barefoot.  Worsening pain consider injections.  Follow-up again in 4 to 8 weeks

## 2018-12-04 NOTE — Assessment & Plan Note (Signed)
Steroid injections given today.  Discussed the possibility of viscosupplementation if necessary.  Is improvement with this.  Discussed icing regimen.  Follow-up with me again in 4 to 8 weeks

## 2018-12-04 NOTE — Progress Notes (Signed)
[facility] [place], [place] [address] Phone: [phone] Subjective:   Fontaine No, am serving as a scribe for Dr. Hulan Saas.   CC: Bilateral knee pain follow-up  [id number]   05/05/2018 .  Try to get prior encouraged him to do icing, over-the-counter medications, meloxicam given for breakthrough pain.  Encouraged him to discontinue the Voltaren if he is taking it.  Plan at the moment.  Be following up with [place] for the injections in the office in the near do all measures to mitigate risk.  Update 12/04/2018 [name] is a 68 y.o. male coming in with complaint of bilateral knee pain. Patient has had injections before which helped to alleviate his pain. Pain is starting to come back.  Known arthritic changes in the knees bilaterally.  Patient has had increasing swelling and instability.  Affecting daily activities again.  Pain also in left foot over arch and medial malleolus. Pain started after being on his feet all day long. Unsure if he wore a shoe with much support. Has been wearing supportive shoes but has not gotten any better. Pain is dull with weightbearing.      Past Medical History:  Diagnosis Date  . Adenomatous colon polyp 2004  . Allergy   . Diverticulosis 2011  . Former smoker   . HLD (hyperlipidemia)   . Hyperplastic colon polyp 2004  . Hypertension   . Melanoma in situ [facility])    Past Surgical History:  Procedure Laterality Date  . COLONOSCOPY  2011   SEVERAL    Social History   Socioeconomic History  . Marital status: Married    Spouse name: Not on file  . Number of children: 2  . Years of education: Not on file  . Highest education level: Not on file  Occupational History  . Occupation: part Scientist, clinical (histocompatibility and immunogenetics): [employer]  Social Needs  . Financial resource strain: Not on file  . Food insecurity    Worry: Not on file    Inability: Not on file  . Transportation needs    Medical: Not on file   Non-medical: Not on file  Tobacco Use  . Smoking status: Former Smoker    Packs/day: 1.00    Years: 30.00    Pack years: 30.00    Types: Cigarettes, Cigars    Quit date: 05/13/1998    Years since quitting: 20.5  . Smokeless tobacco: Current User    Types: Chew  Substance and Sexual Activity  . Alcohol use: Yes    Comment: freq  . Drug use: No  . Sexual activity: Not on file  Lifestyle  . Physical activity    Days per week: Not on file    Minutes per session: Not on file  . Stress: Not on file  Relationships  . Social Herbalist on phone: Not on file    Gets together: Not on file    Attends religious service: Not on file    Active member of club or organization: Not on file    Attends meetings of clubs or organizations: Not on file    Relationship status: Not on file  Other Topics Concern  . Not on file  Social History Narrative  . Not on file   Allergies  Allergen Reactions  . Levaquin [Levofloxacin In D5w] Shortness Of Breath  . Crestor [Rosuvastatin Calcium] Diarrhea   Family History  Problem Relation Age of Onset  .  Lung cancer Father   . Heart disease Mother        died in her 31's  . Diabetes Daughter      Current Outpatient Medications (Cardiovascular):  .  losartan (COZAAR) 50 MG tablet, TAKE 1 TABLET (50 MG TOTAL) BY MOUTH DAILY.  Current Outpatient Medications (Respiratory):  .  cetirizine (ZYRTEC) 10 MG tablet, TAKE 1 TABLET BY MOUTH EVERY DAY .  fluticasone (FLONASE) 50 MCG/ACT nasal spray, Place 2 sprays into both nostrils daily.  Current Outpatient Medications (Analgesics):  .  diclofenac (VOLTAREN) 75 MG EC tablet, TAKE 1 TABLET BY MOUTH TWICE A DAY WITH FOOD AS NEEDED .  meloxicam (MOBIC) 15 MG tablet, TAKE 1 TABLET BY MOUTH EVERY DAY   Current Outpatient Medications (Other):  .  amoxicillin (AMOXIL) 875 MG tablet, Take 1 tablet (875 mg total) by mouth 2 (two) times daily. Marland Kitchen  gabapentin (NEURONTIN) 100 MG capsule, Take 2 capsules  (200 mg total) by mouth at bedtime. .  Vitamin D, Ergocalciferol, (DRISDOL) 50000 units CAPS capsule, TAKE 1 CAPSULE (50,000 UNITS TOTAL) BY MOUTH EVERY 7 (SEVEN) DAYS.    Past medical history, social, surgical and family history all reviewed in electronic medical record.  No pertanent information unless stated regarding to the chief complaint.   Review of Systems:  No headache, visual changes, nausea, vomiting, diarrhea, constipation, dizziness, abdominal pain, skin rash, fevers, chills, night sweats, weight loss, swollen lymph nodes, body aches, joint swelling, muscle aches, chest pain, shortness of breath, mood changes.   Objective  Blood pressure 122/84, pulse 68, height 6' (1.829 m), SpO2 99 %.    General: No apparent distress alert and oriented x3 mood and affect normal, dressed appropriately.  HEENT: Pupils equal, extraocular movements intact  Respiratory: Patient's speak in full sentences and does not appear short of breath  Cardiovascular: No lower extremity edema, non tender, no erythema  Skin: Warm dry intact with no signs of infection or rash on extremities or on axial skeleton.  Abdomen: Soft nontender  Neuro: Cranial nerves II through XII are intact, neurovascularly intact in all extremities with 2+ DTRs and 2+ pulses.  Lymph: No lymphadenopathy of posterior or anterior cervical chain or axillae bilaterally.  Gait antalgic MSK:  tender with limited range of motion and stability and symmetric strength and tone of shoulders, elbows, wrist, hip, and bilaterally.  Knee: Bilateral  valgus deformity noted. Large thigh to calf ratio.  Tender to palpation over medial and PF joint line.  ROM full in flexion and extension and lower leg rotation. instability with valgus force.  painful patellar compression. Patellar glide with moderate crepitus. Patellar and quadriceps tendons unremarkable. Hamstring and quadriceps strength is normal.  Left foot exam shows the patient does have  severe pes planus.  Patient does have tender to palpation over the navicular bone as well as the posterior tibialis tendon.  Limited musculoskeletal ultrasound was performed and interpreted by Lyndal Pulley  Limited ultrasound of patient's ankle shows some moderate arthritic changes.  Patient does have what appears to be a partial tear noted of the posterior tibialis.  No avulsion noted off the navicular bone.  No cortical irregularity of the navicular bone. Impression: Healing posterior tibialis tear   After informed written and verbal consent, patient was seated on exam table. Right knee was prepped with alcohol swab and utilizing anterolateral approach, patient's right knee space was injected with 4:1  marcaine 0.5%: Kenalog 40mg /dL. Patient tolerated the procedure well without immediate complications.  After  informed written and verbal consent, patient was seated on exam table. Left knee was prepped with alcohol swab and utilizing anterolateral approach, patient's left knee space was injected with 4:1  marcaine 0.5%: Kenalog 40mg /dL. Patient tolerated the procedure well without immediate complications.   Impression and Recommendations:     This case required medical decision making of moderate complexity. The above documentation has been reviewed and is accurate and complete Lyndal Pulley, DO       Note: This dictation was prepared with Dragon dictation along with smaller phrase technology. Any transcriptional errors that result from this process are unintentional.

## 2018-12-04 NOTE — Patient Instructions (Signed)
Heel Lift See me in 4-6 weeks

## 2018-12-16 ENCOUNTER — Other Ambulatory Visit: Payer: Self-pay

## 2018-12-27 ENCOUNTER — Other Ambulatory Visit: Payer: Self-pay | Admitting: Family Medicine

## 2019-01-08 ENCOUNTER — Other Ambulatory Visit: Payer: Self-pay | Admitting: Family Medicine

## 2019-01-08 ENCOUNTER — Ambulatory Visit (INDEPENDENT_AMBULATORY_CARE_PROVIDER_SITE_OTHER): Payer: PPO | Admitting: Family Medicine

## 2019-01-08 ENCOUNTER — Encounter: Payer: Self-pay | Admitting: Family Medicine

## 2019-01-08 DIAGNOSIS — M170 Bilateral primary osteoarthritis of knee: Secondary | ICD-10-CM

## 2019-01-08 NOTE — Progress Notes (Signed)
[name] [place], [place] [address] Phone: [phone] Subjective:   I [name] am serving as a Education administrator for Dr. Hulan Saas.  This visit occurred during the SARS-CoV-2 public health emergency.  Safety protocols were in place, including screening questions prior to the visit, additional usage of staff PPE, and extensive cleaning of exam room while observing appropriate contact time as indicated for disinfecting solutions.   I'm seeing this patient by the request  of:    CC: Bilateral knee pain follow-up  [id number]   12/04/2018 Posterior tibialis tendinitis.  Discussed icing regimen, heel lift, topical anti-inflammatories.  Patient will increase activity as tolerated.  Discussed proper shoes.  Avoid being barefoot.  Worsening pain consider injections.  Follow-up again in 4 to 8 weeks  Steroid injections given today.  Discussed the possibility of viscosupplementation if necessary.  Is improvement with this.  Discussed icing regimen.  Follow-up with me again in 4 to 8 weeks  Update 01/08/2019 ANDR… BRADING is a 68 y.o. male coming in with complaint of left foot and bilateral knee pain. Patient states he is doing a lot better.  Patient does have known arthritic changes.  Has responded fairly well both of viscosupplementation over the years.    Past Medical History:  Diagnosis Date  . Adenomatous colon polyp 2004  . Allergy   . Diverticulosis 2011  . Former smoker   . HLD (hyperlipidemia)   . Hyperplastic colon polyp 2004  . Hypertension   . Melanoma in situ [facility])    Past Surgical History:  Procedure Laterality Date  . COLONOSCOPY  2011   SEVERAL    Social History   Socioeconomic History  . Marital status: Married    Spouse name: Not on file  . Number of children: 2  . Years of education: Not on file  . Highest education level: Not on file  Occupational History  . Occupation: part Scientist, clinical (histocompatibility and immunogenetics): [employer]  Tobacco  Use  . Smoking status: Former Smoker    Packs/day: 1.00    Years: 30.00    Pack years: 30.00    Types: Cigarettes, Cigars    Quit date: 05/13/1998    Years since quitting: 20.6  . Smokeless tobacco: Current User    Types: Chew  Substance and Sexual Activity  . Alcohol use: Yes    Comment: freq  . Drug use: No  . Sexual activity: Not on file  Other Topics Concern  . Not on file  Social History Narrative  . Not on file   Social Determinants of Health   Financial Resource Strain:   . Difficulty of Paying Living Expenses: Not on file  Food Insecurity:   . Worried About Charity fundraiser in the Last Year: Not on file  . Ran Out of Food in the Last Year: Not on file  Transportation Needs:   . Lack of Transportation (Medical): Not on file  . Lack of Transportation (Non-Medical): Not on file  Physical Activity:   . Days of Exercise per Week: Not on file  . Minutes of Exercise per Session: Not on file  Stress:   . Feeling of Stress : Not on file  Social Connections:   . Frequency of Communication with Friends and Family: Not on file  . Frequency of Social Gatherings with Friends and Family: Not on file  . Attends Religious Services: Not on file  . Active Member of Clubs or  Organizations: Not on file  . Attends Archivist Meetings: Not on file  . Marital Status: Not on file   Allergies  Allergen Reactions  . Levaquin [Levofloxacin In D5w] Shortness Of Breath  . Crestor [Rosuvastatin Calcium] Diarrhea   Family History  Problem Relation Age of Onset  . Lung cancer Father   . Heart disease Mother        died in her 33's  . Diabetes Daughter      Current Outpatient Medications (Cardiovascular):  .  losartan (COZAAR) 50 MG tablet, TAKE 1 TABLET (50 MG TOTAL) BY MOUTH DAILY.  Current Outpatient Medications (Respiratory):  .  cetirizine (ZYRTEC) 10 MG tablet, TAKE 1 TABLET BY MOUTH EVERY DAY  Current Outpatient Medications (Analgesics):  .  diclofenac  (VOLTAREN) 75 MG EC tablet, TAKE 1 TABLET BY MOUTH TWICE A DAY WITH FOOD AS NEEDED      Past medical history, social, surgical and family history all reviewed in electronic medical record.  No pertanent information unless stated regarding to the chief complaint.   Review of Systems:  No headache, visual changes, nausea, vomiting, diarrhea, constipation, dizziness, abdominal pain, skin rash, fevers, chills, night sweats, weight loss, swollen lymph nodes, body aches, joint swelling, , chest pain, shortness of breath, mood changes.  Positive muscle aches Objective  Blood pressure 136/80, pulse 70, height 6' (1.829 m), SpO2 95 %.    General: No apparent distress alert and oriented x3 mood and affect normal, dressed appropriately.  HEENT: Pupils equal, extraocular movements intact  Respiratory: Patient's speak in full sentences and does not appear short of breath  Cardiovascular: No lower extremity edema, non tender, no erythema  Skin: Warm dry intact with no signs of infection or rash on extremities or on axial skeleton.  Abdomen: Soft nontender  Neuro: Cranial nerves II through XII are intact, neurovascularly intact in all extremities with 2+ DTRs and 2+ pulses.  Lymph: No lymphadenopathy of posterior or anterior cervical chain or axillae bilaterally.  Gait normal with good balance and coordination.  MSK:  tender with full range of motion and good stability and symmetric strength and tone of shoulders, elbows, wrist, hip, and ankles bilaterally.  Knee: bilateral  valgus deformity noted. Large thigh to calf ratio.  Tender to palpation over medial and PF joint line.  ROM full in flexion and extension and lower leg rotation. instability with valgus force.  painful patellar compression. Patellar glide with moderate crepitus. Patellar and quadriceps tendons unremarkable. Hamstring and quadriceps strength is normal.  After informed written and verbal consent, patient was seated on exam  table. Right knee was prepped with alcohol swab and utilizing anterolateral approach, patient's right knee space was injected with1 of Monovisc 22 mg/mL (sodium hyaluronate) in a prefilled syringe was injected easily into the knee through a 22-gauge needle..Patient tolerated the procedure well without immediate complications.  After informed written and verbal consent, patient was seated on exam table. Left knee was prepped with alcohol swab and utilizing anterolateral approach, patient's left knee space was injected withof Monovisc 22 mg/mL (sodium hyaluronate) in a prefilled syringe was injected easily into the knee through a 22-gauge needle..Patient tolerated the procedure well without immediate complications.    Impression and Recommendations:     This case required medical decision making of moderate complexity. The above documentation has been reviewed and is accurate and complete Lyndal Pulley, DO       Note: This dictation was prepared with Dragon dictation along with smaller phrase  technology. Any transcriptional errors that result from this process are unintentional.

## 2019-01-08 NOTE — Patient Instructions (Signed)
  [address] [place], [place] [address] Phone [phone]  Good to see you Gel injections 1 time Follow up 2 months

## 2019-01-08 NOTE — Assessment & Plan Note (Signed)
Viscosupplementation given again today.  Tolerated procedure well.  Discussed icing regimen and home exercise, which activities to do which wants to avoid.  Increase activity slowly.  Follow-up again in 4 to 8 weeks

## 2019-03-12 ENCOUNTER — Ambulatory Visit: Payer: PPO | Admitting: Family Medicine

## 2019-03-18 ENCOUNTER — Ambulatory Visit: Payer: PPO | Admitting: Family Medicine

## 2019-03-18 ENCOUNTER — Other Ambulatory Visit: Payer: Self-pay

## 2019-03-18 ENCOUNTER — Encounter: Payer: Self-pay | Admitting: Family Medicine

## 2019-03-18 DIAGNOSIS — M170 Bilateral primary osteoarthritis of knee: Secondary | ICD-10-CM

## 2019-03-18 MED ORDER — VITAMIN D (ERGOCALCIFEROL) 1.25 MG (50000 UNIT) PO CAPS: 50000.0000 [IU] | ORAL_CAPSULE | ORAL | 0 refills | Status: DC

## 2019-03-18 NOTE — Assessment & Plan Note (Signed)
Doing significantly better with viscosupplementation.  Discussed icing regimen and home exercise, which activities to do which wants to avoid.  Patient has done well with the viscosupplementation.  Chronic problem but is stable.  Follow-up with me as needed

## 2019-03-18 NOTE — Patient Instructions (Signed)
See me when you need me Pennsaid 2x daily Vit D at pharmacy

## 2019-03-18 NOTE — Progress Notes (Signed)
[place] Phone: [phone] Subjective:   Fontaine No, am serving as a scribe for Dr. Hulan Saas. This visit occurred during the SARS-CoV-2 public health emergency.  Safety protocols were in place, including screening questions prior to the visit, additional usage of staff PPE, and extensive cleaning of exam room while observing appropriate contact time as indicated for disinfecting solutions.   I'm seeing this patient by the request  of:  Susy Frizzle, MD  CC: Bilateral knee pain follow-up  [id number]   01/08/2019 Viscosupplementation given again today.  Tolerated procedure well.  Discussed icing regimen and home exercise, which activities to do which wants to avoid.  Increase activity slowly.  Follow-up again in 4 to 8 weeks  Update 03/18/2019 [name] is a 69 y.o. male coming in with complaint of bilateral knee pain. Patient states that the gel injections did help to alleviate his pain. Has soreness with bad weather over medial aspect of bilateral knee joints.  Patient states feeling about 85% better.  Has not noticed any swelling at the moment.  Happy with the result so far.    Past Medical History:  Diagnosis Date  . Adenomatous colon polyp 2004  . Allergy   . Diverticulosis 2011  . Former smoker   . HLD (hyperlipidemia)   . Hyperplastic colon polyp 2004  . Hypertension   . Melanoma in situ [facility])    Past Surgical History:  Procedure Laterality Date  . COLONOSCOPY  2011   SEVERAL    Social History   Socioeconomic History  . Marital status: Married    Spouse name: Not on file  . Number of children: 2  . Years of education: Not on file  . Highest education level: Not on file  Occupational History  . Occupation: part Scientist, clinical (histocompatibility and immunogenetics): [employer]  Tobacco Use  . Smoking status: Former Smoker    Packs/day: 1.00    Years: 30.00    Pack years: 30.00    Types: Cigarettes, Cigars      Quit date: 05/13/1998    Years since quitting: 20.8  . Smokeless tobacco: Current User    Types: Chew  Substance and Sexual Activity  . Alcohol use: Yes    Comment: freq  . Drug use: No  . Sexual activity: Not on file  Other Topics Concern  . Not on file  Social History Narrative  . Not on file   Social Determinants of Health   Financial Resource Strain:   . Difficulty of Paying Living Expenses: Not on file  Food Insecurity:   . Worried About Charity fundraiser in the Last Year: Not on file  . Ran Out of Food in the Last Year: Not on file  Transportation Needs:   . Lack of Transportation (Medical): Not on file  . Lack of Transportation (Non-Medical): Not on file  Physical Activity:   . Days of Exercise per Week: Not on file  . Minutes of Exercise per Session: Not on file  Stress:   . Feeling of Stress : Not on file  Social Connections:   . Frequency of Communication with Friends and Family: Not on file  . Frequency of Social Gatherings with Friends and Family: Not on file  . Attends Religious Services: Not on file  . Active Member of Clubs or Organizations: Not on file  . Attends Archivist Meetings: Not on file  .  Marital Status: Not on file   Allergies  Allergen Reactions  . Levaquin [Levofloxacin In D5w] Shortness Of Breath  . Crestor [Rosuvastatin Calcium] Diarrhea   Family History  Problem Relation Age of Onset  . Lung cancer Father   . Heart disease Mother        died in her 3's  . Diabetes Daughter      Current Outpatient Medications (Cardiovascular):  .  losartan (COZAAR) 50 MG tablet, TAKE 1 TABLET (50 MG TOTAL) BY MOUTH DAILY.  Current Outpatient Medications (Respiratory):  .  cetirizine (ZYRTEC) 10 MG tablet, TAKE 1 TABLET BY MOUTH EVERY DAY  Current Outpatient Medications (Analgesics):  .  diclofenac (VOLTAREN) 75 MG EC tablet, TAKE 1 TABLET BY MOUTH TWICE A DAY WITH FOOD AS NEEDED   Current Outpatient Medications (Other):  Marland Kitchen   Vitamin D, Ergocalciferol, (DRISDOL) 1.25 MG (50000 UNIT) CAPS capsule, Take 1 capsule (50,000 Units total) by mouth every 7 (seven) days.   Reviewed prior external information including notes and imaging from  primary care provider As well as notes that were available from care everywhere and other healthcare systems.  Past medical history, social, surgical and family history all reviewed in electronic medical record.  No pertanent information unless stated regarding to the chief complaint.   Review of Systems:  No headache, visual changes, nausea, vomiting, diarrhea, constipation, dizziness, abdominal pain, skin rash, fevers, chills, night sweats, weight loss, swollen lymph nodes, body aches, joint swelling, chest pain, shortness of breath, mood changes. POSITIVE muscle aches  Objective  Blood pressure 122/82, pulse 84, height 6' (1.829 m), weight 224 lb (101.6 kg), SpO2 95 %.   General: No apparent distress alert and oriented x3 mood and affect normal, dressed appropriately.  HEENT: Pupils equal, extraocular movements intact  Respiratory: Patient's speak in full sentences and does not appear short of breath  Cardiovascular: No lower extremity edema, non tender, no erythema  Skin: Warm dry intact with no signs of infection or rash on extremities or on axial skeleton.  Abdomen: Soft nontender  Neuro: Cranial nerves II through XII are intact, neurovascularly intact in all extremities with 2+ DTRs and 2+ pulses.  Lymph: No lymphadenopathy of posterior or anterior cervical chain or axillae bilaterally.  Gait normal with good balance and coordination.  MSK:  Non tender with full range of motion and good stability and symmetric strength and tone of shoulders, elbows, wrist, hip, knee and ankles bilaterally.     Impression and Recommendations:      The above documentation has been reviewed and is accurate and complete Michael Pulley, DO       Note: This dictation was prepared with  Dragon dictation along with smaller phrase technology. Any transcriptional errors that result from this process are unintentional.

## 2019-03-26 DIAGNOSIS — Z20822 Contact with and (suspected) exposure to covid-19: Secondary | ICD-10-CM | POA: Diagnosis not present

## 2019-03-31 ENCOUNTER — Other Ambulatory Visit: Payer: Self-pay | Admitting: Family Medicine

## 2019-04-02 ENCOUNTER — Other Ambulatory Visit: Payer: Self-pay | Admitting: Family Medicine

## 2019-04-02 ENCOUNTER — Other Ambulatory Visit: Payer: Self-pay

## 2019-04-06 DIAGNOSIS — U071 COVID-19: Secondary | ICD-10-CM | POA: Diagnosis not present

## 2019-04-06 DIAGNOSIS — Z20822 Contact with and (suspected) exposure to covid-19: Secondary | ICD-10-CM | POA: Diagnosis not present

## 2019-04-12 ENCOUNTER — Telehealth (INDEPENDENT_AMBULATORY_CARE_PROVIDER_SITE_OTHER): Payer: PPO | Admitting: Family Medicine

## 2019-04-12 ENCOUNTER — Other Ambulatory Visit: Payer: Self-pay

## 2019-04-12 DIAGNOSIS — U071 COVID-19: Secondary | ICD-10-CM

## 2019-04-12 MED ORDER — DIPHENOXYLATE-ATROPINE 2.5-0.025 MG PO TABS: 2.0000 | ORAL_TABLET | Freq: Four times a day (QID) | ORAL | 0 refills | Status: DC | PRN

## 2019-04-12 MED ORDER — PROMETHAZINE HCL 25 MG PO TABS: 25.0000 mg | ORAL_TABLET | Freq: Three times a day (TID) | ORAL | 0 refills | Status: DC | PRN

## 2019-04-12 NOTE — Progress Notes (Signed)
Subjective:    Patient ID: [name], male    DOB: 09/26/1950, 69 y.o.   MRN: [id number]  HPI Patient is a very pleasant 69 year old Caucasian male who is being seen today as a telephone visit.  He consents to be seen via telephone.  Phone call began at 920.  Phone call concluded at 930.  Patient states that he was diagnosed with COVID-19 1 week ago.  He reports rhinorrhea and head congestion.  He denies any cough.  He denies any shortness of breath.  He denies any chest pain.  However he is having nausea.  He states that his stomach is extremely upset he does not feel like eating anything.  He is not throwing up however the nausea is keeping him from eating.  He also states that anything he eats is going straight through him.  He reports copious watery diarrhea.  He denies any abdominal pain.  He denies any melena or hematochezia.  He denies any symptoms of an acute abdomen or bowel obstruction. Past Medical History:  Diagnosis Date  . Adenomatous colon polyp 2004  . Allergy   . Diverticulosis 2011  . Former smoker   . HLD (hyperlipidemia)   . Hyperplastic colon polyp 2004  . Hypertension   . Melanoma in situ [facility])    Past Surgical History:  Procedure Laterality Date  . COLONOSCOPY  2011   SEVERAL    Current Outpatient Medications on File Prior to Visit  Medication Sig Dispense Refill  . cetirizine (ZYRTEC) 10 MG tablet TAKE 1 TABLET BY MOUTH EVERY DAY 90 tablet 3  . diclofenac (VOLTAREN) 75 MG EC tablet TAKE 1 TABLET BY MOUTH TWICE A DAY WITH FOOD AS NEEDED 180 tablet 0  . fluticasone (FLONASE) 50 MCG/ACT nasal spray SPRAY 2 SPRAYS INTO EACH NOSTRIL EVERY DAY 48 mL 0  . losartan (COZAAR) 50 MG tablet TAKE 1 TABLET (50 MG TOTAL) BY MOUTH DAILY. 90 tablet 3  . Vitamin D, Ergocalciferol, (DRISDOL) 1.25 MG (50000 UNIT) CAPS capsule Take 1 capsule (50,000 Units total) by mouth every 7 (seven) days. 12 capsule 0   No current facility-administered medications on file prior to visit.    Allergies  Allergen Reactions  . Levaquin [Levofloxacin In D5w] Shortness Of Breath  . Crestor [Rosuvastatin Calcium] Diarrhea   Social History   Socioeconomic History  . Marital status: Married    Spouse name: Not on file  . Number of children: 2  . Years of education: Not on file  . Highest education level: Not on file  Occupational History  . Occupation: part Scientist, clinical (histocompatibility and immunogenetics): [employer]  Tobacco Use  . Smoking status: Former Smoker    Packs/day: 1.00    Years: 30.00    Pack years: 30.00    Types: Cigarettes, Cigars    Quit date: 05/13/1998    Years since quitting: 20.9  . Smokeless tobacco: Current User    Types: Chew  Substance and Sexual Activity  . Alcohol use: Yes    Comment: freq  . Drug use: No  . Sexual activity: Not on file  Other Topics Concern  . Not on file  Social History Narrative  . Not on file   Social Determinants of Health   Financial Resource Strain:   . Difficulty of Paying Living Expenses:   Food Insecurity:   . Worried About Charity fundraiser in the Last Year:   . [place] in the Last Year:  Transportation Needs:   . Film/video editor (Medical):   Marland Kitchen Lack of Transportation (Non-Medical):   Physical Activity:   . Days of Exercise per Week:   . Minutes of Exercise per Session:   Stress:   . Feeling of Stress :   Social Connections:   . Frequency of Communication with Friends and Family:   . Frequency of Social Gatherings with Friends and Family:   . Attends Religious Services:   . Active Member of Clubs or Organizations:   . Attends Archivist Meetings:   Marland Kitchen Marital Status:   Intimate Partner Violence:   . Fear of Current or Ex-Partner:   . Emotionally Abused:   Marland Kitchen Physically Abused:   . Sexually Abused:       Review of Systems  All other systems reviewed and are negative.      Objective:   Physical Exam        Assessment & Plan:  [id number]  Patient has had symptoms for approximately 7  days.  I explained to the patient that unfortunately there are no medications to be given in the outpatient setting to cure the disease.  Unfortunately we have to wait for his immune system to fight off a viral infection.  However we can definitely help with symptoms.  I would recommend Phenergan 25 mg every 6 hours as needed for nausea and vomiting.  I encouraged the patient to push fluids.  He can use Motrin and Tylenol as needed for fever or body aches.  He can use Lomotil 2 tablets every 6 hours as needed for diarrhea.  If the patient develops any chest pain or shortness of breath, I recommended he go immediately to the hospital however at the present time he does not have any symptoms of require hospitalization.

## 2019-05-13 ENCOUNTER — Other Ambulatory Visit: Payer: Self-pay | Admitting: Family Medicine

## 2019-06-08 ENCOUNTER — Ambulatory Visit: Payer: PPO | Admitting: Family Medicine

## 2019-06-08 ENCOUNTER — Ambulatory Visit: Payer: Self-pay

## 2019-06-08 ENCOUNTER — Other Ambulatory Visit: Payer: Self-pay

## 2019-06-08 DIAGNOSIS — M533 Sacrococcygeal disorders, not elsewhere classified: Secondary | ICD-10-CM | POA: Diagnosis not present

## 2019-06-08 DIAGNOSIS — S39012A Strain of muscle, fascia and tendon of lower back, initial encounter: Secondary | ICD-10-CM | POA: Diagnosis not present

## 2019-06-08 MED ORDER — TRAMADOL HCL 50 MG PO TABS: 50.0000 mg | ORAL_TABLET | Freq: Three times a day (TID) | ORAL | 0 refills | Status: DC | PRN

## 2019-06-08 MED ORDER — TIZANIDINE HCL 4 MG PO TABS: 4.0000 mg | ORAL_TABLET | Freq: Four times a day (QID) | ORAL | 1 refills | Status: DC | PRN

## 2019-06-08 NOTE — Patient Instructions (Addendum)
Thank you for coming in today. Plan for PT.  Use the tizadinie muscle relexaer up to every 6 hours for pain as neded.  It may make you sleepy.  Ok to continue diclofenac pills. Do not take with ibuprofen or aleve. Ok to take wit tylenol.  Tramadol for severe pain. It also may make you sleepy.  Use heating pad and TENS unit.   TENS UNIT: This is helpful for muscle pain and spasm.   Search and Purchase a TENS 7000 2nd edition at  www.tenspros.com or www.[employer].com It should be less than $30.     TENS unit instructions: Do not shower or bathe with the unit on Turn the unit off before removing electrodes or batteries If the electrodes lose stickiness add a drop of water to the electrodes after they are disconnected from the unit and place on plastic sheet. If you continued to have difficulty, call the TENS unit company to purchase more electrodes. Do not apply lotion on the skin area prior to use. Make sure the skin is clean and dry as this will help prolong the life of the electrodes. After use, always check skin for unusual red areas, rash or other skin difficulties. If there are any skin problems, does not apply electrodes to the same area. Never remove the electrodes from the unit by pulling the wires. Do not use the TENS unit or electrodes other than as directed. Do not change electrode placement without consultating your therapist or physician. Keep 2 fingers with between each electrode. Wear time ratio is 2:1, on to off times.    For example on for 30 minutes off for 15 minutes and then on for 30 minutes off for 15 minutes   Call or go to the ER if you develop a large red swollen joint with extreme pain or oozing puss.   Let me know if not better.    Lumbosacral Strain Lumbosacral strain is an injury that causes pain in the lower back (lumbosacral spine). This injury usually happens from overstretching the muscles or ligaments along your spine. Ligaments are cord-like tissues  that connect bones to other bones. A strain can affect one or more muscles or ligaments. What are the causes? This condition may be caused by:  A hard, direct hit to the back.  Overstretching the lower back muscles. This may result from: ? A fall. ? Lifting something heavy. ? Repetitive movements such as bending or crouching. What increases the risk? The following factors may make you more likely to develop this condition:  Participating in sports or activities that involve: ? A sudden twist of the back. ? Pushing or pulling motions.  Being overweight or obese.  Having poor strength and flexibility, especially tight hamstrings or weak muscles in the back or abdomen.  Having too much of a curve in the lower back.  Having a pelvis that is tilted forward. What are the signs or symptoms? The main symptom of this condition is pain in the lower back, at the site of the strain. Pain may also be felt down one or both legs. How is this diagnosed? This condition is diagnosed based on your symptoms, your medical history, and a physical exam. During the physical exam, your health care provider may push on certain areas of your back to find the source of your pain. You may be asked to bend forward, backward, and side to side to check your pain and range of motion. You may also have imaging tests, such  as X-rays and an MRI. How is this treated? This condition may be treated by:  Applying heat and cold on the affected area.  Taking medicines to help relieve pain and relax your muscles.  Taking NSAIDs, such as ibuprofen, to help reduce swelling and discomfort.  Doing stretching and strengthening exercises for your lower back. Symptoms usually improve within several weeks of treatment. However, recovery time varies. When your symptoms improve, gradually return to your normal routine as soon as possible to reduce pain, avoid stiffness, and keep muscle strength. Follow these instructions at  home: Medicines  Take over-the-counter and prescription medicines only as told by your health care provider.  Ask your health care provider if the medicine prescribed to you: ? Requires you to avoid driving or using heavy machinery. ? Can cause constipation. You may need to take these actions to prevent or treat constipation:  Drink enough fluid to keep your urine pale yellow.  Take over-the-counter or prescription medicines.  Eat foods that are high in fiber, such as beans, whole grains, and fresh fruits and vegetables.  Limit foods that are high in fat and processed sugars, such as fried or sweet foods. Managing pain, stiffness, and swelling      If directed, put ice on the injured area. To do this: ? Put ice in a plastic bag. ? Place a towel between your skin and the bag. ? Leave the ice on for 20 minutes, 2-3 times a day.  If directed, apply heat on the affected area as often as told by your health care provider. Use the heat source that your health care provider recommends, such as a moist heat pack or a heating pad. ? Place a towel between your skin and the heat source. ? Leave the heat on for 20-30 minutes. ? Remove the heat if your skin turns bright red. This is especially important if you are unable to feel pain, heat, or cold. You may have a greater risk of getting burned. Activity  Rest as told by your health care provider.  Do not stay in bed. Staying in bed for more than 1-2 days can delay your recovery.  Return to your normal activities as told by your health care provider. Ask your health care provider what activities are safe for you.  Avoid activities that take a lot of energy for as long as told by your health care provider.  Do exercises as told by your health care provider. This includes stretching and strengthening exercises. General instructions  Sit up and stand up straight. Avoid leaning forward when you sit, or hunching over when you stand.  Do  not use any products that contain nicotine or tobacco, such as cigarettes, e-cigarettes, and chewing tobacco. If you need help quitting, ask your health care provider.  Keep all follow-up visits as told by your health care provider. This is important. How is this prevented?   Use correct form when playing sports and lifting heavy objects.  Use good posture when sitting and standing.  Maintain a healthy weight.  Sleep on a mattress with medium firmness to support your back.  Do at least 150 minutes of moderate-intensity exercise each week, such as brisk walking or water aerobics. Try a form of exercise that takes stress off your back, such as swimming or stationary cycling.  Maintain physical fitness, including: ? Strength. ? Flexibility. Contact a health care provider if:  Your back pain does not improve after several weeks of treatment.  Your symptoms  get worse. Get help right away if:  Your back pain is severe.  You cannot stand or walk.  You have difficulty controlling when you urinate or when you have a bowel movement.  You feel nauseous or you vomit.  Your feet or legs get very cold, turn pale, or look blue.  You have numbness, tingling, weakness, or problems using your arms or legs.  You develop any of the following: ? Shortness of breath. ? Dizziness. ? Pain in your legs. ? Weakness in your buttocks or legs. Summary  Lumbosacral strain is an injury that causes pain in the lower back (lumbosacral spine).  This injury usually happens from overstretching the muscles or ligaments along your spine.  This condition may be caused by a direct hit to the lower back or by overstretching the lower back muscles.  Symptoms usually improve within several weeks of treatment. This information is not intended to replace advice given to you by your health care provider. Make sure you discuss any questions you have with your health care provider. Document Revised: 06/02/2018  Document Reviewed: 06/02/2018 Elsevier Patient Education  [place].

## 2019-06-08 NOTE — Progress Notes (Signed)
I, Wendy Poet, LAT, ATC, am serving as scribe for Dr. Lynne Leader.  [name] is a 69 y.o. male who presents to [place] at [facility] today for low back pain.  He was last seen by Dr. Tamala Julian on 03/18/19 for B knee pain.  Since his last visit, he notes low back pain starting yesterday .  He rates his pain as and describes his pain as moderate or severe and located in the right low back. No injury history. Worsening this morning. No radiating pan past knee. Works for he [employer].   Tried diclofenac pills which have not helped. Tried some OTC medications as well.   Radiating pain: No LE numbness/tingling: No LE weakness: No Aggravating factors: Motion  Treatments tried: Oral diclofenac, and OTC medications.   Diagnostic imaging: L-spine XR- 05/21/17  Pertinent review of systems: No fever, chills, NVD, weight loss.   Relevant historical information: Hx lumbar DDD and knee DJD   Exam:   General: Well Developed, well nourished, and in no acute distress.   MSK: L-spine normal-appearing Nontender spinal midline. Tender palpation right SI joint region. Decreased lumbar motion throughout.  Right lateral flexion worse than left lateral flexion. Lower extremity strength reflexes and sensation are intact and equal bilaterally. Antalgic gait.    Lab and Radiology Results  Procedure: Real-time Ultrasound Guided Injection of right SI joint Device: Philips Affiniti 50G Images permanently stored and available for review in the ultrasound unit. Verbal informed consent obtained.  Discussed risks and benefits of procedure. Warned about infection bleeding damage to structures skin hypopigmentation and fat atrophy among others. Patient expresses understanding and agreement Time-out conducted.   Noted no overlying erythema, induration, or other signs of local infection.   Skin prepped in a sterile fashion.   Local anesthesia: Topical Ethyl  chloride.   With sterile technique and under real time ultrasound guidance:  40 mg of Kenalog and 2 mL of Marcaine injected easily.   Completed without difficulty   Pain partially resolved suggesting accurate placement of the medication.   Advised to call if fevers/chills, erythema, induration, drainage, or persistent bleeding.   Images permanently stored and available for review in the ultrasound unit.  Impression: Technically successful ultrasound guided injection.        EXAM: LUMBAR SPINE - COMPLETE 4+ VIEW  COMPARISON:  Coronal and sagittal reconstructed images through the lumbar spine from an abdominal and pelvic CT scan dated May 31, 2013  FINDINGS: There is mild dextrocurvature of the lumbar spine centered at L2. The vertebral bodies are preserved in height. The pedicles and transverse processes are intact. There is moderate degenerative disc space narrowing at L4-5 and L5-S1 with vacuum phenomena. There is milder disc space narrowing at L2-3 and L3-4. There is facet joint hypertrophy at L4-5 and L5-S1. There is grade 1 anterolisthesis of L4 with respect L5 amounting to approximately 3 mm.  IMPRESSION: Degenerative disc and facet joint change in the lumbar spine as described. Mild grade 1 anterolisthesis of L4 with respect L5. No compression fracture. There has not been dramatic interval change since the previous study.   Electronically Signed   By: David  Martinique M.D.   On: 05/22/2017 08:43 I, Lynne Leader, personally (independently) visualized and performed the interpretation of the images attached in this note.       Assessment and Plan: 69 y.o. male with right low back pain starting yesterday without injury.  Pain most consistent with lumbosacral strain  muscle spasm and dysfunction.  He does have some SI joint pain as well which I treated with injection today.  He rates his pain as severe but fortunately does not have any radicular or neurologic component  to his pain.  Plan to treat with continued oral diclofenac.  Okay to add Tylenol.  Will use tizanidine and tramadol for spasm and pain.  Caution against sedation with these medications. Main treatment will be physical therapy. Recommend heating pad and TENS unit as well.  Recheck back with myself or Dr. Tamala Julian as needed.   PDMP reviewed during this encounter. Orders Placed This Encounter  Procedures  . [place] LIMITED JOINT SPACE STRUCTURES LOW RIGHT(NO LINKED CHARGES)    Order Specific Question:   Reason for Exam (SYMPTOM  OR DIAGNOSIS REQUIRED)    Answer:   si joint inj    Order Specific Question:   Preferred imaging location?    Answer:   [place]  . Ambulatory referral to Physical Therapy    Referral Priority:   Routine    Referral Type:   Physical Medicine    Referral Reason:   Specialty Services Required    Requested Specialty:   Physical Therapy   Meds ordered this encounter  Medications  . tiZANidine (ZANAFLEX) 4 MG tablet    Sig: Take 1 tablet (4 mg total) by mouth every 6 (six) hours as needed for muscle spasms.    Dispense:  30 tablet    Refill:  1  . traMADol (ULTRAM) 50 MG tablet    Sig: Take 1 tablet (50 mg total) by mouth every 8 (eight) hours as needed for severe pain.    Dispense:  15 tablet    Refill:  0     Discussed warning signs or symptoms. Please see discharge instructions. Patient expresses understanding.   The above documentation has been reviewed and is accurate and complete Lynne Leader, M.D.

## 2019-06-09 ENCOUNTER — Ambulatory Visit: Payer: PPO | Admitting: Family Medicine

## 2019-06-14 ENCOUNTER — Telehealth: Payer: Self-pay | Admitting: Family Medicine

## 2019-06-14 NOTE — Telephone Encounter (Signed)
Called patient and informed. He is going to talk to Dr Tamala Julian about it tomorrow at his visit.

## 2019-06-14 NOTE — Telephone Encounter (Signed)
He can try it but would do [employer]

## 2019-06-14 NOTE — Telephone Encounter (Signed)
Patient called asking if Dr Tamala Julian would recommend an inversion machine for him? He said that when when lays down and stretches out his back, it seems to help take some of the pressure off. (he found some on sale and thought about ordering one, but wanted to check with [place] first)  Please advise.

## 2019-06-15 ENCOUNTER — Ambulatory Visit (INDEPENDENT_AMBULATORY_CARE_PROVIDER_SITE_OTHER): Payer: PPO | Admitting: Family Medicine

## 2019-06-15 ENCOUNTER — Other Ambulatory Visit: Payer: Self-pay

## 2019-06-15 ENCOUNTER — Encounter: Payer: Self-pay | Admitting: Family Medicine

## 2019-06-15 DIAGNOSIS — M79604 Pain in right leg: Secondary | ICD-10-CM

## 2019-06-15 DIAGNOSIS — M545 Low back pain, unspecified: Secondary | ICD-10-CM | POA: Insufficient documentation

## 2019-06-15 MED ORDER — METHYLPREDNISOLONE ACETATE 80 MG/ML IJ SUSP
80.0000 mg | Freq: Once | INTRAMUSCULAR | Status: AC
  Administered 2019-06-15: 80 mg via INTRAMUSCULAR

## 2019-06-15 MED ORDER — KETOROLAC TROMETHAMINE 60 MG/2ML IM SOLN
60.0000 mg | Freq: Once | INTRAMUSCULAR | Status: AC
  Administered 2019-06-15: 60 mg via INTRAMUSCULAR

## 2019-06-15 NOTE — Assessment & Plan Note (Signed)
Low back pain the patient does seem to be progressing with some mild radicular symptoms going down the right leg.  Seem to be more of a muscle spasm.  Did not respond well to the sacroiliac joint.  Toradol and Depo-Medrol given today to help with some pain relief.  MRI ordered.  Likely no weakness at the moment.  Could be a candidate for epidurals.  Patient will increase activity slowly.  Follow-up with me again in 4 to 8 weeks

## 2019-06-15 NOTE — Progress Notes (Signed)
[place] Phone: [phone] Subjective:   [name], am serving as a scribe for Dr. Hulan Saas. This visit occurred during the SARS-CoV-2 public health emergency.  Safety protocols were in place, including screening questions prior to the visit, additional usage of staff PPE, and extensive cleaning of exam room while observing appropriate contact time as indicated for disinfecting solutions.   I'm seeing this patient by the request  of:  Susy Frizzle, MD  CC: Low back pain  [id number]  [name] is a 69 y.o. male coming in with complaint of right sided lower back pain. Last seen on 03/18/2019 for bilateral knee pain. Patient states that the cortisone injection last week did not help alleviate his pain. Woke up in pain one week ago. Pain intermittently has radiates down right leg. Has been using Tramadol and Zanaflex for pain relief.  Patient states that it is severe.  Having difficulties with daily activities at the moment.  Patient rates the severity of pain is 9 out of 10.  Patient did see Dr. Georgina Snell and did have the sacroiliac on the right side injected with French significant resolution of pain.  Continues to take tramadol nightly for the pain at the moment.     Past Medical History:  Diagnosis Date  . Adenomatous colon polyp 2004  . Allergy   . Diverticulosis 2011  . Former smoker   . HLD (hyperlipidemia)   . Hyperplastic colon polyp 2004  . Hypertension   . Melanoma in situ [facility])    Past Surgical History:  Procedure Laterality Date  . COLONOSCOPY  2011   SEVERAL    Social History   Socioeconomic History  . Marital status: Married    Spouse name: Not on file  . Number of children: 2  . Years of education: Not on file  . Highest education level: Not on file  Occupational History  . Occupation: part Scientist, clinical (histocompatibility and immunogenetics): [employer]  Tobacco Use  . Smoking status: Former Smoker   Packs/day: 1.00    Years: 30.00    Pack years: 30.00    Types: Cigarettes, Cigars    Quit date: 05/13/1998    Years since quitting: 21.1  . Smokeless tobacco: Current User    Types: Chew  Substance and Sexual Activity  . Alcohol use: Yes    Comment: freq  . Drug use: French  . Sexual activity: Not on file  Other Topics Concern  . Not on file  Social History Narrative  . Not on file   Social Determinants of Health   Financial Resource Strain:   . Difficulty of Paying Living Expenses:   Food Insecurity:   . Worried About Charity fundraiser in the Last Year:   . Arboriculturist in the Last Year:   Transportation Needs:   . Film/video editor (Medical):   Marland Kitchen Lack of Transportation (Non-Medical):   Physical Activity:   . Days of Exercise per Week:   . Minutes of Exercise per Session:   Stress:   . Feeling of Stress :   Social Connections:   . Frequency of Communication with Friends and Family:   . Frequency of Social Gatherings with Friends and Family:   . Attends Religious Services:   . Active Member of Clubs or Organizations:   . Attends Archivist Meetings:   Marland Kitchen Marital Status:    Allergies  Allergen Reactions  . Levaquin [Levofloxacin In D5w] Shortness Of Breath  . Crestor [Rosuvastatin Calcium] Diarrhea   Family History  Problem Relation Age of Onset  . Lung cancer Father   . Heart disease Mother        died in her 58's  . Diabetes Daughter      Current Outpatient Medications (Cardiovascular):  .  losartan (COZAAR) 50 MG tablet, TAKE 1 TABLET (50 MG TOTAL) BY MOUTH DAILY.  Current Outpatient Medications (Respiratory):  .  cetirizine (ZYRTEC) 10 MG tablet, TAKE 1 TABLET BY MOUTH EVERY DAY .  fluticasone (FLONASE) 50 MCG/ACT nasal spray, SPRAY 2 SPRAYS INTO EACH NOSTRIL EVERY DAY .  promethazine (PHENERGAN) 25 MG tablet, Take 1 tablet (25 mg total) by mouth every 8 (eight) hours as needed for nausea or vomiting.  Current Outpatient Medications  (Analgesics):  .  diclofenac (VOLTAREN) 75 MG EC tablet, TAKE 1 TABLET BY MOUTH TWICE A DAY WITH FOOD AS NEEDED .  traMADol (ULTRAM) 50 MG tablet, Take 1 tablet (50 mg total) by mouth every 8 (eight) hours as needed for severe pain.   Current Outpatient Medications (Other):  .  diphenoxylate-atropine (LOMOTIL) 2.5-0.025 MG tablet, Take 2 tablets by mouth 4 (four) times daily as needed for diarrhea or loose stools. Marland Kitchen  tiZANidine (ZANAFLEX) 4 MG tablet, Take 1 tablet (4 mg total) by mouth every 6 (six) hours as needed for muscle spasms. .  Vitamin D, Ergocalciferol, (DRISDOL) 1.25 MG (50000 UNIT) CAPS capsule, TAKE 1 CAPSULE (50,000 UNITS TOTAL) BY MOUTH EVERY 7 (SEVEN) DAYS.   Reviewed prior external information including notes and imaging from  primary care provider As well as notes that were available from care everywhere and other healthcare systems.  Past medical history, social, surgical and family history all reviewed in electronic medical record.  French pertanent information unless stated regarding to the chief complaint.   Review of Systems:  French headache, visual changes, nausea, vomiting, diarrhea, constipation, dizziness, abdominal pain, skin rash, fevers, chills, night sweats, weight loss, swollen lymph nodes,  joint swelling, chest pain, shortness of breath, mood changes. POSITIVE muscle aches, body aches  Objective  Blood pressure 130/74, pulse 68, height 6' (1.829 m), weight 220 lb (99.8 kg), SpO2 97 %.   General: French apparent distress alert and oriented x3 mood and affect normal, dressed appropriately.  HEENT: Pupils equal, extraocular movements intact  Respiratory: Patient's speak in full sentences and does not appear short of breath  Cardiovascular: French lower extremity edema, non tender, French erythema  Neuro: Cranial nerves II through XII are intact, neurovascularly intact in all extremities with 2+ DTRs and 2+ pulses.  Gait n mild antalgic Patient comes back and does have  tenderness paraspinal musculature right greater than left.  Patient does have tightness surrounding area.  Patient has had increasing tightness of the right lower extremity with forward flexion of 25 degrees but patient denies any true numbness.  Strength appears to be intact at the moment.  Patient is favoring the right side significantly with any type of ambulation.     Impression and Recommendations:     This case required medical decision making of moderate complexity. The above documentation has been reviewed and is accurate and complete Lyndal Pulley, DO       Note: This dictation was prepared with Dragon dictation along with smaller phrase technology. Any transcriptional errors that result from this process are unintentional.

## 2019-06-15 NOTE — Patient Instructions (Signed)
[facility] [phone] Injections in backside today

## 2019-06-29 ENCOUNTER — Ambulatory Visit: Payer: PPO | Admitting: Physical Therapy

## 2019-07-04 ENCOUNTER — Other Ambulatory Visit: Payer: Self-pay | Admitting: Family Medicine

## 2019-07-07 ENCOUNTER — Other Ambulatory Visit: Payer: Self-pay | Admitting: Family Medicine

## 2019-07-08 ENCOUNTER — Ambulatory Visit
Admission: RE | Admit: 2019-07-08 | Discharge: 2019-07-08 | Disposition: A | Discharge status: Home / Self Care | Payer: PPO | Source: Ambulatory Visit | Attending: Family Medicine | Admitting: Family Medicine

## 2019-07-08 ENCOUNTER — Other Ambulatory Visit: Payer: Self-pay

## 2019-07-08 DIAGNOSIS — M48061 Spinal stenosis, lumbar region without neurogenic claudication: Secondary | ICD-10-CM | POA: Diagnosis not present

## 2019-07-08 DIAGNOSIS — M79604 Pain in right leg: Secondary | ICD-10-CM

## 2019-07-08 DIAGNOSIS — M545 Low back pain, unspecified: Secondary | ICD-10-CM

## 2019-07-12 ENCOUNTER — Encounter: Payer: Self-pay | Admitting: Family Medicine

## 2019-07-30 ENCOUNTER — Other Ambulatory Visit: Payer: Self-pay | Admitting: Family Medicine

## 2019-09-13 ENCOUNTER — Other Ambulatory Visit: Payer: Self-pay

## 2019-09-13 ENCOUNTER — Ambulatory Visit: Payer: Self-pay

## 2019-09-13 ENCOUNTER — Ambulatory Visit (INDEPENDENT_AMBULATORY_CARE_PROVIDER_SITE_OTHER): Payer: PPO | Admitting: Family Medicine

## 2019-09-13 ENCOUNTER — Encounter: Payer: Self-pay | Admitting: Family Medicine

## 2019-09-13 VITALS — BP 120/80 | HR 72 | Ht 72.0 in | Wt 223.0 lb

## 2019-09-13 DIAGNOSIS — M25561 Pain in right knee: Secondary | ICD-10-CM

## 2019-09-13 DIAGNOSIS — G8929 Other chronic pain: Secondary | ICD-10-CM | POA: Diagnosis not present

## 2019-09-13 DIAGNOSIS — M25562 Pain in left knee: Secondary | ICD-10-CM

## 2019-09-13 DIAGNOSIS — S76302A Unspecified injury of muscle, fascia and tendon of the posterior muscle group at thigh level, left thigh, initial encounter: Secondary | ICD-10-CM

## 2019-09-13 NOTE — Assessment & Plan Note (Signed)
Patient has more of a hamstring injury.  Discussed with patient in great length about icing regimen, home thigh compression, work with athletic trainer to learn home exercises in greater detail, increase activity slowly.  Follow-up with me again in 4 to 8 weeks.

## 2019-09-13 NOTE — Patient Instructions (Signed)
Thigh compression sleeve [place] Avoid going down inclines or down stairs See me in 5-6 weeks

## 2019-09-13 NOTE — Progress Notes (Signed)
[place] Phone: [phone] Subjective:   Michael [name], am serving as a scribe for Michael [name]. This visit occurred during the SARS-CoV-2 public health emergency.  Safety protocols were in place, including screening questions prior to the visit, additional usage of staff PPE, and extensive cleaning of exam room while observing appropriate contact time as indicated for disinfecting solutions.  I'm seeing this patient by the request  of:  Michael Frizzle, MD  CC: Left knee pain  [id number]  Michael [name] is a 69 y.o. male coming in with complaint of bilateral knee pain. Visco given 01/08/2019. Patient states that his knee pain has been increasing. L>R. Left leg gave out on him last week. Lateral quad and hamstring is sore since almost falling. Taking Tylenol and IBU. Using heat and compression. Felt gel injections worked well to alleviate his pain.  Patient states TTP still but making improvements      Past Medical History:  Diagnosis Date  . Adenomatous colon polyp 2004  . Allergy   . Diverticulosis 2011  . Former smoker   . HLD (hyperlipidemia)   . Hyperplastic colon polyp 2004  . Hypertension   . Melanoma in situ [facility])    Past Surgical History:  Procedure Laterality Date  . COLONOSCOPY  2011   SEVERAL    Social History   Socioeconomic History  . Marital status: Married    Spouse name: Not on file  . Number of children: 2  . Years of education: Not on file  . Highest education level: Not on file  Occupational History  . Occupation: part Scientist, clinical (histocompatibility and immunogenetics): [employer]  Tobacco Use  . Smoking status: Former Smoker    Packs/day: 1.00    Years: 30.00    Pack years: 30.00    Types: Cigarettes, Cigars    Quit date: 05/13/1998    Years since quitting: 21.3  . Smokeless tobacco: Current User    Types: Chew  Substance and Sexual Activity  . Alcohol use: Yes    Comment: freq  . Drug use:  [name]  . Sexual activity: Not on file  Other Topics Concern  . Not on file  Social History Narrative  . Not on file   Social Determinants of Health   Financial Resource Strain:   . Difficulty of Paying Living Expenses: Not on file  Food Insecurity:   . Worried About Charity fundraiser in the Last Year: Not on file  . Ran Out of Food in the Last Year: Not on file  Transportation Needs:   . Lack of Transportation (Medical): Not on file  . Lack of Transportation (Non-Medical): Not on file  Physical Activity:   . Days of Exercise per Week: Not on file  . Minutes of Exercise per Session: Not on file  Stress:   . Feeling of Stress : Not on file  Social Connections:   . Frequency of Communication with Friends and Family: Not on file  . Frequency of Social Gatherings with Friends and Family: Not on file  . Attends Religious Services: Not on file  . Active Member of Clubs or Organizations: Not on file  . Attends Archivist Meetings: Not on file  . Marital Status: Not on file   Allergies  Allergen Reactions  . Levaquin [Levofloxacin In D5w] Shortness Of Breath  . Crestor [Rosuvastatin Calcium] Diarrhea   Family History  Problem Relation  Age of Onset  . Lung cancer Father   . Heart disease Mother        died in her 62's  . Diabetes Daughter      Current Outpatient Medications (Cardiovascular):  .  losartan (COZAAR) 50 MG tablet, TAKE 1 TABLET (50 MG TOTAL) BY MOUTH DAILY.  Current Outpatient Medications (Respiratory):  .  cetirizine (ZYRTEC) 10 MG tablet, TAKE 1 TABLET BY MOUTH EVERY DAY .  fluticasone (FLONASE) 50 MCG/ACT nasal spray, SPRAY 2 SPRAYS INTO EACH NOSTRIL EVERY DAY .  promethazine (PHENERGAN) 25 MG tablet, Take 1 tablet (25 mg total) by mouth every 8 (eight) hours as needed for nausea or vomiting.  Current Outpatient Medications (Analgesics):  .  diclofenac (VOLTAREN) 75 MG EC tablet, TAKE 1 TABLET BY MOUTH TWICE A DAY WITH FOOD AS NEEDED .  traMADol  (ULTRAM) 50 MG tablet, Take 1 tablet (50 mg total) by mouth every 8 (eight) hours as needed for severe pain.   Current Outpatient Medications (Other):  .  diphenoxylate-atropine (LOMOTIL) 2.5-0.025 MG tablet, Take 2 tablets by mouth 4 (four) times daily as needed for diarrhea or loose stools. Marland Kitchen  tiZANidine (ZANAFLEX) 4 MG tablet, Take 1 tablet (4 mg total) by mouth every 6 (six) hours as needed for muscle spasms. .  Vitamin D, Ergocalciferol, (DRISDOL) 1.25 MG (50000 UNIT) CAPS capsule, TAKE 1 CAPSULE (50,000 UNITS TOTAL) BY MOUTH EVERY 7 (SEVEN) DAYS.   Reviewed prior external information including notes and imaging from  primary care provider As well as notes that were available from care everywhere and other healthcare systems.  Past medical history, social, surgical and family history all reviewed in electronic medical record.  [name] pertanent information unless stated regarding to the chief complaint.   Review of Systems:  [name] headache, visual changes, nausea, vomiting, diarrhea, constipation, dizziness, abdominal pain, skin rash, fevers, chills, night sweats, weight loss, swollen lymph nodes, body aches, joint swelling, chest pain, shortness of breath, mood changes. POSITIVE muscle aches  Objective  Blood pressure 120/80, pulse 72, height 6' (1.829 m), weight 223 lb (101.2 kg), SpO2 99 %.   General: [name] apparent distress alert and oriented x3 mood and affect normal, dressed appropriately.  HEENT: Pupils equal, extraocular movements intact  Respiratory: Patient's speak in full sentences and does not appear short of breath  Cardiovascular: [name] lower extremity edema, non tender, [name] erythema  Neuro: Cranial nerves II through XII are intact, neurovascularly intact in all extremities with 2+ DTRs and 2+ pulses.  Gait antalgic  MSK:   Knee: bilateral  valgus deformity noted. Large thigh to calf ratio.  Tender to palpation over medial and PF joint line.  Pain in left hamstring  instability  with valgus force.  painful patellar compression. Left has mild swelling  Patellar glide with moderate crepitus. Patellar and quadriceps tendons unremarkable. Hamstring and quadriceps strength is normal.   Limited musculoskeletal ultrasound was performed and interpreted by Lyndal Pulley  Limited ultrasound of the left knee shows the patient does have an effusion noted with some mild synovitis of the knee.  Patient's medial aspect of the knee difficult to assess, Baker's cyst versus likely a tear of the hamstring tendon when you go more proximal.  Hypoechoic changes.  Some mild loss of the tension of the hamstring tendons noted.   Impression and Recommendations:     The above documentation has been reviewed and is accurate and complete Lyndal Pulley, DO       Note: This  dictation was prepared with Dragon dictation along with smaller phrase technology. Any transcriptional errors that result from this process are unintentional.

## 2019-10-25 ENCOUNTER — Ambulatory Visit (INDEPENDENT_AMBULATORY_CARE_PROVIDER_SITE_OTHER): Payer: PPO | Admitting: Family Medicine

## 2019-10-25 ENCOUNTER — Encounter: Payer: Self-pay | Admitting: Family Medicine

## 2019-10-25 ENCOUNTER — Ambulatory Visit: Payer: Self-pay

## 2019-10-25 ENCOUNTER — Other Ambulatory Visit: Payer: Self-pay

## 2019-10-25 VITALS — BP 112/72 | HR 74 | Ht 72.0 in | Wt 223.0 lb

## 2019-10-25 DIAGNOSIS — G8929 Other chronic pain: Secondary | ICD-10-CM

## 2019-10-25 DIAGNOSIS — S76302A Unspecified injury of muscle, fascia and tendon of the posterior muscle group at thigh level, left thigh, initial encounter: Secondary | ICD-10-CM | POA: Diagnosis not present

## 2019-10-25 DIAGNOSIS — M170 Bilateral primary osteoarthritis of knee: Secondary | ICD-10-CM

## 2019-10-25 DIAGNOSIS — M25562 Pain in left knee: Secondary | ICD-10-CM

## 2019-10-25 NOTE — Progress Notes (Signed)
[place] [name] Phone: [phone] Subjective:   Michael [name], am serving as a scribe for Dr. Hulan Saas. This visit occurred during the SARS-CoV-2 public health emergency.  Safety protocols were in place, including screening questions prior to the visit, additional usage of staff PPE, and extensive cleaning of exam room while observing appropriate contact time as indicated for disinfecting solutions.  I'm seeing this patient by the request  of:  Michael Frizzle, MD  CC: Hamstring and knee pain follow-up  [id number]   8/23/20021 Patient has more of a hamstring injury.  Discussed with patient in great length about icing regimen, home thigh compression, work with athletic trainer to learn home exercises in greater detail, increase activity slowly.  Follow-up with me again in 4 to 8 weeks.  Update 10/25/2019 Michael [name] is a 69 y.o. male coming in with complaint of bilateral knee pain and left hamstring pain. Patient states that his hamstring pain has improved. [name] motions are bothersome to the hamstring.  Patient states that the knee does give him some discomfort from time to time and a sharp pain at night but otherwise still doing significantly better.  Feels the viscosupplementation has been very beneficial.        Past Medical History:  Diagnosis Date  . Adenomatous colon polyp 2004  . Allergy   . Diverticulosis 2011  . Former smoker   . HLD (hyperlipidemia)   . Hyperplastic colon polyp 2004  . Hypertension   . Melanoma in situ [facility])    Past Surgical History:  Procedure Laterality Date  . COLONOSCOPY  2011   SEVERAL    Social History   Socioeconomic History  . Marital status: Married    Spouse name: Not on file  . Number of children: 2  . Years of education: Not on file  . Highest education level: Not on file  Occupational History  . Occupation: part Scientist, clinical (histocompatibility and immunogenetics): [employer]  Tobacco  Use  . Smoking status: Former Smoker    Packs/day: 1.00    Years: 30.00    Pack years: 30.00    Types: Cigarettes, Cigars    Quit date: 05/13/1998    Years since quitting: 21.4  . Smokeless tobacco: Current User    Types: Chew  Substance and Sexual Activity  . Alcohol use: Yes    Comment: freq  . Drug use: [name]  . Sexual activity: Not on file  Other Topics Concern  . Not on file  Social History Narrative  . Not on file   Social Determinants of Health   Financial Resource Strain:   . Difficulty of Paying Living Expenses: Not on file  Food Insecurity:   . Worried About Charity fundraiser in the Last Year: Not on file  . Ran Out of Food in the Last Year: Not on file  Transportation Needs:   . Lack of Transportation (Medical): Not on file  . Lack of Transportation (Non-Medical): Not on file  Physical Activity:   . Days of Exercise per Week: Not on file  . Minutes of Exercise per Session: Not on file  Stress:   . Feeling of Stress : Not on file  Social Connections:   . Frequency of Communication with Friends and Family: Not on file  . Frequency of Social Gatherings with Friends and Family: Not on file  . Attends Religious Services: Not on file  . Active Member  of Clubs or Organizations: Not on file  . Attends Archivist Meetings: Not on file  . Marital Status: Not on file   Allergies  Allergen Reactions  . Levaquin [Levofloxacin In D5w] Shortness Of Breath  . Crestor [Rosuvastatin Calcium] Diarrhea   Family History  Problem Relation Age of Onset  . Lung cancer Father   . Heart disease Mother        died in her 75's  . Diabetes Daughter      Current Outpatient Medications (Cardiovascular):  .  losartan (COZAAR) 50 MG tablet, TAKE 1 TABLET (50 MG TOTAL) BY MOUTH DAILY.  Current Outpatient Medications (Respiratory):  .  cetirizine (ZYRTEC) 10 MG tablet, TAKE 1 TABLET BY MOUTH EVERY DAY .  fluticasone (FLONASE) 50 MCG/ACT nasal spray, SPRAY 2 SPRAYS INTO  EACH NOSTRIL EVERY DAY .  promethazine (PHENERGAN) 25 MG tablet, Take 1 tablet (25 mg total) by mouth every 8 (eight) hours as needed for nausea or vomiting.  Current Outpatient Medications (Analgesics):  .  diclofenac (VOLTAREN) 75 MG EC tablet, TAKE 1 TABLET BY MOUTH TWICE A DAY WITH FOOD AS NEEDED .  traMADol (ULTRAM) 50 MG tablet, Take 1 tablet (50 mg total) by mouth every 8 (eight) hours as needed for severe pain.   Current Outpatient Medications (Other):  .  diphenoxylate-atropine (LOMOTIL) 2.5-0.025 MG tablet, Take 2 tablets by mouth 4 (four) times daily as needed for diarrhea or loose stools. Marland Kitchen  tiZANidine (ZANAFLEX) 4 MG tablet, Take 1 tablet (4 mg total) by mouth every 6 (six) hours as needed for muscle spasms. .  Vitamin D, Ergocalciferol, (DRISDOL) 1.25 MG (50000 UNIT) CAPS capsule, TAKE 1 CAPSULE (50,000 UNITS TOTAL) BY MOUTH EVERY 7 (SEVEN) DAYS.   Reviewed prior external information including notes and imaging from  primary care provider As well as notes that were available from care everywhere and other healthcare systems.  Past medical history, social, surgical and family history all reviewed in electronic medical record.  [name] pertanent information unless stated regarding to the chief complaint.   Review of Systems:  [name] headache, visual changes, nausea, vomiting, diarrhea, constipation, dizziness, abdominal pain, skin rash, fevers, chills, night sweats, weight loss, swollen lymph nodes, body aches, joint swelling, chest pain, shortness of breath, mood changes. POSITIVE muscle aches  Objective  Blood pressure 112/72, pulse 74, height 6' (1.829 m), weight 223 lb (101.2 kg), SpO2 96 %.   General: [name] apparent distress alert and oriented x3 mood and affect normal, dressed appropriately.  HEENT: Pupils equal, extraocular movements intact  Respiratory: Patient's speak in full sentences and does not appear short of breath  Cardiovascular: [name] lower extremity edema, non tender, [name]  erythema  Neuro: Cranial nerves II through XII are intact, neurovascularly intact in all extremities with 2+ DTRs and 2+ pulses.  Gait normal with good balance and coordination.  MSK:   With testing patient's hamstring [name] pain with significant amount of stressing with strength.  Patient is able to even balance on the leg fairly well.  Knee exams bilaterally though do have the arthritic changes with very mild instability with valgus and varus noted.  Mild crepitus with range of motion and very minimal pain over the medial joint line.    Impression and Recommendations:     The above documentation has been reviewed and is accurate and complete Lyndal Pulley, DO

## 2019-10-25 NOTE — Patient Instructions (Signed)
Glad you are doing well Turmeric 500mg  daily  Tart cherry extract 1200mg  at night Vitamin D 2000 IU daily  See me again in 2 months

## 2019-10-25 NOTE — Assessment & Plan Note (Signed)
Stable at the moment.  Seems to be doing well.  Did do well with the [place] previously.  We discussed with patient about the icing regimen, home exercise, patient will follow up again in 2 months for further evaluation and treatment.

## 2019-10-25 NOTE — Assessment & Plan Note (Signed)
Completely resolved at this time. 

## 2019-11-12 ENCOUNTER — Other Ambulatory Visit: Payer: Self-pay | Admitting: Family Medicine

## 2019-11-16 ENCOUNTER — Other Ambulatory Visit: Payer: Self-pay

## 2019-11-16 ENCOUNTER — Ambulatory Visit (INDEPENDENT_AMBULATORY_CARE_PROVIDER_SITE_OTHER): Payer: PPO | Admitting: Family Medicine

## 2019-11-16 VITALS — BP 140/90 | HR 74 | Temp 97.7°F | Ht 73.0 in | Wt 224.0 lb

## 2019-11-16 DIAGNOSIS — M170 Bilateral primary osteoarthritis of knee: Secondary | ICD-10-CM

## 2019-11-16 DIAGNOSIS — Z85820 Personal history of malignant melanoma of skin: Secondary | ICD-10-CM | POA: Diagnosis not present

## 2019-11-16 DIAGNOSIS — I10 Essential (primary) hypertension: Secondary | ICD-10-CM | POA: Diagnosis not present

## 2019-11-16 DIAGNOSIS — Z125 Encounter for screening for malignant neoplasm of prostate: Secondary | ICD-10-CM

## 2019-11-16 DIAGNOSIS — E785 Hyperlipidemia, unspecified: Secondary | ICD-10-CM | POA: Diagnosis not present

## 2019-11-16 MED ORDER — TAMSULOSIN HCL 0.4 MG PO CAPS: 0.4000 mg | ORAL_CAPSULE | Freq: Every day | ORAL | 11 refills | Status: DC

## 2019-11-16 NOTE — Progress Notes (Signed)
Subjective:    Patient ID: [name], male    DOB: 01-Nov-1950, 69 y.o.   MRN: [id number]  HPI  Patient is here today for a complete physical exam.  Patient does report lower urinary tract symptoms.  He states that he wakes up 1-2 times every night to have to urinate.  He will feel the sudden urge to urinate but when he goes to the restroom he has a weak dribbling stream.  He reports increased frequency and urgency but also urinary hesitancy.  On rectal exam today the prostate is enlarged but it is nontender and non-nodular.  Last colonoscopy was in 2011.  This is due this year.  He defers this until next year.  He is due for Pneumovax 23 today.  He is already had his flu shot.  He has had his Covid shot.  He is due for the shingles vaccine but I recommended deferring that until the spring.  He denies any falls, depression, or memory loss Immunization History  Administered Date(s) Administered  . Influenza,inj,Quad PF,6+ Mos 12/16/2012, 11/03/2014  . Influenza-Unspecified 10/21/2016  . Pneumococcal Conjugate-13 07/01/2014  . Pneumococcal Polysaccharide-23 06/19/2012  . Td 06/12/2001  . Tdap 05/18/2013   Past Medical History:  Diagnosis Date  . Adenomatous colon polyp 2004  . Allergy   . Diverticulosis 2011  . Former smoker   . HLD (hyperlipidemia)   . Hyperplastic colon polyp 2004  . Hypertension   . Melanoma in situ [facility])    Past Surgical History:  Procedure Laterality Date  . COLONOSCOPY  2011   SEVERAL    Current Outpatient Medications on File Prior to Visit  Medication Sig Dispense Refill  . cetirizine (ZYRTEC) 10 MG tablet TAKE 1 TABLET BY MOUTH EVERY DAY 90 tablet 3  . diclofenac (VOLTAREN) 75 MG EC tablet TAKE 1 TABLET BY MOUTH TWICE A DAY WITH FOOD AS NEEDED 180 tablet 0  . diphenoxylate-atropine (LOMOTIL) 2.5-0.025 MG tablet Take 2 tablets by mouth 4 (four) times daily as needed for diarrhea or loose stools. 30 tablet 0  . fluticasone (FLONASE) 50 MCG/ACT nasal  spray SPRAY 2 SPRAYS INTO EACH NOSTRIL EVERY DAY 48 mL 0  . losartan (COZAAR) 50 MG tablet TAKE 1 TABLET (50 MG TOTAL) BY MOUTH DAILY. 90 tablet 3  . promethazine (PHENERGAN) 25 MG tablet Take 1 tablet (25 mg total) by mouth every 8 (eight) hours as needed for nausea or vomiting. 30 tablet 0  . tiZANidine (ZANAFLEX) 4 MG tablet Take 1 tablet (4 mg total) by mouth every 6 (six) hours as needed for muscle spasms. 30 tablet 1  . traMADol (ULTRAM) 50 MG tablet Take 1 tablet (50 mg total) by mouth every 8 (eight) hours as needed for severe pain. 15 tablet 0  . Vitamin D, Ergocalciferol, (DRISDOL) 1.25 MG (50000 UNIT) CAPS capsule TAKE 1 CAPSULE (50,000 UNITS TOTAL) BY MOUTH EVERY 7 (SEVEN) DAYS. 12 capsule 0   No current facility-administered medications on file prior to visit.   Allergies  Allergen Reactions  . Levaquin [Levofloxacin In D5w] Shortness Of Breath  . Crestor [Rosuvastatin Calcium] Diarrhea   Social History   Socioeconomic History  . Marital status: Married    Spouse name: Not on file  . Number of children: 2  . Years of education: Not on file  . Highest education level: Not on file  Occupational History  . Occupation: part Scientist, clinical (histocompatibility and immunogenetics): [employer]  Tobacco Use  . Smoking status: Former  Smoker    Packs/day: 1.00    Years: 30.00    Pack years: 30.00    Types: Cigarettes, Cigars    Quit date: 05/13/1998    Years since quitting: 21.5  . Smokeless tobacco: Current User    Types: Chew  Substance and Sexual Activity  . Alcohol use: Yes    Comment: freq  . Drug use: No  . Sexual activity: Not on file  Other Topics Concern  . Not on file  Social History Narrative  . Not on file   Social Determinants of Health   Financial Resource Strain:   . Difficulty of Paying Living Expenses: Not on file  Food Insecurity:   . Worried About Charity fundraiser in the Last Year: Not on file  . Ran Out of Food in the Last Year: Not on file  Transportation Needs:   . Lack of  Transportation (Medical): Not on file  . Lack of Transportation (Non-Medical): Not on file  Physical Activity:   . Days of Exercise per Week: Not on file  . Minutes of Exercise per Session: Not on file  Stress:   . Feeling of Stress : Not on file  Social Connections:   . Frequency of Communication with Friends and Family: Not on file  . Frequency of Social Gatherings with Friends and Family: Not on file  . Attends Religious Services: Not on file  . Active Member of Clubs or Organizations: Not on file  . Attends Archivist Meetings: Not on file  . Marital Status: Not on file  Intimate Partner Violence:   . Fear of Current or Ex-Partner: Not on file  . Emotionally Abused: Not on file  . Physically Abused: Not on file  . Sexually Abused: Not on file   Family History  Problem Relation Age of Onset  . Lung cancer Father   . Heart disease Mother        died in her 24's  . Diabetes Daughter      Review of Systems  All other systems reviewed and are negative.      Objective:   Physical Exam Vitals reviewed.  Constitutional:      General: He is not in acute distress.    Appearance: He is well-developed. He is not diaphoretic.  HENT:     Head: Normocephalic and atraumatic.     Right Ear: External ear normal.     Left Ear: External ear normal.     Nose: Nose normal.     Mouth/Throat:     Pharynx: No oropharyngeal exudate.  Eyes:     General: No scleral icterus.       Right eye: No discharge.        Left eye: No discharge.     Conjunctiva/sclera: Conjunctivae normal.     Pupils: Pupils are equal, round, and reactive to light.  Neck:     Thyroid: No thyromegaly.     Vascular: No JVD.     Trachea: No tracheal deviation.  Cardiovascular:     Rate and Rhythm: Normal rate and regular rhythm.     Heart sounds: Normal heart sounds. No murmur heard.  No friction rub. No gallop.   Pulmonary:     Effort: Pulmonary effort is normal. No respiratory distress.      Breath sounds: Normal breath sounds. No stridor. No wheezing or rales.  Chest:     Chest wall: No tenderness.  Abdominal:     General: Bowel sounds  are normal. There is no distension.     Palpations: Abdomen is soft. There is no mass.     Tenderness: There is no abdominal tenderness. There is no guarding or rebound.  Genitourinary:    Penis: Normal. No tenderness.      Prostate: Enlarged.     Rectum: Normal. Guaiac result negative.  Musculoskeletal:        General: No tenderness or deformity. Normal range of motion.     Cervical back: Normal range of motion and neck supple.  Lymphadenopathy:     Cervical: No cervical adenopathy.  Skin:    General: Skin is warm.     Coloration: Skin is not pale.     Findings: No erythema or rash.  Neurological:     Mental Status: He is alert and oriented to person, place, and time.     Cranial Nerves: No cranial nerve deficit.     Motor: No abnormal muscle tone.     Coordination: Coordination normal.     Deep Tendon Reflexes: Reflexes are normal and symmetric.  Psychiatric:        Behavior: Behavior normal.        Thought Content: Thought content normal.        Judgment: Judgment normal.           Assessment & Plan:  Benign essential HTN - Plan: CBC with Differential/Platelet, COMPLETE METABOLIC PANEL WITH GFR, Lipid panel  Dyslipidemia  Primary osteoarthritis of both knees  History of melanoma  Prostate cancer screening - Plan: PSA  Physical exam is consistent with BPH.  I recommended trying Flomax 0.4 mg p.o. nightly to see if this will help with the lower urinary tract symptoms.  It may also even help his blood pressure.  Reassess via telephone in 3 to 4 weeks.  Meanwhile check CBC, CMP, fasting lipid panel.  Screen for prostate cancer with a PSA.  Recommended a colonoscopy but he defers this at the present time.  He states that he will call me when he is ready for me to schedule this.  He received Pneumovax 23 today.  His flu shots  up-to-date.  I recommended a booster on his Covid vaccine next month.  Also recommended the shingles shot but I will defer that until this coming spring.  He denies any falls, depression, or memory loss.

## 2019-11-17 DIAGNOSIS — H16133 Photokeratitis, bilateral: Secondary | ICD-10-CM | POA: Diagnosis not present

## 2019-11-17 LAB — CBC WITH DIFFERENTIAL/PLATELET
Absolute Monocytes: 827 cells/uL (ref 200–950)
Basophils Absolute: 56 cells/uL (ref 0–200)
Basophils Relative: 0.6 %
Eosinophils Absolute: 649 cells/uL — ABNORMAL HIGH (ref 15–500)
Eosinophils Relative: 6.9 %
HCT: 43.5 % (ref 38.5–50.0)
Hemoglobin: 14.5 g/dL (ref 13.2–17.1)
Lymphs Abs: 2839 cells/uL (ref 850–3900)
MCH: 28.8 pg (ref 27.0–33.0)
MCHC: 33.3 g/dL (ref 32.0–36.0)
MCV: 86.3 fL (ref 80.0–100.0)
MPV: 10.7 fL (ref 7.5–12.5)
Monocytes Relative: 8.8 %
Neutro Abs: 5029 cells/uL (ref 1500–7800)
Neutrophils Relative %: 53.5 %
Platelets: 326 10*3/uL (ref 140–400)
RBC: 5.04 10*6/uL (ref 4.20–5.80)
RDW: 12.5 % (ref 11.0–15.0)
Total Lymphocyte: 30.2 %
WBC: 9.4 10*3/uL (ref 3.8–10.8)

## 2019-11-17 LAB — LIPID PANEL
Cholesterol: 227 mg/dL — ABNORMAL HIGH (ref <200)
HDL: 31 mg/dL — ABNORMAL LOW (ref >=40)
LDL Cholesterol (Calc): 155 mg/dL (calc) — ABNORMAL HIGH
Non-HDL Cholesterol (Calc): 196 mg/dL (calc) — ABNORMAL HIGH (ref <130)
Total CHOL/HDL Ratio: 7.3 (calc) — ABNORMAL HIGH (ref <5.0)
Triglycerides: 240 mg/dL — ABNORMAL HIGH (ref <150)

## 2019-11-17 LAB — COMPLETE METABOLIC PANEL WITH GFR
AG Ratio: 1.8 (calc) (ref 1.0–2.5)
ALT: 30 U/L (ref 9–46)
AST: 27 U/L (ref 10–35)
Albumin: 4.3 g/dL (ref 3.6–5.1)
Alkaline phosphatase (APISO): 83 U/L (ref 35–144)
BUN/Creatinine Ratio: 17 (calc) (ref 6–22)
BUN: 24 mg/dL (ref 7–25)
CO2: 25 mmol/L (ref 20–32)
Calcium: 9.5 mg/dL (ref 8.6–10.3)
Chloride: 104 mmol/L (ref 98–110)
Creat: 1.38 mg/dL — ABNORMAL HIGH (ref 0.70–1.18)
GFR, Est African American: 60 mL/min/{1.73_m2} (ref >=60)
GFR, Est Non African American: 51 mL/min/{1.73_m2} — ABNORMAL LOW (ref >=60)
Globulin: 2.4 g/dL (calc) (ref 1.9–3.7)
Glucose, Bld: 88 mg/dL (ref 65–99)
Potassium: 4.5 mmol/L (ref 3.5–5.3)
Sodium: 137 mmol/L (ref 135–146)
Total Bilirubin: 0.6 mg/dL (ref 0.2–1.2)
Total Protein: 6.7 g/dL (ref 6.1–8.1)

## 2019-11-17 LAB — PSA: PSA: 1.13 ng/mL (ref <=4.0)

## 2019-11-18 ENCOUNTER — Other Ambulatory Visit: Payer: Self-pay

## 2019-11-18 DIAGNOSIS — E7800 Pure hypercholesterolemia, unspecified: Secondary | ICD-10-CM

## 2019-11-18 MED ORDER — ATORVASTATIN CALCIUM 40 MG PO TABS: 40.0000 mg | ORAL_TABLET | Freq: Every day | ORAL | 3 refills | Status: DC

## 2019-11-19 DIAGNOSIS — H16133 Photokeratitis, bilateral: Secondary | ICD-10-CM | POA: Diagnosis not present

## 2019-11-29 NOTE — Progress Notes (Signed)
[place] [address] [place] Phone: [phone] Subjective:   I [name] am serving as a Education administrator for Dr. Hulan Saas.  This visit occurred during the SARS-CoV-2 public health emergency.  Safety protocols were in place, including screening questions prior to the visit, additional usage of staff PPE, and extensive cleaning of exam room while observing appropriate contact time as indicated for disinfecting solutions.   This visit occurred during the SARS-CoV-2 public health emergency.  Safety protocols were in place, including screening questions prior to the visit, additional usage of staff PPE, and extensive cleaning of exam room while observing appropriate contact time as indicated for disinfecting solutions.   I'm seeing this patient by the request  of:  Susy Frizzle, MD  CC: Knee pain follow-up  EUM:[id number] 10/25/2019 Stable at the moment.  Seems to be doing well.  Did do well with the [place] previously.  We discussed with patient about the icing regimen, home exercise, patient will follow up again in 2 months for further evaluation and treatment.  Update 11/30/2019 [name] is a 69 y.o. male coming in with complaint of bilateral knee pain. Patient states he is in pain today. States that he was taken off his pain meds. States arthritis in multiple joints today is causing pain.        Past Medical History:  Diagnosis Date  . Adenomatous colon polyp 2004  . Allergy   . Diverticulosis 2011  . Former smoker   . HLD (hyperlipidemia)   . Hyperplastic colon polyp 2004  . Hypertension   . Melanoma in situ [facility])    Past Surgical History:  Procedure Laterality Date  . COLONOSCOPY  2011   SEVERAL    Social History   Socioeconomic History  . Marital status: Married    Spouse name: Not on file  . Number of children: 2  . Years of education: Not on file  . Highest education level: Not on file  Occupational  History  . Occupation: part Scientist, clinical (histocompatibility and immunogenetics): [employer]  Tobacco Use  . Smoking status: Former Smoker    Packs/day: 1.00    Years: 30.00    Pack years: 30.00    Types: Cigarettes, Cigars    Quit date: 05/13/1998    Years since quitting: 21.5  . Smokeless tobacco: Current User    Types: Chew  Substance and Sexual Activity  . Alcohol use: Yes    Comment: freq  . Drug use: No  . Sexual activity: Not on file  Other Topics Concern  . Not on file  Social History Narrative  . Not on file   Social Determinants of Health   Financial Resource Strain:   . Difficulty of Paying Living Expenses: Not on file  Food Insecurity:   . Worried About Charity fundraiser in the Last Year: Not on file  . Ran Out of Food in the Last Year: Not on file  Transportation Needs:   . Lack of Transportation (Medical): Not on file  . Lack of Transportation (Non-Medical): Not on file  Physical Activity:   . Days of Exercise per Week: Not on file  . Minutes of Exercise per Session: Not on file  Stress:   . Feeling of Stress : Not on file  Social Connections:   . Frequency of Communication with Friends and Family: Not on file  . Frequency of Social Gatherings with Friends and Family: Not on file  .  Attends Religious Services: Not on file  . Active Member of Clubs or Organizations: Not on file  . Attends Archivist Meetings: Not on file  . Marital Status: Not on file   Allergies  Allergen Reactions  . Levaquin [Levofloxacin In D5w] Shortness Of Breath  . Crestor [Rosuvastatin Calcium] Diarrhea   Family History  Problem Relation Age of Onset  . Lung cancer Father   . Heart disease Mother        died in her 72's  . Diabetes Daughter      Current Outpatient Medications (Cardiovascular):  .  atorvastatin (LIPITOR) 40 MG tablet, Take 1 tablet (40 mg total) by mouth daily. Marland Kitchen  losartan (COZAAR) 50 MG tablet, TAKE 1 TABLET (50 MG TOTAL) BY MOUTH DAILY.  Current Outpatient Medications  (Respiratory):  .  cetirizine (ZYRTEC) 10 MG tablet, TAKE 1 TABLET BY MOUTH EVERY DAY .  fluticasone (FLONASE) 50 MCG/ACT nasal spray, SPRAY 2 SPRAYS INTO EACH NOSTRIL EVERY DAY .  promethazine (PHENERGAN) 25 MG tablet, Take 1 tablet (25 mg total) by mouth every 8 (eight) hours as needed for nausea or vomiting.  Current Outpatient Medications (Analgesics):  .  traMADol (ULTRAM) 50 MG tablet, Take 1 tablet (50 mg total) by mouth every 8 (eight) hours as needed for severe pain.   Current Outpatient Medications (Other):  .  diphenoxylate-atropine (LOMOTIL) 2.5-0.025 MG tablet, Take 2 tablets by mouth 4 (four) times daily as needed for diarrhea or loose stools. .  tamsulosin (FLOMAX) 0.4 MG CAPS capsule, Take 1 capsule (0.4 mg total) by mouth daily. Marland Kitchen  tiZANidine (ZANAFLEX) 4 MG tablet, Take 1 tablet (4 mg total) by mouth every 6 (six) hours as needed for muscle spasms. .  Vitamin D, Ergocalciferol, (DRISDOL) 1.25 MG (50000 UNIT) CAPS capsule, TAKE 1 CAPSULE (50,000 UNITS TOTAL) BY MOUTH EVERY 7 (SEVEN) DAYS.   Reviewed prior external information including notes and imaging from  primary care provider As well as notes that were available from care everywhere and other healthcare systems.  Past medical history, social, surgical and family history all reviewed in electronic medical record.  No pertanent information unless stated regarding to the chief complaint.   Review of Systems:  No headache, visual changes, nausea, vomiting, diarrhea, constipation, dizziness, abdominal pain, skin rash, fevers, chills, night sweats, weight loss, swollen lymph nodes,chest pain, shortness of breath, mood changes. POSITIVE muscle aches, body aches and joint swelling  Objective  Blood pressure 130/70, pulse 84, height 6\' 1"  (1.854 m), weight 222 lb (100.7 kg), SpO2 96 %.   General: No apparent distress alert and oriented x3 mood and affect normal, dressed appropriately.  HEENT: Pupils equal, extraocular  movements intact  Respiratory: Patient's speak in full sentences and does not appear short of breath  Cardiovascular: No lower extremity edema, non tender, no erythema  Antalgic gait  Knee:bilateral  valgus deformity noted. abnormal thigh to calf ratio.  Tender to palpation over medial and PF joint line.  ROM full in flexion and extension and lower leg rotation. instability with valgus force.  painful patellar compression. Patellar glide with moderate crepitus. Patellar and quadriceps tendons unremarkable. Hamstring and quadriceps strength is normal.  After informed written and verbal consent, patient was seated on exam table. Right knee was prepped with alcohol swab and utilizing anterolateral approach, patient's right knee space was injected with 4:1  marcaine 0.5%: Kenalog 40mg /dL. Patient tolerated the procedure well without immediate complications.  After informed written and verbal consent, patient was seated  on exam table. Left knee was prepped with alcohol swab and utilizing anterolateral approach, patient's left knee space was injected with 4:1  marcaine 0.5%: Kenalog 40mg /dL. Patient tolerated the procedure well without immediate complications.    Impression and Recommendations:     The above documentation has been reviewed and is accurate and complete Lyndal Pulley, DO

## 2019-11-30 ENCOUNTER — Other Ambulatory Visit: Payer: Self-pay

## 2019-11-30 ENCOUNTER — Ambulatory Visit (INDEPENDENT_AMBULATORY_CARE_PROVIDER_SITE_OTHER): Payer: PPO

## 2019-11-30 ENCOUNTER — Ambulatory Visit: Payer: PPO | Admitting: Family Medicine

## 2019-11-30 ENCOUNTER — Encounter: Payer: Self-pay | Admitting: Family Medicine

## 2019-11-30 DIAGNOSIS — M170 Bilateral primary osteoarthritis of knee: Secondary | ICD-10-CM | POA: Diagnosis not present

## 2019-11-30 DIAGNOSIS — Z23 Encounter for immunization: Secondary | ICD-10-CM

## 2019-11-30 NOTE — Patient Instructions (Signed)
Good to see you Knee injections today We will get you approved for gel See me as scheduled

## 2019-11-30 NOTE — Assessment & Plan Note (Signed)
Patient given injection today and tolerated the procedure well.  Patient does have severe bone-on-bone osteoarthritic changes of the medial compartment bilaterally.  Patient wants to hold on any type of surgical intervention.  He did do very well with the viscosupplementation last injections were in December 2020.  Patient will follow up again in 4 weeks and we will consider repeating that if we get prior approval.  Patient knows if the viscosupplementation does not last 6 months then we will consider surgical replacement

## 2019-12-03 ENCOUNTER — Telehealth: Payer: Self-pay | Admitting: Family Medicine

## 2019-12-03 NOTE — Progress Notes (Signed)
  Chronic Care Management   Note  12/03/2019 Name: [name] MRN: [id number] DOB: December 24, 1950  [name] is a 69 y.o. year old male who is a primary care patient of Dennard Schaumann, Cammie Mcgee, MD. I reached out to [name] by phone today in response to a referral sent by [name]'s PCP, Susy Frizzle, MD.   [name] was given information about Chronic Care Management services today including:  1. CCM service includes personalized support from designated clinical staff supervised by his physician, including individualized plan of care and coordination with other care providers 2. 24/7 contact phone numbers for assistance for urgent and routine care needs. 3. Service will only be billed when office clinical staff spend 20 minutes or more in a month to coordinate care. 4. Only one practitioner may furnish and bill the service in a calendar month. 5. The patient may stop CCM services at any time (effective at the end of the month) by phone call to the office staff.   Patient agreed to services and verbal consent obtained.   Follow up plan:   Carley Perdue [employer] Scheduler

## 2019-12-06 NOTE — Chronic Care Management (AMB) (Addendum)
Chronic Care Management Pharmacy  Name: [name]  MRN: [id number] DOB: 05/20/1950  Chief Complaint/ HPI  [name],  69 y.o. , male presents for their Initial CCM visit with the clinical pharmacist via telephone.  PCP : Susy Frizzle, MD  Their chronic conditions include: HTN, Allergic Rhinitis, HLD, Degenerative arthritis of the knee.  Office Visits: 11/16/2019 Dennard Schaumann) - reports lower urinary tract symptoms, PCP concluded BPH and the patient was started on Flomax 0.39m nightly to see if this helps with night time symptoms.  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    Medications: Outpatient Encounter Medications as of 12/08/2019  Medication Sig   atorvastatin (LIPITOR) 40 MG tablet Take 1 tablet (40 mg total) by mouth daily.   cetirizine (ZYRTEC) 10 MG tablet TAKE 1 TABLET BY MOUTH EVERY DAY   losartan (COZAAR) 50 MG tablet TAKE 1 TABLET (50 MG TOTAL) BY MOUTH DAILY.   Misc Natural Products (TART CHERRY ADVANCED PO) Take 1,000 mg by mouth daily.   tamsulosin (FLOMAX) 0.4 MG CAPS capsule Take 1 capsule (0.4 mg total) by mouth daily.   tiZANidine (ZANAFLEX) 4 MG tablet Take 1 tablet (4 mg total) by mouth every 6 (six) hours as needed for muscle spasms.   traMADol (ULTRAM) 50 MG tablet Take 1 tablet (50 mg total) by mouth every 8 (eight) hours as needed for severe pain.   Vitamin D, Ergocalciferol, (DRISDOL) 1.25 MG (50000 UNIT) CAPS capsule TAKE 1 CAPSULE (50,000 UNITS TOTAL) BY MOUTH EVERY 7 (SEVEN) DAYS.   [DISCONTINUED] diphenoxylate-atropine (LOMOTIL) 2.5-0.025 MG tablet Take 2 tablets by mouth 4 (four) times daily as needed for diarrhea or loose stools.   [DISCONTINUED] fluticasone (FLONASE) 50 MCG/ACT nasal spray SPRAY 2 SPRAYS INTO EACH NOSTRIL EVERY DAY (Patient not taking: Reported on 12/08/2019)   [DISCONTINUED] promethazine  (PHENERGAN) 25 MG tablet Take 1 tablet (25 mg total) by mouth every 8 (eight) hours as needed for nausea or vomiting.   No facility-administered encounter medications on file as of 12/08/2019.     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    Difficulty of Paying Living Expenses: Not very hard    Goals Addressed             This Visit's Progress    Pharmacy Care Plan:       CARE PLAN ENTRY (see longitudinal plan of care for additional care plan information)  Current Barriers:  Chronic Disease Management support, education, and care coordination needs related to Hypertension, Hyperlipidemia, and Arthritis   Hypertension BP Readings from Last 3 Encounters:  11/30/19 130/70  11/16/19 140/90  10/25/19 112/72   Pharmacist Clinical Goal(s): Over the next 180 days, patient will work with PharmD and providers to maintain BP goal <140/90 Current regimen:  Losartan 555mdaily Interventions: Reviewed home BP monitoring Discussed med adherence Patient self care activities - Over the next 180 days, patient will: Check BP daily, document, and provide at future appointments Ensure daily salt intake < 2300 mg/day  Hyperlipidemia Lab Results  Component Value Date/Time   LDLCALC 155 (H) 11/16/2019 03:16 PM   Pharmacist Clinical Goal(s): Over the next 180 days, patient will work with PharmD and providers to achieve LDL goal < 100 Current regimen:  Atorvastatin 4040maily Interventions: Reviewed most recent lipid panel Recommended patient work on eliminating fatty and fried  foods from diet Discussed importance of medication adherence and benefits of statin medications Patient self care activities - Over the next 180 days, patient will: Continue to focus on medication adherence Work on dietary modifications limiting amount of cholesterol in diet   Arthritis Pharmacist Clinical Goal(s) Over the next 180 days, patient will work with PharmD and  providers to optimize meds and minimize symptoms of arthritis. Current regimen:  Tramadol 4m Tizanidine 468mTart Cherry 100051mnterventions: Discussed avoidance of NSAIDs Discussed current level of symptoms Patient self care activities - Over the next 180 days, patient will: Continue to take medications as directed Contact providers with any new or worsening symptoms  Initial goal documentation         Hypertension   BP goal is:  <140/90  Office blood pressures are  BP Readings from Last 3 Encounters:  11/30/19 130/70  11/16/19 140/90  10/25/19 112/72   Patient checks BP at home  about every other day Patient home BP readings are ranging: 134/83 average  Patient has failed these meds in the past: none noted Patient is currently controlled on the following medications:  Losartan 35m67mily  We discussed  He takes medication every morning Denies dizziness, headaches. 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  Plan  Continue current medications     Hyperlipidemia   LDL goal < 100  Last lipids Lab Results  Component Value Date   CHOL 227 (H) 11/16/2019   HDL 31 (L) 11/16/2019   LDLCALC 155 (H) 11/16/2019   TRIG 240 (H) 11/16/2019   CHOLHDL 7.3 (H) 11/16/2019   Hepatic Function Latest Ref Rng & Units 11/16/2019 03/20/2017 10/06/2015  Total Protein 6.1 - 8.1 g/dL 6.7 6.6 6.3  Albumin 3.6 - 5.1 g/dL - - 4.0  AST 10 - 35 U/L _0 ALT 9 - 46 U/L _1 Alk Phosphatase 40 - 115 U/L - - 68  Total Bilirubin 0.2 - 1.2 mg/dL 0.6 0.8 0.6     The 10-year ASCVD risk score (GofMikey BussingJr., et al., 2013) is: 27.6%   Values used to calculate the score:     Age: 72 y8rs     Sex: Male     Is Non-Hispanic African American: No     Diabetic: No     Tobacco smoker: No     Systolic Blood Pressure: 130 633g     Is BP treated: Yes     HDL Cholesterol: 31 mg/dL      Total Cholesterol: 227 mg/dL   Patient has failed these meds in past: Crestor (diarrhea) Patient is currently uncontrolled on the following medications:  Atorvastatin 40mg21mrted 10/26  We discussed:   Has been adherent with medication ever since initiated on 10/26. Denies myalgias or any other adverse effect. Diet: Counseled on importance of limiting fatty and fried foods in diet. He reports he only drinks water now with occasional tea at dinner if he goes out to a restaurant which is rare.  Counseled on LDL goals and importance of lowering cholesterol to prevent CV events in the future. Plan  Continue current medications  Focus on med adherence Work on diet mods Follow up plan in place Arthritis of the Knee   Patient has failed these meds in past: none noted Patient is currently controlled on the following medications:  Tramadol 35mg 91mnidine 4mg da25m Tart [place]  1026m daily  We discussed:  He is taking tramadol mainly at night to help him sleep, tizanidine in the morning to help ease the pain Continues to be very active at work, plans to retire within a year or two Discussed avoidance of NSAIDs - he is no longer taking diclofenac and only uses Tylenol OTC  Plan  Continue current medications Allergic rhinitis   Patient has failed these meds in past: none noted Patient is currently controlled on the following medications:  Cetirizine 110m We discussed:   No longer taking Flonase He takes cetirizine in the morning, no issues with daytime drowsiness Reports allergy symptoms will worsen for a week or so during season change, then slowly improve No issues at the moment  Plan  Continue current medications  Vaccines   Reviewed and discussed patient's vaccination history.    Immunization History  Administered Date(s) Administered   Influenza,inj,Quad PF,6+ Mos 12/16/2012, 11/03/2014   Influenza-Unspecified 10/21/2016   Pneumococcal Conjugate-13  07/01/2014   Pneumococcal Polysaccharide-23 06/19/2012, 11/30/2019   Td 06/12/2001   Tdap 05/18/2013    Plan  Recommended patient receive Flu and shingles vaccine. Medication Management   Miscellaneous medications:  Vitamin D 50000u weekly OTC's:  Tylenol Arthritis 65047m bid Patient currently uses [facility].   Patient reports using no specific method to organize medications and promote adherence. Patient denies missed doses of medication.   ChrBeverly MilchharmD Clinical Pharmacist [phone] collaborated with the care management provider regarding care management and care coordination activities outlined in this encounter and have reviewed this encounter including documentation in the note and care plan. I am certifying that I agree with the content of this note and encounter as supervising physician.

## 2019-12-08 ENCOUNTER — Ambulatory Visit: Payer: PPO | Admitting: Pharmacist

## 2019-12-08 DIAGNOSIS — J3089 Other allergic rhinitis: Secondary | ICD-10-CM

## 2019-12-08 DIAGNOSIS — E785 Hyperlipidemia, unspecified: Secondary | ICD-10-CM

## 2019-12-08 DIAGNOSIS — M170 Bilateral primary osteoarthritis of knee: Secondary | ICD-10-CM

## 2019-12-08 DIAGNOSIS — I10 Essential (primary) hypertension: Secondary | ICD-10-CM

## 2019-12-08 NOTE — Patient Instructions (Addendum)
Visit Information Thank you for meeting with me today!  I look forward to working with you to help you meet all of your healthcare goals and answer any questions you may have.  Feel free to contact me anytime!  Goals Addressed            This Visit's Progress   . Pharmacy Care Plan:       CARE PLAN ENTRY (see longitudinal plan of care for additional care plan information)  Current Barriers:  . Chronic Disease Management support, education, and care coordination needs related to Hypertension, Hyperlipidemia, and Arthritis   Hypertension BP Readings from Last 3 Encounters:  11/30/19 130/70  11/16/19 140/90  10/25/19 112/72   . Pharmacist Clinical Goal(s): o Over the next 180 days, patient will work with PharmD and providers to maintain BP goal <140/90 . Current regimen:  o Losartan 50mg  daily . Interventions: o Reviewed home BP monitoring o Discussed med adherence . Patient self care activities - Over the next 180 days, patient will: o Check BP daily, document, and provide at future appointments o Ensure daily salt intake < 2300 mg/day  Hyperlipidemia Lab Results  Component Value Date/Time   LDLCALC 155 (H) 11/16/2019 03:16 PM   . Pharmacist Clinical Goal(s): o Over the next 180 days, patient will work with PharmD and providers to achieve LDL goal < 100 . Current regimen:  o Atorvastatin 40mg  daily . Interventions: o Reviewed most recent lipid panel o Recommended patient work on eliminating fatty and fried foods from diet o Discussed importance of medication adherence and benefits of statin medications . Patient self care activities - Over the next 180 days, patient will: o Continue to focus on medication adherence o Work on dietary modifications limiting amount of cholesterol in diet   Arthritis . Pharmacist Clinical Goal(s) o Over the next 180 days, patient will work with PharmD and providers to optimize meds and minimize symptoms of arthritis. . Current regimen:   o Tramadol 50mg  o Tizanidine 4mg  o Tart Cherry 1000mg  . Interventions: o Discussed avoidance of NSAIDs o Discussed current level of symptoms . Patient self care activities - Over the next 180 days, patient will: o Continue to take medications as directed o Contact providers with any new or worsening symptoms  Initial goal documentation        [name] was given information about Chronic Care Management services today including:  1. CCM service includes personalized support from designated clinical staff supervised by his physician, including individualized plan of care and coordination with other care providers 2. 24/7 contact phone numbers for assistance for urgent and routine care needs. 3. Standard insurance, coinsurance, copays and deductibles apply for chronic care management only during months in which we provide at least 20 minutes of these services. Most insurances cover these services at 100%, however patients may be responsible for any copay, coinsurance and/or deductible if applicable. This service may help you avoid the need for more expensive face-to-face services. 4. Only one practitioner may furnish and bill the service in a calendar month. 5. The patient may stop CCM services at any time (effective at the end of the month) by phone call to the office staff.  Patient agreed to services and verbal consent obtained.   The patient verbalized understanding of instructions, educational materials, and care plan provided today and agreed to receive a mailed copy of patient instructions, educational materials, and care plan.  Telephone follow up appointment with pharmacy team member scheduled for: 6  months  Beverly Milch, PharmD Clinical Pharmacist [facility] [phone]  High Cholesterol  High cholesterol is a condition in which the blood has high levels of a white, waxy, fat-like substance (cholesterol). The human body needs small amounts of  cholesterol. The liver makes all the cholesterol that the body needs. Extra (excess) cholesterol comes from the food that we eat. Cholesterol is carried from the liver by the blood through the blood vessels. If you have high cholesterol, deposits (plaques) may build up on the walls of your blood vessels (arteries). Plaques make the arteries narrower and stiffer. Cholesterol plaques increase your risk for heart attack and stroke. Work with your health care provider to keep your cholesterol levels in a healthy range. What increases the risk? This condition is more likely to develop in people who:  Eat foods that are high in animal fat (saturated fat) or cholesterol.  Are overweight.  Are not getting enough exercise.  Have a family history of high cholesterol. What are the signs or symptoms? There are no symptoms of this condition. How is this diagnosed? This condition may be diagnosed from the results of a blood test.  If you are older than age 38, your health care provider may check your cholesterol every 4-6 years.  You may be checked more often if you already have high cholesterol or other risk factors for heart disease. The blood test for cholesterol measures:  "Bad" cholesterol (LDL cholesterol). This is the main type of cholesterol that causes heart disease. The desired level for LDL is less than 100.  "Good" cholesterol (HDL cholesterol). This type helps to protect against heart disease by cleaning the arteries and carrying the LDL away. The desired level for HDL is 60 or higher.  Triglycerides. These are fats that the body can store or burn for energy. The desired number for triglycerides is lower than 150.  Total cholesterol. This is a measure of the total amount of cholesterol in your blood, including LDL cholesterol, HDL cholesterol, and triglycerides. A healthy number is less than 200. How is this treated? This condition is treated with diet changes, lifestyle changes, and  medicines. Diet changes  This may include eating more whole grains, fruits, vegetables, nuts, and fish.  This may also include cutting back on red meat and foods that have a lot of added sugar. Lifestyle changes  Changes may include getting at least 40 minutes of aerobic exercise 3 times a week. Aerobic exercises include walking, biking, and swimming. Aerobic exercise along with a healthy diet can help you maintain a healthy weight.  Changes may also include quitting smoking. Medicines  Medicines are usually given if diet and lifestyle changes have failed to reduce your cholesterol to healthy levels.  Your health care provider may prescribe a statin medicine. Statin medicines have been shown to reduce cholesterol, which can reduce the risk of heart disease. Follow these instructions at home: Eating and drinking If told by your health care provider:  Eat chicken (without skin), fish, veal, shellfish, ground [place] breast, and round or loin cuts of red meat.  Do not eat fried foods or fatty meats, such as hot dogs and salami.  Eat plenty of fruits, such as apples.  Eat plenty of vegetables, such as broccoli, potatoes, and carrots.  Eat beans, peas, and lentils.  Eat grains such as barley, rice, couscous, and bulgur wheat.  Eat pasta without cream sauces.  Use skim or nonfat milk, and eat low-fat or nonfat yogurt  and cheeses.  Do not eat or drink whole milk, cream, ice cream, egg yolks, or hard cheeses.  Do not eat stick margarine or tub margarines that contain trans fats (also called partially hydrogenated oils).  Do not eat saturated tropical oils, such as coconut oil and palm oil.  Do not eat cakes, cookies, crackers, or other baked goods that contain trans fats.  General instructions  Exercise as directed by your health care provider. Increase your activity level with activities such as gardening, walking, and taking the stairs.  Take over-the-counter and prescription  medicines only as told by your health care provider.  Do not use any products that contain nicotine or tobacco, such as cigarettes and e-cigarettes. If you need help quitting, ask your health care provider.  Keep all follow-up visits as told by your health care provider. This is important. Contact a health care provider if:  You are struggling to maintain a healthy diet or weight.  You need help to start on an exercise program.  You need help to stop smoking. Get help right away if:  You have chest pain.  You have trouble breathing. This information is not intended to replace advice given to you by your health care provider. Make sure you discuss any questions you have with your health care provider. Document Revised: 01/10/2017 Document Reviewed: 07/08/2015 Elsevier Patient Education  [place].

## 2019-12-09 ENCOUNTER — Other Ambulatory Visit: Payer: Self-pay | Admitting: Family Medicine

## 2019-12-10 ENCOUNTER — Other Ambulatory Visit: Payer: Self-pay | Admitting: *Deleted

## 2019-12-10 DIAGNOSIS — I10 Essential (primary) hypertension: Secondary | ICD-10-CM

## 2019-12-10 DIAGNOSIS — E785 Hyperlipidemia, unspecified: Secondary | ICD-10-CM

## 2019-12-10 DIAGNOSIS — E7800 Pure hypercholesterolemia, unspecified: Secondary | ICD-10-CM

## 2019-12-27 ENCOUNTER — Ambulatory Visit: Payer: PPO | Admitting: Family Medicine

## 2019-12-27 NOTE — Progress Notes (Signed)
[place] [address] [place] Phone: [phone] Subjective:   I [name] am serving as a Education administrator for Dr. Hulan Saas.  This visit occurred during the SARS-CoV-2 public health emergency.  Safety protocols were in place, including screening questions prior to the visit, additional usage of staff PPE, and extensive cleaning of exam room while observing appropriate contact time as indicated for disinfecting solutions.   I'm seeing this patient by the request  of:  Susy Frizzle, MD  CC: Knee pain follow-up  STM:[id number]   11/30/2019 Patient given injection today and tolerated the procedure well.  Patient does have severe bone-on-bone osteoarthritic changes of the medial compartment bilaterally.  Patient wants to hold on any type of surgical intervention.  He did do very well with the viscosupplementation last injections were in December 2020.  Patient will follow up again in 4 weeks and we will consider repeating that if we get prior approval.  Patient knows if the viscosupplementation does not last 6 months then we will consider surgical replacement  Update 12/28/2019 [name] is a 69 y.o. male coming in with complaint of bilateral knee pain. Monovisc approved. Patient states his knees are painful today.  Patient still failed the steroid injections.  Do have approval with viscosupplementation.  Patient has responded to this previously.  Patient wants to avoid surgical intervention if possible       Past Medical History:  Diagnosis Date  . Adenomatous colon polyp 2004  . Allergy   . Diverticulosis 2011  . Former smoker   . HLD (hyperlipidemia)   . Hyperplastic colon polyp 2004  . Hypertension   . Melanoma in situ [facility])    Past Surgical History:  Procedure Laterality Date  . COLONOSCOPY  2011   SEVERAL    Social History   Socioeconomic History  . Marital status: Married    Spouse name: Not on file  . Number of  children: 2  . Years of education: Not on file  . Highest education level: Not on file  Occupational History  . Occupation: part Scientist, clinical (histocompatibility and immunogenetics): [employer]  Tobacco Use  . Smoking status: Former Smoker    Packs/day: 1.00    Years: 30.00    Pack years: 30.00    Types: Cigarettes, Cigars    Quit date: 05/13/1998    Years since quitting: 21.6  . Smokeless tobacco: Current User    Types: Chew  Substance and Sexual Activity  . Alcohol use: Yes    Comment: freq  . Drug use: No  . Sexual activity: Not on file  Other Topics Concern  . Not on file  Social History Narrative  . Not on file   Social Determinants of Health   Financial Resource Strain: Low Risk   . Difficulty of Paying Living Expenses: Not very hard  Food Insecurity:   . Worried About Charity fundraiser in the Last Year: Not on file  . Ran Out of Food in the Last Year: Not on file  Transportation Needs:   . Lack of Transportation (Medical): Not on file  . Lack of Transportation (Non-Medical): Not on file  Physical Activity:   . Days of Exercise per Week: Not on file  . Minutes of Exercise per Session: Not on file  Stress:   . Feeling of Stress : Not on file  Social Connections:   . Frequency of Communication with Friends and Family: Not on  file  . Frequency of Social Gatherings with Friends and Family: Not on file  . Attends Religious Services: Not on file  . Active Member of Clubs or Organizations: Not on file  . Attends Archivist Meetings: Not on file  . Marital Status: Not on file   Allergies  Allergen Reactions  . Levaquin [Levofloxacin In D5w] Shortness Of Breath  . Crestor [Rosuvastatin Calcium] Diarrhea   Family History  Problem Relation Age of Onset  . Lung cancer Father   . Heart disease Mother        died in her 66's  . Diabetes Daughter      Current Outpatient Medications (Cardiovascular):  .  atorvastatin (LIPITOR) 40 MG tablet, Take 1 tablet (40 mg total) by mouth daily. Marland Kitchen   losartan (COZAAR) 50 MG tablet, TAKE 1 TABLET (50 MG TOTAL) BY MOUTH DAILY.  Current Outpatient Medications (Respiratory):  .  cetirizine (ZYRTEC) 10 MG tablet, TAKE 1 TABLET BY MOUTH EVERY DAY  Current Outpatient Medications (Analgesics):  .  traMADol (ULTRAM) 50 MG tablet, Take 1 tablet (50 mg total) by mouth every 8 (eight) hours as needed for severe pain.   Current Outpatient Medications (Other):  Marland Kitchen  Misc Natural Products (TART CHERRY ADVANCED PO), Take 1,000 mg by mouth daily. .  tamsulosin (FLOMAX) 0.4 MG CAPS capsule, Take 1 capsule (0.4 mg total) by mouth daily. Marland Kitchen  tiZANidine (ZANAFLEX) 4 MG tablet, Take 1 tablet (4 mg total) by mouth every 6 (six) hours as needed for muscle spasms. .  Vitamin D, Ergocalciferol, (DRISDOL) 1.25 MG (50000 UNIT) CAPS capsule, TAKE 1 CAPSULE (50,000 UNITS TOTAL) BY MOUTH EVERY 7 (SEVEN) DAYS.   Reviewed prior external information including notes and imaging from  primary care provider As well as notes that were available from care everywhere and other healthcare systems.  Past medical history, social, surgical and family history all reviewed in electronic medical record.  No pertanent information unless stated regarding to the chief complaint.   Review of Systems:  No headache, visual changes, nausea, vomiting, diarrhea, constipation, dizziness, abdominal pain, skin rash, fevers, chills, night sweats, weight loss, swollen lymph nodes, body aches, joint swelling, chest pain, shortness of breath, mood changes. POSITIVE muscle aches  Objective  Blood pressure 130/70, pulse 72, height 6\' 1"  (1.854 m), weight 222 lb (100.7 kg), SpO2 95 %.   General: No apparent distress alert and oriented x3 mood and affect normal, dressed appropriately.  HEENT: Pupils equal, extraocular movements intact  Respiratory: Patient's speak in full sentences and does not appear short of breath  Cardiovascular: No lower extremity edema, non tender, no erythema  Antalgic  gait Knee:Bilateral valgus deformity noted. Large thigh to calf ratio.  Tender to palpation over medial and PF joint line.  ROM full in flexion and extension and lower leg rotation. instability with valgus force.  painful patellar compression. Patellar glide with moderate crepitus. Patellar and quadriceps tendons unremarkable. Hamstring and quadriceps strength is normal.   After informed written and verbal consent, patient was seated on exam table. Right knee was prepped with alcohol swab and utilizing anterolateral approach, patient's right knee space was injected with 48 mg per 3 mL of Monovisc (sodium hyaluronate) in a prefilled syringe was injected easily into the knee through a 22-gauge needle..Patient tolerated the procedure well without immediate complications.  After informed written and verbal consent, patient was seated on exam table. Left knee was prepped with alcohol swab and utilizing anterolateral approach, patient's left knee space  was injected with 40 mg per 3 mL of Monovisc (sodium hyaluronate) in a prefilled syringe was injected easily into the knee through a 22-gauge needle..Patient tolerated the procedure well without immediate complications.    Impression and Recommendations:    The above documentation has been reviewed and is accurate and complete Lyndal Pulley, DO

## 2019-12-28 ENCOUNTER — Encounter: Payer: Self-pay | Admitting: Family Medicine

## 2019-12-28 ENCOUNTER — Other Ambulatory Visit: Payer: Self-pay

## 2019-12-28 ENCOUNTER — Ambulatory Visit: Payer: PPO | Admitting: Family Medicine

## 2019-12-28 VITALS — BP 130/70 | HR 72 | Ht 73.0 in | Wt 222.0 lb

## 2019-12-28 DIAGNOSIS — M25561 Pain in right knee: Secondary | ICD-10-CM

## 2019-12-28 DIAGNOSIS — M25562 Pain in left knee: Secondary | ICD-10-CM

## 2019-12-28 DIAGNOSIS — M170 Bilateral primary osteoarthritis of knee: Secondary | ICD-10-CM | POA: Diagnosis not present

## 2019-12-28 DIAGNOSIS — G8929 Other chronic pain: Secondary | ICD-10-CM | POA: Diagnosis not present

## 2019-12-28 NOTE — Assessment & Plan Note (Signed)
Chronic problem with exacerbation.  Viscosupplementation given again today.  Patient has done relatively well from multiple years.  Patient will consider the possibility of surgical intervention if this does not help.  Discussed topical anti-inflammatories and icing regimen.  Increase activity slowly.  Patient will follow up with me in 6 weeks if worsening symptoms otherwise can follow-up with me as needed

## 2019-12-29 LAB — SYNOVIAL FLUID ANALYSIS, COMPLETE
Basophils, %: 0 %
Eosinophils-Synovial: 0 % (ref 0–2)
Lymphocytes-Synovial Fld: 79 % — ABNORMAL HIGH (ref 0–74)
Monocyte/Macrophage: 13 % (ref 0–69)
Neutrophil, Synovial: 8 % (ref 0–24)
Synoviocytes, %: 0 % (ref 0–15)
WBC, Synovial: 95 cells/uL (ref <150)

## 2020-01-24 ENCOUNTER — Ambulatory Visit: Payer: Self-pay | Admitting: Pharmacist

## 2020-01-24 ENCOUNTER — Telehealth: Payer: Self-pay | Admitting: Pharmacist

## 2020-01-24 NOTE — Telephone Encounter (Cosign Needed)
Patient notified to stop for two weeks.  Will f/u in 14 days to see if this helps muscle pains.  Willa Frater, PharmD Clinical Pharmacist [facility] [phone]   6 minutes spent in review, coordination, and documentation.  Reviewed by: Willa Frater, PharmD Clinical Pharmacist [facility] [phone]

## 2020-01-24 NOTE — Chronic Care Management (AMB) (Signed)
  Chronic Care Management   Follow Up Note   01/24/2020 Name: [name] MRN: [id number] DOB: 1950/04/02  Referred by: [name], MD Reason for referral : No chief complaint on file.   [name] is a 70 y.o. year old male who is a primary care patient of Pickard, Priscille Heidelberg, MD. The CCM team was consulted for assistance with chronic disease management and care coordination needs.    Review of patient status, including review of consultants reports, relevant laboratory and other test results, and collaboration with appropriate care team members and the patient's provider was performed as part of comprehensive patient evaluation and provision of chronic care management services.    SDOH (Social Determinants of Health) assessments performed: No See Care Plan activities for detailed interventions related to [facility])     Outpatient Encounter Medications as of 01/24/2020  Medication Sig  . atorvastatin (LIPITOR) 40 MG tablet Take 1 tablet (40 mg total) by mouth daily.  . cetirizine (ZYRTEC) 10 MG tablet TAKE 1 TABLET BY MOUTH EVERY DAY  . losartan (COZAAR) 50 MG tablet TAKE 1 TABLET (50 MG TOTAL) BY MOUTH DAILY.  Marland Kitchen Misc Natural Products (TART CHERRY ADVANCED PO) Take 1,000 mg by mouth daily.  . tamsulosin (FLOMAX) 0.4 MG CAPS capsule Take 1 capsule (0.4 mg total) by mouth daily.  Marland Kitchen tiZANidine (ZANAFLEX) 4 MG tablet Take 1 tablet (4 mg total) by mouth every 6 (six) hours as needed for muscle spasms.  . traMADol (ULTRAM) 50 MG tablet Take 1 tablet (50 mg total) by mouth every 8 (eight) hours as needed for severe pain.  . Vitamin D, Ergocalciferol, (DRISDOL) 1.25 MG (50000 UNIT) CAPS capsule TAKE 1 CAPSULE (50,000 UNITS TOTAL) BY MOUTH EVERY 7 (SEVEN) DAYS.   No facility-administered encounter medications on file as of 01/24/2020.     Objective:  Patient contacted me with medication related question.  Goals Addressed   None     There are no care plans to display for this  patient.  [name] contacted me with a question on his medication profile.  He has been experiencing more muscle aches and pains over the last month and wanted to know if any of the medications he was taking may be causing this.    Reviewed profile and patient started atorvastatin 40mg  on 11/18/19.  He states he feels very tired and his muscles feel like he has been doing a full days work by the end of the day when he lays down in bed.     Plan:   Will consult with PCP and recommend a trial off for two weeks to see if this helps.  If this does help the patient's symptoms, would recommend initiating Rosuvastatin 10mg  every other day to see if the patient can tolerate this.  Then adjust dosing based on next lab results at follow up.  11/20/19, PharmD Clinical Pharmacist [facility] [phone]

## 2020-01-24 NOTE — Telephone Encounter (Signed)
-----   Message from [name], MD sent at 01/24/2020 10:39 AM EST ----- I am fine with him stopping it for 2 weeks to see if it helps.  ----- Message ----- From: Erroll Luna, [facility] Sent: 01/24/2020   9:13 AM EST To: [name], MD  Patient contacted me to ask if any meds on his chart are causing severe muscle aching at night and unable to sleep.  Started atorvastatin 40mg  after lab results on 10/28.  Thoughts on having him trial off for 2 weeks to see if this helps?  I would follow up in 2 weeks and we can determine next steps.  11/28, PharmD Clinical Pharmacist [facility] [phone]

## 2020-01-25 ENCOUNTER — Telehealth: Payer: Self-pay | Admitting: Pharmacist

## 2020-01-25 NOTE — Progress Notes (Signed)
Chronic Care Management Pharmacy Assistant   Name: [name]  MRN: [id number] DOB: [birth year]  Reason for Encounter: Disease State for [facility]  Patient Questions:  1.  Have you seen any other providers since your last visit? No.   2.  Any changes in your medicines or health? Yes.  PCP : [name], MD   Their chronic conditions include: HTN, Allergic Rhinitis, HLD, Degenerative arthritis of the knee.  Office Visits: None since 01/24/20  Consults: None since 01/24/20  Allergies:   Allergies  Allergen Reactions  . Levaquin [Levofloxacin In D5w] Shortness Of Breath  . Crestor [Rosuvastatin Calcium] Diarrhea    Medications: Outpatient Encounter Medications as of 01/25/2020  Medication Sig  . atorvastatin (LIPITOR) 40 MG tablet Take 1 tablet (40 mg total) by mouth daily.  . cetirizine (ZYRTEC) 10 MG tablet TAKE 1 TABLET BY MOUTH EVERY DAY  . losartan (COZAAR) 50 MG tablet TAKE 1 TABLET (50 MG TOTAL) BY MOUTH DAILY.  Marland Kitchen Misc Natural Products (TART CHERRY ADVANCED PO) Take 1,000 mg by mouth daily.  . tamsulosin (FLOMAX) 0.4 MG CAPS capsule Take 1 capsule (0.4 mg total) by mouth daily.  Marland Kitchen tiZANidine (ZANAFLEX) 4 MG tablet Take 1 tablet (4 mg total) by mouth every 6 (six) hours as needed for muscle spasms.  . traMADol (ULTRAM) 50 MG tablet Take 1 tablet (50 mg total) by mouth every 8 (eight) hours as needed for severe pain.  . Vitamin D, Ergocalciferol, (DRISDOL) 1.25 MG (50000 UNIT) CAPS capsule TAKE 1 CAPSULE (50,000 UNITS TOTAL) BY MOUTH EVERY 7 (SEVEN) DAYS.   No facility-administered encounter medications on file as of 01/25/2020.    Current Diagnosis: Patient Active Problem List   Diagnosis Date Noted  . Left hamstring injury 09/13/2019  . Low back pain radiating to right leg 06/15/2019  . Posterior tibial tendinitis, left 12/04/2018  . Bilateral leg pain 05/21/2017  . Degenerative arthritis of knee, bilateral 02/18/2017  . Bilateral knee pain 07/18/2016  . Right  foot pain 04/16/2016  . LLQ abdominal pain 06/10/2013  . Melanoma in situ (HCC)   . Shortness of breath 05/25/2012  . Allergic rhinitis 05/25/2012  . Diverticulosis   . Former smoker   . HLD (hyperlipidemia)   . Allergy   . ABDOMINAL PAIN, LEFT LOWER QUADRANT 10/23/2009  . GERD 03/31/2007  . DIVERTICULITIS, ACUTE 03/31/2007  . SINUSITIS- ACUTE-NOS 02/18/2007  . URI 02/18/2007  . ERECTILE DYSFUNCTION 10/29/2006  . TOBACCO ABUSE 10/29/2006  . HYPERLIPIDEMIA 10/01/2006  . LABILE HYPERTENSION 10/01/2006  . DIVERTICULOSIS, COLON 10/01/2006    Goals Addressed   None    01/25/2020 Name: GREGOR DERSHEM MRN: [id number] DOB: 1950-02-11 [name] is a 70 y.o. year old male who is a primary care patient of [name], MD.  Comprehensive medication review performed; Spoke to patient regarding cholesterol  Lipid Panel    Component Value Date/Time   CHOL 227 (H) 11/16/2019 1516   TRIG 240 (H) 11/16/2019 1516   HDL 31 (L) 11/16/2019 1516   LDLCALC 155 (H) 11/16/2019 1516    10-year ASCVD risk score: The 10-year ASCVD risk score Denman George DC Jr., et al., 2013) is: 28.9%   Values used to calculate the score:     Age: 70 years     Sex: Male     Is Non-Hispanic African American: No     Diabetic: No     Tobacco smoker: No     Systolic Blood Pressure: 130  mmHg     Is BP treated: Yes     HDL Cholesterol: 31 mg/dL     Total Cholesterol: 227 mg/dL   Upon chart review, I noticed the clinical pharmacist spoke with the patient on (01/24/20) in reference to his current cholesterol regimen. Confirmed with the CPP that the patient is holding (Atorvastatin 40 mg) for 2 weeks at this time due to muscle aches. Disease state call will be coordinated the following month as a follow up to his symptoms.    Follow-Up:  Pharmacist Review  Charlann Lange, [place] Pharmacist Assistant [phone]

## 2020-02-06 ENCOUNTER — Other Ambulatory Visit: Payer: Self-pay | Admitting: Family Medicine

## 2020-02-07 NOTE — Progress Notes (Unsigned)
[place] [address] [place] Phone: [phone] Subjective:   I [name] am serving as a Education administrator for Dr. Hulan Saas.  This visit occurred during the SARS-CoV-2 public health emergency.  Safety protocols were in place, including screening questions prior to the visit, additional usage of staff PPE, and extensive cleaning of exam room while observing appropriate contact time as indicated for disinfecting solutions.   I'm seeing this patient by the request  of:  Susy Frizzle, MD  CC: Bilateral  [id number]   12/28/2019 Chronic problem with exacerbation.  Viscosupplementation given again today.  Patient has done relatively well from multiple years.  Patient will consider the possibility of surgical intervention if this does not help.  Discussed topical anti-inflammatories and icing regimen.  Increase activity slowly.  Patient will follow up with me in 6 weeks if worsening symptoms otherwise can follow-up with me as needed  Update 02/08/2020 [name] is a 70 y.o. male coming in with complaint of bilateral knee pain. Patient states his knees have been helping. Believes the injection will not help much. Patient is wondering what the next step is at this time to help. Patient feels that any more injections would not be helpful.   Monovisc given 12/76/2021  Severe OA on xrays since 2019  Past Medical History:  Diagnosis Date  . Adenomatous colon polyp 2004  . Allergy   . Diverticulosis 2011  . Former smoker   . HLD (hyperlipidemia)   . Hyperplastic colon polyp 2004  . Hypertension   . Melanoma in situ [facility])    Past Surgical History:  Procedure Laterality Date  . COLONOSCOPY  2011   SEVERAL    Social History   Socioeconomic History  . Marital status: Married    Spouse name: Not on file  . Number of children: 2  . Years of education: Not on file  . Highest education level: Not on file  Occupational History  .  Occupation: part Scientist, clinical (histocompatibility and immunogenetics): [employer]  Tobacco Use  . Smoking status: Former Smoker    Packs/day: 1.00    Years: 30.00    Pack years: 30.00    Types: Cigarettes, Cigars    Quit date: 05/13/1998    Years since quitting: 21.7  . Smokeless tobacco: Current User    Types: Chew  Substance and Sexual Activity  . Alcohol use: Yes    Comment: freq  . Drug use: No  . Sexual activity: Not on file  Other Topics Concern  . Not on file  Social History Narrative  . Not on file   Social Determinants of Health   Financial Resource Strain: Low Risk   . Difficulty of Paying Living Expenses: Not very hard  Food Insecurity: Not on file  Transportation Needs: Not on file  Physical Activity: Not on file  Stress: Not on file  Social Connections: Not on file   Allergies  Allergen Reactions  . Levaquin [Levofloxacin In D5w] Shortness Of Breath  . Crestor [Rosuvastatin Calcium] Diarrhea   Family History  Problem Relation Age of Onset  . Lung cancer Father   . Heart disease Mother        died in her 45's  . Diabetes Daughter      Current Outpatient Medications (Cardiovascular):  .  atorvastatin (LIPITOR) 40 MG tablet, Take 1 tablet (40 mg total) by mouth daily. Marland Kitchen  losartan (COZAAR) 50 MG tablet, TAKE 1 TABLET (50  MG TOTAL) BY MOUTH DAILY.  Current Outpatient Medications (Respiratory):  .  cetirizine (ZYRTEC) 10 MG tablet, TAKE 1 TABLET BY MOUTH EVERY DAY  Current Outpatient Medications (Analgesics):  .  traMADol (ULTRAM) 50 MG tablet, Take 1 tablet (50 mg total) by mouth every 8 (eight) hours as needed for severe pain.   Current Outpatient Medications (Other):  Marland Kitchen  Misc Natural Products (TART CHERRY ADVANCED PO), Take 1,000 mg by mouth daily. .  tamsulosin (FLOMAX) 0.4 MG CAPS capsule, Take 1 capsule (0.4 mg total) by mouth daily. Marland Kitchen  tiZANidine (ZANAFLEX) 4 MG tablet, Take 1 tablet (4 mg total) by mouth every 6 (six) hours as needed for muscle spasms. .  Vitamin D,  Ergocalciferol, (DRISDOL) 1.25 MG (50000 UNIT) CAPS capsule, TAKE 1 CAPSULE (50,000 UNITS TOTAL) BY MOUTH EVERY 7 (SEVEN) DAYS.   Reviewed prior external information including notes and imaging from  primary care provider As well as notes that were available from care everywhere and other healthcare systems.  Past medical history, social, surgical and family history all reviewed in electronic medical record.  No pertanent information unless stated regarding to the chief complaint.   Review of Systems:  No headache, visual changes, nausea, vomiting, diarrhea, constipation, dizziness, abdominal pain, skin rash, fevers, chills, night sweats, weight loss, swollen lymph nodes,  joint swelling, chest pain, shortness of breath, mood changes. POSITIVE muscle aches  Objective  Blood pressure 140/82, pulse 75, height 6\' 1"  (1.854 m), weight 222 lb (100.7 kg), SpO2 96 %.   General: No apparent distress alert and oriented x3 mood and affect normal, dressed appropriately.  HEENT: Pupils equal, extraocular movements intact  Respiratory: Patient's speak in full sentences and does not appear short of breath  Cardiovascular: No lower extremity edema, non tender, no erythema  Antalgic gait   Patient does have some instability with walking moderately. Significant knee arthritis noted to the knees bilaterally. Instability noted with valgus and varus force crepitus noted with range of motion of both knees lacking the last 2 degrees of extension in the last 5 degrees of flexion    Impression and Recommendations:     The above documentation has been reviewed and is accurate and complete Lyndal Pulley, DO

## 2020-02-08 ENCOUNTER — Other Ambulatory Visit: Payer: Self-pay

## 2020-02-08 ENCOUNTER — Encounter: Payer: Self-pay | Admitting: Family Medicine

## 2020-02-08 ENCOUNTER — Ambulatory Visit: Payer: PPO | Admitting: Family Medicine

## 2020-02-08 ENCOUNTER — Ambulatory Visit: Payer: PPO | Admitting: Pharmacist

## 2020-02-08 DIAGNOSIS — M170 Bilateral primary osteoarthritis of knee: Secondary | ICD-10-CM

## 2020-02-08 DIAGNOSIS — E7800 Pure hypercholesterolemia, unspecified: Secondary | ICD-10-CM

## 2020-02-08 DIAGNOSIS — I10 Essential (primary) hypertension: Secondary | ICD-10-CM

## 2020-02-08 NOTE — Assessment & Plan Note (Signed)
We have gotten to know patient fairly well. Patient has been doing well with severe arthritic changes for multiple years. Unfortunately the viscosupplementation no longer seems to be helpful. At this point we will refer patient to orthopedic surgery to discuss next best option which would be surgical replacement. Discussed more of the topical anti-inflammatories. Patient has had tramadol that has been prescribed by another provider previously and he can discuss with them if they want to continue but we will not. Patient knows that I am here for any questions of patient needs.

## 2020-02-08 NOTE — Patient Instructions (Signed)
Good to see you Referral sent to [facility] Can continue pennsaid and icing if it helps [facility] "total support" orthotics I am here if you have any questions

## 2020-02-08 NOTE — Patient Instructions (Addendum)
Visit Information  Goals Addressed            This Visit's Progress   . Pharmacy Care Plan:       CARE PLAN ENTRY (see longitudinal plan of care for additional care plan information)  Current Barriers:  . Chronic Disease Management support, education, and care coordination needs related to Hypertension, Hyperlipidemia, and Arthritis   Hypertension BP Readings from Last 3 Encounters:  12/28/19 130/70  11/30/19 130/70  11/16/19 140/90   . Pharmacist Clinical Goal(s): o Over the next 280 days, patient will work with PharmD and providers to maintain BP goal <140/90 . Current regimen:  o Losartan 50mg  daily . Interventions: o Reviewed home BP monitoring o Discussed med adherence o Recommend monitoring 30 to 60 minutes after medication . Patient self care activities - Over the next 120 days, patient will: o Check BP daily, document, and provide at future appointments o Ensure daily salt intake < 2300 mg/day  Hyperlipidemia Lab Results  Component Value Date/Time   LDLCALC 155 (H) 11/16/2019 03:16 PM   . Pharmacist Clinical Goal(s): o Over the next 180 days, patient will work with PharmD and providers to achieve LDL goal < 100 . Current regimen:  o No medications - stopped statin due to muscle pains . Interventions: o Reviewed most recent lipid panel o Recommended patient work on eliminating fatty and fried foods from diet o Recommend f/u lipid panel in Feb. . Patient self care activities - Over the next 120 days, patient will: o Continue to focus on medication adherence o Work on dietary modifications limiting amount of cholesterol in diet   Arthritis . Pharmacist Clinical Goal(s) o Over the next 180 days, patient will work with PharmD and providers to optimize meds and minimize symptoms of arthritis. . Current regimen:  o Tramadol 50mg  o Tizanidine 4mg  o Tart Cherry 1000mg  . Interventions: o Discussed avoidance of NSAIDs o Discussed current level of  symptoms . Patient self care activities - Over the next 180 days, patient will: o Continue to take medications as directed o Contact providers with any new or worsening symptoms  Please see past updates related to this goal by clicking on the "Past Updates" button in the selected goal         The patient verbalized understanding of instructions, educational materials, and care plan provided today and agreed to receive a mailed copy of patient instructions, educational materials, and care plan.   Telephone follow up appointment with pharmacy team member scheduled for: 4 months  [name], [facility]  Preventing High Cholesterol Cholesterol is a white, waxy substance similar to fat that the human body needs to help build cells. The liver makes all the cholesterol that a person's body needs. Having high cholesterol (hypercholesterolemia) increases your risk for heart disease and stroke. Extra or excess cholesterol comes from the food that you eat. High cholesterol can often be prevented with diet and lifestyle changes. If you already have high cholesterol, you can control it with diet, lifestyle changes, and medicines. How can high cholesterol affect me? If you have high cholesterol, fatty deposits (plaques) may build up on the walls of your blood vessels. The blood vessels that carry blood away from your heart are called arteries. Plaques make the arteries narrower and stiffer. This in turn can:  Restrict or block blood flow and cause blood clots to form.  Increase your risk for heart attack and stroke. What can increase my risk for high cholesterol? This condition  is more likely to develop in people who:  Eat foods that are high in saturated fat or cholesterol. Saturated fat is mostly found in foods that come from animal sources.  Are overweight.  Are not getting enough exercise.  Have a family history of high cholesterol (familial hypercholesterolemia). What actions can I take to  prevent this? Nutrition  Eat less saturated fat.  Avoid trans fats (partially hydrogenated oils). These are often found in margarine and in some baked goods, fried foods, and snacks bought in packages.  Avoid precooked or cured meat, such as bacon, sausages, or meat loaves.  Avoid foods and drinks that have added sugars.  Eat more fruits, vegetables, and whole grains.  Choose healthy sources of protein, such as fish, poultry, lean cuts of red meat, beans, peas, lentils, and nuts.  Choose healthy sources of fat, such as: ? Nuts. ? Vegetable oils, especially olive oil. ? Fish that have healthy fats, such as omega-3 fatty acids. These fish include mackerel or salmon.   Lifestyle  Lose weight if you are overweight. Maintaining a healthy body mass index (BMI) can help prevent or control high cholesterol. It can also lower your risk for diabetes and high blood pressure. Ask your health care provider to help you with a diet and exercise plan to lose weight safely.  Do not use any products that contain nicotine or tobacco, such as cigarettes, e-cigarettes, and chewing tobacco. If you need help quitting, ask your health care provider. Alcohol use  Do not drink alcohol if: ? Your health care provider tells you not to drink. ? You are pregnant, may be pregnant, or are planning to become pregnant.  If you drink alcohol: ? Limit how much you use to:  0-1 drink a day for women.  0-2 drinks a day for men. ? Be aware of how much alcohol is in your drink. In the [place] one drink equals one 12 oz bottle of beer (355 mL), one 5 oz glass of wine (148 mL), or one 1 oz glass of hard liquor (44 mL). Activity  Get enough exercise. Do exercises as told by your health care provider.  Each week, do at least 150 minutes of exercise that takes a medium level of effort (moderate-intensity exercise). This kind of exercise: ? Makes your heart beat faster while allowing you to still be able to talk. ? Can  be done in short sessions several times a day or longer sessions a few times a week. For example, on 5 days each week, you could walk fast or ride your bike 3 times a day for 10 minutes each time.   Medicines  Your health care provider may recommend medicines to help lower cholesterol. This may be a medicine to lower the amount of cholesterol that your liver makes. You may need medicine if: ? Diet and lifestyle changes have not lowered your cholesterol enough. ? You have high cholesterol and other risk factors for heart disease or stroke.  Take over-the-counter and prescription medicines only as told by your health care provider. General information  Manage your risk factors for high cholesterol. Talk with your health care provider about all your risk factors and how to lower your risk.  Manage other conditions that you have, such as diabetes or high blood pressure (hypertension).  Have blood tests to check your cholesterol levels at regular points in time as told by your health care provider.  Keep all follow-up visits as told by your health care provider.  This is important. Where to find more information  American Heart Association: www.heart.org  National Heart, Lung, and Blood Institute: https://wilson-eaton.com/ Summary  High cholesterol increases your risk for heart disease and stroke. By keeping your cholesterol level low, you can reduce your risk for these conditions.  High cholesterol can often be prevented with diet and lifestyle changes.  Work with your health care provider to manage your risk factors, and have your blood tested regularly. This information is not intended to replace advice given to you by your health care provider. Make sure you discuss any questions you have with your health care provider. Document Revised: 10/20/2018 Document Reviewed: 10/20/2018 Elsevier Patient Education  [place].

## 2020-02-08 NOTE — Chronic Care Management (AMB) (Signed)
 Chronic Care Management Pharmacy  Name: [name]  MRN: [id number] DOB: 04/12/1950  Chief Complaint/ HPI  [name],  69 y.o. , male presents for their Follow-Up CCM visit with the clinical pharmacist via telephone.  PCP : Pickard, Warren T, MD  Their chronic conditions include: HTN, Allergic Rhinitis, HLD, Degenerative arthritis of the knee.  Office Visits: 11/16/2019 (Pickard) - reports lower urinary tract symptoms, PCP concluded BPH and the patient was started on Flomax 0.4mg nightly to see if this helps with night time symptoms.  Consult Visit: 11/30/2019 (Smith, Ortho) - Patient has severe bone on bone arthritis of his knees, he was given an injection and opts to hold off on surgical intervention.    Medications: Outpatient Encounter Medications as of 02/08/2020  Medication Sig  . atorvastatin (LIPITOR) 40 MG tablet Take 1 tablet (40 mg total) by mouth daily.  . cetirizine (ZYRTEC) 10 MG tablet TAKE 1 TABLET BY MOUTH EVERY DAY  . losartan (COZAAR) 50 MG tablet TAKE 1 TABLET (50 MG TOTAL) BY MOUTH DAILY.  . Misc Natural Products (TART CHERRY ADVANCED PO) Take 1,000 mg by mouth daily.  . tamsulosin (FLOMAX) 0.4 MG CAPS capsule Take 1 capsule (0.4 mg total) by mouth daily.  . tiZANidine (ZANAFLEX) 4 MG tablet Take 1 tablet (4 mg total) by mouth every 6 (six) hours as needed for muscle spasms.  . traMADol (ULTRAM) 50 MG tablet Take 1 tablet (50 mg total) by mouth every 8 (eight) hours as needed for severe pain.  . Vitamin D, Ergocalciferol, (DRISDOL) 1.25 MG (50000 UNIT) CAPS capsule TAKE 1 CAPSULE (50,000 UNITS TOTAL) BY MOUTH EVERY 7 (SEVEN) DAYS.   No facility-administered encounter medications on file as of 02/08/2020.    Current Diagnosis/Assessment: Financial Resource Strain: Low Risk   . Difficulty of Paying Living Expenses: Not very hard    Goals Addressed            This Visit's Progress   . Pharmacy Care Plan:       CARE PLAN ENTRY (see longitudinal  plan of care for additional care plan information)  Current Barriers:  . Chronic Disease Management support, education, and care coordination needs related to Hypertension, Hyperlipidemia, and Arthritis   Hypertension BP Readings from Last 3 Encounters:  12/28/19 130/70  11/30/19 130/70  11/16/19 140/90   . Pharmacist Clinical Goal(s): o Over the next 280 days, patient will work with PharmD and providers to maintain BP goal <140/90 . Current regimen:  o Losartan 50mg daily . Interventions: o Reviewed home BP monitoring o Discussed med adherence o Recommend monitoring 30 to 60 minutes after medication . Patient self care activities - Over the next 120 days, patient will: o Check BP daily, document, and provide at future appointments o Ensure daily salt intake < 2300 mg/day  Hyperlipidemia Lab Results  Component Value Date/Time   LDLCALC 155 (H) 11/16/2019 03:16 PM   . Pharmacist Clinical Goal(s): o Over the next 180 days, patient will work with PharmD and providers to achieve LDL goal < 100 . Current regimen:  o No medications - stopped statin due to muscle pains . Interventions: o Reviewed most recent lipid panel o Recommended patient work on eliminating fatty and fried foods from diet o Recommend f/u lipid panel in Feb. . Patient self care activities - Over the next 120 days, patient will: o Continue to focus on medication adherence o Work on dietary modifications limiting amount of cholesterol in diet   Arthritis .   Pharmacist Clinical Goal(s) o Over the next 180 days, patient will work with PharmD and providers to optimize meds and minimize symptoms of arthritis. . Current regimen:  o Tramadol 50mg o Tizanidine 4mg o Tart Cherry 1000mg . Interventions: o Discussed avoidance of NSAIDs o Discussed current level of symptoms . Patient self care activities - Over the next 180 days, patient will: o Continue to take medications as directed o Contact providers with any  new or worsening symptoms  Please see past updates related to this goal by clicking on the "Past Updates" button in the selected goal         Hypertension   BP goal is:  <140/90  Office blood pressures are  BP Readings from Last 3 Encounters:  12/28/19 130/70  11/30/19 130/70  11/16/19 140/90   Patient checks BP at home  about every other day Patient home BP readings are ranging: 134/83 average  Patient has failed these meds in the past: none noted Patient is currently controlled on the following medications:  . Losartan 50mg daily  We discussed   He takes blood pressure before taking his medication.  Have asked him to try and check it 30 to 60 minutes after taking his medication to determine efficacy.  Patient continues to deny dizziness or headaches.  Continues to report adherence.  Patient plans to start exercising a little bit more.  Plan  Continue current medications     Hyperlipidemia   LDL goal < 100  Last lipids Lab Results  Component Value Date   CHOL 227 (H) 11/16/2019   HDL 31 (L) 11/16/2019   LDLCALC 155 (H) 11/16/2019   TRIG 240 (H) 11/16/2019   CHOLHDL 7.3 (H) 11/16/2019   Hepatic Function Latest Ref Rng & Units 11/16/2019 03/20/2017 10/06/2015  Total Protein 6.1 - 8.1 g/dL 6.7 6.6 6.3  Albumin 3.6 - 5.1 g/dL - - 4.0  AST 10 - 35 U/L 27 19 19  ALT 9 - 46 U/L 30 20 17  Alk Phosphatase 40 - 115 U/L - - 68  Total Bilirubin 0.2 - 1.2 mg/dL 0.6 0.8 0.6     The 10-year ASCVD risk score (Goff DC Jr., et al., 2013) is: 28.9%   Values used to calculate the score:     Age: 69 years     Sex: Male     Is Non-Hispanic African American: No     Diabetic: No     Tobacco smoker: No     Systolic Blood Pressure: 130 mmHg     Is BP treated: Yes     HDL Cholesterol: 31 mg/dL     Total Cholesterol: 227 mg/dL   Patient has failed these meds in past: Crestor (diarrhea) Patient is currently uncontrolled on the following medications:  . Atorvastatin 40mg  - patient has been holding for two weeks due to myalgias  We discussed:    He reports that sine his statin holiday his muscle pains have improved tremendously.  Says his back and knee pain have eased up gradually ever since he d/c the medication.  Recommended patient work on diet and exercise to try and bring cholesterol down.  Encouraged him to schedule follow up visit with Dr. Pickard in February to have labs re-drawn.  If LDL and TC still elevated would recommend initiation of Zetia 10mg daily to replace statin due to history of myalgias.   Plan  Continue current medications  Follow up lipid panel in Feb. If elevated initiate Zetia 10mg daily   Arthritis of the Knee   Patient has failed these meds in past: none noted Patient is currently controlled on the following medications:  . Tramadol 79m . Tizanidine 484mdaily . Tart Cherry 100052maily  We discussed:   He is taking tramadol mainly at night to help him sleep, tizanidine in the morning to help ease the pain  Continues to be very active at work, plans to retire within a year or two  Discussed avoidance of NSAIDs - he is no longer taking diclofenac and only uses Tylenol OTC  Plan  Continue current medications Allergic rhinitis   Patient has failed these meds in past: none noted Patient is currently controlled on the following medications:  . Cetirizine 81m24me discussed:    No longer taking Flonase  He takes cetirizine in the morning, no issues with daytime drowsiness  Reports allergy symptoms will worsen for a week or so during season change, then slowly improve  No issues at the moment  Plan  Continue current medications  Vaccines   Reviewed and discussed patient's vaccination history.    Immunization History  Administered Date(s) Administered  . Influenza,inj,Quad PF,6+ Mos 12/16/2012, 11/03/2014  . Influenza-Unspecified 10/21/2016  . Pneumococcal Conjugate-13 07/01/2014  . Pneumococcal  Polysaccharide-23 06/19/2012, 11/30/2019  . Td 06/12/2001  . Tdap 05/18/2013    Plan  Recommended patient receive Flu and shingles vaccine. Medication Management   . Miscellaneous medications:  o Vitamin D 50000u weekly . OTC's:  o Tylenol Arthritis 650mg59mid . Patient currently uses [facility].   . Patient reports using no specific method to organize medications and promote adherence. . Patient denies missed doses of medication.   ChrisBeverly MilchrmD Clinical Pharmacist [phone]

## 2020-02-10 ENCOUNTER — Telehealth: Payer: Self-pay

## 2020-02-18 ENCOUNTER — Telehealth: Payer: Self-pay | Admitting: Pharmacist

## 2020-02-18 NOTE — Progress Notes (Addendum)
Checking the patient's chart for any medication changes since their last CCM visit. The patient had an OV 02-08-2020 for osteoarthritis of both knees seen by Dr. Hulan Saas. There were no medication changes made at this visit.  Fanny Skates, [facility] Pharmacist Assistant [phone]  2 minutes spent in review, coordination, and documentation.  Reviewed by: Beverly Milch, PharmD Clinical Pharmacist [facility] [phone]

## 2020-02-24 DIAGNOSIS — H0011 Chalazion right upper eyelid: Secondary | ICD-10-CM | POA: Diagnosis not present

## 2020-02-28 ENCOUNTER — Telehealth: Payer: Self-pay | Admitting: Family Medicine

## 2020-02-28 ENCOUNTER — Other Ambulatory Visit: Payer: Self-pay

## 2020-02-28 DIAGNOSIS — G8929 Other chronic pain: Secondary | ICD-10-CM

## 2020-02-28 NOTE — Telephone Encounter (Signed)
Patient called stating that he would like to proceed with the Ortho referral for his knee.  In last office note, Dr Tamala Julian mentioned Dr Mayer Camel.

## 2020-02-28 NOTE — Telephone Encounter (Signed)
Done. Notified patient via mychart.

## 2020-03-06 ENCOUNTER — Encounter: Payer: Self-pay | Admitting: Gastroenterology

## 2020-03-13 ENCOUNTER — Telehealth: Payer: Self-pay | Admitting: Pharmacist

## 2020-03-13 NOTE — Progress Notes (Addendum)
Chronic Care Management Pharmacy Assistant   Name: [name]  MRN: [id number] DOB: [birth year]  Reason for Encounter: Disease [facility]  Patient Questions:  1.  Have you seen any other providers since your last visit? No.   2.  Any changes in your medicines or health? No.   PCP : Michael Frizzle, MD   Their chronic conditions include: HTN, Allergic Rhinitis, HLD, Degenerative arthritis of the knee.  Office Visits: None since 02/18/20  Consults: None since 02/18/20  Allergies:   Allergies  Allergen Reactions   Levaquin [Levofloxacin In D5w] Shortness Of Breath   Crestor [Rosuvastatin Calcium] Diarrhea    Medications: Outpatient Encounter Medications as of 03/13/2020  Medication Sig   atorvastatin (LIPITOR) 40 MG tablet Take 1 tablet (40 mg total) by mouth daily.   cetirizine (ZYRTEC) 10 MG tablet TAKE 1 TABLET BY MOUTH EVERY DAY   losartan (COZAAR) 50 MG tablet TAKE 1 TABLET (50 MG TOTAL) BY MOUTH DAILY.   Misc Natural Products (TART CHERRY ADVANCED PO) Take 1,000 mg by mouth daily.   tamsulosin (FLOMAX) 0.4 MG CAPS capsule Take 1 capsule (0.4 mg total) by mouth daily.   tiZANidine (ZANAFLEX) 4 MG tablet Take 1 tablet (4 mg total) by mouth every 6 (six) hours as needed for muscle spasms.   traMADol (ULTRAM) 50 MG tablet Take 1 tablet (50 mg total) by mouth every 8 (eight) hours as needed for severe pain.   Vitamin D, Ergocalciferol, (DRISDOL) 1.25 MG (50000 UNIT) CAPS capsule TAKE 1 CAPSULE (50,000 UNITS TOTAL) BY MOUTH EVERY 7 (SEVEN) DAYS.   No facility-administered encounter medications on file as of 03/13/2020.    Current Diagnosis: Patient Active Problem List   Diagnosis Date Noted   Left hamstring injury 09/13/2019   Low back pain radiating to right leg 06/15/2019   Posterior tibial tendinitis, left 12/04/2018   Bilateral leg pain 05/21/2017   Degenerative arthritis of knee, bilateral 02/18/2017   Bilateral knee pain 07/18/2016   Right foot pain  04/16/2016   LLQ abdominal pain 06/10/2013   Melanoma in situ ([place])    Shortness of breath 05/25/2012   Allergic rhinitis 05/25/2012   Diverticulosis    Former smoker    HLD (hyperlipidemia)    Allergy    ABDOMINAL PAIN, LEFT LOWER QUADRANT 10/23/2009   GERD 03/31/2007   DIVERTICULITIS, ACUTE 03/31/2007   SINUSITIS- ACUTE-NOS 02/18/2007   URI 02/18/2007   ERECTILE DYSFUNCTION 10/29/2006   TOBACCO ABUSE 10/29/2006   HYPERLIPIDEMIA 10/01/2006   Essential hypertension 10/01/2006   DIVERTICULOSIS, COLON 10/01/2006    Goals Addressed   None    03/13/2020 Name: [name] MRN: [id number] DOB: 10-Aug-1950 [name] is a 70 y.o. year old male who is a primary care patient of Michael Frizzle, MD.  Comprehensive medication review performed; Spoke to patient regarding cholesterol  Lipid Panel    Component Value Date/Time   CHOL 227 (H) 11/16/2019 1516   TRIG 240 (H) 11/16/2019 1516   HDL 31 (L) 11/16/2019 1516   [place] 155 (H) 11/16/2019 1516    10-year ASCVD risk score: The 10-year ASCVD risk score Michael Bussing DC Jr., et al., 2013) is: 32.3%   Values used to calculate the score:     Age: 61 years     Sex: Male     Is Non-Hispanic African American: No     Diabetic: No     Tobacco smoker: No     Systolic Blood Pressure:  140 mmHg     Is BP treated: Yes     HDL Cholesterol: 31 mg/dL     Total Cholesterol: 227 mg/dL  Current antihyperlipidemic regimen:  No medications-stopped statin due to muscle pains  Previous antihyperlipidemic medications tried: Atorvastatin 40 mg   ASCVD risk enhancing conditions: age >3 and HTN   What recent interventions/DTPs have been made by any provider to improve Cholesterol control since last CPP Visit: None.  Any recent hospitalizations or ED visits since last visit with CPP? Patient stated no.  What diet changes have been made to improve Cholesterol?  Patent stated he has cut back on red meat. He stated he cut back on drinking soda and  sweet tea.  What exercise is being done to improve Cholesterol?  Patient stated he works daily and even works in his garage after work.   Adherence Review: Does the patient have >5 day gap between last estimated fill dates? No  Patient stated he doesn't have any concerns about his medications at this time. He stated he has appointment with his surgeon about his knees on march 1st and once he gets the needed information from that doctor he will make appointment with Dr. Dennard Schaumann.     Follow-Up:  Pharmacist Review   Charlann Lange, RMA Clinical Pharmacist Assistant [phone]  10 minutes spent in review, coordination, and documentation.  Reviewed by: Beverly Milch, PharmD Clinical Pharmacist [facility] [phone]

## 2020-03-15 ENCOUNTER — Telehealth: Payer: Self-pay | Admitting: Pharmacist

## 2020-03-15 NOTE — Progress Notes (Signed)
ERROR

## 2020-03-21 DIAGNOSIS — M25561 Pain in right knee: Secondary | ICD-10-CM | POA: Diagnosis not present

## 2020-03-21 DIAGNOSIS — M25562 Pain in left knee: Secondary | ICD-10-CM | POA: Diagnosis not present

## 2020-03-21 DIAGNOSIS — M170 Bilateral primary osteoarthritis of knee: Secondary | ICD-10-CM | POA: Diagnosis not present

## 2020-03-27 ENCOUNTER — Encounter: Payer: Self-pay | Admitting: Family Medicine

## 2020-03-27 ENCOUNTER — Ambulatory Visit (INDEPENDENT_AMBULATORY_CARE_PROVIDER_SITE_OTHER): Payer: PPO | Admitting: Family Medicine

## 2020-03-27 ENCOUNTER — Other Ambulatory Visit: Payer: Self-pay

## 2020-03-27 VITALS — BP 122/76 | HR 74 | Temp 97.7°F | Ht 73.0 in | Wt 225.0 lb

## 2020-03-27 DIAGNOSIS — Z01818 Encounter for other preprocedural examination: Secondary | ICD-10-CM | POA: Diagnosis not present

## 2020-03-27 DIAGNOSIS — I10 Essential (primary) hypertension: Secondary | ICD-10-CM

## 2020-03-27 DIAGNOSIS — E7800 Pure hypercholesterolemia, unspecified: Secondary | ICD-10-CM | POA: Diagnosis not present

## 2020-03-27 MED ORDER — LOSARTAN POTASSIUM 100 MG PO TABS: 100.0000 mg | ORAL_TABLET | Freq: Every day | ORAL | 3 refills | Status: DC

## 2020-03-27 MED ORDER — TRAMADOL HCL 50 MG PO TABS: 50.0000 mg | ORAL_TABLET | Freq: Three times a day (TID) | ORAL | 0 refills | Status: AC | PRN

## 2020-03-27 NOTE — Progress Notes (Signed)
Subjective:    Patient ID: [name], male    DOB: 09-11-1950, 70 y.o.   MRN: [id number]  HPI  Patient is a very pleasant 70 year old Caucasian male here today for preoperative evaluation for knee replacement.  He has end-stage osteoarthritis and is failing conservative management and is therefore been referred to [facility] for knee replacement.  He denies any chest pain.  He denies any shortness of breath with activity.  He denies any orthopnea or paroxysmal nocturnal dyspnea.  I saw the patient in October and at that time he did have some mild chronic kidney disease and I recommended that he stop taking NSAIDs.  He is been taking Tylenol 3 and 4 times a day with minimal relief.  He is requesting something that he can take for pain in his knees until he is able to have the surgery done.  He denies any bleeding or bruising or history of problems with anesthesia.  EKG today shows normal sinus rhythm with normal intervals and a normal axis with no evidence of ischemia or infarction Immunization History  Administered Date(s) Administered  . Influenza,inj,Quad PF,6+ Mos 12/16/2012, 11/03/2014  . Influenza-Unspecified 10/21/2016  . Pneumococcal Conjugate-13 07/01/2014  . Pneumococcal Polysaccharide-23 06/19/2012, 11/30/2019  . Td 06/12/2001  . Tdap 05/18/2013   Past Medical History:  Diagnosis Date  . Adenomatous colon polyp 2004  . Allergy   . Diverticulosis 2011  . Former smoker   . HLD (hyperlipidemia)   . Hyperplastic colon polyp 2004  . Hypertension   . Melanoma in situ [facility])    Past Surgical History:  Procedure Laterality Date  . COLONOSCOPY  2011   SEVERAL    Current Outpatient Medications on File Prior to Visit  Medication Sig Dispense Refill  . atorvastatin (LIPITOR) 40 MG tablet Take 1 tablet (40 mg total) by mouth daily. 90 tablet 3  . cetirizine (ZYRTEC) 10 MG tablet TAKE 1 TABLET BY MOUTH EVERY DAY 90 tablet 3  . losartan (COZAAR) 50 MG tablet TAKE 1  TABLET (50 MG TOTAL) BY MOUTH DAILY. 90 tablet 3  . Misc Natural Products (TART CHERRY ADVANCED PO) Take 1,000 mg by mouth daily.    . tamsulosin (FLOMAX) 0.4 MG CAPS capsule Take 1 capsule (0.4 mg total) by mouth daily. 30 capsule 11  . tiZANidine (ZANAFLEX) 4 MG tablet Take 1 tablet (4 mg total) by mouth every 6 (six) hours as needed for muscle spasms. 30 tablet 1  . Vitamin D, Ergocalciferol, (DRISDOL) 1.25 MG (50000 UNIT) CAPS capsule TAKE 1 CAPSULE (50,000 UNITS TOTAL) BY MOUTH EVERY 7 (SEVEN) DAYS. 12 capsule 0   No current facility-administered medications on file prior to visit.   Allergies  Allergen Reactions  . Levaquin [Levofloxacin In D5w] Shortness Of Breath  . Crestor [Rosuvastatin Calcium] Diarrhea   Social History   Socioeconomic History  . Marital status: Married    Spouse name: Not on file  . Number of children: 2  . Years of education: Not on file  . Highest education level: Not on file  Occupational History  . Occupation: part Scientist, clinical (histocompatibility and immunogenetics): [employer]  Tobacco Use  . Smoking status: Former Smoker    Packs/day: 1.00    Years: 30.00    Pack years: 30.00    Types: Cigarettes, Cigars    Quit date: 05/13/1998    Years since quitting: 21.8  . Smokeless tobacco: Current User    Types: Chew  Substance and Sexual Activity  .  Alcohol use: Yes    Comment: freq  . Drug use: No  . Sexual activity: Not on file  Other Topics Concern  . Not on file  Social History Narrative  . Not on file   Social Determinants of Health   Financial Resource Strain: Low Risk   . Difficulty of Paying Living Expenses: Not very hard  Food Insecurity: Not on file  Transportation Needs: Not on file  Physical Activity: Not on file  Stress: Not on file  Social Connections: Not on file  Intimate Partner Violence: Not on file   Family History  Problem Relation Age of Onset  . Lung cancer Father   . Heart disease Mother        died in her 15's  . Diabetes Daughter       Review of Systems  All other systems reviewed and are negative.      Objective:   Physical Exam Vitals reviewed.  Constitutional:      General: He is not in acute distress.    Appearance: He is well-developed. He is not diaphoretic.  HENT:     Head: Normocephalic and atraumatic.     Right Ear: External ear normal.     Left Ear: External ear normal.     Nose: Nose normal.     Mouth/Throat:     Pharynx: No oropharyngeal exudate.  Eyes:     General: No scleral icterus.       Right eye: No discharge.        Left eye: No discharge.     Conjunctiva/sclera: Conjunctivae normal.     Pupils: Pupils are equal, round, and reactive to light.  Neck:     Thyroid: No thyromegaly.     Vascular: No JVD.     Trachea: No tracheal deviation.  Cardiovascular:     Rate and Rhythm: Normal rate and regular rhythm.     Heart sounds: Normal heart sounds. No murmur heard. No friction rub. No gallop.   Pulmonary:     Effort: Pulmonary effort is normal. No respiratory distress.     Breath sounds: Normal breath sounds. No stridor. No wheezing or rales.  Chest:     Chest wall: No tenderness.  Abdominal:     General: Bowel sounds are normal. There is no distension.     Palpations: Abdomen is soft. There is no mass.     Tenderness: There is no abdominal tenderness. There is no guarding or rebound.  Musculoskeletal:        General: No tenderness or deformity. Normal range of motion.     Cervical back: Normal range of motion and neck supple.  Lymphadenopathy:     Cervical: No cervical adenopathy.  Skin:    General: Skin is warm.     Coloration: Skin is not pale.     Findings: No erythema or rash.  Neurological:     Mental Status: He is alert and oriented to person, place, and time.     Cranial Nerves: No cranial nerve deficit.     Motor: No abnormal muscle tone.     Coordination: Coordination normal.     Deep Tendon Reflexes: Reflexes are normal and symmetric.  Psychiatric:         Behavior: Behavior normal.        Thought Content: Thought content normal.        Judgment: Judgment normal.           Assessment & Plan:  Pre-op evaluation -  Plan: EKG 12-Lead, CBC with Differential/Platelet, COMPLETE METABOLIC PANEL WITH GFR, Protime-INR, CANCELED: Lipid panel, CANCELED: EKG 12-Lead, CANCELED: PT with INR/Fingerstick  Pure hypercholesterolemia - Plan: CBC with Differential/Platelet, COMPLETE METABOLIC PANEL WITH GFR, CANCELED: Lipid panel  Essential hypertension - Plan: CBC with Differential/Platelet, COMPLETE METABOLIC PANEL WITH GFR, CANCELED: Lipid panel  Blood pressure at home has been elevated.  He states that his systolic blood pressure has been consistently in the 150 range.  Therefore of asked him to increase losartan to 100 mg a day.  I will check a CBC, CMP, and PT/INR.  Assuming lab work is normal, the patient is cleared to proceed with his upcoming surgery.  I would also like to recheck his cholesterol but of asked him to come in fasting for this.  I see no contraindications to surgery.

## 2020-03-28 LAB — PROTIME-INR
INR: 1.1
Prothrombin Time: 10.7 s (ref 9.0–11.5)

## 2020-03-28 LAB — COMPLETE METABOLIC PANEL WITH GFR
AG Ratio: 2 (calc) (ref 1.0–2.5)
ALT: 20 U/L (ref 9–46)
AST: 18 U/L (ref 10–35)
Albumin: 4.1 g/dL (ref 3.6–5.1)
Alkaline phosphatase (APISO): 69 U/L (ref 35–144)
BUN/Creatinine Ratio: 19 (calc) (ref 6–22)
BUN: 29 mg/dL — ABNORMAL HIGH (ref 7–25)
CO2: 24 mmol/L (ref 20–32)
Calcium: 9.5 mg/dL (ref 8.6–10.3)
Chloride: 107 mmol/L (ref 98–110)
Creat: 1.49 mg/dL — ABNORMAL HIGH (ref 0.70–1.18)
GFR, Est African American: 54 mL/min/{1.73_m2} — ABNORMAL LOW (ref >=60)
GFR, Est Non African American: 47 mL/min/{1.73_m2} — ABNORMAL LOW (ref >=60)
Globulin: 2.1 g/dL (calc) (ref 1.9–3.7)
Glucose, Bld: 104 mg/dL — ABNORMAL HIGH (ref 65–99)
Potassium: 4.5 mmol/L (ref 3.5–5.3)
Sodium: 139 mmol/L (ref 135–146)
Total Bilirubin: 0.5 mg/dL (ref 0.2–1.2)
Total Protein: 6.2 g/dL (ref 6.1–8.1)

## 2020-03-28 LAB — CBC WITH DIFFERENTIAL/PLATELET
Absolute Monocytes: 1134 cells/uL — ABNORMAL HIGH (ref 200–950)
Basophils Absolute: 74 cells/uL (ref 0–200)
Basophils Relative: 0.7 %
Eosinophils Absolute: 594 cells/uL — ABNORMAL HIGH (ref 15–500)
Eosinophils Relative: 5.6 %
HCT: 42.3 % (ref 38.5–50.0)
Hemoglobin: 13.8 g/dL (ref 13.2–17.1)
Lymphs Abs: 3424 cells/uL (ref 850–3900)
MCH: 28.2 pg (ref 27.0–33.0)
MCHC: 32.6 g/dL (ref 32.0–36.0)
MCV: 86.5 fL (ref 80.0–100.0)
MPV: 10.8 fL (ref 7.5–12.5)
Monocytes Relative: 10.7 %
Neutro Abs: 5374 cells/uL (ref 1500–7800)
Neutrophils Relative %: 50.7 %
Platelets: 318 10*3/uL (ref 140–400)
RBC: 4.89 10*6/uL (ref 4.20–5.80)
RDW: 13.1 % (ref 11.0–15.0)
Total Lymphocyte: 32.3 %
WBC: 10.6 10*3/uL (ref 3.8–10.8)

## 2020-03-28 LAB — LIPID PANEL
Cholesterol: 195 mg/dL (ref <200)
HDL: 30 mg/dL — ABNORMAL LOW (ref >=40)
LDL Cholesterol (Calc): 114 mg/dL (calc) — ABNORMAL HIGH
Non-HDL Cholesterol (Calc): 165 mg/dL (calc) — ABNORMAL HIGH (ref <130)
Total CHOL/HDL Ratio: 6.5 (calc) — ABNORMAL HIGH (ref <5.0)
Triglycerides: 360 mg/dL — ABNORMAL HIGH (ref <150)

## 2020-04-03 ENCOUNTER — Other Ambulatory Visit: Payer: PPO

## 2020-04-03 ENCOUNTER — Other Ambulatory Visit: Payer: Self-pay

## 2020-04-03 DIAGNOSIS — E7800 Pure hypercholesterolemia, unspecified: Secondary | ICD-10-CM

## 2020-04-03 DIAGNOSIS — I10 Essential (primary) hypertension: Secondary | ICD-10-CM | POA: Diagnosis not present

## 2020-04-03 DIAGNOSIS — E785 Hyperlipidemia, unspecified: Secondary | ICD-10-CM | POA: Diagnosis not present

## 2020-04-04 LAB — CBC WITH DIFFERENTIAL/PLATELET
Absolute Monocytes: 1198 cells/uL — ABNORMAL HIGH (ref 200–950)
Basophils Absolute: 68 cells/uL (ref 0–200)
Basophils Relative: 0.6 %
Eosinophils Absolute: 576 cells/uL — ABNORMAL HIGH (ref 15–500)
Eosinophils Relative: 5.1 %
HCT: 42.6 % (ref 38.5–50.0)
Hemoglobin: 14.3 g/dL (ref 13.2–17.1)
Lymphs Abs: 3650 cells/uL (ref 850–3900)
MCH: 29.4 pg (ref 27.0–33.0)
MCHC: 33.6 g/dL (ref 32.0–36.0)
MCV: 87.5 fL (ref 80.0–100.0)
MPV: 11 fL (ref 7.5–12.5)
Monocytes Relative: 10.6 %
Neutro Abs: 5808 cells/uL (ref 1500–7800)
Neutrophils Relative %: 51.4 %
Platelets: 315 10*3/uL (ref 140–400)
RBC: 4.87 10*6/uL (ref 4.20–5.80)
RDW: 12.7 % (ref 11.0–15.0)
Total Lymphocyte: 32.3 %
WBC: 11.3 10*3/uL — ABNORMAL HIGH (ref 3.8–10.8)

## 2020-04-04 LAB — LIPID PANEL
Cholesterol: 184 mg/dL (ref <200)
HDL: 34 mg/dL — ABNORMAL LOW (ref >=40)
LDL Cholesterol (Calc): 118 mg/dL (calc) — ABNORMAL HIGH
Non-HDL Cholesterol (Calc): 150 mg/dL (calc) — ABNORMAL HIGH (ref <130)
Total CHOL/HDL Ratio: 5.4 (calc) — ABNORMAL HIGH (ref <5.0)
Triglycerides: 201 mg/dL — ABNORMAL HIGH (ref <150)

## 2020-04-04 LAB — COMPLETE METABOLIC PANEL WITH GFR
AG Ratio: 1.9 (calc) (ref 1.0–2.5)
ALT: 25 U/L (ref 9–46)
AST: 24 U/L (ref 10–35)
Albumin: 4.3 g/dL (ref 3.6–5.1)
Alkaline phosphatase (APISO): 75 U/L (ref 35–144)
BUN/Creatinine Ratio: 15 (calc) (ref 6–22)
BUN: 20 mg/dL (ref 7–25)
CO2: 25 mmol/L (ref 20–32)
Calcium: 9.6 mg/dL (ref 8.6–10.3)
Chloride: 103 mmol/L (ref 98–110)
Creat: 1.33 mg/dL — ABNORMAL HIGH (ref 0.70–1.18)
GFR, Est African American: 62 mL/min/{1.73_m2} (ref >=60)
GFR, Est Non African American: 53 mL/min/{1.73_m2} — ABNORMAL LOW (ref >=60)
Globulin: 2.3 g/dL (calc) (ref 1.9–3.7)
Glucose, Bld: 87 mg/dL (ref 65–99)
Potassium: 4.6 mmol/L (ref 3.5–5.3)
Sodium: 138 mmol/L (ref 135–146)
Total Bilirubin: 0.7 mg/dL (ref 0.2–1.2)
Total Protein: 6.6 g/dL (ref 6.1–8.1)

## 2020-04-05 ENCOUNTER — Other Ambulatory Visit: Payer: Self-pay | Admitting: *Deleted

## 2020-04-05 ENCOUNTER — Telehealth: Payer: Self-pay | Admitting: *Deleted

## 2020-04-05 DIAGNOSIS — I10 Essential (primary) hypertension: Secondary | ICD-10-CM

## 2020-04-05 MED ORDER — TADALAFIL 5 MG PO TABS: 5.0000 mg | ORAL_TABLET | Freq: Every day | ORAL | 0 refills | Status: DC | PRN

## 2020-04-05 NOTE — Telephone Encounter (Signed)
Received call from patient.   Reports that he has stopped Flomax as he did not see any improvement in frequency of urination. States that he is still going several times within an hour and only voiding around 25- 29mL per time.   Reports that a friend is taking tadalafil 5mg  for BPH and states that is effective. Requested trial of tadalafil.   Please advise.

## 2020-04-05 NOTE — Telephone Encounter (Signed)
Ok with cialis 5 mg poqday

## 2020-04-05 NOTE — Telephone Encounter (Signed)
Medication sent.

## 2020-04-10 NOTE — Telephone Encounter (Signed)
Received call from patient.   States that he has begun Tadalafil for OAB and has noted it effective.   Of note, reports that 30 day supply >$80 at [facility]. Advised that coupon could be used from [facility] to make medicaiton more affordable.

## 2020-04-11 ENCOUNTER — Other Ambulatory Visit: Payer: Self-pay | Admitting: *Deleted

## 2020-04-11 MED ORDER — EZETIMIBE 10 MG PO TABS: 10.0000 mg | ORAL_TABLET | Freq: Every day | ORAL | 3 refills | Status: DC

## 2020-04-17 ENCOUNTER — Telehealth: Payer: Self-pay | Admitting: *Deleted

## 2020-04-17 NOTE — Telephone Encounter (Signed)
Received call from patient.   Reports that he is having increased indigestion. Denies chest pain, radiating pain throughout arm, neck, or jaw. States that he does have SOB, but this is his baseline.   States that he is also having diverticulitis flare at this time. Reports that he has been taking Tums, but has found this ineffective.   Advised to use either Pepcid or Prilosec. Advised to contact office if Sx do not improve.

## 2020-04-18 ENCOUNTER — Telehealth: Payer: Self-pay | Admitting: Family Medicine

## 2020-04-18 DIAGNOSIS — M1711 Unilateral primary osteoarthritis, right knee: Secondary | ICD-10-CM | POA: Diagnosis not present

## 2020-04-18 DIAGNOSIS — M25661 Stiffness of right knee, not elsewhere classified: Secondary | ICD-10-CM | POA: Diagnosis not present

## 2020-04-18 DIAGNOSIS — M25561 Pain in right knee: Secondary | ICD-10-CM | POA: Diagnosis not present

## 2020-04-18 NOTE — Telephone Encounter (Signed)
Received call from Bethena Roys from [facility] orthopedic, office of Dr. Dorna Leitz. Preop paperwork received but office needs actual copies of bloodwork;surgery next week. Results needed asap for anesthesia's review. Fax number: [phone]. Please advise at [phone]

## 2020-04-18 NOTE — Telephone Encounter (Signed)
Faxed labs from 03/27/2020-  04/03/2020.

## 2020-04-26 DIAGNOSIS — M1711 Unilateral primary osteoarthritis, right knee: Secondary | ICD-10-CM | POA: Diagnosis not present

## 2020-04-26 DIAGNOSIS — G8918 Other acute postprocedural pain: Secondary | ICD-10-CM | POA: Diagnosis not present

## 2020-04-26 HISTORY — PX: TOTAL KNEE ARTHROPLASTY: SHX125

## 2020-04-27 DIAGNOSIS — M1712 Unilateral primary osteoarthritis, left knee: Secondary | ICD-10-CM | POA: Diagnosis not present

## 2020-04-27 DIAGNOSIS — Z471 Aftercare following joint replacement surgery: Secondary | ICD-10-CM | POA: Diagnosis not present

## 2020-04-27 DIAGNOSIS — E785 Hyperlipidemia, unspecified: Secondary | ICD-10-CM | POA: Diagnosis not present

## 2020-04-27 DIAGNOSIS — Z96651 Presence of right artificial knee joint: Secondary | ICD-10-CM | POA: Diagnosis not present

## 2020-04-27 DIAGNOSIS — M1711 Unilateral primary osteoarthritis, right knee: Secondary | ICD-10-CM | POA: Diagnosis not present

## 2020-04-27 DIAGNOSIS — Z7982 Long term (current) use of aspirin: Secondary | ICD-10-CM | POA: Diagnosis not present

## 2020-04-27 DIAGNOSIS — I10 Essential (primary) hypertension: Secondary | ICD-10-CM | POA: Diagnosis not present

## 2020-04-27 DIAGNOSIS — Z96652 Presence of left artificial knee joint: Secondary | ICD-10-CM | POA: Diagnosis not present

## 2020-04-27 DIAGNOSIS — F17290 Nicotine dependence, other tobacco product, uncomplicated: Secondary | ICD-10-CM | POA: Diagnosis not present

## 2020-05-04 ENCOUNTER — Other Ambulatory Visit: Payer: Self-pay

## 2020-05-04 DIAGNOSIS — I10 Essential (primary) hypertension: Secondary | ICD-10-CM

## 2020-05-04 MED ORDER — TADALAFIL 5 MG PO TABS: 5.0000 mg | ORAL_TABLET | Freq: Every day | ORAL | 0 refills | Status: DC | PRN

## 2020-05-06 DIAGNOSIS — Z96651 Presence of right artificial knee joint: Secondary | ICD-10-CM | POA: Diagnosis not present

## 2020-05-06 DIAGNOSIS — M1712 Unilateral primary osteoarthritis, left knee: Secondary | ICD-10-CM | POA: Diagnosis not present

## 2020-05-06 DIAGNOSIS — E785 Hyperlipidemia, unspecified: Secondary | ICD-10-CM | POA: Diagnosis not present

## 2020-05-06 DIAGNOSIS — F17290 Nicotine dependence, other tobacco product, uncomplicated: Secondary | ICD-10-CM | POA: Diagnosis not present

## 2020-05-06 DIAGNOSIS — I10 Essential (primary) hypertension: Secondary | ICD-10-CM | POA: Diagnosis not present

## 2020-05-06 DIAGNOSIS — Z471 Aftercare following joint replacement surgery: Secondary | ICD-10-CM | POA: Diagnosis not present

## 2020-05-06 DIAGNOSIS — Z7982 Long term (current) use of aspirin: Secondary | ICD-10-CM | POA: Diagnosis not present

## 2020-05-09 DIAGNOSIS — Z9889 Other specified postprocedural states: Secondary | ICD-10-CM | POA: Diagnosis not present

## 2020-05-09 DIAGNOSIS — R531 Weakness: Secondary | ICD-10-CM | POA: Diagnosis not present

## 2020-05-09 DIAGNOSIS — M25661 Stiffness of right knee, not elsewhere classified: Secondary | ICD-10-CM | POA: Diagnosis not present

## 2020-05-09 DIAGNOSIS — Z96651 Presence of right artificial knee joint: Secondary | ICD-10-CM | POA: Diagnosis not present

## 2020-05-09 DIAGNOSIS — R262 Difficulty in walking, not elsewhere classified: Secondary | ICD-10-CM | POA: Diagnosis not present

## 2020-05-12 ENCOUNTER — Encounter: Payer: Self-pay | Admitting: Nurse Practitioner

## 2020-05-12 ENCOUNTER — Telehealth (INDEPENDENT_AMBULATORY_CARE_PROVIDER_SITE_OTHER): Payer: PPO | Admitting: Nurse Practitioner

## 2020-05-12 DIAGNOSIS — I952 Hypotension due to drugs: Secondary | ICD-10-CM

## 2020-05-12 NOTE — Progress Notes (Signed)
Subjective:    Patient ID: [name], male    DOB: [birth year], 70 y.o.   MRN: [id number]  HPI: [name] is a 70 y.o. male presenting for low blood pressure.  Chief Complaint  Patient presents with  . Medication Problem    Pt had surgery 04/26/20 on knee, given oxy for pain. Feels medication is dropping bp. Has seen systolic as low as 84'X and diastolic as low as 32'G. Wants to discuss medication   HYPOTENSION Patient had surgery 04/26/20 for knee. Pain gets worse at night time, reports "every night when the sun goes down."  Has only been taking oxycodone at night time.  BP drops during the day; at random times throughout the day when he is sitting down usually. He feels dizzy and checks BP - 60-80s/40s.  Started this week and has happened 2-3 times.  Drinks water and eats saltines crackers and blood pressure returns to normal. Hypertension status: controlled  Satisfied with current treatment? yes BP monitoring frequency:  multiple times daily BP range: 140/84 BP medication side effects:  no Medication compliance: excellent Aspirin: no Recurrent headaches: no Visual changes: no Palpitations: no Dyspnea: no Chest pain: no Lower extremity edema: no Dizzy/lightheaded: yes Falls: no Diarrhea: no Bleeding: no  Allergies  Allergen Reactions  . Levaquin [Levofloxacin In D5w] Shortness Of Breath  . Crestor [Rosuvastatin Calcium] Diarrhea    Outpatient Encounter Medications as of 05/12/2020  Medication Sig  . cetirizine (ZYRTEC) 10 MG tablet TAKE 1 TABLET BY MOUTH EVERY DAY  . ezetimibe (ZETIA) 10 MG tablet Take 1 tablet (10 mg total) by mouth daily.  . Misc Natural Products (TART CHERRY ADVANCED PO) Take 1,000 mg by mouth daily.  . tadalafil (CIALIS) 5 MG tablet Take 1 tablet (5 mg total) by mouth daily as needed for erectile dysfunction.  Marland Kitchen tiZANidine (ZANAFLEX) 4 MG tablet Take 1 tablet (4 mg total) by mouth every 6 (six) hours as needed for muscle spasms.  . Vitamin D,  Ergocalciferol, (DRISDOL) 1.25 MG (50000 UNIT) CAPS capsule TAKE 1 CAPSULE (50,000 UNITS TOTAL) BY MOUTH EVERY 7 (SEVEN) DAYS.  . [DISCONTINUED] losartan (COZAAR) 100 MG tablet Take 1 tablet (100 mg total) by mouth daily.  . CELEBREX 100 MG capsule Take 100 mg by mouth 2 (two) times daily.  Marland Kitchen losartan (COZAAR) 50 MG tablet Take 1 tablet by mouth daily.  Marland Kitchen neomycin-polymyxin b-dexamethasone (MAXITROL) 3.5-10000-0.1 SUSP Place 1 drop into the right eye 3 (three) times daily.  Marland Kitchen omeprazole (PRILOSEC) 20 MG capsule Take 1 capsule by mouth every morning.  Marland Kitchen oxyCODONE (OXY IR/ROXICODONE) 5 MG immediate release tablet Take 5-10 mg by mouth every 4 (four) hours as needed.   No facility-administered encounter medications on file as of 05/12/2020.    Patient Active Problem List   Diagnosis Date Noted  . Left hamstring injury 09/13/2019  . Low back pain radiating to right leg 06/15/2019  . Posterior tibial tendinitis, left 12/04/2018  . Bilateral leg pain 05/21/2017  . Degenerative arthritis of knee, bilateral 02/18/2017  . Bilateral knee pain 07/18/2016  . Right foot pain 04/16/2016  . LLQ abdominal pain 06/10/2013  . Melanoma in situ ([place])   . Shortness of breath 05/25/2012  . Allergic rhinitis 05/25/2012  . Diverticulosis   . Former smoker   . HLD (hyperlipidemia)   . Allergy   . ABDOMINAL PAIN, LEFT LOWER QUADRANT 10/23/2009  . GERD 03/31/2007  . DIVERTICULITIS, ACUTE 03/31/2007  . SINUSITIS- ACUTE-NOS 02/18/2007  .  URI 02/18/2007  . ERECTILE DYSFUNCTION 10/29/2006  . TOBACCO ABUSE 10/29/2006  . HYPERLIPIDEMIA 10/01/2006  . Essential hypertension 10/01/2006  . DIVERTICULOSIS, COLON 10/01/2006    Past Medical History:  Diagnosis Date  . Adenomatous colon polyp 2004  . Allergy   . Diverticulosis 2011  . Former smoker   . HLD (hyperlipidemia)   . Hyperplastic colon polyp 2004  . Hypertension   . Melanoma in situ [facility])     Relevant past medical, surgical, family and social  history reviewed and updated as indicated. Interim medical history since our last visit reviewed.  Review of Systems Per HPI unless specifically indicated above     Objective:    There were no vitals taken for this visit.  Wt Readings from Last 3 Encounters:  03/27/20 225 lb (102.1 kg)  02/08/20 222 lb (100.7 kg)  12/28/19 222 lb (100.7 kg)    Physical Exam Physical examination unable to be performed due to lack of equipment.      Assessment & Plan:  1. Hypotension due to drug Acute.  Unclear etiology.  No red flags in history today.  Encouraged patient to cut pain medication in half and monitor for resolution of low blood pressure.  Also encouraged cutting losartan in half, perhaps we are over treating blood pressure.  If symptoms do not improve over weekend, return to clinic early next week for in-person evaluation.  Of note, EKG and blood work prior to surgery in March were stable.  With any sudden onset new chest pain, sweating, or shortness of breath associated with dizziness, go to ED.  Follow up plan: Return in about 3 days (around 05/15/2020) for if no improvement.  This visit was completed via telephone due to the restrictions of the COVID-19 pandemic. All issues as above were discussed and addressed but no physical exam was performed. If it was felt that the patient should be evaluated in the office, they were directed there. The patient verbally consented to this visit. Patient was unable to complete an audio/visual visit due to Technical difficulties. . Location of the patient: home . Location of the provider: work . Those involved with this call:  . Provider: Noemi Chapel, DNP, FNP-C . CMA: Annabelle Harman, CMA . Front Desk/Registration: n/a  . Time spent on call: 15 minutes on the phone discussing health concerns. 20 minutes total spent in review of patient's record and preparation of their chart.  I verified patient identity using two factors (patient name and date of  birth). Patient consents verbally to being seen via telemedicine visit today.

## 2020-05-17 DIAGNOSIS — Z96651 Presence of right artificial knee joint: Secondary | ICD-10-CM | POA: Diagnosis not present

## 2020-05-17 DIAGNOSIS — R531 Weakness: Secondary | ICD-10-CM | POA: Diagnosis not present

## 2020-05-17 DIAGNOSIS — M25661 Stiffness of right knee, not elsewhere classified: Secondary | ICD-10-CM | POA: Diagnosis not present

## 2020-05-17 DIAGNOSIS — R262 Difficulty in walking, not elsewhere classified: Secondary | ICD-10-CM | POA: Diagnosis not present

## 2020-05-22 DIAGNOSIS — R262 Difficulty in walking, not elsewhere classified: Secondary | ICD-10-CM | POA: Diagnosis not present

## 2020-05-22 DIAGNOSIS — M25661 Stiffness of right knee, not elsewhere classified: Secondary | ICD-10-CM | POA: Diagnosis not present

## 2020-05-22 DIAGNOSIS — Z96651 Presence of right artificial knee joint: Secondary | ICD-10-CM | POA: Diagnosis not present

## 2020-05-22 DIAGNOSIS — R531 Weakness: Secondary | ICD-10-CM | POA: Diagnosis not present

## 2020-05-24 DIAGNOSIS — Z96651 Presence of right artificial knee joint: Secondary | ICD-10-CM | POA: Diagnosis not present

## 2020-05-24 DIAGNOSIS — M25661 Stiffness of right knee, not elsewhere classified: Secondary | ICD-10-CM | POA: Diagnosis not present

## 2020-05-24 DIAGNOSIS — R262 Difficulty in walking, not elsewhere classified: Secondary | ICD-10-CM | POA: Diagnosis not present

## 2020-05-24 DIAGNOSIS — R531 Weakness: Secondary | ICD-10-CM | POA: Diagnosis not present

## 2020-05-25 ENCOUNTER — Other Ambulatory Visit: Payer: Self-pay

## 2020-05-25 DIAGNOSIS — I10 Essential (primary) hypertension: Secondary | ICD-10-CM

## 2020-05-25 MED ORDER — TADALAFIL 5 MG PO TABS
5.0000 mg | ORAL_TABLET | Freq: Every day | ORAL | 0 refills | Status: DC | PRN
Start: 2020-05-25 — End: 2020-05-31

## 2020-05-29 DIAGNOSIS — R262 Difficulty in walking, not elsewhere classified: Secondary | ICD-10-CM | POA: Diagnosis not present

## 2020-05-29 DIAGNOSIS — M25661 Stiffness of right knee, not elsewhere classified: Secondary | ICD-10-CM | POA: Diagnosis not present

## 2020-05-29 DIAGNOSIS — Z96651 Presence of right artificial knee joint: Secondary | ICD-10-CM | POA: Diagnosis not present

## 2020-05-29 DIAGNOSIS — R531 Weakness: Secondary | ICD-10-CM | POA: Diagnosis not present

## 2020-05-31 ENCOUNTER — Other Ambulatory Visit: Payer: Self-pay | Admitting: *Deleted

## 2020-05-31 DIAGNOSIS — I10 Essential (primary) hypertension: Secondary | ICD-10-CM

## 2020-05-31 MED ORDER — TADALAFIL 5 MG PO TABS: 5.0000 mg | ORAL_TABLET | Freq: Every day | ORAL | 0 refills | Status: DC | PRN

## 2020-06-05 DIAGNOSIS — Z471 Aftercare following joint replacement surgery: Secondary | ICD-10-CM | POA: Diagnosis not present

## 2020-06-05 DIAGNOSIS — Z9889 Other specified postprocedural states: Secondary | ICD-10-CM | POA: Diagnosis not present

## 2020-06-05 DIAGNOSIS — M25661 Stiffness of right knee, not elsewhere classified: Secondary | ICD-10-CM | POA: Diagnosis not present

## 2020-06-05 DIAGNOSIS — R531 Weakness: Secondary | ICD-10-CM | POA: Diagnosis not present

## 2020-06-05 DIAGNOSIS — Z96651 Presence of right artificial knee joint: Secondary | ICD-10-CM | POA: Diagnosis not present

## 2020-06-05 DIAGNOSIS — R262 Difficulty in walking, not elsewhere classified: Secondary | ICD-10-CM | POA: Diagnosis not present

## 2020-06-05 NOTE — Progress Notes (Signed)
Chronic Care Management Pharmacy Note  06/08/2020 Name:  KAIVEN VESTER MRN:  [id number] DOB:  22-Dec-1950  Subjective: [name] is an 70 y.o. year old male who is a primary patient of Pickard, Cammie Mcgee, MD.  The CCM team was consulted for assistance with disease management and care coordination needs.    Engaged with patient by telephone for follow up visit in response to provider referral for pharmacy case management and/or care coordination services.   Consent to Services:  The patient was given the following information about Chronic Care Management services today, agreed to services, and gave verbal consent: 1. CCM service includes personalized support from designated clinical staff supervised by the primary care provider, including individualized plan of care and coordination with other care providers 2. 24/7 contact phone numbers for assistance for urgent and routine care needs. 3. Service will only be billed when office clinical staff spend 20 minutes or more in a month to coordinate care. 4. Only one practitioner may furnish and bill the service in a calendar month. 5.The patient may stop CCM services at any time (effective at the end of the month) by phone call to the office staff. 6. The patient will be responsible for cost sharing (co-pay) of up to 20% of the service fee (after annual deductible is met). Patient agreed to services and consent obtained.  Patient Care Team: Susy Frizzle, MD as PCP - General (Family Medicine) Edythe Clarity, [facility] as Pharmacist (Pharmacist)  Recent office visits: 05/12/20 Edsel Petrin) - Losartan reduced to $RemoveBe'50mg'nrdsYxnvF$  daily.  Also advised patient to reduce his pain medication in half.  Recent consult visits: None recent  Hospital visits: None in previous 6 months  Objective:  Lab Results  Component Value Date   CREATININE 1.33 (H) 04/03/2020   BUN 20 04/03/2020   GFRNONAA 53 (L) 04/03/2020   GFRAA 62 04/03/2020   NA 138 04/03/2020   K  4.6 04/03/2020   CALCIUM 9.6 04/03/2020   CO2 25 04/03/2020   GLUCOSE 87 04/03/2020    No results found for: HGBA1C, FRUCTOSAMINE, GFR, MICROALBUR  Last diabetic Eye exam: No results found for: HMDIABEYEEXA  Last diabetic Foot exam: No results found for: HMDIABFOOTEX   Lab Results  Component Value Date   CHOL 184 04/03/2020   HDL 34 (L) 04/03/2020   LDLCALC 118 (H) 04/03/2020   TRIG 201 (H) 04/03/2020   CHOLHDL 5.4 (H) 04/03/2020    Hepatic Function Latest Ref Rng & Units 04/03/2020 03/27/2020 11/16/2019  Total Protein 6.1 - 8.1 g/dL 6.6 6.2 6.7  Albumin 3.6 - 5.1 g/dL - - -  AST 10 - 35 U/L $Remo'24 18 27  'uFvVz$ ALT 9 - 46 U/L $Remo'25 20 30  'pHMJu$ Alk Phosphatase 40 - 115 U/L - - -  Total Bilirubin 0.2 - 1.2 mg/dL 0.7 0.5 0.6    Lab Results  Component Value Date/Time   TSH 3.38 10/06/2015 02:29 PM   TSH 3.574 01/03/2015 04:14 PM    CBC Latest Ref Rng & Units 04/03/2020 03/27/2020 11/16/2019  WBC 3.8 - 10.8 Thousand/uL 11.3(H) 10.6 9.4  Hemoglobin 13.2 - 17.1 g/dL 14.3 13.8 14.5  Hematocrit 38.5 - 50.0 % 42.6 42.3 43.5  Platelets 140 - 400 Thousand/uL 315 318 326    No results found for: VD25OH  Clinical ASCVD: No  The 10-year ASCVD risk score [name], et al., 2013) is: 23.2%   Values used to calculate the score:     Age: 59 years  Sex: Male     Is Non-Hispanic African American: No     Diabetic: No     Tobacco smoker: No     Systolic Blood Pressure: 122 mmHg     Is BP treated: Yes     HDL Cholesterol: 34 mg/dL     Total Cholesterol: 184 mg/dL    Depression screen [facility] 2/9 11/16/2019 11/27/2017 03/20/2017  Decreased Interest 0 0 0  Down, Depressed, Hopeless 0 0 0  PHQ - 2 Score 0 0 0  Altered sleeping - - -  Tired, decreased energy - - -  Change in appetite - - -  Feeling bad or failure about yourself  - - -  Trouble concentrating - - -  Moving slowly or fidgety/restless - - -  Suicidal thoughts - - -  PHQ-9 Score - - -  Difficult doing work/chores - - -      Social  History   Tobacco Use  Smoking Status Former Smoker  . Packs/day: 1.00  . Years: 30.00  . Pack years: 30.00  . Types: Cigarettes, Cigars  . Quit date: 05/13/1998  . Years since quitting: 22.0  Smokeless Tobacco Current User  . Types: Chew   BP Readings from Last 3 Encounters:  03/27/20 122/76  02/08/20 140/82  12/28/19 130/70   Pulse Readings from Last 3 Encounters:  03/27/20 74  02/08/20 75  12/28/19 72   Wt Readings from Last 3 Encounters:  03/27/20 225 lb (102.1 kg)  02/08/20 222 lb (100.7 kg)  12/28/19 222 lb (100.7 kg)   BMI Readings from Last 3 Encounters:  03/27/20 29.69 kg/m  02/08/20 29.29 kg/m  12/28/19 29.29 kg/m    Assessment/Interventions: Review of patient past medical history, allergies, medications, health status, including review of consultants reports, laboratory and other test data, was performed as part of comprehensive evaluation and provision of chronic care management services.   SDOH:  (Social Determinants of Health) assessments and interventions performed: Yes   Financial Resource Strain: Low Risk   . Difficulty of Paying Living Expenses: Not very hard    SDOH Screenings   Alcohol Screen: Not on file  Depression (PHQ2-9): Low Risk   . PHQ-2 Score: 0  Financial Resource Strain: Low Risk   . Difficulty of Paying Living Expenses: Not very hard  Food Insecurity: Not on file  Housing: Not on file  Physical Activity: Not on file  Social Connections: Not on file  Stress: Not on file  Tobacco Use: High Risk  . Smoking Tobacco Use: Former Smoker  . Smokeless Tobacco Use: Current User  Transportation Needs: Not on file    CCM Care Plan  Allergies  Allergen Reactions  . Levaquin [Levofloxacin In D5w] Shortness Of Breath  . Crestor [Rosuvastatin Calcium] Diarrhea    Medications Reviewed Today    Reviewed by Erroll Luna, [facility] (Pharmacist) on 06/08/20 at 0846  Med List Status: <None>  Medication Order Taking? Sig Documenting  Provider Last Dose Status Informant  CELEBREX 100 MG capsule [id number] Yes Take 100 mg by mouth 2 (two) times daily. [provider] Taking Active   cetirizine (ZYRTEC) 10 MG tablet [id number] Yes TAKE 1 TABLET BY MOUTH EVERY DAY [name], MD Taking Active   ezetimibe (ZETIA) 10 MG tablet [id number] Yes Take 1 tablet (10 mg total) by mouth daily. [name], MD Taking Active   losartan (COZAAR) 50 MG tablet [id number] Yes Take 1 tablet by mouth daily. [provider] Taking Active  Misc Natural Products (TART CHERRY ADVANCED PO) [id number] Yes Take 1,000 mg by mouth daily. [provider] Taking Active   neomycin-polymyxin b-dexamethasone (MAXITROL) 3.5-10000-0.1 SUSP [id number] Yes Place 1 drop into the right eye 3 (three) times daily. [provider] Taking Active   omeprazole (PRILOSEC) 20 MG capsule [id number] Yes Take 1 capsule by mouth every morning. [provider] Taking Active   oxyCODONE (OXY IR/ROXICODONE) 5 MG immediate release tablet [id number] Yes Take 5-10 mg by mouth every 4 (four) hours as needed. [provider] Taking Active   tadalafil (CIALIS) 5 MG tablet [id number] Yes Take 1 tablet (5 mg total) by mouth daily as needed for erectile dysfunction. Susy Frizzle, MD Taking Active   tiZANidine (ZANAFLEX) 4 MG tablet [id number] Yes Take 1 tablet (4 mg total) by mouth every 6 (six) hours as needed for muscle spasms. Gregor Hams, MD Taking Active   Vitamin D, Ergocalciferol, (DRISDOL) 1.25 MG (50000 UNIT) CAPS capsule [id number] Yes TAKE 1 CAPSULE (50,000 UNITS TOTAL) BY MOUTH EVERY 7 (SEVEN) DAYS. Lyndal Pulley, DO Taking Active           Patient Active Problem List   Diagnosis Date Noted  . Left hamstring injury 09/13/2019  . Low back pain radiating to right leg 06/15/2019  . Posterior tibial tendinitis, left 12/04/2018  . Bilateral leg pain 05/21/2017  . Degenerative arthritis of knee, bilateral 02/18/2017   . Bilateral knee pain 07/18/2016  . Right foot pain 04/16/2016  . LLQ abdominal pain 06/10/2013  . Melanoma in situ ([place])   . Shortness of breath 05/25/2012  . Allergic rhinitis 05/25/2012  . Diverticulosis   . Former smoker   . HLD (hyperlipidemia)   . Allergy   . ABDOMINAL PAIN, LEFT LOWER QUADRANT 10/23/2009  . GERD 03/31/2007  . DIVERTICULITIS, ACUTE 03/31/2007  . SINUSITIS- ACUTE-NOS 02/18/2007  . URI 02/18/2007  . ERECTILE DYSFUNCTION 10/29/2006  . TOBACCO ABUSE 10/29/2006  . HYPERLIPIDEMIA 10/01/2006  . Essential hypertension 10/01/2006  . DIVERTICULOSIS, COLON 10/01/2006    Immunization History  Administered Date(s) Administered  . Influenza,inj,Quad PF,6+ Mos 12/16/2012, 11/03/2014  . Influenza-Unspecified 10/21/2016  . Pneumococcal Conjugate-13 07/01/2014  . Pneumococcal Polysaccharide-23 06/19/2012, 11/30/2019  . Td 06/12/2001  . Tdap 05/18/2013    Conditions to be addressed/monitored:  Hypertension and Hyperlipidemia  Care Plan : General Pharmacy (Adult)  Updates made by Edythe Clarity, RPH since 06/08/2020 12:00 AM    Problem: HTN, HLD   Priority: High  Onset Date: 06/08/2020    Long-Range Goal: Patient-Specific Goal   Start Date: 06/07/2020  Expected End Date: 12/09/2020  This Visit's Progress: On track  Priority: High  Note:   Current Barriers:  . Unable to achieve control of cholesterol   Pharmacist Clinical Goal(s):  Marland Kitchen Patient will achieve adherence to monitoring guidelines and medication adherence to achieve therapeutic efficacy . achieve control of cholesterol as evidenced by lipid panel . adhere to prescribed medication regimen as evidenced by fill dates . contact provider office for questions/concerns as evidenced notation of same in electronic health record through collaboration with PharmD and provider.   Interventions: . 1:1 collaboration with Susy Frizzle, MD regarding development and update of comprehensive plan of care as  evidenced by provider attestation and co-signature . Inter-disciplinary care team collaboration (see longitudinal plan of care) . Comprehensive medication review performed; medication list updated in electronic medical record  Hypertension (BP goal <140/90) -Controlled -Current treatment: . Losartan $RemoveBe'50mg'RcroibcjB$  -Medications previously tried: Metoprolol XL  -  Current home readings: 140-150/70-80  -Denies hypotensive/hypertensive symptoms -Educated on BP goals and benefits of medications for prevention of heart attack, stroke and kidney damage; Exercise goal of 150 minutes per week; Importance of home blood pressure monitoring; Symptoms of hypotension and importance of maintaining adequate hydration; -Counseled to monitor BP at home daily, document, and provide log at future appointments -Reports he has bouts of dizziness after surgery when he was on some of the medication for post op recovery.  This made him have lower BP so he decreased back to $Remov'50mg'CkRGEk$  of losartan.  Currently BP is right around 140/80s.  He continues to monitor daily.   -Recommended to continue current medication Recommended he continue to monitor and contact providers if BP is consistently > 140/90.  At that time would recommend he increase back to previous dose of $Remov'100mg'jChHGC$ .  Will continue to follow.  BP monitoring plan initiated/continued.  Hyperlipidemia: (LDL goal < 100) -Not ideally controlled -Current treatment: . Zetia $Remov'10mg'BxVlFJ$  daily -Medications previously tried: Atorvastatin (aches)  -Educated on Cholesterol goals;  Importance of limiting foods high in cholesterol; Exercise goal of 150 minutes per week;  -Patient has completely d/c statin at this time.  He could not tolerate the myalgias.  Has been on Zetia for about 2 months and reports no issues so far.  -Recommended to continue current medication Recommended he continue lifestyle changes including dietary fats and cholesterol.  Encouraged continued adherence to medication.   Recommend follow up lipid panel to assess efficacy.   Patient Goals/Self-Care Activities . Patient will:  - take medications as prescribed focus on medication adherence by pill counts check blood pressure daily, document, and provide at future appointments target a minimum of 150 minutes of moderate intensity exercise weekly  Follow Up Plan: The care management team will reach out to the patient again over the next 120 days.         Medication Assistance: None required.  Patient affirms current coverage meets needs.  Patient's preferred pharmacy is:  [id number] - [place], [place] - [address] [place] [address] Phone: [phone] Fax: [phone]  [id number] - , [place] - [address] [facility] [place] [address] [place] [address] Phone: [phone] Fax: [phone]  [facility] [place], [place] - [address] [place] [address] Phone: [phone] Fax: [phone]  Uses pill box? No - just vials of meds Pt endorses 100% compliance  We discussed: Benefits of medication synchronization, packaging and delivery as well as enhanced pharmacist oversight with [employer]. Patient decided to: Continue current medication management strategy  Care Plan and Follow Up Patient Decision:  Patient agrees to Care Plan and Follow-up.  Plan: The care management team will reach out to the patient again over the next 120 days.  Beverly Milch, PharmD Clinical Pharmacist [facility] [phone]

## 2020-06-07 ENCOUNTER — Ambulatory Visit (INDEPENDENT_AMBULATORY_CARE_PROVIDER_SITE_OTHER): Payer: PPO | Admitting: Pharmacist

## 2020-06-07 ENCOUNTER — Other Ambulatory Visit: Payer: Self-pay

## 2020-06-07 DIAGNOSIS — I10 Essential (primary) hypertension: Secondary | ICD-10-CM | POA: Diagnosis not present

## 2020-06-07 DIAGNOSIS — E7800 Pure hypercholesterolemia, unspecified: Secondary | ICD-10-CM | POA: Diagnosis not present

## 2020-06-07 DIAGNOSIS — R531 Weakness: Secondary | ICD-10-CM | POA: Diagnosis not present

## 2020-06-07 DIAGNOSIS — Z96651 Presence of right artificial knee joint: Secondary | ICD-10-CM | POA: Diagnosis not present

## 2020-06-07 DIAGNOSIS — M25661 Stiffness of right knee, not elsewhere classified: Secondary | ICD-10-CM | POA: Diagnosis not present

## 2020-06-07 DIAGNOSIS — R262 Difficulty in walking, not elsewhere classified: Secondary | ICD-10-CM | POA: Diagnosis not present

## 2020-06-08 NOTE — Patient Instructions (Addendum)
Visit Information  Goals Addressed            This Visit's Progress   . Track and Manage My Blood Pressure-Hypertension       Timeframe:  Long-Range Goal Priority:  High Start Date: 06/08/2020                             Expected End Date: 12/09/20                       Follow Up Date 08/20/20    - check blood pressure daily - choose a place to take my blood pressure (home, clinic or office, retail store) - write blood pressure results in a log or diary    Why is this important?    You won't feel high blood pressure, but it can still hurt your blood vessels.   High blood pressure can cause heart or kidney problems. It can also cause a stroke.   Making lifestyle changes like losing a little weight or eating less salt will help.   Checking your blood pressure at home and at different times of the day can help to control blood pressure.   If the doctor prescribes medicine remember to take it the way the doctor ordered.   Call the office if you cannot afford the medicine or if there are questions about it.     Notes: Goal < 140/90      Patient Care Plan: General Pharmacy (Adult)    Problem Identified: HTN, HLD   Priority: High  Onset Date: 06/08/2020    Long-Range Goal: Patient-Specific Goal   Start Date: 06/07/2020  Expected End Date: 12/09/2020  This Visit's Progress: On track  Priority: High  Note:   Current Barriers:  . Unable to achieve control of cholesterol   Pharmacist Clinical Goal(s):  Marland Kitchen Patient will achieve adherence to monitoring guidelines and medication adherence to achieve therapeutic efficacy . achieve control of cholesterol as evidenced by lipid panel . adhere to prescribed medication regimen as evidenced by fill dates . contact provider office for questions/concerns as evidenced notation of same in electronic health record through collaboration with PharmD and provider.   Interventions: . 1:1 collaboration with Susy Frizzle, MD regarding  development and update of comprehensive plan of care as evidenced by provider attestation and co-signature . Inter-disciplinary care team collaboration (see longitudinal plan of care) . Comprehensive medication review performed; medication list updated in electronic medical record  Hypertension (BP goal <140/90) -Controlled -Current treatment: . Losartan 50mg  -Medications previously tried: Metoprolol XL  -Current home readings: 140-150/70-80  -Denies hypotensive/hypertensive symptoms -Educated on BP goals and benefits of medications for prevention of heart attack, stroke and kidney damage; Exercise goal of 150 minutes per week; Importance of home blood pressure monitoring; Symptoms of hypotension and importance of maintaining adequate hydration; -Counseled to monitor BP at home daily, document, and provide log at future appointments -Reports he has bouts of dizziness after surgery when he was on some of the medication for post op recovery.  This made him have lower BP so he decreased back to 50mg  of losartan.  Currently BP is right around 140/80s.  He continues to monitor daily.   -Recommended to continue current medication Recommended he continue to monitor and contact providers if BP is consistently > 140/90.  At that time would recommend he increase back to previous dose of 100mg .  Will continue to follow.  BP monitoring plan initiated/continued.  Hyperlipidemia: (LDL goal < 100) -Not ideally controlled -Current treatment: . Zetia 10mg  daily -Medications previously tried: Atorvastatin (aches)  -Educated on Cholesterol goals;  Importance of limiting foods high in cholesterol; Exercise goal of 150 minutes per week;  -Patient has completely d/c statin at this time.  He could not tolerate the myalgias.  Has been on Zetia for about 2 months and reports no issues so far.  -Recommended to continue current medication Recommended he continue lifestyle changes including dietary fats and  cholesterol.  Encouraged continued adherence to medication.  Recommend follow up lipid panel to assess efficacy.   Patient Goals/Self-Care Activities . Patient will:  - take medications as prescribed focus on medication adherence by pill counts check blood pressure daily, document, and provide at future appointments target a minimum of 150 minutes of moderate intensity exercise weekly  Follow Up Plan: The care management team will reach out to the patient again over the next 120 days.         The patient verbalized understanding of instructions, educational materials, and care plan provided today and agreed to receive a mailed copy of patient instructions, educational materials, and care plan.  Telephone follow up appointment with pharmacy team member scheduled for: 4 months  Edythe Clarity, [facility]  Hypertension, Adult High blood pressure (hypertension) is when the force of blood pumping through the arteries is too strong. The arteries are the blood vessels that carry blood from the heart throughout the body. Hypertension forces the heart to work harder to pump blood and may cause arteries to become narrow or stiff. Untreated or uncontrolled hypertension can cause a heart attack, heart failure, a stroke, kidney disease, and other problems. A blood pressure reading consists of a higher number over a lower number. Ideally, your blood pressure should be below 120/80. The first ("top") number is called the systolic pressure. It is a measure of the pressure in your arteries as your heart beats. The second ("bottom") number is called the diastolic pressure. It is a measure of the pressure in your arteries as the heart relaxes. What are the causes? The exact cause of this condition is not known. There are some conditions that result in or are related to high blood pressure. What increases the risk? Some risk factors for high blood pressure are under your control. The following factors may make  you more likely to develop this condition:  Smoking.  Having type 2 diabetes mellitus, high cholesterol, or both.  Not getting enough exercise or physical activity.  Being overweight.  Having too much fat, sugar, calories, or salt (sodium) in your diet.  Drinking too much alcohol. Some risk factors for high blood pressure may be difficult or impossible to change. Some of these factors include:  Having chronic kidney disease.  Having a family history of high blood pressure.  Age. Risk increases with age.  Race. You may be at higher risk if you are African American.  Gender. Men are at higher risk than women before age 65. After age 15, women are at higher risk than men.  Having obstructive sleep apnea.  Stress. What are the signs or symptoms? High blood pressure may not cause symptoms. Very high blood pressure (hypertensive crisis) may cause:  Headache.  Anxiety.  Shortness of breath.  Nosebleed.  Nausea and vomiting.  Vision changes.  Severe chest pain.  Seizures. How is this diagnosed? This condition is diagnosed by measuring your blood pressure while you are seated,  with your arm resting on a flat surface, your legs uncrossed, and your feet flat on the floor. The cuff of the blood pressure monitor will be placed directly against the skin of your upper arm at the level of your heart. It should be measured at least twice using the same arm. Certain conditions can cause a difference in blood pressure between your right and left arms. Certain factors can cause blood pressure readings to be lower or higher than normal for a short period of time:  When your blood pressure is higher when you are in a health care provider's office than when you are at home, this is called white coat hypertension. Most people with this condition do not need medicines.  When your blood pressure is higher at home than when you are in a health care provider's office, this is called masked  hypertension. Most people with this condition may need medicines to control blood pressure. If you have a high blood pressure reading during one visit or you have normal blood pressure with other risk factors, you may be asked to:  Return on a different day to have your blood pressure checked again.  Monitor your blood pressure at home for 1 week or longer. If you are diagnosed with hypertension, you may have other blood or imaging tests to help your health care provider understand your overall risk for other conditions. How is this treated? This condition is treated by making healthy lifestyle changes, such as eating healthy foods, exercising more, and reducing your alcohol intake. Your health care provider may prescribe medicine if lifestyle changes are not enough to get your blood pressure under control, and if:  Your systolic blood pressure is above 130.  Your diastolic blood pressure is above 80. Your personal target blood pressure may vary depending on your medical conditions, your age, and other factors. Follow these instructions at home: Eating and drinking  Eat a diet that is high in fiber and potassium, and low in sodium, added sugar, and fat. An example eating plan is called the DASH (Dietary Approaches to Stop Hypertension) diet. To eat this way: ? Eat plenty of fresh fruits and vegetables. Try to fill one half of your plate at each meal with fruits and vegetables. ? Eat whole grains, such as whole-wheat pasta, brown rice, or whole-grain bread. Fill about one fourth of your plate with whole grains. ? Eat or drink low-fat dairy products, such as skim milk or low-fat yogurt. ? Avoid fatty cuts of meat, processed or cured meats, and poultry with skin. Fill about one fourth of your plate with lean proteins, such as fish, chicken without skin, beans, eggs, or tofu. ? Avoid pre-made and processed foods. These tend to be higher in sodium, added sugar, and fat.  Reduce your daily sodium  intake. Most people with hypertension should eat less than 1,500 mg of sodium a day.  Do not drink alcohol if: ? Your health care provider tells you not to drink. ? You are pregnant, may be pregnant, or are planning to become pregnant.  If you drink alcohol: ? Limit how much you use to:  0-1 drink a day for women.  0-2 drinks a day for men. ? Be aware of how much alcohol is in your drink. In the [place] one drink equals one 12 oz bottle of beer (355 mL), one 5 oz glass of wine (148 mL), or one 1 oz glass of hard liquor (44 mL).   Lifestyle  Work  with your health care provider to maintain a healthy body weight or to lose weight. Ask what an ideal weight is for you.  Get at least 30 minutes of exercise most days of the week. Activities may include walking, swimming, or biking.  Include exercise to strengthen your muscles (resistance exercise), such as Pilates or lifting weights, as part of your weekly exercise routine. Try to do these types of exercises for 30 minutes at least 3 days a week.  Do not use any products that contain nicotine or tobacco, such as cigarettes, e-cigarettes, and chewing tobacco. If you need help quitting, ask your health care provider.  Monitor your blood pressure at home as told by your health care provider.  Keep all follow-up visits as told by your health care provider. This is important.   Medicines  Take over-the-counter and prescription medicines only as told by your health care provider. Follow directions carefully. Blood pressure medicines must be taken as prescribed.  Do not skip doses of blood pressure medicine. Doing this puts you at risk for problems and can make the medicine less effective.  Ask your health care provider about side effects or reactions to medicines that you should watch for. Contact a health care provider if you:  Think you are having a reaction to a medicine you are taking.  Have headaches that keep coming back  (recurring).  Feel dizzy.  Have swelling in your ankles.  Have trouble with your vision. Get help right away if you:  Develop a severe headache or confusion.  Have unusual weakness or numbness.  Feel faint.  Have severe pain in your chest or abdomen.  Vomit repeatedly.  Have trouble breathing. Summary  Hypertension is when the force of blood pumping through your arteries is too strong. If this condition is not controlled, it may put you at risk for serious complications.  Your personal target blood pressure may vary depending on your medical conditions, your age, and other factors. For most people, a normal blood pressure is less than 120/80.  Hypertension is treated with lifestyle changes, medicines, or a combination of both. Lifestyle changes include losing weight, eating a healthy, low-sodium diet, exercising more, and limiting alcohol. This information is not intended to replace advice given to you by your health care provider. Make sure you discuss any questions you have with your health care provider. Document Revised: 09/17/2017 Document Reviewed: 09/17/2017 Elsevier Patient Education  2021 [employer].

## 2020-06-14 DIAGNOSIS — M25661 Stiffness of right knee, not elsewhere classified: Secondary | ICD-10-CM | POA: Diagnosis not present

## 2020-06-14 DIAGNOSIS — Z96651 Presence of right artificial knee joint: Secondary | ICD-10-CM | POA: Diagnosis not present

## 2020-06-14 DIAGNOSIS — R531 Weakness: Secondary | ICD-10-CM | POA: Diagnosis not present

## 2020-06-14 DIAGNOSIS — R262 Difficulty in walking, not elsewhere classified: Secondary | ICD-10-CM | POA: Diagnosis not present

## 2020-06-20 DIAGNOSIS — M25661 Stiffness of right knee, not elsewhere classified: Secondary | ICD-10-CM | POA: Diagnosis not present

## 2020-06-20 DIAGNOSIS — R531 Weakness: Secondary | ICD-10-CM | POA: Diagnosis not present

## 2020-06-20 DIAGNOSIS — R262 Difficulty in walking, not elsewhere classified: Secondary | ICD-10-CM | POA: Diagnosis not present

## 2020-06-20 DIAGNOSIS — Z96651 Presence of right artificial knee joint: Secondary | ICD-10-CM | POA: Diagnosis not present

## 2020-06-22 DIAGNOSIS — Z96651 Presence of right artificial knee joint: Secondary | ICD-10-CM | POA: Diagnosis not present

## 2020-06-22 DIAGNOSIS — M25661 Stiffness of right knee, not elsewhere classified: Secondary | ICD-10-CM | POA: Diagnosis not present

## 2020-06-22 DIAGNOSIS — R262 Difficulty in walking, not elsewhere classified: Secondary | ICD-10-CM | POA: Diagnosis not present

## 2020-06-22 DIAGNOSIS — R531 Weakness: Secondary | ICD-10-CM | POA: Diagnosis not present

## 2020-07-03 DIAGNOSIS — M25661 Stiffness of right knee, not elsewhere classified: Secondary | ICD-10-CM | POA: Diagnosis not present

## 2020-07-03 DIAGNOSIS — R262 Difficulty in walking, not elsewhere classified: Secondary | ICD-10-CM | POA: Diagnosis not present

## 2020-07-03 DIAGNOSIS — R531 Weakness: Secondary | ICD-10-CM | POA: Diagnosis not present

## 2020-07-03 DIAGNOSIS — Z96651 Presence of right artificial knee joint: Secondary | ICD-10-CM | POA: Diagnosis not present

## 2020-07-05 DIAGNOSIS — M25661 Stiffness of right knee, not elsewhere classified: Secondary | ICD-10-CM | POA: Diagnosis not present

## 2020-07-05 DIAGNOSIS — R262 Difficulty in walking, not elsewhere classified: Secondary | ICD-10-CM | POA: Diagnosis not present

## 2020-07-05 DIAGNOSIS — R531 Weakness: Secondary | ICD-10-CM | POA: Diagnosis not present

## 2020-07-05 DIAGNOSIS — Z96651 Presence of right artificial knee joint: Secondary | ICD-10-CM | POA: Diagnosis not present

## 2020-07-12 ENCOUNTER — Other Ambulatory Visit: Payer: Self-pay | Admitting: Family Medicine

## 2020-07-12 ENCOUNTER — Telehealth: Payer: Self-pay | Admitting: *Deleted

## 2020-07-12 DIAGNOSIS — I10 Essential (primary) hypertension: Secondary | ICD-10-CM

## 2020-07-12 NOTE — Telephone Encounter (Signed)
Ok to refill 

## 2020-07-12 NOTE — Telephone Encounter (Signed)
Received call from patient.   Reports that he is having flare of diverticulitis. States that he is having frequent loose stools. Reports slight lower abdominal pain. Requested prescription for something to ease frequency of stools.   Reports that he does not recall eating anything that would cause flare, such as tomatoes, poppy seeds, sesame seeds, etc.   Advised to schedule OV for evaluation. Reports that he does not think he requires OV at this time. States that he will use OTC Pepto Bismol to ease Sx.   Requested PCP to be made aware.

## 2020-07-13 MED ORDER — METRONIDAZOLE 500 MG PO TABS: 500.0000 mg | ORAL_TABLET | Freq: Two times a day (BID) | ORAL | 0 refills | Status: AC

## 2020-07-13 MED ORDER — RISAQUAD PO CAPS: 1.0000 | ORAL_CAPSULE | Freq: Every day | ORAL | 0 refills | Status: DC

## 2020-07-13 MED ORDER — CIPROFLOXACIN HCL 500 MG PO TABS: 500.0000 mg | ORAL_TABLET | Freq: Two times a day (BID) | ORAL | 0 refills | Status: AC

## 2020-07-13 NOTE — Telephone Encounter (Signed)
Call placed to patient and patient made aware.   States that slight pain has worsened. Prescription sent to pharmacy.   Also advised to use probiotic after ABTx use.

## 2020-07-18 DIAGNOSIS — Z96651 Presence of right artificial knee joint: Secondary | ICD-10-CM | POA: Diagnosis not present

## 2020-07-18 DIAGNOSIS — Z471 Aftercare following joint replacement surgery: Secondary | ICD-10-CM | POA: Diagnosis not present

## 2020-07-18 DIAGNOSIS — Z9889 Other specified postprocedural states: Secondary | ICD-10-CM | POA: Diagnosis not present

## 2020-07-31 ENCOUNTER — Telehealth: Payer: Self-pay | Admitting: Pharmacist

## 2020-07-31 NOTE — Progress Notes (Addendum)
Chronic Care Management Pharmacy Assistant   Name: [name]  MRN: [id number] DOB: Jan 31, 1950  Reason for Encounter: Disease State For HTN.    Conditions to be addressed/monitored: Hypertension and Hyperlipidemia  Recent office visits:  None since 06/07/20.  Recent consult visits:  None since 06/07/20  Hospital visits:  None since 06/07/20  Medications: Outpatient Encounter Medications as of 07/31/2020  Medication Sig   acidophilus (RISAQUAD) CAPS capsule Take 1 capsule by mouth daily.   CELEBREX 100 MG capsule Take 100 mg by mouth 2 (two) times daily.   cetirizine (ZYRTEC) 10 MG tablet TAKE 1 TABLET BY MOUTH EVERY DAY   ezetimibe (ZETIA) 10 MG tablet Take 1 tablet (10 mg total) by mouth daily.   losartan (COZAAR) 50 MG tablet Take 1 tablet by mouth daily.   Misc Natural Products (TART CHERRY ADVANCED PO) Take 1,000 mg by mouth daily.   neomycin-polymyxin b-dexamethasone (MAXITROL) 3.5-10000-0.1 SUSP Place 1 drop into the right eye 3 (three) times daily.   omeprazole (PRILOSEC) 20 MG capsule Take 1 capsule by mouth every morning.   oxyCODONE (OXY IR/ROXICODONE) 5 MG immediate release tablet Take 5-10 mg by mouth every 4 (four) hours as needed.   tadalafil (CIALIS) 5 MG tablet TAKE 1 TABLET BY MOUTH ONCE DAILY AS NEEDED FOR ERECTILE DYSFUNCTION   tiZANidine (ZANAFLEX) 4 MG tablet Take 1 tablet (4 mg total) by mouth every 6 (six) hours as needed for muscle spasms.   Vitamin D, Ergocalciferol, (DRISDOL) 1.25 MG (50000 UNIT) CAPS capsule TAKE 1 CAPSULE (50,000 UNITS TOTAL) BY MOUTH EVERY 7 (SEVEN) DAYS.   No facility-administered encounter medications on file as of 07/31/2020.    Reviewed chart prior to disease state call. Spoke with patient regarding BP  Recent Office Vitals: BP Readings from Last 3 Encounters:  03/27/20 122/76  02/08/20 140/82  12/28/19 130/70   Pulse Readings from Last 3 Encounters:  03/27/20 74  02/08/20 75  12/28/19 72    Wt Readings from Last 3  Encounters:  03/27/20 225 lb (102.1 kg)  02/08/20 222 lb (100.7 kg)  12/28/19 222 lb (100.7 kg)     Kidney Function Lab Results  Component Value Date/Time   CREATININE 1.33 (H) 04/03/2020 04:32 PM   CREATININE 1.49 (H) 03/27/2020 03:48 PM   GFRNONAA 53 (L) 04/03/2020 04:32 PM   GFRAA 62 04/03/2020 04:32 PM    BMP Latest Ref Rng & Units 04/03/2020 03/27/2020 11/16/2019  Glucose 65 - 99 mg/dL 87 104(H) 88  BUN 7 - 25 mg/dL 20 29(H) 24  Creatinine 0.70 - 1.18 mg/dL 1.33(H) 1.49(H) 1.38(H)  BUN/Creat Ratio 6 - 22 (calc) 15 19 17   Sodium 135 - 146 mmol/L 138 139 137  Potassium 3.5 - 5.3 mmol/L 4.6 4.5 4.5  Chloride 98 - 110 mmol/L 103 107 104  CO2 20 - 32 mmol/L 25 24 25   Calcium 8.6 - 10.3 mg/dL 9.6 9.5 9.5    Current antihypertensive regimen:  Losartan 50 mg daily  How often are you checking your Blood Pressure? Patient stated daily.  Current home BP readings: Patient stated his most recent blood pressure readings were 140/80, 116/74, 120/82  What recent interventions/DTPs have been made by any provider to improve Blood Pressure control since last CPP Visit: None.  Any recent hospitalizations or ED visits since last visit with CPP? Patient stated no.  What diet changes have been made to improve Blood Pressure Control?  Patient stated he usually eats at home with his wife.  He stated he drinks a lot of water daily. He stated he does not eat out very often.  What exercise is being done to improve your Blood Pressure Control?  Patient stated he works daily at work but does not do any regular exercising.   Adherence Review: Is the patient currently on ACE/ARB medication? Losartan 50 mg   Does the patient have >5 day gap between last estimated fill dates? Per misc rpts, yes.  Star Rating Drugs: Losartan 50 mg 90 DS 05/07/20  Patient stated he sometimes take a losartan 100 mg when his blood pressure is reading high levels. He stated he recently hurt his back and is waiting to see  the doctor next week.   Follow-Up:Pharmacist Review  Charlann Lange, RMA Clinical Pharmacist Assistant [phone]  10 minutes spent in review, coordination, and documentation.  Reviewed by: Beverly Milch, PharmD Clinical Pharmacist [facility] [phone]

## 2020-08-07 ENCOUNTER — Encounter: Payer: Self-pay | Admitting: Family Medicine

## 2020-08-07 ENCOUNTER — Ambulatory Visit (INDEPENDENT_AMBULATORY_CARE_PROVIDER_SITE_OTHER): Payer: PPO | Admitting: Family Medicine

## 2020-08-07 ENCOUNTER — Other Ambulatory Visit: Payer: Self-pay

## 2020-08-07 VITALS — BP 138/78 | HR 66 | Temp 98.7°F | Resp 14 | Ht 73.0 in | Wt 214.0 lb

## 2020-08-07 DIAGNOSIS — I10 Essential (primary) hypertension: Secondary | ICD-10-CM

## 2020-08-07 DIAGNOSIS — S39012A Strain of muscle, fascia and tendon of lower back, initial encounter: Secondary | ICD-10-CM | POA: Diagnosis not present

## 2020-08-07 MED ORDER — TIZANIDINE HCL 4 MG PO TABS: 4.0000 mg | ORAL_TABLET | Freq: Four times a day (QID) | ORAL | 0 refills | Status: DC | PRN

## 2020-08-07 MED ORDER — TADALAFIL 5 MG PO TABS: 5.0000 mg | ORAL_TABLET | Freq: Every day | ORAL | 3 refills | Status: DC | PRN

## 2020-08-07 NOTE — Progress Notes (Signed)
Subjective:    Patient ID: [name], male    DOB: 09/23/1950, 70 y.o.   MRN: [id number]  HPI Patient reports gradual onset of low back pain.  Is located to the right of the lumbar spine roughly around the level of L4-L5.  He reports pain when bending over.  He reports stiffness.  He denies any numbness or tingling in his legs.  He denies any weakness in his legs.  He denies any sciatica.  Pain has worsened since he started doing physical therapy for his knee replacement.  He attributes this to a slight leg length discrepancy.  He describes the pain as muscle stiffness and tightness.  He had an MRI performed of the lumbar spine which showed 7 mm of anterior listhesis of L4 on L5.  He also has some bulging disc and degenerative disc disease but no significant nerve impingement.  Past Medical History:  Diagnosis Date   Adenomatous colon polyp 2004   Allergy    Diverticulosis 2011   Former smoker    HLD (hyperlipidemia)    Hyperplastic colon polyp 2004   Hypertension    Melanoma in situ [facility])    Past Surgical History:  Procedure Laterality Date   COLONOSCOPY  2011   SEVERAL    Current Outpatient Medications on File Prior to Visit  Medication Sig Dispense Refill   acidophilus (RISAQUAD) CAPS capsule Take 1 capsule by mouth daily. 30 capsule 0   CELEBREX 100 MG capsule Take 100 mg by mouth 2 (two) times daily.     cetirizine (ZYRTEC) 10 MG tablet TAKE 1 TABLET BY MOUTH EVERY DAY 90 tablet 3   ezetimibe (ZETIA) 10 MG tablet Take 1 tablet (10 mg total) by mouth daily. 90 tablet 3   losartan (COZAAR) 50 MG tablet Take 1 tablet by mouth daily.     Misc Natural Products (TART CHERRY ADVANCED PO) Take 1,000 mg by mouth daily.     neomycin-polymyxin b-dexamethasone (MAXITROL) 3.5-10000-0.1 SUSP Place 1 drop into the right eye 3 (three) times daily.     omeprazole (PRILOSEC) 20 MG capsule Take 1 capsule by mouth every morning.     oxyCODONE (OXY IR/ROXICODONE) 5 MG immediate release tablet  Take 5-10 mg by mouth every 4 (four) hours as needed.     tadalafil (CIALIS) 5 MG tablet TAKE 1 TABLET BY MOUTH ONCE DAILY AS NEEDED FOR ERECTILE DYSFUNCTION 30 tablet 0   tiZANidine (ZANAFLEX) 4 MG tablet Take 1 tablet (4 mg total) by mouth every 6 (six) hours as needed for muscle spasms. 30 tablet 1   Vitamin D, Ergocalciferol, (DRISDOL) 1.25 MG (50000 UNIT) CAPS capsule TAKE 1 CAPSULE (50,000 UNITS TOTAL) BY MOUTH EVERY 7 (SEVEN) DAYS. 12 capsule 0   No current facility-administered medications on file prior to visit.   Allergies  Allergen Reactions   Levaquin [Levofloxacin In D5w] Shortness Of Breath   Crestor [Rosuvastatin Calcium] Diarrhea   Social History   Socioeconomic History   Marital status: Married    Spouse name: Not on file   Number of children: 2   Years of education: Not on file   Highest education level: Not on file  Occupational History   Occupation: part Scientist, clinical (histocompatibility and immunogenetics): [employer]  Tobacco Use   Smoking status: Former    Packs/day: 1.00    Years: 30.00    Pack years: 30.00    Types: Cigarettes, Cigars    Quit date: 05/13/1998    Years since  quitting: 22.2   Smokeless tobacco: Current    Types: Chew  Substance and Sexual Activity   Alcohol use: Yes    Comment: freq   Drug use: No   Sexual activity: Not on file  Other Topics Concern   Not on file  Social History Narrative   Not on file   Social Determinants of Health   Financial Resource Strain: Low Risk    Difficulty of Paying Living Expenses: Not very hard  Food Insecurity: Not on file  Transportation Needs: Not on file  Physical Activity: Not on file  Stress: Not on file  Social Connections: Not on file  Intimate Partner Violence: Not on file   Family History  Problem Relation Age of Onset   Lung cancer Father    Heart disease Mother        died in her 30's   Diabetes Daughter      Review of Systems     Objective:   Physical Exam Vitals reviewed.  Constitutional:      General:  He is not in acute distress.    Appearance: He is not diaphoretic.  HENT:     Head: Normocephalic and atraumatic.     Right Ear: External ear normal.     Left Ear: External ear normal.     Nose: Nose normal.     Mouth/Throat:     Pharynx: No oropharyngeal exudate.  Eyes:     General: No scleral icterus.       Right eye: No discharge.        Left eye: No discharge.     Conjunctiva/sclera: Conjunctivae normal.     Pupils: Pupils are equal, round, and reactive to light.  Cardiovascular:     Rate and Rhythm: Normal rate and regular rhythm.     Heart sounds: Normal heart sounds. No murmur heard.   No friction rub. No gallop.  Pulmonary:     Effort: Pulmonary effort is normal. No respiratory distress.     Breath sounds: Normal breath sounds. No stridor. No wheezing or rales.  Chest:     Chest wall: No tenderness.  Abdominal:     General: Bowel sounds are normal. There is no distension.     Palpations: Abdomen is soft. There is no mass.     Tenderness: There is no abdominal tenderness. There is no guarding or rebound.  Musculoskeletal:        General: No deformity. Normal range of motion.     Cervical back: Normal range of motion and neck supple.     Lumbar back: Spasms and tenderness present. No swelling, deformity or bony tenderness. Normal range of motion.       Back:  Lymphadenopathy:     Cervical: No cervical adenopathy.  Skin:    General: Skin is warm.     Coloration: Skin is not pale.     Findings: No erythema or rash.  Neurological:     Mental Status: He is alert and oriented to person, place, and time.     Cranial Nerves: No cranial nerve deficit.     Coordination: Coordination normal.  Psychiatric:        Behavior: Behavior normal.        Thought Content: Thought content normal.        Judgment: Judgment normal.          Assessment & Plan:  Strain of lumbar region, initial encounter  Essential hypertension - Plan: tadalafil (CIALIS) 5 MG tablet  Recommended  a trial of Zanaflex 4 mg p.o. every 8 hours as needed muscle stiffness.  Also gave the patient a series of stretches that he can perform for low back pain as I suspect the majority of this is muscular and due to muscle stiffness and muscle strain.

## 2020-08-21 ENCOUNTER — Encounter: Payer: Self-pay | Admitting: Family Medicine

## 2020-08-29 DIAGNOSIS — Z96651 Presence of right artificial knee joint: Secondary | ICD-10-CM | POA: Diagnosis not present

## 2020-08-29 DIAGNOSIS — Z09 Encounter for follow-up examination after completed treatment for conditions other than malignant neoplasm: Secondary | ICD-10-CM | POA: Diagnosis not present

## 2020-09-12 ENCOUNTER — Other Ambulatory Visit: Payer: Self-pay

## 2020-09-12 ENCOUNTER — Encounter: Payer: Self-pay | Admitting: Family Medicine

## 2020-09-12 ENCOUNTER — Ambulatory Visit (INDEPENDENT_AMBULATORY_CARE_PROVIDER_SITE_OTHER): Payer: PPO | Admitting: Family Medicine

## 2020-09-12 VITALS — BP 140/90 | HR 83 | Temp 97.7°F | Ht 72.0 in | Wt 208.0 lb

## 2020-09-12 DIAGNOSIS — I209 Angina pectoris, unspecified: Secondary | ICD-10-CM

## 2020-09-12 DIAGNOSIS — R079 Chest pain, unspecified: Secondary | ICD-10-CM | POA: Diagnosis not present

## 2020-09-12 LAB — COMPLETE METABOLIC PANEL WITH GFR
AG Ratio: 1.9 (calc) (ref 1.0–2.5)
ALT: 11 U/L (ref 9–46)
AST: 12 U/L (ref 10–35)
Albumin: 4.2 g/dL (ref 3.6–5.1)
Alkaline phosphatase (APISO): 79 U/L (ref 35–144)
BUN: 18 mg/dL (ref 7–25)
CO2: 30 mmol/L (ref 20–32)
Calcium: 9.6 mg/dL (ref 8.6–10.3)
Chloride: 102 mmol/L (ref 98–110)
Creat: 1.28 mg/dL (ref 0.70–1.28)
Globulin: 2.2 g/dL (calc) (ref 1.9–3.7)
Glucose, Bld: 96 mg/dL (ref 65–99)
Potassium: 5 mmol/L (ref 3.5–5.3)
Sodium: 137 mmol/L (ref 135–146)
Total Bilirubin: 0.7 mg/dL (ref 0.2–1.2)
Total Protein: 6.4 g/dL (ref 6.1–8.1)
eGFR: 60 mL/min/{1.73_m2} (ref >=60)

## 2020-09-12 LAB — CBC WITH DIFFERENTIAL/PLATELET
Absolute Monocytes: 1300 cells/uL — ABNORMAL HIGH (ref 200–950)
Basophils Absolute: 81 cells/uL (ref 0–200)
Basophils Relative: 0.7 %
Eosinophils Absolute: 368 cells/uL (ref 15–500)
Eosinophils Relative: 3.2 %
HCT: 44.8 % (ref 38.5–50.0)
Hemoglobin: 14.6 g/dL (ref 13.2–17.1)
Lymphs Abs: 4083 cells/uL — ABNORMAL HIGH (ref 850–3900)
MCH: 27.8 pg (ref 27.0–33.0)
MCHC: 32.6 g/dL (ref 32.0–36.0)
MCV: 85.3 fL (ref 80.0–100.0)
MPV: 10.7 fL (ref 7.5–12.5)
Monocytes Relative: 11.3 %
Neutro Abs: 5670 cells/uL (ref 1500–7800)
Neutrophils Relative %: 49.3 %
Platelets: 324 10*3/uL (ref 140–400)
RBC: 5.25 10*6/uL (ref 4.20–5.80)
RDW: 13 % (ref 11.0–15.0)
Total Lymphocyte: 35.5 %
WBC: 11.5 10*3/uL — ABNORMAL HIGH (ref 3.8–10.8)

## 2020-09-12 LAB — LIPID PANEL
Cholesterol: 151 mg/dL (ref <200)
HDL: 35 mg/dL — ABNORMAL LOW (ref >=40)
LDL Cholesterol (Calc): 86 mg/dL (calc)
Non-HDL Cholesterol (Calc): 116 mg/dL (calc) (ref <130)
Total CHOL/HDL Ratio: 4.3 (calc) (ref <5.0)
Triglycerides: 208 mg/dL — ABNORMAL HIGH (ref <150)

## 2020-09-12 MED ORDER — NITROGLYCERIN 0.4 MG SL SUBL: 0.4000 mg | SUBLINGUAL_TABLET | SUBLINGUAL | 3 refills | Status: DC | PRN

## 2020-09-12 MED ORDER — METOPROLOL SUCCINATE ER 25 MG PO TB24: 25.0000 mg | ORAL_TABLET | Freq: Every day | ORAL | 3 refills | Status: DC

## 2020-09-12 MED ORDER — ROSUVASTATIN CALCIUM 20 MG PO TABS: 20.0000 mg | ORAL_TABLET | Freq: Every day | ORAL | 3 refills | Status: DC

## 2020-09-12 NOTE — Progress Notes (Signed)
Subjective:    Patient ID: [name], male    DOB: 01-27-1950, 70 y.o.   MRN: [id number]  HPI Patient is a very pleasant 70 year old Caucasian male who presents today reporting "indigestion".  He states for the last few weeks, he has been having heartburn almost on a daily basis.  He states that he reports a sensation in the center of his chest that is a burning sensation that radiates up into his neck.  However he became extremely concerned on Saturday.  He went to the junkyard.  He was removing parts from a truck.  This simply involve him using a socket wrench.  There was no strenuous activity.  However he states that he became extremely winded and was unable to catch his breath.  He was struggling to breathe for several minutes.  He also felt some tightness and pain in his chest that he describes as indigestion.  After resting for several minutes, he was able to finally catch his breath and the pain subsided.  This prompted his visit today.  Blood pressure is borderline at 140/90.  He does have a remote history of smoking.  LDL cholesterol was elevated in March. Past Medical History:  Diagnosis Date   Adenomatous colon polyp 2004   Allergy    Diverticulosis 2011   Former smoker    HLD (hyperlipidemia)    Hyperplastic colon polyp 2004   Hypertension    Melanoma in situ [facility])    Past Surgical History:  Procedure Laterality Date   COLONOSCOPY  2011   SEVERAL    Current Outpatient Medications on File Prior to Visit  Medication Sig Dispense Refill   atorvastatin (LIPITOR) 40 MG tablet Take 40 mg by mouth daily.     cetirizine (ZYRTEC) 10 MG tablet TAKE 1 TABLET BY MOUTH EVERY DAY 90 tablet 3   ezetimibe (ZETIA) 10 MG tablet Take 1 tablet (10 mg total) by mouth daily. 90 tablet 3   losartan (COZAAR) 50 MG tablet Take 1 tablet by mouth daily.     omeprazole (PRILOSEC) 20 MG capsule Take 1 capsule by mouth every morning.     tadalafil (CIALIS) 5 MG tablet Take 1 tablet (5 mg total) by  mouth daily as needed for erectile dysfunction. 30 tablet 3   tiZANidine (ZANAFLEX) 4 MG tablet Take 1 tablet (4 mg total) by mouth every 6 (six) hours as needed for muscle spasms. 30 tablet 0   No current facility-administered medications on file prior to visit.     Allergies  Allergen Reactions   Levaquin [Levofloxacin In D5w] Shortness Of Breath   Crestor [Rosuvastatin Calcium] Diarrhea   Social History   Socioeconomic History   Marital status: Married    Spouse name: Not on file   Number of children: 2   Years of education: Not on file   Highest education level: Not on file  Occupational History   Occupation: part Scientist, clinical (histocompatibility and immunogenetics): [employer]  Tobacco Use   Smoking status: Former    Packs/day: 1.00    Years: 30.00    Pack years: 30.00    Types: Cigarettes, Cigars    Quit date: 05/13/1998    Years since quitting: 22.3   Smokeless tobacco: Current    Types: Chew  Substance and Sexual Activity   Alcohol use: Yes    Comment: freq   Drug use: No   Sexual activity: Not on file  Other Topics Concern   Not on file  Social History Narrative   Not on file   Social Determinants of Health   Financial Resource Strain: Low Risk    Difficulty of Paying Living Expenses: Not very hard  Food Insecurity: Not on file  Transportation Needs: Not on file  Physical Activity: Not on file  Stress: Not on file  Social Connections: Not on file  Intimate Partner Violence: Not on file   Family History  Problem Relation Age of Onset   Lung cancer Father    Heart disease Mother        died in her 47's   Diabetes Daughter      Review of Systems  All other systems reviewed and are negative.     Objective:   Physical Exam Vitals reviewed.  Constitutional:      General: He is not in acute distress.    Appearance: He is well-developed. He is not diaphoretic.  HENT:     Head: Normocephalic and atraumatic.     Right Ear: External ear normal.     Left Ear: External ear normal.      Nose: Nose normal.     Mouth/Throat:     Pharynx: No oropharyngeal exudate.  Eyes:     General: No scleral icterus.       Right eye: No discharge.        Left eye: No discharge.     Conjunctiva/sclera: Conjunctivae normal.     Pupils: Pupils are equal, round, and reactive to light.  Neck:     Thyroid: No thyromegaly.     Vascular: No JVD.     Trachea: No tracheal deviation.  Cardiovascular:     Rate and Rhythm: Normal rate and regular rhythm.     Heart sounds: Normal heart sounds. No murmur heard.   No friction rub. No gallop.  Pulmonary:     Effort: Pulmonary effort is normal. No respiratory distress.     Breath sounds: Normal breath sounds. No stridor. No wheezing or rales.  Chest:     Chest wall: No tenderness.  Abdominal:     General: Bowel sounds are normal. There is no distension.     Palpations: Abdomen is soft. There is no mass.     Tenderness: There is no abdominal tenderness. There is no guarding or rebound.  Musculoskeletal:        General: No tenderness or deformity. Normal range of motion.     Cervical back: Normal range of motion and neck supple.  Lymphadenopathy:     Cervical: No cervical adenopathy.  Skin:    General: Skin is warm.     Coloration: Skin is not pale.     Findings: No erythema or rash.  Neurological:     Mental Status: He is alert and oriented to person, place, and time.     Cranial Nerves: No cranial nerve deficit.     Motor: No abnormal muscle tone.     Coordination: Coordination normal.     Deep Tendon Reflexes: Reflexes are normal and symmetric.  Psychiatric:        Behavior: Behavior normal.        Thought Content: Thought content normal.        Judgment: Judgment normal.          Assessment & Plan:  Chest pain, unspecified type - Plan: EKG 12-Lead I am concerned that the patient experienced angina Saturday.  He was having chest pain and shortness of breath with activity.  It resolved with rest.  Therefore I feel that he  needs a stress test as soon as possible with cardiology.  EKG today shows normal sinus rhythm with normal intervals and a normal axis.  The patient does have frequent PVCs but no evidence of ischemia or infarction.  That being said, I feel that the patient needs a stress test as soon as possible.  I want the patient to start taking aspirin 81 mg daily.  I want him to start Toprol-XL 25 mg daily in addition to his losartan.  He has not been taking atorvastatin but has been taking Zetia.  He stopped atorvastatin due to myalgias.  I asked him to start Crestor 20 mg a day in place of both of these until we get him seen by cardiology.  Also gave the patient a prescription for nitroglycerin to be used if he develops chest pain with activity.  If the pain happens again he is to go directly to the emergency room.  However I will try to expedite outpatient cardiology consultation

## 2020-09-15 ENCOUNTER — Ambulatory Visit: Payer: PPO | Admitting: Cardiology

## 2020-09-15 ENCOUNTER — Encounter: Payer: Self-pay | Admitting: Cardiology

## 2020-09-15 ENCOUNTER — Other Ambulatory Visit: Payer: Self-pay

## 2020-09-15 VITALS — BP 126/72 | HR 68 | Ht 72.0 in | Wt 207.0 lb

## 2020-09-15 DIAGNOSIS — E7800 Pure hypercholesterolemia, unspecified: Secondary | ICD-10-CM | POA: Diagnosis not present

## 2020-09-15 DIAGNOSIS — I10 Essential (primary) hypertension: Secondary | ICD-10-CM

## 2020-09-15 DIAGNOSIS — R072 Precordial pain: Secondary | ICD-10-CM

## 2020-09-15 MED ORDER — METOPROLOL TARTRATE 100 MG PO TABS: 100.0000 mg | ORAL_TABLET | Freq: Once | ORAL | 0 refills | Status: DC

## 2020-09-15 NOTE — Patient Instructions (Signed)
Medication Instructions:   Your physician recommends that you continue on your current medications as directed. Please refer to the Current Medication list given to you today.  *If you need a refill on your cardiac medications before your next appointment, please call your pharmacy*   Lab Work:  BMP drawn in office today.   If you have labs (blood work) drawn today and your tests are completely normal, you will receive your results only by: [place] (if you have MyChart) OR A paper copy in the mail If you have any lab test that is abnormal or we need to change your treatment, we will call you to review the results.   Testing/Procedures:  Your physician has requested that you have an echocardiogram. Echocardiography is a painless test that uses sound waves to create images of your heart. It provides your doctor with information about the size and shape of your heart and how well your heart's chambers and valves are working. This procedure takes approximately one hour. There are no restrictions for this procedure.  Your physician has requested that you have cardiac CT. Cardiac computed tomography (CT) is a painless test that uses an x-ray machine to take clear, detailed pictures of your heart.    Your cardiac CT will be scheduled at:   Thursday  09/21/20  at [address] [place], [place] [address] [phone]  Please arrive 15 mins early for check-in and test prep.   Please follow these instructions carefully (unless otherwise directed):   Hold all erectile dysfunction medications at least 3 days (72 hrs) prior to test.   On the Night Before the Test: Be sure to Drink plenty of water. Do not consume any caffeinated/decaffeinated beverages or chocolate 12 hours prior to your test.   On the Day of the Test: Drink plenty of water until 1 hour prior to the test. Do not eat any food 4 hours prior to the  test. You may take your regular medications prior to the test.  Take metoprolol (Lopressor) two hours prior to test.   After the Test: Drink plenty of water. After receiving IV contrast, you may experience a mild flushed feeling. This is normal. On occasion, you may experience a mild rash up to 24 hours after the test. This is not dangerous. If this occurs, you can take Benadryl 25 mg and increase your fluid intake. If you experience trouble breathing, this can be serious. If it is severe call 911 IMMEDIATELY. If it is mild, please call our office. If you take any of these medications: Glipizide/Metformin, Avandament, Glucavance, please do not take 48 hours after completing test unless otherwise instructed.  Please allow 2-4 weeks for scheduling of routine cardiac CTs. Some insurance companies require a pre-authorization which may delay scheduling of this test.   For non-scheduling related questions, please contact the cardiac imaging nurse navigator should you have any questions/concerns: Marchia Bond, Cardiac Imaging Nurse Navigator Gordy Clement, Cardiac Imaging Nurse Navigator [facility] Direct Office Dial: [phone]   For scheduling needs, including cancellations and rescheduling, please call [place], [phone].    Follow-Up: At [facility], you and your health needs are our priority.  As part of our continuing mission to provide you with exceptional heart care, we have created designated Provider Care Teams.  These Care Teams include your primary Cardiologist (physician) and Advanced Practice Providers (APPs -  Physician Assistants and Nurse Practitioners) who all  work together to provide you with the care you need, when you need it.  We recommend signing up for the patient portal called "MyChart".  Sign up information is provided on this After Visit Summary.  MyChart is used to connect with patients for Virtual Visits (Telemedicine).  Patients  are able to view lab/test results, encounter notes, upcoming appointments, etc.  Non-urgent messages can be sent to your provider as well.   To learn more about what you can do with MyChart, go to [email or web address].    Your next appointment:    Follow up after testing  (CCTA  09/21/20)  The format for your next appointment:   In Person  Provider:   You may see Dr. Garen Lah or one of the following Advanced Practice Providers on your designated Care Team:   Murray Hodgkins, NP Christell Faith, PA-C Marrianne Mood, PA-C Cadence Kathlen Mody, [place]   Other Instructions

## 2020-09-15 NOTE — Progress Notes (Signed)
Cardiology Office Note:    Date:  09/15/2020   ID:  [name], DOB [birth year], MRN [id number]  PCP:  Susy Frizzle, MD   [facility] Providers Cardiologist:  None     Referring MD: Susy Frizzle, MD   Chief Complaint  Patient presents with   New Patient (Initial Visit)    Referred by PCP for Chest pain and SOB. Meds reviewed verbally with patient.    [name] is a 70 y.o. male who is being seen today for the evaluation of chest pain at the request of Susy Frizzle, MD.   History of Present Illness:    [name] is a 70 y.o. male with a hx of hypertension, hyperlipidemia, former smoker x30 years who presents due to chest pain.  Patient states hormonal chest pain over the past 6 weeks.  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  He has had chest pain before but not this bad.  Also endorsed having shortness of breath with exertion over the past 6 weeks.  Followed up with primary care physician who recommended he sees a cardiologist.  Was started on nitroglycerin sublingual as needed, Toprol-XL was added to his BP regimen.  He denies any personal history of heart disease.  Takes Cialis for enlarged prostate.  Quit smoking years ago.   Past Medical History:  Diagnosis Date   Adenomatous colon polyp 2004   Allergy    Diverticulosis 2011   Former smoker    HLD (hyperlipidemia)    Hyperplastic colon polyp 2004   Hypertension    Melanoma in situ [place])     Past Surgical History:  Procedure Laterality Date   COLONOSCOPY  01/21/2009   SEVERAL    TOTAL KNEE ARTHROPLASTY Right 04/26/2020    Current Medications: Current Meds  Medication Sig   cetirizine (ZYRTEC) 10 MG tablet TAKE 1 TABLET BY MOUTH EVERY DAY   losartan (COZAAR) 50 MG tablet Take 1 tablet by mouth daily.   metoprolol succinate (TOPROL-XL) 25 MG 24 hr tablet Take 1 tablet (25 mg total) by mouth daily.    metoprolol tartrate (LOPRESSOR) 100 MG tablet Take 1 tablet (100 mg total) by mouth once for 1 dose. Take 2 hours prior to your CT scan.   nitroGLYCERIN (NITROSTAT) 0.4 MG SL tablet Place 1 tablet (0.4 mg total) under the tongue every 5 (five) minutes as needed for chest pain.   omeprazole (PRILOSEC) 20 MG capsule Take 1 capsule by mouth every morning.   rosuvastatin (CRESTOR) 20 MG tablet Take 1 tablet (20 mg total) by mouth daily.   tadalafil (CIALIS) 5 MG tablet Take 1 tablet (5 mg total) by mouth daily as needed for erectile dysfunction.   tiZANidine (ZANAFLEX) 4 MG tablet Take 1 tablet (4 mg total) by mouth every 6 (six) hours as needed for muscle spasms.     Allergies:   Levaquin [levofloxacin in d5w] and Crestor [rosuvastatin calcium]   Social History   Socioeconomic History   Marital status: Married    Spouse name: Not on file   Number of children: 2   Years of education: Not on file   Highest education level: Not on file  Occupational History   Occupation: part Scientist, clinical (histocompatibility and immunogenetics): [employer]  Tobacco Use   Smoking status: Former    Packs/day: 1.00    Years: 30.00    Pack years:  30.00    Types: Cigarettes, Cigars    Quit date: 05/13/1998    Years since quitting: 22.3   Smokeless tobacco: Current    Types: Chew  Substance and Sexual Activity   Alcohol use: Yes    Comment: freq   Drug use: No   Sexual activity: Not on file  Other Topics Concern   Not on file  Social History Narrative   Not on file   Social Determinants of Health   Financial Resource Strain: Low Risk    Difficulty of Paying Living Expenses: Not very hard  Food Insecurity: Not on file  Transportation Needs: Not on file  Physical Activity: Not on file  Stress: Not on file  Social Connections: Not on file     Family History: The patient's family history includes Diabetes in his daughter; Heart disease in his mother; Lung cancer in his father.  ROS:   Please see the history of present illness.      All other systems reviewed and are negative.  EKGs/Labs/Other Studies Reviewed:    The following studies were reviewed today:   EKG:  EKG not ordered today.    EKG from PCPs office dated 09/12/2020 shows sinus rhythm, frequent PVCs  Recent Labs: 09/12/2020: ALT 11; BUN 18; Creat 1.28; Hemoglobin 14.6; Platelets 324; Potassium 5.0; Sodium 137  Recent Lipid Panel    Component Value Date/Time   CHOL 151 09/12/2020 1611   TRIG 208 (H) 09/12/2020 1611   HDL 35 (L) 09/12/2020 1611   CHOLHDL 4.3 09/12/2020 1611   VLDL 39 (H) 08/23/2015 0812   LDLCALC 86 09/12/2020 1611     Risk Assessment/Calculations:          Physical Exam:    VS:  BP 126/72 (BP Location: Left Arm, Patient Position: Sitting, Cuff Size: Normal)   Pulse 68   Ht 6' (1.829 m)   Wt 207 lb (93.9 kg)   SpO2 94%   BMI 28.07 kg/m     Wt Readings from Last 3 Encounters:  09/15/20 207 lb (93.9 kg)  09/12/20 208 lb (94.3 kg)  08/07/20 214 lb (97.1 kg)     GEN:  Well nourished, well developed in no acute distress HEENT: Normal NECK: No JVD; No carotid bruits LYMPHATICS: No lymphadenopathy CARDIAC: RRR, no murmurs, rubs, gallops RESPIRATORY:  Clear to auscultation without rales, wheezing or rhonchi  ABDOMEN: Soft, non-tender, non-distended MUSCULOSKELETAL:  No edema; No deformity  SKIN: Warm and dry NEUROLOGIC:  Alert and oriented x 3 PSYCHIATRIC:  Normal affect   ASSESSMENT:    1. Precordial pain   2. Primary hypertension   3. Pure hypercholesterolemia    PLAN:    In order of problems listed above:  Chest pain, symptoms consistent with angina.  Risk factors hypertension, hyperlipidemia, former smoker.  Get echocardiogram, get coronary CTA to evaluate presence of CAD.  Continue aspirin 81 mg, statin, sublingual nitro as needed. Hypertension, BP controlled.  Continue losartan, Toprol-XL. Hyperlipidemia, low-cholesterol diet.  Continue Crestor at current dose.  Has history of myalgias with  Lipitor.  Follow-up after echo and coronary CTA.     Medication Adjustments/Labs and Tests Ordered: Current medicines are reviewed at length with the patient today.  Concerns regarding medicines are outlined above.  Orders Placed This Encounter  Procedures   CT CORONARY MORPH W/CTA COR W/SCORE W/CA W/CM &/OR WO/CM   Basic metabolic panel   ECHOCARDIOGRAM COMPLETE    Meds ordered this encounter  Medications   metoprolol tartrate (LOPRESSOR)  100 MG tablet    Sig: Take 1 tablet (100 mg total) by mouth once for 1 dose. Take 2 hours prior to your CT scan.    Dispense:  1 tablet    Refill:  0     Patient Instructions  Medication Instructions:   Your physician recommends that you continue on your current medications as directed. Please refer to the Current Medication list given to you today.  *If you need a refill on your cardiac medications before your next appointment, please call your pharmacy*   Lab Work:  BMP drawn in office today.   If you have labs (blood work) drawn today and your tests are completely normal, you will receive your results only by: [place] (if you have MyChart) OR A paper copy in the mail If you have any lab test that is abnormal or we need to change your treatment, we will call you to review the results.   Testing/Procedures:  Your physician has requested that you have an echocardiogram. Echocardiography is a painless test that uses sound waves to create images of your heart. It provides your doctor with information about the size and shape of your heart and how well your heart's chambers and valves are working. This procedure takes approximately one hour. There are no restrictions for this procedure.  Your physician has requested that you have cardiac CT. Cardiac computed tomography (CT) is a painless test that uses an x-ray machine to take clear, detailed pictures of your heart.    Your cardiac CT will be scheduled at:   Thursday  09/21/20   at [address] [place], [place] [address] [phone]  Please arrive 15 mins early for check-in and test prep.   Please follow these instructions carefully (unless otherwise directed):   Hold all erectile dysfunction medications at least 3 days (72 hrs) prior to test.   On the Night Before the Test: Be sure to Drink plenty of water. Do not consume any caffeinated/decaffeinated beverages or chocolate 12 hours prior to your test.   On the Day of the Test: Drink plenty of water until 1 hour prior to the test. Do not eat any food 4 hours prior to the test. You may take your regular medications prior to the test.  Take metoprolol (Lopressor) two hours prior to test.   After the Test: Drink plenty of water. After receiving IV contrast, you may experience a mild flushed feeling. This is normal. On occasion, you may experience a mild rash up to 24 hours after the test. This is not dangerous. If this occurs, you can take Benadryl 25 mg and increase your fluid intake. If you experience trouble breathing, this can be serious. If it is severe call 911 IMMEDIATELY. If it is mild, please call our office. If you take any of these medications: Glipizide/Metformin, Avandament, Glucavance, please do not take 48 hours after completing test unless otherwise instructed.  Please allow 2-4 weeks for scheduling of routine cardiac CTs. Some insurance companies require a pre-authorization which may delay scheduling of this test.   For non-scheduling related questions, please contact the cardiac imaging nurse navigator should you have any questions/concerns: Marchia Bond, Cardiac Imaging Nurse Navigator Gordy Clement, Cardiac Imaging Nurse Navigator [facility] Direct Office Dial: [phone]   For scheduling needs, including cancellations and rescheduling, please call [place],  [phone].    Follow-Up: At [facility], you and your health  needs are our priority.  As part of our continuing mission to provide you with exceptional heart care, we have created designated Provider Care Teams.  These Care Teams include your primary Cardiologist (physician) and Advanced Practice Providers (APPs -  Physician Assistants and Nurse Practitioners) who all work together to provide you with the care you need, when you need it.  We recommend signing up for the patient portal called "MyChart".  Sign up information is provided on this After Visit Summary.  MyChart is used to connect with patients for Virtual Visits (Telemedicine).  Patients are able to view lab/test results, encounter notes, upcoming appointments, etc.  Non-urgent messages can be sent to your provider as well.   To learn more about what you can do with MyChart, go to [email or web address].    Your next appointment:    Follow up after testing  (CCTA  09/21/20)  The format for your next appointment:   In Person  Provider:   You may see Dr. Garen Lah or one of the following Advanced Practice Providers on your designated Care Team:   Murray Hodgkins, NP Christell Faith, PA-C Marrianne Mood, PA-C Cadence [place], [place]   Other Instructions    Signed, Kate Sable, MD  09/15/2020 4:58 PM    [place]

## 2020-09-20 ENCOUNTER — Telehealth (HOSPITAL_COMMUNITY): Payer: Self-pay | Admitting: Emergency Medicine

## 2020-09-20 NOTE — Telephone Encounter (Signed)
Reaching out to patient to offer assistance regarding upcoming cardiac imaging study; pt verbalizes understanding of appt date/time, parking situation and where to check in, pre-test NPO status and medications ordered, and verified current allergies; name and call back number provided for further questions should they arise Marchia Bond RN Navigator Cardiac Imaging [facility] [phone] office [phone] cell  Denies iv issues Denies claustro '100mg'$  metoprolol tart

## 2020-09-21 ENCOUNTER — Encounter: Payer: PPO | Admitting: *Deleted

## 2020-09-21 ENCOUNTER — Other Ambulatory Visit: Payer: Self-pay | Admitting: Cardiology

## 2020-09-21 ENCOUNTER — Other Ambulatory Visit: Payer: Self-pay

## 2020-09-21 ENCOUNTER — Ambulatory Visit
Admission: RE | Admit: 2020-09-21 | Discharge: 2020-09-21 | Disposition: A | Discharge status: Home / Self Care | Payer: PPO | Source: Ambulatory Visit | Attending: Cardiology | Admitting: Cardiology

## 2020-09-21 DIAGNOSIS — R931 Abnormal findings on diagnostic imaging of heart and coronary circulation: Secondary | ICD-10-CM

## 2020-09-21 DIAGNOSIS — R072 Precordial pain: Secondary | ICD-10-CM | POA: Insufficient documentation

## 2020-09-21 DIAGNOSIS — Z006 Encounter for examination for normal comparison and control in clinical research program: Secondary | ICD-10-CM

## 2020-09-21 DIAGNOSIS — I2510 Atherosclerotic heart disease of native coronary artery without angina pectoris: Secondary | ICD-10-CM | POA: Diagnosis not present

## 2020-09-21 MED ORDER — IOHEXOL 350 MG/ML SOLN
75.0000 mL | Freq: Once | INTRAVENOUS | Status: AC | PRN
  Administered 2020-09-21: 75 mL via INTRAVENOUS

## 2020-09-21 MED ORDER — NITROGLYCERIN 0.4 MG SL SUBL
0.8000 mg | SUBLINGUAL_TABLET | Freq: Once | SUBLINGUAL | Status: AC
  Administered 2020-09-21: 0.8 mg via SUBLINGUAL

## 2020-09-21 NOTE — Progress Notes (Signed)
Patient tolerated procedure well. Ambulate w/o difficulty. Denies light headedness or being dizzy. Sitting in chair drinking water provided. Encouraged to drink extra water today and reasoning explained. Verbalized understanding. All questions answered. ABC intact. No further needs. Discharge from procedure area w/o issues.   °

## 2020-09-21 NOTE — Research (Signed)
IDENTIFY G4 Informed Consent   Subject Name: [name]  Subject met inclusion and exclusion criteria.  The informed consent form, study requirements and expectations were reviewed with the subject and questions and concerns were addressed prior to the signing of the consent form.  The subject verbalized understanding of the trial requirements.  The subject agreed to participate in the [place] trial and signed the informed consent at 0759 on 09/21/2020.  The informed consent was obtained prior to performance of any protocol-specific procedures for the subject.  A copy of the signed informed consent was given to the subject and a copy was placed in the subject's medical record.   [name] D

## 2020-09-22 ENCOUNTER — Encounter: Payer: Self-pay | Admitting: Family Medicine

## 2020-09-22 DIAGNOSIS — I2510 Atherosclerotic heart disease of native coronary artery without angina pectoris: Secondary | ICD-10-CM | POA: Diagnosis not present

## 2020-09-26 ENCOUNTER — Telehealth: Payer: Self-pay

## 2020-09-26 NOTE — Telephone Encounter (Signed)
Spoke with patient and informed him of the result note as documented in the previous note.   Patient agreed with the plan, and I scheduled him to come in on 09/28/20 to discuss the cath lab procedure.

## 2020-09-26 NOTE — Telephone Encounter (Signed)
-----   Message from Kate Sable, MD sent at 09/22/2020  4:41 PM EDT ----- Coronary CTA shows significant LAD disease.  Please schedule patient for follow-up appointment, he will likely need a left heart cath.

## 2020-09-28 ENCOUNTER — Ambulatory Visit: Payer: PPO | Admitting: Cardiology

## 2020-09-28 ENCOUNTER — Encounter: Payer: Self-pay | Admitting: Cardiology

## 2020-09-28 ENCOUNTER — Ambulatory Visit (INDEPENDENT_AMBULATORY_CARE_PROVIDER_SITE_OTHER): Payer: PPO

## 2020-09-28 ENCOUNTER — Other Ambulatory Visit: Payer: Self-pay

## 2020-09-28 VITALS — BP 116/70 | HR 67 | Ht 72.0 in | Wt 211.0 lb

## 2020-09-28 DIAGNOSIS — I2510 Atherosclerotic heart disease of native coronary artery without angina pectoris: Secondary | ICD-10-CM | POA: Diagnosis not present

## 2020-09-28 DIAGNOSIS — R072 Precordial pain: Secondary | ICD-10-CM

## 2020-09-28 DIAGNOSIS — E7800 Pure hypercholesterolemia, unspecified: Secondary | ICD-10-CM | POA: Diagnosis not present

## 2020-09-28 DIAGNOSIS — I10 Essential (primary) hypertension: Secondary | ICD-10-CM

## 2020-09-28 MED ORDER — PERFLUTREN LIPID MICROSPHERE
1.0000 mL | INTRAVENOUS | Status: AC | PRN
Start: 2020-09-28 — End: 2020-09-28
  Administered 2020-09-28: 2 mL via INTRAVENOUS

## 2020-09-28 NOTE — Progress Notes (Signed)
Cardiology Office Note:    Date:  09/28/2020   ID:  [name], DOB Sep 22, 1950, MRN [id number]  PCP:  Susy Frizzle, MD   [facility] Providers Cardiologist:  None     Referring MD: Susy Frizzle, MD   Chief Complaint  Patient presents with   OTher    Follow up post CTA - Patient c.o SOB. Meds reviewed verbally with patient.     History of Present Illness:    [name] is a 70 y.o. male with a hx of hypertension, hyperlipidemia, former smoker x30 years who presents for follow-up.  Previously seen due to chest pain and shortness of breath.  Echocardiogram and coronary CTA was ordered to evaluate presence of CAD.  He underwent coronary CTA showing elevated calcium score and significant LAD disease.  RCA was nondominant with moderate disease.  Still has shortness of breath, and some chest discomfort.  Echocardiogram is pending.  Tolerating current dose of Crestor and Zetia.  Had allergies to Lipitor.  Prior notes Coronary CTA 09/2020, calcium score 728, significant proximal LAD disease.  Moderate proximal RCA disease.   Past Medical History:  Diagnosis Date   Adenomatous colon polyp 2004   Allergy    Diverticulosis 2011   Former smoker    HLD (hyperlipidemia)    Hyperplastic colon polyp 2004   Hypertension    Melanoma in situ [facility])     Past Surgical History:  Procedure Laterality Date   COLONOSCOPY  01/21/2009   SEVERAL    TOTAL KNEE ARTHROPLASTY Right 04/26/2020    Current Medications: Current Meds  Medication Sig   cetirizine (ZYRTEC) 10 MG tablet TAKE 1 TABLET BY MOUTH EVERY DAY   ezetimibe (ZETIA) 10 MG tablet Take 1 tablet (10 mg total) by mouth daily.   losartan (COZAAR) 50 MG tablet Take 1 tablet by mouth daily.   metoprolol succinate (TOPROL-XL) 25 MG 24 hr tablet Take 1 tablet (25 mg total) by mouth daily.   nitroGLYCERIN (NITROSTAT) 0.4 MG SL tablet Place 1 tablet (0.4 mg total) under the tongue every 5 (five) minutes as needed for chest  pain.   rosuvastatin (CRESTOR) 20 MG tablet Take 1 tablet (20 mg total) by mouth daily.     Allergies:   Levaquin [levofloxacin in d5w] and Crestor [rosuvastatin calcium]   Social History   Socioeconomic History   Marital status: Married    Spouse name: Not on file   Number of children: 2   Years of education: Not on file   Highest education level: Not on file  Occupational History   Occupation: part Scientist, clinical (histocompatibility and immunogenetics): [employer]  Tobacco Use   Smoking status: Former    Packs/day: 1.00    Years: 30.00    Pack years: 30.00    Types: Cigarettes, Cigars    Quit date: 05/13/1998    Years since quitting: 22.3   Smokeless tobacco: Current    Types: Chew  Substance and Sexual Activity   Alcohol use: Yes    Comment: freq   Drug use: No   Sexual activity: Not on file  Other Topics Concern   Not on file  Social History Narrative   Not on file   Social Determinants of Health   Financial Resource Strain: Low Risk    Difficulty of Paying Living Expenses: Not very hard  Food Insecurity: Not on file  Transportation Needs: Not on file  Physical Activity: Not on file  Stress: Not on file  Social Connections: Not on file     Family History: The patient's family history includes Diabetes in his daughter; Heart disease in his mother; Lung cancer in his father.  ROS:   Please see the history of present illness.     All other systems reviewed and are negative.  EKGs/Labs/Other Studies Reviewed:    The following studies were reviewed today:   EKG:  EKG not ordered today.    EKG from PCPs office dated 09/12/2020 shows sinus rhythm, frequent PVCs  Recent Labs: 09/12/2020: ALT 11; BUN 18; Creat 1.28; Hemoglobin 14.6; Platelets 324; Potassium 5.0; Sodium 137  Recent Lipid Panel    Component Value Date/Time   CHOL 151 09/12/2020 1611   TRIG 208 (H) 09/12/2020 1611   HDL 35 (L) 09/12/2020 1611   CHOLHDL 4.3 09/12/2020 1611   VLDL 39 (H) 08/23/2015 0812   LDLCALC 86 09/12/2020  1611     Risk Assessment/Calculations:          Physical Exam:    VS:  BP 116/70 (BP Location: Left Arm, Patient Position: Sitting, Cuff Size: Normal)   Pulse 67   Ht 6' (1.829 m)   Wt 211 lb (95.7 kg)   SpO2 95%   BMI 28.62 kg/m     Wt Readings from Last 3 Encounters:  09/28/20 211 lb (95.7 kg)  09/15/20 207 lb (93.9 kg)  09/12/20 208 lb (94.3 kg)     GEN:  Well nourished, well developed in no acute distress HEENT: Normal NECK: No JVD; No carotid bruits LYMPHATICS: No lymphadenopathy CARDIAC: RRR, no murmurs, rubs, gallops RESPIRATORY:  Clear to auscultation without rales, wheezing or rhonchi  ABDOMEN: Soft, non-tender, non-distended MUSCULOSKELETAL:  No edema; No deformity  SKIN: Warm and dry NEUROLOGIC:  Alert and oriented x 3 PSYCHIATRIC:  Normal affect   ASSESSMENT:    1. Coronary artery disease involving native coronary artery of native heart, unspecified whether angina present   2. Primary hypertension   3. Pure hypercholesterolemia     PLAN:    In order of problems listed above:  Chest pain, symptoms consistent with angina.  Risk factors hypertension, hyperlipidemia, former smoker.  Coronary CTA with calcium score 728, severe proximal LAD disease, moderate RCA disease (small nondominant vessel).  We will plan left heart cath.  Obtain echocardiogram as scheduled.   Hypertension, BP controlled.  Continue losartan, Toprol-XL. Hyperlipidemia, low-cholesterol diet.  Continue Crestor, Zetia. Has history of myalgias with Lipitor.  Follow-up after left heart cath.   Shared Decision Making/Informed Consent The risks [stroke (1 in 1000), death (1 in 1000), kidney failure [usually temporary] (1 in 500), bleeding (1 in 200), allergic reaction [possibly serious] (1 in 200)], benefits (diagnostic support and management of coronary artery disease) and alternatives of a cardiac catheterization were discussed in detail with [name] and he is willing to proceed.    Medication Adjustments/Labs and Tests Ordered: Current medicines are reviewed at length with the patient today.  Concerns regarding medicines are outlined above.  Orders Placed This Encounter  Procedures   CBC   Basic metabolic panel     No orders of the defined types were placed in this encounter.    Patient Instructions  Medication Instructions:   Your physician recommends that you continue on your current medications as directed. Please refer to the Current Medication list given to you today.  *If you need a refill on your cardiac medications before your next appointment, please call your pharmacy*   Lab Work:  CBC, BMP drawn in office today.  Testing/Procedures:   You are scheduled for a Cardiac Catheterization on Wednesday, September 14 with Dr. Kathlyn Sacramento.  1. Please arrive at the [facility] (Main Entrance A) at [facility]: [address] [place], [place] [address] at 10:00 AM (This time is two hours before your procedure to ensure your preparation). Free valet parking service is available.   Special note: Every effort is made to have your procedure done on time. Please understand that emergencies sometimes delay scheduled procedures.  2. Diet: Do not eat solid foods after midnight.  The patient may have clear liquids until 5am upon the day of the procedure.  3. Labs: Already drawn in office  4. Medication instructions in preparation for your procedure:    Contrast Allergy: No   On the morning of your procedure, Please take a Aspirin 81 MG and any morning medicines NOT listed above.  You may use sips of water.  5. Plan for one night stay--bring personal belongings. 6. Bring a current list of your medications and current insurance cards. 7. You MUST have a responsible person to drive you home. 8. Someone MUST be with you the first 24 hours after you arrive home or your discharge will be delayed. 9. Please wear clothes that are easy to get on  and off and wear slip-on shoes.  Thank you for allowing [place] to care for you!   -- [place] [facility]    Follow-Up: At [facility] you and your health needs are our priority.  As part of our continuing mission to provide you with exceptional heart care, we have created designated Provider Care Teams.  These Care Teams include your primary Cardiologist (physician) and Advanced Practice Providers (APPs -  Physician Assistants and Nurse Practitioners) who all work together to provide you with the care you need, when you need it.  We recommend signing up for the patient portal called "MyChart".  Sign up information is provided on this After Visit Summary.  MyChart is used to connect with patients for Virtual Visits (Telemedicine).  Patients are able to view lab/test results, encounter notes, upcoming appointments, etc.  Non-urgent messages can be sent to your provider as well.   To learn more about what you can do with MyChart, go to [email or web address].    Your next appointment:   1 month(s)  The format for your next appointment:   In Person  Provider:   Kate Sable, MD   Other Instructions    Signed, Kate Sable, MD  09/28/2020 1:11 PM    [place]

## 2020-09-28 NOTE — Patient Instructions (Signed)
Medication Instructions:   Your physician recommends that you continue on your current medications as directed. Please refer to the Current Medication list given to you today.  *If you need a refill on your cardiac medications before your next appointment, please call your pharmacy*   Lab Work:  CBC, BMP drawn in office today.  Testing/Procedures:   You are scheduled for a Cardiac Catheterization on Wednesday, September 14 with Dr. Kathlyn Sacramento.  1. Please arrive at the [facility] (Main Entrance A) at [facility]: [address] [place], [place] [address] at 10:00 AM (This time is two hours before your procedure to ensure your preparation). Free valet parking service is available.   Special note: Every effort is made to have your procedure done on time. Please understand that emergencies sometimes delay scheduled procedures.  2. Diet: Do not eat solid foods after midnight.  The patient may have clear liquids until 5am upon the day of the procedure.  3. Labs: Already drawn in office  4. Medication instructions in preparation for your procedure:    Contrast Allergy: No   On the morning of your procedure, Please take a Aspirin 81 MG and any morning medicines NOT listed above.  You may use sips of water.  5. Plan for one night stay--bring personal belongings. 6. Bring a current list of your medications and current insurance cards. 7. You MUST have a responsible person to drive you home. 8. Someone MUST be with you the first 24 hours after you arrive home or your discharge will be delayed. 9. Please wear clothes that are easy to get on and off and wear slip-on shoes.  Thank you for allowing [place] to care for you!   -- [place] [facility]    Follow-Up: At [facility], you and your health needs are our priority.  As part of our continuing mission to provide you with exceptional heart care, we have created designated Provider Care Teams.  These  Care Teams include your primary Cardiologist (physician) and Advanced Practice Providers (APPs -  Physician Assistants and Nurse Practitioners) who all work together to provide you with the care you need, when you need it.  We recommend signing up for the patient portal called "MyChart".  Sign up information is provided on this After Visit Summary.  MyChart is used to connect with patients for Virtual Visits (Telemedicine).  Patients are able to view lab/test results, encounter notes, upcoming appointments, etc.  Non-urgent messages can be sent to your provider as well.   To learn more about what you can do with MyChart, go to [email or web address].    Your next appointment:   1 month(s)  The format for your next appointment:   In Person  Provider:   Kate Sable, MD   Other Instructions

## 2020-09-28 NOTE — H&P (View-Only) (Signed)
Cardiology Office Note:    Date:  09/28/2020   ID:  [name], DOB 16-Jul-1950, MRN [id number]  PCP:  Susy Frizzle, MD   [facility] Providers Cardiologist:  None     Referring MD: Susy Frizzle, MD   Chief Complaint  Patient presents with   OTher    Follow up post CTA - Patient c.o SOB. Meds reviewed verbally with patient.     History of Present Illness:    [name] is a 70 y.o. male with a hx of hypertension, hyperlipidemia, former smoker x30 years who presents for follow-up.  Previously seen due to chest pain and shortness of breath.  Echocardiogram and coronary CTA was ordered to evaluate presence of CAD.  He underwent coronary CTA showing elevated calcium score and significant LAD disease.  RCA was nondominant with moderate disease.  Still has shortness of breath, and some chest discomfort.  Echocardiogram is pending.  Tolerating current dose of Crestor and Zetia.  Had allergies to Lipitor.  Prior notes Coronary CTA 09/2020, calcium score 728, significant proximal LAD disease.  Moderate proximal RCA disease.   Past Medical History:  Diagnosis Date   Adenomatous colon polyp 2004   Allergy    Diverticulosis 2011   Former smoker    HLD (hyperlipidemia)    Hyperplastic colon polyp 2004   Hypertension    Melanoma in situ [facility])     Past Surgical History:  Procedure Laterality Date   COLONOSCOPY  01/21/2009   SEVERAL    TOTAL KNEE ARTHROPLASTY Right 04/26/2020    Current Medications: Current Meds  Medication Sig   cetirizine (ZYRTEC) 10 MG tablet TAKE 1 TABLET BY MOUTH EVERY DAY   ezetimibe (ZETIA) 10 MG tablet Take 1 tablet (10 mg total) by mouth daily.   losartan (COZAAR) 50 MG tablet Take 1 tablet by mouth daily.   metoprolol succinate (TOPROL-XL) 25 MG 24 hr tablet Take 1 tablet (25 mg total) by mouth daily.   nitroGLYCERIN (NITROSTAT) 0.4 MG SL tablet Place 1 tablet (0.4 mg total) under the tongue every 5 (five) minutes as needed for chest  pain.   rosuvastatin (CRESTOR) 20 MG tablet Take 1 tablet (20 mg total) by mouth daily.     Allergies:   Levaquin [levofloxacin in d5w] and Crestor [rosuvastatin calcium]   Social History   Socioeconomic History   Marital status: Married    Spouse name: Not on file   Number of children: 2   Years of education: Not on file   Highest education level: Not on file  Occupational History   Occupation: part Scientist, clinical (histocompatibility and immunogenetics): [employer]  Tobacco Use   Smoking status: Former    Packs/day: 1.00    Years: 30.00    Pack years: 30.00    Types: Cigarettes, Cigars    Quit date: 05/13/1998    Years since quitting: 22.3   Smokeless tobacco: Current    Types: Chew  Substance and Sexual Activity   Alcohol use: Yes    Comment: freq   Drug use: No   Sexual activity: Not on file  Other Topics Concern   Not on file  Social History Narrative   Not on file   Social Determinants of Health   Financial Resource Strain: Low Risk    Difficulty of Paying Living Expenses: Not very hard  Food Insecurity: Not on file  Transportation Needs: Not on file  Physical Activity: Not on file  Stress: Not on file  Social Connections: Not on file     Family History: The patient's family history includes Diabetes in his daughter; Heart disease in his mother; Lung cancer in his father.  ROS:   Please see the history of present illness.     All other systems reviewed and are negative.  EKGs/Labs/Other Studies Reviewed:    The following studies were reviewed today:   EKG:  EKG not ordered today.    EKG from PCPs office dated 09/12/2020 shows sinus rhythm, frequent PVCs  Recent Labs: 09/12/2020: ALT 11; BUN 18; Creat 1.28; Hemoglobin 14.6; Platelets 324; Potassium 5.0; Sodium 137  Recent Lipid Panel    Component Value Date/Time   CHOL 151 09/12/2020 1611   TRIG 208 (H) 09/12/2020 1611   HDL 35 (L) 09/12/2020 1611   CHOLHDL 4.3 09/12/2020 1611   VLDL 39 (H) 08/23/2015 0812   LDLCALC 86 09/12/2020  1611     Risk Assessment/Calculations:          Physical Exam:    VS:  BP 116/70 (BP Location: Left Arm, Patient Position: Sitting, Cuff Size: Normal)   Pulse 67   Ht 6' (1.829 m)   Wt 211 lb (95.7 kg)   SpO2 95%   BMI 28.62 kg/m     Wt Readings from Last 3 Encounters:  09/28/20 211 lb (95.7 kg)  09/15/20 207 lb (93.9 kg)  09/12/20 208 lb (94.3 kg)     GEN:  Well nourished, well developed in no acute distress HEENT: Normal NECK: No JVD; No carotid bruits LYMPHATICS: No lymphadenopathy CARDIAC: RRR, no murmurs, rubs, gallops RESPIRATORY:  Clear to auscultation without rales, wheezing or rhonchi  ABDOMEN: Soft, non-tender, non-distended MUSCULOSKELETAL:  No edema; No deformity  SKIN: Warm and dry NEUROLOGIC:  Alert and oriented x 3 PSYCHIATRIC:  Normal affect   ASSESSMENT:    1. Coronary artery disease involving native coronary artery of native heart, unspecified whether angina present   2. Primary hypertension   3. Pure hypercholesterolemia     PLAN:    In order of problems listed above:  Chest pain, symptoms consistent with angina.  Risk factors hypertension, hyperlipidemia, former smoker.  Coronary CTA with calcium score 728, severe proximal LAD disease, moderate RCA disease (small nondominant vessel).  We will plan left heart cath.  Obtain echocardiogram as scheduled.   Hypertension, BP controlled.  Continue losartan, Toprol-XL. Hyperlipidemia, low-cholesterol diet.  Continue Crestor, Zetia. Has history of myalgias with Lipitor.  Follow-up after left heart cath.   Shared Decision Making/Informed Consent The risks [stroke (1 in 1000), death (1 in 1000), kidney failure [usually temporary] (1 in 500), bleeding (1 in 200), allergic reaction [possibly serious] (1 in 200)], benefits (diagnostic support and management of coronary artery disease) and alternatives of a cardiac catheterization were discussed in detail with [name] and he is willing to proceed.    Medication Adjustments/Labs and Tests Ordered: Current medicines are reviewed at length with the patient today.  Concerns regarding medicines are outlined above.  Orders Placed This Encounter  Procedures   CBC   Basic metabolic panel     No orders of the defined types were placed in this encounter.    Patient Instructions  Medication Instructions:   Your physician recommends that you continue on your current medications as directed. Please refer to the Current Medication list given to you today.  *If you need a refill on your cardiac medications before your next appointment, please call your pharmacy*   Lab Work:  CBC, BMP drawn in office today.  Testing/Procedures:   You are scheduled for a Cardiac Catheterization on Wednesday, September 14 with Dr. Kathlyn Sacramento.  1. Please arrive at the [facility] (Main Entrance A) at [facility]: [address] [place], [place] [address] at 10:00 AM (This time is two hours before your procedure to ensure your preparation). Free valet parking service is available.   Special note: Every effort is made to have your procedure done on time. Please understand that emergencies sometimes delay scheduled procedures.  2. Diet: Do not eat solid foods after midnight.  The patient may have clear liquids until 5am upon the day of the procedure.  3. Labs: Already drawn in office  4. Medication instructions in preparation for your procedure:    Contrast Allergy: No   On the morning of your procedure, Please take a Aspirin 81 MG and any morning medicines NOT listed above.  You may use sips of water.  5. Plan for one night stay--bring personal belongings. 6. Bring a current list of your medications and current insurance cards. 7. You MUST have a responsible person to drive you home. 8. Someone MUST be with you the first 24 hours after you arrive home or your discharge will be delayed. 9. Please wear clothes that are easy to get on  and off and wear slip-on shoes.  Thank you for allowing [place] to care for you!   -- [place] [facility]    Follow-Up: At [facility], you and your health needs are our priority.  As part of our continuing mission to provide you with exceptional heart care, we have created designated Provider Care Teams.  These Care Teams include your primary Cardiologist (physician) and Advanced Practice Providers (APPs -  Physician Assistants and Nurse Practitioners) who all work together to provide you with the care you need, when you need it.  We recommend signing up for the patient portal called "MyChart".  Sign up information is provided on this After Visit Summary.  MyChart is used to connect with patients for Virtual Visits (Telemedicine).  Patients are able to view lab/test results, encounter notes, upcoming appointments, etc.  Non-urgent messages can be sent to your provider as well.   To learn more about what you can do with MyChart, go to [email or web address].    Your next appointment:   1 month(s)  The format for your next appointment:   In Person  Provider:   Kate Sable, MD   Other Instructions    Signed, Kate Sable, MD  09/28/2020 1:11 PM    [place]

## 2020-09-29 LAB — BASIC METABOLIC PANEL
BUN/Creatinine Ratio: 15 (ref 10–24)
BUN: 17 mg/dL (ref 8–27)
CO2: 23 mmol/L (ref 20–29)
Calcium: 10 mg/dL (ref 8.6–10.2)
Chloride: 106 mmol/L (ref 96–106)
Creatinine, Ser: 1.17 mg/dL (ref 0.76–1.27)
Glucose: 102 mg/dL — ABNORMAL HIGH (ref 65–99)
Potassium: 5.2 mmol/L (ref 3.5–5.2)
Sodium: 141 mmol/L (ref 134–144)
eGFR: 67 mL/min/{1.73_m2} (ref >59)

## 2020-09-29 LAB — CBC
Hematocrit: 44.2 % (ref 37.5–51.0)
Hemoglobin: 14.8 g/dL (ref 13.0–17.7)
MCH: 28.1 pg (ref 26.6–33.0)
MCHC: 33.5 g/dL (ref 31.5–35.7)
MCV: 84 fL (ref 79–97)
Platelets: 312 10*3/uL (ref 150–450)
RBC: 5.26 x10E6/uL (ref 4.14–5.80)
RDW: 13.2 % (ref 11.6–15.4)
WBC: 8.6 10*3/uL (ref 3.4–10.8)

## 2020-09-30 LAB — ECHOCARDIOGRAM COMPLETE
AR max vel: 3.33 cm2
AV Area VTI: 3.17 cm2
AV Area mean vel: 2.74 cm2
AV Mean grad: 3 mmHg
AV Peak grad: 5.2 mmHg
Ao pk vel: 1.14 m/s
Height: 72 in
P 1/2 time: 557 msec
S' Lateral: 4.4 cm
Weight: 3376 oz

## 2020-10-02 ENCOUNTER — Telehealth: Payer: Self-pay | Admitting: Cardiology

## 2020-10-02 NOTE — Telephone Encounter (Signed)
Patient calling to discuss recent Echo  testing results   Patient aware provider note pending and nurse will call when resulted with POC.    Patient anxious for results given procedure on 9/14.

## 2020-10-03 ENCOUNTER — Telehealth: Payer: Self-pay | Admitting: *Deleted

## 2020-10-03 NOTE — Telephone Encounter (Signed)
The patient has been notified of the result and verbalized understanding.  All questions (if any) were answered. Kavin Leech, RN 10/03/2020 12:52 PM

## 2020-10-03 NOTE — Telephone Encounter (Signed)
Cardiac catheterization scheduled at [facility] for: Wednesday October 04, 2020 11:30 [place] Entrance A [facility]) at: 9:30 AM   No solid food after midnight prior to cath, clear liquids until 5 AM day of procedure.   Morning medications can be taken pre-cath with sips of water including aspirin 81 mg.    Confirmed patient has responsible adult to drive home post procedure and be with patient first 24 hours after arriving home.  Patients are allowed one visitor in the waiting room during the time they are at the hospital for their procedure. Both patient and visitor must wear a mask once they enter the hospital.   Patient reports does not currently have any symptoms concerning for COVID-19 and no household members with COVID-19 like illness.   Reviewed procedure/mask/visitor instructions with patient.

## 2020-10-04 ENCOUNTER — Other Ambulatory Visit (HOSPITAL_COMMUNITY): Payer: Self-pay

## 2020-10-04 ENCOUNTER — Ambulatory Visit (HOSPITAL_COMMUNITY)
Admission: RE | Admit: 2020-10-04 | Discharge: 2020-10-04 | Disposition: A | Discharge status: Home / Self Care | Payer: PPO | Attending: Cardiology | Admitting: Cardiology

## 2020-10-04 ENCOUNTER — Ambulatory Visit (HOSPITAL_COMMUNITY)
Admission: RE | Disposition: A | Discharge status: Home / Self Care | Payer: Self-pay | Source: Home / Self Care | Attending: Cardiology

## 2020-10-04 DIAGNOSIS — Z888 Allergy status to other drugs, medicaments and biological substances status: Secondary | ICD-10-CM | POA: Insufficient documentation

## 2020-10-04 DIAGNOSIS — R931 Abnormal findings on diagnostic imaging of heart and coronary circulation: Secondary | ICD-10-CM | POA: Diagnosis present

## 2020-10-04 DIAGNOSIS — Z87891 Personal history of nicotine dependence: Secondary | ICD-10-CM | POA: Diagnosis not present

## 2020-10-04 DIAGNOSIS — I25119 Atherosclerotic heart disease of native coronary artery with unspecified angina pectoris: Secondary | ICD-10-CM | POA: Diagnosis not present

## 2020-10-04 DIAGNOSIS — Z79899 Other long term (current) drug therapy: Secondary | ICD-10-CM | POA: Diagnosis not present

## 2020-10-04 DIAGNOSIS — Z8249 Family history of ischemic heart disease and other diseases of the circulatory system: Secondary | ICD-10-CM | POA: Insufficient documentation

## 2020-10-04 DIAGNOSIS — Z955 Presence of coronary angioplasty implant and graft: Secondary | ICD-10-CM | POA: Diagnosis not present

## 2020-10-04 DIAGNOSIS — E785 Hyperlipidemia, unspecified: Secondary | ICD-10-CM | POA: Diagnosis not present

## 2020-10-04 DIAGNOSIS — E7800 Pure hypercholesterolemia, unspecified: Secondary | ICD-10-CM | POA: Diagnosis not present

## 2020-10-04 DIAGNOSIS — R0602 Shortness of breath: Secondary | ICD-10-CM | POA: Diagnosis not present

## 2020-10-04 DIAGNOSIS — I10 Essential (primary) hypertension: Secondary | ICD-10-CM | POA: Diagnosis present

## 2020-10-04 DIAGNOSIS — I209 Angina pectoris, unspecified: Secondary | ICD-10-CM | POA: Diagnosis present

## 2020-10-04 DIAGNOSIS — Z95820 Peripheral vascular angioplasty status with implants and grafts: Secondary | ICD-10-CM

## 2020-10-04 DIAGNOSIS — I2510 Atherosclerotic heart disease of native coronary artery without angina pectoris: Secondary | ICD-10-CM

## 2020-10-04 DIAGNOSIS — R9430 Abnormal result of cardiovascular function study, unspecified: Secondary | ICD-10-CM | POA: Diagnosis present

## 2020-10-04 HISTORY — PX: CORONARY STENT INTERVENTION: CATH118234

## 2020-10-04 HISTORY — PX: LEFT HEART CATH AND CORONARY ANGIOGRAPHY: CATH118249

## 2020-10-04 LAB — POCT ACTIVATED CLOTTING TIME: Activated Clotting Time: 271 seconds

## 2020-10-04 SURGERY — LEFT HEART CATH AND CORONARY ANGIOGRAPHY
Anesthesia: LOCAL

## 2020-10-04 MED ORDER — TICAGRELOR 90 MG PO TABS
ORAL_TABLET | ORAL | Status: DC | PRN
  Administered 2020-10-04: 180 mg via ORAL

## 2020-10-04 MED ORDER — FENTANYL CITRATE (PF) 100 MCG/2ML IJ SOLN
INTRAMUSCULAR | Status: AC
  Filled 2020-10-04: qty 2

## 2020-10-04 MED ORDER — HEPARIN (PORCINE) IN NACL 1000-0.9 UT/500ML-% IV SOLN
INTRAVENOUS | Status: AC
  Filled 2020-10-04: qty 1000

## 2020-10-04 MED ORDER — SODIUM CHLORIDE 0.9 % IV SOLN: INTRAVENOUS | Status: DC

## 2020-10-04 MED ORDER — NITROGLYCERIN 1 MG/10 ML FOR IR/CATH LAB
INTRA_ARTERIAL | Status: AC
  Filled 2020-10-04: qty 10

## 2020-10-04 MED ORDER — MIDAZOLAM HCL 2 MG/2ML IJ SOLN
INTRAMUSCULAR | Status: DC | PRN
  Administered 2020-10-04: 1 mg via INTRAVENOUS

## 2020-10-04 MED ORDER — ROSUVASTATIN CALCIUM 40 MG PO TABS
40.0000 mg | ORAL_TABLET | Freq: Every day | ORAL | 0 refills | Status: DC
  Filled 2020-10-04: qty 30, 30d supply, fill #0

## 2020-10-04 MED ORDER — IOHEXOL 350 MG/ML SOLN
INTRAVENOUS | Status: DC | PRN
  Administered 2020-10-04: 105 mL

## 2020-10-04 MED ORDER — NITROGLYCERIN 0.4 MG SL SUBL: 0.4000 mg | SUBLINGUAL_TABLET | SUBLINGUAL | Status: DC | PRN

## 2020-10-04 MED ORDER — ONDANSETRON HCL 4 MG/2ML IJ SOLN: 4.0000 mg | Freq: Four times a day (QID) | INTRAMUSCULAR | Status: DC | PRN

## 2020-10-04 MED ORDER — MIDAZOLAM HCL 2 MG/2ML IJ SOLN
INTRAMUSCULAR | Status: AC
  Filled 2020-10-04: qty 2

## 2020-10-04 MED ORDER — ACETAMINOPHEN 325 MG PO TABS: 650.0000 mg | ORAL_TABLET | ORAL | Status: DC | PRN

## 2020-10-04 MED ORDER — TICAGRELOR 90 MG PO TABS
ORAL_TABLET | ORAL | Status: AC
  Filled 2020-10-04: qty 1

## 2020-10-04 MED ORDER — FENTANYL CITRATE (PF) 100 MCG/2ML IJ SOLN
INTRAMUSCULAR | Status: DC | PRN
  Administered 2020-10-04 (×2): 25 ug via INTRAVENOUS

## 2020-10-04 MED ORDER — SODIUM CHLORIDE 0.9% FLUSH: 3.0000 mL | INTRAVENOUS | Status: DC | PRN

## 2020-10-04 MED ORDER — VERAPAMIL HCL 2.5 MG/ML IV SOLN
INTRAVENOUS | Status: AC
  Filled 2020-10-04: qty 2

## 2020-10-04 MED ORDER — SODIUM CHLORIDE 0.9% FLUSH: 3.0000 mL | Freq: Two times a day (BID) | INTRAVENOUS | Status: DC

## 2020-10-04 MED ORDER — TICAGRELOR 90 MG PO TABS: 90.0000 mg | ORAL_TABLET | Freq: Two times a day (BID) | ORAL | Status: DC

## 2020-10-04 MED ORDER — LABETALOL HCL 5 MG/ML IV SOLN: 10.0000 mg | INTRAVENOUS | Status: DC | PRN

## 2020-10-04 MED ORDER — EZETIMIBE 10 MG PO TABS: 10.0000 mg | ORAL_TABLET | Freq: Every day | ORAL | Status: DC

## 2020-10-04 MED ORDER — HYDRALAZINE HCL 20 MG/ML IJ SOLN: 10.0000 mg | INTRAMUSCULAR | Status: DC | PRN

## 2020-10-04 MED ORDER — SODIUM CHLORIDE 0.9 % IV SOLN: 250.0000 mL | INTRAVENOUS | Status: DC | PRN

## 2020-10-04 MED ORDER — HEPARIN SODIUM (PORCINE) 1000 UNIT/ML IJ SOLN
INTRAMUSCULAR | Status: AC
  Filled 2020-10-04: qty 1

## 2020-10-04 MED ORDER — HEPARIN (PORCINE) IN NACL 1000-0.9 UT/500ML-% IV SOLN
INTRAVENOUS | Status: DC | PRN
  Administered 2020-10-04 (×2): 500 mL

## 2020-10-04 MED ORDER — VERAPAMIL HCL 2.5 MG/ML IV SOLN
INTRAVENOUS | Status: DC | PRN
  Administered 2020-10-04: 10 mL via INTRA_ARTERIAL

## 2020-10-04 MED ORDER — LIDOCAINE HCL (PF) 1 % IJ SOLN
INTRAMUSCULAR | Status: DC | PRN
  Administered 2020-10-04: 2 mL

## 2020-10-04 MED ORDER — LIDOCAINE HCL (PF) 1 % IJ SOLN
INTRAMUSCULAR | Status: AC
  Filled 2020-10-04: qty 30

## 2020-10-04 MED ORDER — HEPARIN SODIUM (PORCINE) 1000 UNIT/ML IJ SOLN
INTRAMUSCULAR | Status: DC | PRN
  Administered 2020-10-04 (×2): 5000 [IU] via INTRAVENOUS

## 2020-10-04 MED ORDER — SODIUM CHLORIDE 0.9 % WEIGHT BASED INFUSION: 1.0000 mL/kg/h | INTRAVENOUS | Status: DC

## 2020-10-04 MED ORDER — TICAGRELOR 90 MG PO TABS
90.0000 mg | ORAL_TABLET | Freq: Two times a day (BID) | ORAL | 2 refills | Status: DC
  Filled 2020-10-04: qty 60, 30d supply, fill #0

## 2020-10-04 MED ORDER — SODIUM CHLORIDE 0.9 % WEIGHT BASED INFUSION: 3.0000 mL/kg/h | INTRAVENOUS | Status: AC

## 2020-10-04 SURGICAL SUPPLY — 21 items
BALLN SAPPHIRE 2.5X12 (BALLOONS) ×2
BALLN SAPPHIRE ~~LOC~~ 3.25X10 (BALLOONS) ×1 IMPLANT
BALLN SCOREFLEX 2.75X15 (BALLOONS) ×2
BALLOON SAPPHIRE 2.5X12 (BALLOONS) IMPLANT
BALLOON SCOREFLEX 2.75X15 (BALLOONS) IMPLANT
CATH OPTITORQUE TIG 4.0 5F (CATHETERS) ×1 IMPLANT
CATH VISTA GUIDE 6FR XBLAD3.5 (CATHETERS) ×1 IMPLANT
DEVICE RAD COMP TR BAND LRG (VASCULAR PRODUCTS) ×1 IMPLANT
GLIDESHEATH SLEND SS 6F .021 (SHEATH) ×1 IMPLANT
GUIDEWIRE INQWIRE 1.5J.035X260 (WIRE) IMPLANT
INQWIRE 1.5J .035X260CM (WIRE) ×2
KIT ENCORE 26 ADVANTAGE (KITS) ×1 IMPLANT
KIT HEART LEFT (KITS) ×3 IMPLANT
PACK CARDIAC CATHETERIZATION (CUSTOM PROCEDURE TRAY) ×3 IMPLANT
SHEATH PROBE COVER 6X72 (BAG) ×1 IMPLANT
STENT SYNERGY XD 3.0X16 (Permanent Stent) IMPLANT
SYNERGY XD 3.0X16 (Permanent Stent) ×2 IMPLANT
SYR MEDRAD MARK 7 150ML (SYRINGE) ×3 IMPLANT
TRANSDUCER W/STOPCOCK (MISCELLANEOUS) ×3 IMPLANT
TUBING CIL FLEX 10 FLL-RA (TUBING) ×3 IMPLANT
WIRE ASAHI PROWATER 180CM (WIRE) ×1 IMPLANT

## 2020-10-04 NOTE — Interval H&P Note (Signed)
History and Physical Interval Note:  10/04/2020 12:28 PM  [name]  has presented today for surgery, with the diagnosis of abnormal cardiac ct with Class II-III Again Sx of DOE/CP.    The various methods of treatment have been discussed with the patient and family. After consideration of risks, benefits and other options for treatment, the patient has consented to  Procedure(s): LEFT HEART CATH AND CORONARY ANGIOGRAPHY (N/A)  PERCUTANEOUS CORONARY INTERVENTION  as a surgical intervention.  The patient's history has been reviewed, patient examined, no change in status, stable for surgery.  I have reviewed the patient's chart and labs.  Questions were answered to the patient's satisfaction.    Cath Lab Visit (complete for each Cath Lab visit)  Clinical Evaluation Leading to the Procedure:   ACS: No.  Non-ACS:    Anginal Classification: CCS III  Anti-ischemic medical therapy: Minimal Therapy (1 class of medications)  Non-Invasive Test Results: High-risk stress test findings: cardiac mortality >3%/year  Prior CABG: No previous CABG    Glenetta Hew

## 2020-10-04 NOTE — Brief Op Note (Signed)
BRIEF CARDIAC CATHETERIZATION AND PERCUTANEOUS CORONARY INTERVENTION REPORT.   10/04/2020 1:45 PM  PCP:  Susy Frizzle, MD              [facility] Cardiologist:  Kate Sable, MD  PROCEDURE:  Procedure(s): LEFT HEART CATH AND CORONARY ANGIOGRAPHY (N/A) CORONARY STENT INTERVENTION  SURGEON:  Surgeon(s) and Role:    * Leonie Man, MD - Primary   PATIENT:  [name]  70 y.o. male with PMH of HTN, HLD and former long-term smoker for greater than 30 years who was evaluated by Kate Sable, MD for exertional chest pain and dyspnea with a coronary CT angiogram which revealed single-vessel CAD with a severe proximal LAD lesion (CT FFR<0.80).  He is therefore referred for invasive valuation cardiac catheterization plus PCI.  Symptoms were felt to be consistent with his class III angina.  PRE-OPERATIVE DIAGNOSIS:  abnormal ct angiogram; progressive class III angina  POST-OPERATIVE DIAGNOSIS:  * No post-op diagnosis entered * Severe single-vessel disease with 80% eccentric calcified lesion in the very proximal/ostial LAD with diffuse mild disease throughout the LAD.Marland Kitchen Successful DES PCI after ScoreFlex scoring balloon angioplasty of ostial and proximal LAD 80 % lesion reduced to 0% (TIMI-3 flow pre and post): Synergy DES 3.0 mm x 16 mm postdilated to 3.3 mm. Normal LV function by echocardiogram and normal LVEDP on cath.  PROCEDURE: Time Out: Verified patient identification, verified procedure, site/side was marked, verified correct patient position, special equipment/implants available, medications/allergies/relevent history reviewed, required imaging and test results available. Performed.  Access:  RIGHT Radial Artery: 6 Fr sheath -- Seldinger technique using Micropuncture Kit Direct ultrasound guidance used.  Permanent image obtained and placed on chart. 10 mL radial cocktail IA; 5000 Units IV Heparin  Left Heart Catheterization: 5Fr Catheters advanced or  exchanged over a J-wire under direct fluoroscopic guidance into the ascending aorta; TIG 4.0 catheter advanced first.  * LV Hemodynamics (no LV Gram): TIG 4.0 Catheter * Left & Right Coronary Artery Cineangiography: TIG 4.0 Catheter   Review of initial angiography revealed: as expected from the coronary CTA, focal eccentric/calcified ostial and proximal LAD 80% stenosis at very small D1-between D1 and D2.  Preparations are made for PTCA and PCI of the LAD. Additional bolus of IV heparin administered to achieve an ACT between 275.  And 300 sec Brilinta 180 mg administered p.o.  Scoring balloon and DES PCI of LAD performed: 6 French XB LAD guide catheter, P[place]ater wire.   Initial balloon 2.5 mm x 12 mm sapphire balloon inflated to 12 atm x 20 seconds.  This did not fully expanded therefore exchanged for a 2.5 mm ax15 mm Scoreflex scoring balloon deployed 14 atm x 30 seconds. Stent: Synergy DES 3.0 x 16 mm -> deployed 16 atm x 30 seconds Post dilation: 3.25 x 12 mm [place] balloon -> 16 atm, 20 seconds -> final diameter 3.3 mm  Upon completion of Angiogaphy, the catheter was removed completely out of the body over a wire, without complication.  Radial sheath removed in the Cardiac Catheterization lab with TR Band placed for hemostasis.  TR Band: 1335 Hours; 14 mL air  MEDICATIONS SQ Lidocaine 3 mL Radial Cocktail: 3 mg Verapmil in 10 mL NS Heparin: Total 10,000 units Brilinta 100 mg p.o. (Brilinta chosen because of irregular calcified ostial LAD lesion.    ANESTHESIA:   local and IV sedation; 2 mL lidocaine, 1 mg Versed, 25 mg fentanyl  EBL:  <20 mL  BLOOD ADMINISTERED:none  DRAINS: none  COUNTS:  YES   DICTATION: .Note written in EPIC  PLAN OF CARE: Discharge to home after PACU -> same-day discharge post PCI 6 months uninterrupted DAPT (ASA/Brilinta) -> after 6 months would continue Brilinta or Plavix monotherapy to complete 1 year minimum.  (Consider longer)  PATIENT DISPOSITION:   PACU - hemodynamically stable.  No complaints   Glenetta Hew, MD

## 2020-10-04 NOTE — Progress Notes (Signed)
Pt ambulated without difficulty or bleeding.   Discharged home with his son who will drive and his mom who will stay with pt x 24 hrs.

## 2020-10-04 NOTE — Progress Notes (Signed)
[id number] Discussed importance of brilinta with stent. Reviewed NTG use, walking for ex,heart healthy food choices, tobacco cessation(chews) and CRP 2. Referral to [facility] made.  Pt voiced understanding. Graylon Good RN BSN 10/04/2020 3:17 PM'

## 2020-10-04 NOTE — Discharge Summary (Signed)
Discharge Summary for Same Day PCI   Patient ID: [name] MRN: [id number]; DOB: 10-12-1950  Admit date: 10/04/2020 Discharge date: 10/04/2020  Primary Care Provider: Susy Frizzle, MD  Primary Cardiologist: Kate Sable, MD  Primary Electrophysiologist:  None   Discharge Diagnoses    Principal Problem:   Abnormal cardiac CT angiography Active Problems:   Hyperlipidemia with target LDL less than 70   Essential hypertension   Angina, class III ([place])   Coronary artery disease involving native coronary artery of native heart    Diagnostic Studies/Procedures    Cardiac Catheterization 10/04/2020:    CULPRIT LESION: Prox LAD to Mid LAD lesion is 80% stenosed.  (Calcified)   Scoring balloon angioplasty was performed using a BALLOON SCOREFLEX 2.75X15.   A drug-eluting stent was successfully placed using a SYNERGY XD 3.0X16 -> postdilated to 3.3 mm.   Post intervention, there is a 0% residual stenosis.   ------------------------------------------------------------------   Dist LAD lesion is 45% stenosed.   Prox RCA to Mid RCA lesion is 75% stenosed.  Small, nondominant vessel.  Not a PCI target.   ------------------------------------------------------------------   LV end diastolic pressure is normal.  There is no aortic valve stenosis.   SUMMARY Severe single-vessel disease with 80% eccentric calcified lesion in the very proximal/ostial LAD with diffuse mild disease throughout the LAD.Marland Kitchen Successful DES PCI after ScoreFlex scoring balloon angioplasty of ostial and proximal LAD 80 % lesion reduced to 0% (TIMI-3 flow pre and post): Synergy DES 3.0 mm x 16 mm postdilated to 3.3 mm. Normal LV function by echocardiogram and normal LVEDP on cath.     RECOMMENDATIONS Okay for same-day discharge after 6 hours Continue to push aggressive GDMT for CAD: We will increase rosuvastatin to 40 mg.  Will defer titration of additional cardiac medications to his primary cardiologist.      Glenetta Hew, MD _____________   History of Present Illness     [name] is a 70 y.o. male with HTN, HLD and prior tobacco use. He has been followed by Dr. Synthia Innocent. Most recently was evaluated in the office for chest pain and shortness of breath. Echo and outpatient coronary CTA. Coronary CTA showed elevated Ca+ score 728 with pLAD disease. RCA was nondominant with moderate disease. He was seen back in the office to discuss and arranged for outpatient catheterization.    Hospital Course     The patient underwent cardiac cath as noted above with severe single vessel with 80% lesion p/ostial LAD with PCI/DES x1. Plan for DAPT with ASA/Brilinta for at least 6 months, then likely plavix '75mg'$  daily vs Brilinta '60mg'$  BID to complete one year. The patient was seen by cardiac rehab while in short stay. There were no observed complications post cath. Radial cath site was re-evaluated prior to discharge and found to be stable without any complications. Instructions/precautions regarding cath site care were given prior to discharge. Also increased his Crestor from 20 to '40mg'$  daily.   [name] was seen by Dr. Ellyn Hack and determined stable for discharge home. Follow up with our office has been arranged. Medications are listed below. Pertinent changes include addition of Brilinta and increase in Crestor from '20mg'$  to '40mg'$  daily.  _____________  Cath/PCI Registry Performance & Quality Measures: Aspirin prescribed? - Yes ADP Receptor Inhibitor (Plavix/Clopidogrel, Brilinta/Ticagrelor or Effient/Prasugrel) prescribed (includes medically managed patients)? - Yes High Intensity Statin (Lipitor 40-'80mg'$  or Crestor 20-'40mg'$ ) prescribed? - Yes For EF <40%, was ACEI/ARB prescribed? - Not Applicable (EF >/=  40%) For EF <40%, Aldosterone Antagonist (Spironolactone or Eplerenone) prescribed? - Not Applicable (EF >/= [id number]) Cardiac Rehab Phase II ordered (Included Medically managed Patients)? -  Yes  _____________   Discharge Vitals Blood pressure (!) 148/75, pulse 71, temperature 98.5 F (36.9 C), temperature source Oral, resp. rate 16, height 6' (1.829 m), weight 95.3 kg, SpO2 99 %.  Filed Weights   10/04/20 0925  Weight: 95.3 kg    Last Labs & Radiologic Studies    CBC No results for input(s): WBC, NEUTROABS, HGB, HCT, MCV, PLT in the last 72 hours. Basic Metabolic Panel No results for input(s): NA, K, CL, CO2, GLUCOSE, BUN, CREATININE, CALCIUM, MG, PHOS in the last 72 hours. Liver Function Tests No results for input(s): AST, ALT, ALKPHOS, BILITOT, PROT, ALBUMIN in the last 72 hours. No results for input(s): LIPASE, AMYLASE in the last 72 hours. High Sensitivity Troponin:   No results for input(s): TROPONINIHS in the last 720 hours.  BNP Invalid input(s): POCBNP D-Dimer No results for input(s): DDIMER in the last 72 hours. Hemoglobin A1C No results for input(s): HGBA1C in the last 72 hours. Fasting Lipid Panel No results for input(s): CHOL, HDL, LDLCALC, TRIG, CHOLHDL, LDLDIRECT in the last 72 hours. Thyroid Function Tests No results for input(s): TSH, T4TOTAL, T3FREE, THYROIDAB in the last 72 hours.  Invalid input(s): FREET3 _____________  CARDIAC CATHETERIZATION  Result Date: 10/04/2020   CULPRIT LESION: Prox LAD to Mid LAD lesion is 80% stenosed.  (Calcified)   Scoring balloon angioplasty was performed using a BALLOON SCOREFLEX 2.75X15.   A drug-eluting stent was successfully placed using a SYNERGY XD 3.0X16 -> postdilated to 3.3 mm.   Post intervention, there is a 0% residual stenosis.   ------------------------------------------------------------------   Dist LAD lesion is 45% stenosed.   Prox RCA to Mid RCA lesion is 75% stenosed.  Small, nondominant vessel.  Not a PCI target.   ------------------------------------------------------------------   LV end diastolic pressure is normal.  There is no aortic valve stenosis. SUMMARY Severe single-vessel disease with  80% eccentric calcified lesion in the very proximal/ostial LAD with diffuse mild disease throughout the LAD.Marland Kitchen Successful DES PCI after ScoreFlex scoring balloon angioplasty of ostial and proximal LAD 80 % lesion reduced to 0% (TIMI-3 flow pre and post): Synergy DES 3.0 mm x 16 mm postdilated to 3.3 mm. Normal LV function by echocardiogram and normal LVEDP on cath. RECOMMENDATIONS Okay for same-day discharge after 6 hours Continue to push aggressive GDMT for CAD: We will increase rosuvastatin to 40 mg.  Will defer titration of additional cardiac medications to his primary cardiologist. Glenetta Hew, MD  CT CORONARY [facility] W/CTA COR W/SCORE Lewanda Rife W/CM &/OR WO/CM  Addendum Date: 09/21/2020   ADDENDUM REPORT: 09/21/2020 14:20 EXAM: OVER-READ INTERPRETATION  CT CHEST The following report is an over-read performed by radiologist Dr. Minerva Fester [facility] [place] on 09/21/2020. This over-read does not include interpretation of cardiac or coronary anatomy or pathology. The calcium score and coronary CTA interpretation by the cardiologist is attached. COMPARISON:  CT of the chest from 2014. FINDINGS: Cardiovascular: See dedicated report for cardiovascular details. Mediastinum/Nodes: Limited imaging of the chest extends to the heart only, imaged portions of the mediastinum without signs of acute finding or adenopathy. Lungs/Pleura: Basilar atelectasis, no sign of effusion or consolidation. Visualized airways are patent. Upper Abdomen: Incidental imaging of upper abdominal contents show no acute findings on very limited assessment. Musculoskeletal: Spinal degenerative changes. No acute or destructive bone process. IMPRESSION: No acute or significant extracardiac  findings. Electronically Signed   By: Zetta Bills M.D.   On: 09/21/2020 14:20   Result Date: 09/21/2020 CLINICAL DATA:  Chest pain EXAM: Cardiac/Coronary  CTA TECHNIQUE: The patient was scanned on a Siemens Somatom go.Top scanner. : A retrospective scan  was triggered in the descending thoracic aorta. Axial non-contrast 3 mm slices were carried out through the heart. The data set was analyzed on a dedicated work station and scored using the [place]. Gantry rotation speed was 330 msecs and collimation was .6 mm. '100mg'$  of metoprolol and 0.8 mg of sl NTG was given. The 3D data set was reconstructed in 5% intervals of the 60-95 % of the R-R cycle. Diastolic phases were analyzed on a dedicated work station using MPR, MIP and VRT modes. The patient received 75 cc of contrast. FINDINGS: Aorta:  Normal size.  No calcifications.  No dissection. Aortic Valve:  Trileaflet.  No calcifications. Coronary Arteries:  Normal coronary origin.  left dominance. RCA is a small non dominant artery. There is calcified plaque in the proximal RCA causing moderate (50-69%) stenosis. Left main is a large artery that gives rise to LAD and LCX arteries. LM has no disease. LAD has calcified plaque in the proximal segment causing severe stenosis (>70%). LCX is a dominant artery that gives rise to one large OM1 branch. It also supplies the PDA. There is no plaque. Other findings: Normal pulmonary vein drainage into the left atrium. Normal left atrial appendage without a thrombus. Normal size of the pulmonary artery. IMPRESSION: 1. Coronary calcium score of 728. This was 78th percentile for age and sex matched control. 2. Normal coronary origin with left dominance. 3. Severe proximal LAD disease (>70%). 4. Moderate disease in proximal RCA (small non dominant vessel) 5. CAD-RADS 4 Severe stenosis. (70-99% or > 50% left main). Cardiac catheterization or CT FFR is recommended. Consider symptom-guided anti-ischemic pharmacotherapy as well as risk factor modification per guideline directed care. Additional analysis with CT FFR will be submitted and reported separately. Electronically Signed: By: Kate Sable M.D. On: 09/21/2020 13:19   CT CORONARY FRACTIONAL FLOW RESERVE FLUID  ANALYSIS  Result Date: 09/22/2020 EXAM: CT FFR ANALYSIS CLINICAL DATA:  Abnormal CCTA FINDINGS: FFRct analysis was performed on the original cardiac CT angiogram dataset. Diagrammatic representation of the FFRct analysis is provided in a separate PDF document in PACS. This dictation was created using the PDF document and an interactive 3D model of the results. 3D model is not available in the EMR/PACS. Normal FFR range is >0.80. 1. Left Main:  No significant stenosis. 2. LAD: significant focal stenosis present in the proximal LAD. FFRct 0.75 3. LCX: No significant stenosis. 4. RCA: significant stenosis present in the proximal to mid LAD. FFRct 0.60. IMPRESSION: 1. CT FFR analysis showed significant stenosis in the proximal LAD and mid RCA (small non dominant vessel). 2.  Recommend left heart catheterization. Electronically Signed   By: Kate Sable M.D.   On: 09/22/2020 16:39   ECHOCARDIOGRAM COMPLETE  Result Date: 09/30/2020    ECHOCARDIOGRAM REPORT   Patient Name:   [name] Date of Exam: 09/28/2020 Medical Rec #:  [id number]    Height:       72.0 in Accession #:    [id number]   Weight:       211.0 lb Date of Birth:  06-08-1950   BSA:          2.180 m Patient Age:    50 years     BP:  116/70 mmHg Patient Gender: M            HR:           70 bpm. Exam Location:  [place] Procedure: 2D Echo, Cardiac Doppler, Color Doppler and Intracardiac            Opacification Agent Indications:     R06.02 SOB; R07.9* Chest pain, unspecified  History:         Patient has no prior history of Echocardiogram examinations.                  Signs/Symptoms:Chest Pain, Shortness of Breath and Diaphoresis;                  Risk Factors:Hypertension, Dyslipidemia and Former Smoker.  Sonographer:     Pilar Jarvis RDMS, RVT, RDCS Referring Phys:  [id number] Kate Sable Diagnosing Phys: Ida Rogue MD IMPRESSIONS  1. Left ventricular ejection fraction, by estimation, is 60 to 65%. The left ventricle has normal  function. The left ventricle has no regional wall motion abnormalities. The left ventricular internal cavity size was mildly dilated. Left ventricular diastolic parameters are consistent with Grade I diastolic dysfunction (impaired relaxation).  2. Right ventricular systolic function is normal. The right ventricular size is normal. There is normal pulmonary artery systolic pressure. The estimated right ventricular systolic pressure is 0000000 mmHg.  3. The mitral valve is normal in structure. Mild mitral valve regurgitation. No evidence of mitral stenosis.  4. The aortic valve is normal in structure. Aortic valve regurgitation is mild. Mild to moderate aortic valve sclerosis/calcification is present, without any evidence of aortic stenosis.  5. The inferior vena cava is normal in size with greater than 50% respiratory variability, suggesting right atrial pressure of 3 mmHg. FINDINGS  Left Ventricle: Left ventricular ejection fraction, by estimation, is 60 to 65%. The left ventricle has normal function. The left ventricle has no regional wall motion abnormalities. Definity contrast agent was given IV to delineate the left ventricular  endocardial borders. The left ventricular internal cavity size was mildly dilated. There is no left ventricular hypertrophy. Left ventricular diastolic parameters are consistent with Grade I diastolic dysfunction (impaired relaxation). Right Ventricle: The right ventricular size is normal. No increase in right ventricular wall thickness. Right ventricular systolic function is normal. There is normal pulmonary artery systolic pressure. The tricuspid regurgitant velocity is 2.44 m/s, and  with an assumed right atrial pressure of 5 mmHg, the estimated right ventricular systolic pressure is 0000000 mmHg. Left Atrium: Left atrial size was normal in size. Right Atrium: Right atrial size was normal in size. Pericardium: There is no evidence of pericardial effusion. Mitral Valve: The mitral valve is  normal in structure. Mild mitral valve regurgitation. No evidence of mitral valve stenosis. Tricuspid Valve: The tricuspid valve is normal in structure. Tricuspid valve regurgitation is mild . No evidence of tricuspid stenosis. Aortic Valve: The aortic valve is normal in structure. Aortic valve regurgitation is mild. Aortic regurgitation PHT measures 557 msec. Mild to moderate aortic valve sclerosis/calcification is present, without any evidence of aortic stenosis. Aortic valve  mean gradient measures 3.0 mmHg. Aortic valve peak gradient measures 5.2 mmHg. Aortic valve area, by VTI measures 3.17 cm. Pulmonic Valve: The pulmonic valve was normal in structure. Pulmonic valve regurgitation is not visualized. No evidence of pulmonic stenosis. Aorta: The aortic root is normal in size and structure. Venous: The inferior vena cava is normal in size with greater than 50% respiratory variability, suggesting right atrial  pressure of 3 mmHg. IAS/Shunts: No atrial level shunt detected by color flow Doppler.  LEFT VENTRICLE PLAX 2D LVIDd:         6.20 cm  Diastology LVIDs:         4.40 cm  LV e' medial:    7.07 cm/s LV PW:         0.70 cm  LV E/e' medial:  11.1 LV IVS:        0.70 cm  LV e' lateral:   8.59 cm/s LVOT diam:     2.30 cm  LV E/e' lateral: 9.2 LV SV:         74 LV SV Index:   34 LVOT Area:     4.15 cm  RIGHT VENTRICLE             IVC RV Basal diam:  3.20 cm     IVC diam: 1.70 cm RV S prime:     13.30 cm/s TAPSE (M-mode): 2.2 cm LEFT ATRIUM             Index       RIGHT ATRIUM           Index LA diam:        4.40 cm 2.02 cm/m  RA Area:     12.50 cm LA Vol (A2C):   57.7 ml 26.47 ml/m RA Volume:   27.70 ml  12.71 ml/m LA Vol (A4C):   47.5 ml 21.79 ml/m LA Biplane Vol: 52.9 ml 24.27 ml/m  AORTIC VALVE                   PULMONIC VALVE AV Area (Vmax):    3.33 cm    PV Vmax:       0.72 m/s AV Area (Vmean):   2.74 cm    PV Peak grad:  2.1 mmHg AV Area (VTI):     3.17 cm AV Vmax:           114.00 cm/s AV Vmean:           87.400 cm/s AV VTI:            0.232 m AV Peak Grad:      5.2 mmHg AV Mean Grad:      3.0 mmHg LVOT Vmax:         91.40 cm/s LVOT Vmean:        57.600 cm/s LVOT VTI:          0.177 m LVOT/AV VTI ratio: 0.76 AI PHT:            557 msec  AORTA Ao Root diam: 3.90 cm Ao Asc diam:  3.20 cm Ao Arch diam: 3.2 cm MV E velocity: 78.80 cm/s  TRICUSPID VALVE MV A velocity: 87.80 cm/s  TR Peak grad:   23.8 mmHg MV E/A ratio:  0.90        TR Vmax:        244.00 cm/s                             SHUNTS                            Systemic VTI:  0.18 m                            Systemic Diam: 2.30  cm Ida Rogue MD Electronically signed by Ida Rogue MD Signature Date/Time: 09/30/2020/10:35:07 PM    Final     Disposition   Pt is being discharged home today in good condition.  Follow-up Plans & Appointments     Follow-up Information     Kathlen Mody, Cadence H, PA-C Follow up on 10/18/2020.   Specialty: Cardiology Why: at 8:30am for your follow up appt with Agbor-Etang's PA Cadance Contact information: [address] [place] [address] [phone]                Discharge Medications   Allergies as of 10/04/2020       Reactions   Levaquin [levofloxacin In D5w] Shortness Of Breath   Crestor [rosuvastatin Calcium] Diarrhea        Medication List     STOP taking these medications    ezetimibe 10 MG tablet Commonly known as: Zetia   tadalafil 5 MG tablet Commonly known as: CIALIS   tiZANidine 4 MG tablet Commonly known as: Zanaflex       TAKE these medications    acetaminophen 500 MG tablet Commonly known as: TYLENOL Take 1,000 mg by mouth every 6 (six) hours as needed for moderate pain.   aspirin EC 81 MG tablet Take 81 mg by mouth daily. Swallow whole.   cetirizine 10 MG tablet Commonly known as: ZYRTEC TAKE 1 TABLET BY MOUTH EVERY DAY   losartan 50 MG tablet Commonly known as: COZAAR Take 1 tablet by mouth daily.   metoprolol succinate 25 MG  24 hr tablet Commonly known as: TOPROL-XL Take 1 tablet (25 mg total) by mouth daily.   nitroGLYCERIN 0.4 MG SL tablet Commonly known as: NITROSTAT Place 1 tablet (0.4 mg total) under the tongue every 5 (five) minutes as needed for chest pain.   rosuvastatin 40 MG tablet Commonly known as: Crestor Take 1 tablet (40 mg total) by mouth daily. What changed:  medication strength how much to take   ticagrelor 90 MG Tabs tablet Commonly known as: BRILINTA Take 1 tablet (90 mg total) by mouth 2 (two) times daily.           Allergies Allergies  Allergen Reactions   Levaquin [Levofloxacin In D5w] Shortness Of Breath   Crestor [Rosuvastatin Calcium] Diarrhea    Outstanding Labs/Studies   FLP/LFTs in 8 weeks   Duration of Discharge Encounter   Greater than 30 minutes including physician time.  Signed, Reino Bellis, NP 10/04/2020, 3:18 PM

## 2020-10-04 NOTE — Discharge Instructions (Signed)
Radial Site Care  This sheet gives you information about how to care for yourself after your procedure. Your health care provider may also give you more specific instructions. If you have problems or questions, contact your health care provider. What can I expect after the procedure? After the procedure, it is common to have: Bruising and tenderness at the catheter insertion area. Follow these instructions at home: Medicines Take over-the-counter and prescription medicines only as told by your health care provider. Insertion site care Follow instructions from your health care provider about how to take care of your insertion site. Make sure you: Wash your hands with soap and water before you remove your bandage (dressing). If soap and water are not available, use hand sanitizer. May remove dressing in 24 hours. Check your insertion site every day for signs of infection. Check for: Redness, swelling, or pain. Fluid or blood. Pus or a bad smell. Warmth. Do no take baths, swim, or use a hot tub for 5 days. You may shower 24-48 hours after the procedure. Remove the dressing and gently wash the site with plain soap and water. Pat the area dry with a clean towel. Do not rub the site. That could cause bleeding. Do not apply powder or lotion to the site. Activity  For 24 hours after the procedure, or as directed by your health care provider: Do not flex or bend the affected arm. Do not push or pull heavy objects with the affected arm. Do not drive yourself home from the hospital or clinic. You may drive 24 hours after the procedure. Do not operate machinery or power tools. KEEP ARM ELEVATED THE REMAINDER OF THE DAY. Do not push, pull or lift anything that is heavier than 10 lb for 5 days. Ask your health care provider when it is okay to: Return to work or school. Resume usual physical activities or sports. Resume sexual activity. General instructions If the catheter site starts to  bleed, raise your arm and put firm pressure on the site. If the bleeding does not stop, get help right away. This is a medical emergency. DRINK PLENTY OF FLUIDS FOR THE NEXT 2-3 DAYS. No alcohol consumption for 24 hours after receiving sedation. If you went home on the same day as your procedure, a responsible adult should be with you for the first 24 hours after you arrive home. Keep all follow-up visits as told by your health care provider. This is important. Contact a health care provider if: You have a fever. You have redness, swelling, or yellow drainage around your insertion site. Get help right away if: You have unusual pain at the radial site. The catheter insertion area swells very fast. The insertion area is bleeding, and the bleeding does not stop when you hold steady pressure on the area. Your arm or hand becomes pale, cool, tingly, or numb. These symptoms may represent a serious problem that is an emergency. Do not wait to see if the symptoms will go away. Get medical help right away. Call your local emergency services (911 in the [place] Do not drive yourself to the hospital. Summary After the procedure, it is common to have bruising and tenderness at the site. Follow instructions from your health care provider about how to take care of your radial site wound. Check the wound every day for signs of infection.  This information is not intended to replace advice given to you by your health care provider. Make sure you discuss any questions you have with   your health care provider. Document Revised: 02/12/2017 Document Reviewed: 02/12/2017 Elsevier Patient Education  2020 Elsevier Inc.  

## 2020-10-05 ENCOUNTER — Encounter (HOSPITAL_COMMUNITY): Payer: Self-pay | Admitting: Cardiology

## 2020-10-05 MED FILL — Nitroglycerin IV Soln 100 MCG/ML in D5W: INTRA_ARTERIAL | Qty: 10 | Status: AC

## 2020-10-09 ENCOUNTER — Telehealth: Payer: PPO

## 2020-10-12 ENCOUNTER — Other Ambulatory Visit: Payer: PPO

## 2020-10-17 NOTE — Progress Notes (Signed)
Cardiology Office Note:    Date:  10/18/2020   ID:  [name], DOB 1950-06-08, MRN [id number]  PCP:  Susy Frizzle, MD  [facility] Cardiologist:  Kate Sable, MD  [facility] Electrophysiologist:  None   Referring MD: Susy Frizzle, MD   Chief Complaint: same day PCI  History of Present Illness:    [name] is a 70 y.o. male with a hx of HTN, HLD, former smoker x30 years who presents for post PCI follow-up.   Echo and coronary CTA was ordered to evaluate presence of CAD. He underwent CTS showing elevated calcium score and significant LAD disease. RCA was nondominant with moderate disease. Still has shortness of breath and some chest discomfort. Echo was still pending. He was ultimately set up for cath.Cath showed severe single vessel disease with 80% eccentric calcified lesion in the very proximal/ostial LAD with diffuse mild disease throughout the LAD treated with successful DES PCI, normal LV function and LVEDP. Atorvastatin was increased to 40mg  daily. He was discharged the same day.   Today,  the reports he has been doing well since the stent. Has occasional chest pain that shoots through the chest, it is sharp shooting pain. It is not worse with exertion and very brief. May come out of nowhere. Not the same pain as before the stent. Has some shortness of breath, however this is unchanged. He plans on starting exercise program. No LLE, orthopnea, pnd, palpitations. No bleeding issues with DAPT. Right wrist cath site stable. 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 Not planning doing cardiac rehab since wife recently broke her foot, maybe able to in the future. He is making diet changed.    Past Medical History:  Diagnosis Date   Adenomatous colon polyp 2004   Allergy    Diverticulosis 2011   Former smoker    HLD (hyperlipidemia)    Hyperplastic colon polyp 2004   Hypertension    Melanoma in situ [facility])     Past Surgical History:  Procedure  Laterality Date   COLONOSCOPY  01/21/2009   SEVERAL    CORONARY STENT INTERVENTION N/A 10/04/2020   Procedure: CORONARY STENT INTERVENTION;  Surgeon: Leonie Man, MD;  Location: [place] [facility];  Service: Cardiovascular;  Laterality: N/A;   LEFT HEART CATH AND CORONARY ANGIOGRAPHY N/A 10/04/2020   Procedure: LEFT HEART CATH AND CORONARY ANGIOGRAPHY;  Surgeon: Leonie Man, MD;  Location: [place] [facility];  Service: Cardiovascular;  Laterality: N/A;   TOTAL KNEE ARTHROPLASTY Right 04/26/2020    Current Medications: No outpatient medications have been marked as taking for the 10/18/20 encounter (Appointment) with Kathlen Mody, Tuyen Uncapher H, PA-C.     Allergies:   Levaquin [levofloxacin in d5w] and Crestor [rosuvastatin calcium]   Social History   Socioeconomic History   Marital status: Married    Spouse name: Not on file   Number of children: 2   Years of education: Not on file   Highest education level: Not on file  Occupational History   Occupation: part Scientist, clinical (histocompatibility and immunogenetics): [employer]  Tobacco Use   Smoking status: Former    Packs/day: 1.00    Years: 30.00    Pack years: 30.00    Types: Cigarettes, Cigars    Quit date: 05/13/1998    Years since quitting: 22.4   Smokeless tobacco: Current    Types: Chew  Substance and Sexual Activity   Alcohol use: Yes  Comment: freq   Drug use: No   Sexual activity: Not on file  Other Topics Concern   Not on file  Social History Narrative   Not on file   Social Determinants of Health   Financial Resource Strain: Low Risk    Difficulty of Paying Living Expenses: Not very hard  Food Insecurity: Not on file  Transportation Needs: Not on file  Physical Activity: Not on file  Stress: Not on file  Social Connections: Not on file     Family History: The patient's family history includes Diabetes in his daughter; Heart disease in his mother; Lung cancer in his father.  ROS:   Please see the history of present illness.     All  other systems reviewed and are negative.  EKGs/Labs/Other Studies Reviewed:    The following studies were reviewed today:  Cardiac cath 10/04/20   CULPRIT LESION: Prox LAD to Mid LAD lesion is 80% stenosed.  (Calcified)   Scoring balloon angioplasty was performed using a BALLOON SCOREFLEX 2.75X15.   A drug-eluting stent was successfully placed using a SYNERGY XD 3.0X16 -> postdilated to 3.3 mm.   Post intervention, there is a 0% residual stenosis.   ------------------------------------------------------------------   Dist LAD lesion is 45% stenosed.   Prox RCA to Mid RCA lesion is 75% stenosed.  Small, nondominant vessel.  Not a PCI target.   ------------------------------------------------------------------   LV end diastolic pressure is normal.  There is no aortic valve stenosis.   SUMMARY Severe single-vessel disease with 80% eccentric calcified lesion in the very proximal/ostial LAD with diffuse mild disease throughout the LAD.Marland Kitchen Successful DES PCI after ScoreFlex scoring balloon angioplasty of ostial and proximal LAD 80 % lesion reduced to 0% (TIMI-3 flow pre and post): Synergy DES 3.0 mm x 16 mm postdilated to 3.3 mm. Normal LV function by echocardiogram and normal LVEDP on cath.     RECOMMENDATIONS Okay for same-day discharge after 6 hours Continue to push aggressive GDMT for CAD: We will increase rosuvastatin to 40 mg.  Will defer titration of additional cardiac medications to his primary cardiologist.     Glenetta Hew, MD    Echo 09/28/20  1. Left ventricular ejection fraction, by estimation, is 60 to 65%. The  left ventricle has normal function. The left ventricle has no regional  wall motion abnormalities. The left ventricular internal cavity size was  mildly dilated. Left ventricular  diastolic parameters are consistent with Grade I diastolic dysfunction  (impaired relaxation).   2. Right ventricular systolic function is normal. The right ventricular  size is normal.  There is normal pulmonary artery systolic pressure. The  estimated right ventricular systolic pressure is 67.5 mmHg.   3. The mitral valve is normal in structure. Mild mitral valve  regurgitation. No evidence of mitral stenosis.   4. The aortic valve is normal in structure. Aortic valve regurgitation is  mild. Mild to moderate aortic valve sclerosis/calcification is present,  without any evidence of aortic stenosis.   5. The inferior vena cava is normal in size with greater than 50%  respiratory variability, suggesting right atrial pressure of 3 mmHg.   EKG:  EKG is  ordered today.  The ekg ordered today demonstrates NSR, 71bpm, TWI III, old, no acute changes  Recent Labs: 09/12/2020: ALT 11 09/28/2020: BUN 17; Creatinine, Ser 1.17; Hemoglobin 14.8; Platelets 312; Potassium 5.2; Sodium 141  Recent Lipid Panel    Component Value Date/Time   CHOL 151 09/12/2020 1611   TRIG 208 (H)  09/12/2020 1611   HDL 35 (L) 09/12/2020 1611   CHOLHDL 4.3 09/12/2020 1611   VLDL 39 (H) 08/23/2015 0812   LDLCALC 86 09/12/2020 1611   Physical Exam:    VS:  There were no vitals taken for this visit.    Wt Readings from Last 3 Encounters:  10/04/20 210 lb (95.3 kg)  09/28/20 211 lb (95.7 kg)  09/15/20 207 lb (93.9 kg)     GEN: Well nourished, well developed in no acute distress HEENT: Normal NECK: No JVD; No carotid bruits LYMPHATICS: No lymphadenopathy CARDIAC: RRR, no murmurs, rubs, gallops RESPIRATORY:  diminished at bases ABDOMEN: Soft, non-tender, non-distended MUSCULOSKELETAL:  No edema; No deformity  SKIN: Warm and dry NEUROLOGIC:  Alert and oriented x 3 PSYCHIATRIC:  Normal affect   ASSESSMENT:    1. Coronary artery disease involving native coronary artery of native heart, unspecified whether angina present   2. Essential hypertension   3. Hyperlipidemia, mixed    PLAN:    In order of problems listed above:  CAD s/p DES/PCI to LAD Patient has been doing well since LHC. Right  wrist cath site healed well. He has rare sharp shooting chest pain, not similar prior to stenting. Has chronic DOE that is unchanged. He denies bleeding issues on DAPT, check CBC today. He is unable to do cardiac rehab at this time since he is caring for his wife who broke her foot, but is willing to re-visit at follow-up. 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 He plans on slowly getting back into activity with walking a couple times a week. He has SL NTG at home. We discussed Imdur for chest pain, but since pain is rare, plan to re-evaluate symptoms at follow-up. Continue Aspirin, Birlinta, Crestor, Toprol.   HTN BP good today. Continue Toprol-XL 25mg  daily and Losartan 50mg  daily.  HLD LDL 86 08/2020. Crestor increase to 40mg  daily. Can re-check lipid panel/LFTs at follow-up.  Disposition: Follow up in 1 month(s) with MD/APP    Signed, Kynadie Yaun Ninfa Meeker, PA-C  10/18/2020 7:50 AM    [place]

## 2020-10-18 ENCOUNTER — Other Ambulatory Visit (HOSPITAL_COMMUNITY): Payer: Self-pay

## 2020-10-18 ENCOUNTER — Telehealth (HOSPITAL_COMMUNITY): Payer: Self-pay

## 2020-10-18 ENCOUNTER — Encounter: Payer: Self-pay | Admitting: Medical

## 2020-10-18 ENCOUNTER — Ambulatory Visit: Payer: PPO | Admitting: Medical

## 2020-10-18 ENCOUNTER — Other Ambulatory Visit: Payer: Self-pay

## 2020-10-18 VITALS — BP 122/74 | HR 71 | Ht 72.0 in | Wt 211.0 lb

## 2020-10-18 DIAGNOSIS — I10 Essential (primary) hypertension: Secondary | ICD-10-CM | POA: Diagnosis not present

## 2020-10-18 DIAGNOSIS — I2510 Atherosclerotic heart disease of native coronary artery without angina pectoris: Secondary | ICD-10-CM

## 2020-10-18 DIAGNOSIS — E782 Mixed hyperlipidemia: Secondary | ICD-10-CM | POA: Diagnosis not present

## 2020-10-18 NOTE — Patient Instructions (Signed)
Medication Instructions:  Your physician recommends that you continue on your current medications as directed. Please refer to the Current Medication list given to you today.  *If you need a refill on your cardiac medications before your next appointment, please call your pharmacy*   Lab Work: None ordered If you have labs (blood work) drawn today and your tests are completely normal, you will receive your results only by: [place] (if you have MyChart) OR A paper copy in the mail If you have any lab test that is abnormal or we need to change your treatment, we will call you to review the results.   Testing/Procedures:  CBC drawn in office today.   Follow-Up: At [facility], you and your health needs are our priority.  As part of our continuing mission to provide you with exceptional heart care, we have created designated Provider Care Teams.  These Care Teams include your primary Cardiologist (physician) and Advanced Practice Providers (APPs -  Physician Assistants and Nurse Practitioners) who all work together to provide you with the care you need, when you need it.  We recommend signing up for the patient portal called "MyChart".  Sign up information is provided on this After Visit Summary.  MyChart is used to connect with patients for Virtual Visits (Telemedicine).  Patients are able to view lab/test results, encounter notes, upcoming appointments, etc.  Non-urgent messages can be sent to your provider as well.   To learn more about what you can do with MyChart, go to [email or web address].    Your next appointment:   1 month(s)  The format for your next appointment:   In Person  Provider:   You may see Kate Sable, MD or one of the following Advanced Practice Providers on your designated Care Team:   Murray Hodgkins, NP Christell Faith, PA-C Marrianne Mood, PA-C Cadence Kathlen Mody, [place]   Other Instructions

## 2020-10-18 NOTE — Telephone Encounter (Signed)
Transitions of Care Pharmacy  ° °Call attempted for a pharmacy transitions of care follow-up. HIPAA appropriate voicemail was left with call back information provided.  ° °Call attempt #1. Will follow-up in 2-3 days.  °  °

## 2020-10-19 LAB — CBC
Hematocrit: 41.8 % (ref 37.5–51.0)
Hemoglobin: 14 g/dL (ref 13.0–17.7)
MCH: 27.8 pg (ref 26.6–33.0)
MCHC: 33.5 g/dL (ref 31.5–35.7)
MCV: 83 fL (ref 79–97)
Platelets: 309 10*3/uL (ref 150–450)
RBC: 5.03 x10E6/uL (ref 4.14–5.80)
RDW: 13.1 % (ref 11.6–15.4)
WBC: 10.2 10*3/uL (ref 3.4–10.8)

## 2020-10-23 ENCOUNTER — Encounter: Payer: Self-pay | Admitting: Family Medicine

## 2020-10-26 ENCOUNTER — Telehealth (HOSPITAL_COMMUNITY): Payer: Self-pay

## 2020-10-26 NOTE — Telephone Encounter (Signed)
Transitions of Care Pharmacy   Call attempted for a pharmacy transitions of care follow-up. HIPAA appropriate voicemail was left with call back information provided.   Call attempt #2. Will follow-up in 2-3 days.    

## 2020-10-27 ENCOUNTER — Telehealth (HOSPITAL_COMMUNITY): Payer: Self-pay

## 2020-10-27 ENCOUNTER — Ambulatory Visit: Payer: PPO | Admitting: Cardiology

## 2020-10-27 ENCOUNTER — Other Ambulatory Visit (HOSPITAL_COMMUNITY): Payer: Self-pay

## 2020-10-27 NOTE — Telephone Encounter (Signed)
Pharmacy Transitions of Care Follow-up Telephone Call  Date of discharge: 10/04/20  Discharge Diagnosis: angioplasty with stent  How have you been since you were released from the hospital?  Patient doing well. Has experienced some shortness of breath. Explained that could be a possible side effect of Brilinta and that caffeine can help. No questions about meds at this time.  Medication changes made at discharge:           START taking: Brilinta (ticagrelor)  CHANGE how you take:               Rosuvastatin           STOP taking: ezetimibe 10 MG tablet (Zetia)  tadalafil 5 MG tablet (CIALIS)  tiZANidine 4 MG tablet (Zanaflex)   Medication changes verified by the patient? Yes    Medication Accessibility:  Home Pharmacy:  [facility] [place]  Was the patient provided with refills on discharged medications? Yes   Have all prescriptions been transferred from [facility] to home pharmacy?  Yes  Is the patient able to afford medications? Has insurance    Medication Review:  TICAGRELOR (BRILINTA) Ticagrelor 90 mg BID initiated on 10/04/20.  - Educated patient on expected duration of therapy of aspirin with ticagrelor.  - Discussed importance of taking medication around the same time every day, - Advised patient of medications to avoid (NSAIDs, aspirin maintenance doses>100 mg daily) - Educated that Tylenol (acetaminophen) will be the preferred analgesic to prevent risk of bleeding  - Emphasized importance of monitoring for signs and symptoms of bleeding (abnormal bruising, prolonged bleeding, nose bleeds, bleeding from gums, discolored urine, black tarry stools)  - Educated patient to notify doctor if shortness of breath or abnormal heartbeat occur - Advised patient to alert all providers of antiplatelet therapy prior to starting a new medication or having a procedure    Follow-up Appointments:  PCP Hospital f/u appt confirmed?  Scheduled to see PCP on 12/28/20 @ Family Medicine.    [facility] f/u appt confirmed?  Scheduled to see Dr.Furth on 10/18/20 @ Cardiology.   If their condition worsens, is the pt aware to call PCP or go to the Emergency Dept.? Yes  Final Patient Assessment: Patient has follow up scheduled and refills at home pharmacy

## 2020-11-02 ENCOUNTER — Other Ambulatory Visit: Payer: Self-pay | Admitting: Family Medicine

## 2020-11-02 MED ORDER — AZITHROMYCIN 250 MG PO TABS: ORAL_TABLET | ORAL | 0 refills | Status: DC

## 2020-11-03 ENCOUNTER — Encounter: Payer: Self-pay | Admitting: Family Medicine

## 2020-11-03 ENCOUNTER — Other Ambulatory Visit: Payer: Self-pay

## 2020-11-03 ENCOUNTER — Ambulatory Visit (INDEPENDENT_AMBULATORY_CARE_PROVIDER_SITE_OTHER): Payer: PPO | Admitting: Family Medicine

## 2020-11-03 VITALS — BP 126/62 | HR 80 | Temp 98.3°F | Resp 16 | Ht 72.0 in | Wt 210.0 lb

## 2020-11-03 DIAGNOSIS — J069 Acute upper respiratory infection, unspecified: Secondary | ICD-10-CM

## 2020-11-03 MED ORDER — AZITHROMYCIN 250 MG PO TABS: ORAL_TABLET | ORAL | 0 refills | Status: DC

## 2020-11-03 MED ORDER — TIZANIDINE HCL 4 MG PO TABS: 4.0000 mg | ORAL_TABLET | Freq: Four times a day (QID) | ORAL | 0 refills | Status: DC | PRN

## 2020-11-03 MED ORDER — HYDROCODONE BIT-HOMATROP MBR 5-1.5 MG/5ML PO SOLN
5.0000 mL | Freq: Three times a day (TID) | ORAL | 0 refills | Status: DC | PRN
Start: 2020-11-03 — End: 2021-04-09

## 2020-11-03 NOTE — Progress Notes (Signed)
Subjective:    Patient ID: [name], male    DOB: [birth year], 70 y.o.   MRN: [id number]  HPI Patient reports a 2 to 3-week history of cough.  The cough is productive of clear or yellow mucus.  He denies any chest pain.  He denies any shortness of breath.  He denies any pleurisy.  He denies any hemoptysis.  He denies any fevers.  He denies any sinus pain or otalgia.  He did have some runny nose but that has improved.  He requests a Z-Pak for sinusitis Past Medical History:  Diagnosis Date   Adenomatous colon polyp 2004   Allergy    Diverticulosis 2011   Former smoker    HLD (hyperlipidemia)    Hyperplastic colon polyp 2004   Hypertension    Melanoma in situ [facility])    Past Surgical History:  Procedure Laterality Date   COLONOSCOPY  01/21/2009   SEVERAL    CORONARY STENT INTERVENTION N/A 10/04/2020   Procedure: CORONARY STENT INTERVENTION;  Surgeon: Leonie Man, MD;  Location: [place] [facility];  Service: Cardiovascular;  Laterality: N/A;   LEFT HEART CATH AND CORONARY ANGIOGRAPHY N/A 10/04/2020   Procedure: LEFT HEART CATH AND CORONARY ANGIOGRAPHY;  Surgeon: Leonie Man, MD;  Location: [place] [facility];  Service: Cardiovascular;  Laterality: N/A;   TOTAL KNEE ARTHROPLASTY Right 04/26/2020   Current Outpatient Medications on File Prior to Visit  Medication Sig Dispense Refill   aspirin EC 81 MG tablet Take 81 mg by mouth daily. Swallow whole.     cetirizine (ZYRTEC) 10 MG tablet TAKE 1 TABLET BY MOUTH EVERY DAY 90 tablet 3   ezetimibe (ZETIA) 10 MG tablet Take 10 mg by mouth daily.     losartan (COZAAR) 50 MG tablet Take 1 tablet by mouth daily.     metoprolol succinate (TOPROL-XL) 25 MG 24 hr tablet Take 1 tablet (25 mg total) by mouth daily. 90 tablet 3   nitroGLYCERIN (NITROSTAT) 0.4 MG SL tablet Place 1 tablet (0.4 mg total) under the tongue every 5 (five) minutes as needed for chest pain. 50 tablet 3   rosuvastatin (CRESTOR) 40 MG tablet Take 1 tablet (40 mg  total) by mouth daily. 90 tablet 0   ticagrelor (BRILINTA) 90 MG TABS tablet Take 1 tablet (90 mg total) by mouth 2 (two) times daily. 180 tablet 2   No current facility-administered medications on file prior to visit.   Allergies  Allergen Reactions   Levaquin [Levofloxacin In D5w] Shortness Of Breath   Crestor [Rosuvastatin Calcium] Diarrhea   Social History   Socioeconomic History   Marital status: Married    Spouse name: Not on file   Number of children: 2   Years of education: Not on file   Highest education level: Not on file  Occupational History   Occupation: part Scientist, clinical (histocompatibility and immunogenetics): [employer]  Tobacco Use   Smoking status: Former    Packs/day: 1.00    Years: 30.00    Pack years: 30.00    Types: Cigarettes, Cigars    Quit date: 05/13/1998    Years since quitting: 22.4   Smokeless tobacco: Current    Types: Chew  Substance and Sexual Activity   Alcohol use: Yes    Comment: freq   Drug use: No   Sexual activity: Not on file  Other Topics Concern   Not on file  Social History Narrative   Not on file   Social  Determinants of Health   Financial Resource Strain: Low Risk    Difficulty of Paying Living Expenses: Not very hard  Food Insecurity: Not on file  Transportation Needs: Not on file  Physical Activity: Not on file  Stress: Not on file  Social Connections: Not on file  Intimate Partner Violence: Not on file      Review of Systems  All other systems reviewed and are negative.     Objective:   Physical Exam Vitals reviewed.  Constitutional:      General: He is not in acute distress.    Appearance: He is well-developed. He is not diaphoretic.  HENT:     Right Ear: Tympanic membrane, ear canal and external ear normal.     Left Ear: Tympanic membrane, ear canal and external ear normal.     Nose: No mucosal edema, congestion or rhinorrhea.     Right Sinus: No maxillary sinus tenderness or frontal sinus tenderness.     Left Sinus: No maxillary sinus  tenderness or frontal sinus tenderness.     Mouth/Throat:     Pharynx: No oropharyngeal exudate.  Eyes:     Conjunctiva/sclera: Conjunctivae normal.     Pupils: Pupils are equal, round, and reactive to light.  Cardiovascular:     Rate and Rhythm: Normal rate and regular rhythm.     Heart sounds: Normal heart sounds.  Pulmonary:     Effort: Pulmonary effort is normal. No respiratory distress.     Breath sounds: Normal breath sounds. No wheezing or rales.  Musculoskeletal:     Cervical back: Neck supple.  Lymphadenopathy:     Cervical: No cervical adenopathy.          Assessment & Plan:  Viral URI I explained to the patient that this is most likely a viral upper respiratory infection or even viral bronchitis.  I do not feel that a Z-Pak would be beneficial to him.  Instead I will treat the cough with Hycodan 1 teaspoon every 6 hours as needed.  I gave him the Z-Pak in good faith and explained to him about antibiotic resistance.  I asked him just to hold onto it and only get the antibiotic if he develops a fever chills shortness of breath or purulent sputum.  Continues to have some low back pain in the right flank.  I did give the patient a prescription for tizanidine to be used at night for muscle spasms

## 2020-11-09 ENCOUNTER — Other Ambulatory Visit: Payer: Self-pay

## 2020-11-09 ENCOUNTER — Ambulatory Visit (INDEPENDENT_AMBULATORY_CARE_PROVIDER_SITE_OTHER): Payer: PPO

## 2020-11-09 VITALS — Ht 72.0 in | Wt 210.0 lb

## 2020-11-09 DIAGNOSIS — Z0000 Encounter for general adult medical examination without abnormal findings: Secondary | ICD-10-CM | POA: Diagnosis not present

## 2020-11-09 NOTE — Patient Instructions (Signed)
[name] , Thank you for taking time to come for your Medicare Wellness Visit. I appreciate your ongoing commitment to your health goals. Please review the following plan we discussed and let me know if I can assist you in the future.   Screening recommendations/referrals: Colonoscopy: Done 12/09/2009, Repeat in 10 years  Recommended yearly ophthalmology/optometry visit for glaucoma screening and checkup Recommended yearly dental visit for hygiene and checkup  Vaccinations: Influenza vaccine: Done 10/11/20 Repeat annually  Pneumococcal vaccine: Done 07/01/2014 and 11/30/2019 Tdap vaccine: Done 05/18/2013 Repeat in 10 years Shingles vaccine: Shingrix discussed. Please contact your pharmacy for coverage information.     Covid-19: Declined  Advanced directives: Advance directive discussed with you today. I have provided a copy for you to complete at home and have notarized. Once this is complete please bring a copy in to our office so we can scan it into your chart.   Conditions/risks identified: Aim for 30 minutes of exercise or brisk walking each day, drink 6-8 glasses of water and eat lots of fruits and vegetables.  Next appointment: Follow up in one year for your annual wellness visit. 2023.  Preventive Care 34 Years and Older, Male  Preventive care refers to lifestyle choices and visits with your health care provider that can promote health and wellness. What does preventive care include? A yearly physical exam. This is also called an annual well check. Dental exams once or twice a year. Routine eye exams. Ask your health care provider how often you should have your eyes checked. Personal lifestyle choices, including: Daily care of your teeth and gums. Regular physical activity. Eating a healthy diet. Avoiding tobacco and drug use. Limiting alcohol use. Practicing safe sex. Taking low doses of aspirin every day. Taking vitamin and mineral supplements as recommended by your  health care provider. What happens during an annual well check? The services and screenings done by your health care provider during your annual well check will depend on your age, overall health, lifestyle risk factors, and family history of disease. Counseling  Your health care provider may ask you questions about your: Alcohol use. Tobacco use. Drug use. Emotional well-being. Home and relationship well-being. Sexual activity. Eating habits. History of falls. Memory and ability to understand (cognition). Work and work Statistician. Screening  You may have the following tests or measurements: Height, weight, and BMI. Blood pressure. Lipid and cholesterol levels. These may be checked every 5 years, or more frequently if you are over 61 years old. Skin check. Lung cancer screening. You may have this screening every year starting at age 62 if you have a 30-pack-year history of smoking and currently smoke or have quit within the past 15 years. Fecal occult blood test (FOBT) of the stool. You may have this test every year starting at age 66. Flexible sigmoidoscopy or colonoscopy. You may have a sigmoidoscopy every 5 years or a colonoscopy every 10 years starting at age 32. Prostate cancer screening. Recommendations will vary depending on your family history and other risks. Hepatitis C blood test. Hepatitis B blood test. Sexually transmitted disease (STD) testing. Diabetes screening. This is done by checking your blood sugar (glucose) after you have not eaten for a while (fasting). You may have this done every 1-3 years. Abdominal aortic aneurysm (AAA) screening. You may need this if you are a current or former smoker. Osteoporosis. You may be screened starting at age 47 if you are at high risk. Talk with your health care provider about your test  results, treatment options, and if necessary, the need for more tests. Vaccines  Your health care provider may recommend certain vaccines, such  as: Influenza vaccine. This is recommended every year. Tetanus, diphtheria, and acellular pertussis (Tdap, Td) vaccine. You may need a Td booster every 10 years. Zoster vaccine. You may need this after age 42. Pneumococcal 13-valent conjugate (PCV13) vaccine. One dose is recommended after age 2. Pneumococcal polysaccharide (PPSV23) vaccine. One dose is recommended after age 38. Talk to your health care provider about which screenings and vaccines you need and how often you need them. This information is not intended to replace advice given to you by your health care provider. Make sure you discuss any questions you have with your health care provider. Document Released: 02/03/2015 Document Revised: 09/27/2015 Document Reviewed: 11/08/2014 Elsevier Interactive Patient Education  2017 [place] Prevention in the Home Falls can cause injuries. They can happen to people of all ages. There are many things you can do to make your home safe and to help prevent falls. What can I do on the outside of my home? Regularly fix the edges of walkways and driveways and fix any cracks. Remove anything that might make you trip as you walk through a door, such as a raised step or threshold. Trim any bushes or trees on the path to your home. Use bright outdoor lighting. Clear any walking paths of anything that might make someone trip, such as rocks or tools. Regularly check to see if handrails are loose or broken. Make sure that both sides of any steps have handrails. Any raised decks and porches should have guardrails on the edges. Have any leaves, snow, or ice cleared regularly. Use sand or salt on walking paths during winter. Clean up any spills in your garage right away. This includes oil or grease spills. What can I do in the bathroom? Use night lights. Install grab bars by the toilet and in the tub and shower. Do not use towel bars as grab bars. Use non-skid mats or decals in the tub or  shower. If you need to sit down in the shower, use a plastic, non-slip stool. Keep the floor dry. Clean up any water that spills on the floor as soon as it happens. Remove soap buildup in the tub or shower regularly. Attach bath mats securely with double-sided non-slip rug tape. Do not have throw rugs and other things on the floor that can make you trip. What can I do in the bedroom? Use night lights. Make sure that you have a light by your bed that is easy to reach. Do not use any sheets or blankets that are too big for your bed. They should not hang down onto the floor. Have a firm chair that has side arms. You can use this for support while you get dressed. Do not have throw rugs and other things on the floor that can make you trip. What can I do in the kitchen? Clean up any spills right away. Avoid walking on wet floors. Keep items that you use a lot in easy-to-reach places. If you need to reach something above you, use a strong step stool that has a grab bar. Keep electrical cords out of the way. Do not use floor polish or wax that makes floors slippery. If you must use wax, use non-skid floor wax. Do not have throw rugs and other things on the floor that can make you trip. What can I do with my stairs?  Do not leave any items on the stairs. Make sure that there are handrails on both sides of the stairs and use them. Fix handrails that are broken or loose. Make sure that handrails are as long as the stairways. Check any carpeting to make sure that it is firmly attached to the stairs. Fix any carpet that is loose or worn. Avoid having throw rugs at the top or bottom of the stairs. If you do have throw rugs, attach them to the floor with carpet tape. Make sure that you have a light switch at the top of the stairs and the bottom of the stairs. If you do not have them, ask someone to add them for you. What else can I do to help prevent falls? Wear shoes that: Do not have high heels. Have  rubber bottoms. Are comfortable and fit you well. Are closed at the toe. Do not wear sandals. If you use a stepladder: Make sure that it is fully opened. Do not climb a closed stepladder. Make sure that both sides of the stepladder are locked into place. Ask someone to hold it for you, if possible. Clearly mark and make sure that you can see: Any grab bars or handrails. First and last steps. Where the edge of each step is. Use tools that help you move around (mobility aids) if they are needed. These include: Canes. Walkers. Scooters. Crutches. Turn on the lights when you go into a dark area. Replace any light bulbs as soon as they burn out. Set up your furniture so you have a clear path. Avoid moving your furniture around. If any of your floors are uneven, fix them. If there are any pets around you, be aware of where they are. Review your medicines with your doctor. Some medicines can make you feel dizzy. This can increase your chance of falling. Ask your doctor what other things that you can do to help prevent falls. This information is not intended to replace advice given to you by your health care provider. Make sure you discuss any questions you have with your health care provider. Document Released: 11/03/2008 Document Revised: 06/15/2015 Document Reviewed: 02/11/2014 Elsevier Interactive Patient Education  2017 [employer]

## 2020-11-09 NOTE — Progress Notes (Signed)
Subjective:   [name] is a 70 y.o. male who presents for Medicare Annual/Subsequent preventive examination. Virtual Visit via Telephone Note  I connected with  [name] on 11/09/20 at  2:00 PM EDT by telephone and verified that I am speaking with the correct person using two identifiers.  Location: Patient: Home Provider: [facility] Persons participating in the virtual visit: patient/Nurse Health Advisor   I discussed the limitations, risks, security and privacy concerns of performing an evaluation and management service by telephone and the availability of in person appointments. The patient expressed understanding and agreed to proceed.  Interactive audio and video telecommunications were attempted between this nurse and patient, however failed, due to patient having technical difficulties OR patient did not have access to video capability.  We continued and completed visit with audio only.  Some vital signs may be absent or patient reported.   Michael Driver, LPN  Review of Systems     Cardiac Risk Factors include: advanced age (>69men, >79 women);hypertension;dyslipidemia;sedentary lifestyle;male gender     Objective:    Today's Vitals   11/09/20 1355 11/09/20 1358  Weight: 210 lb (95.3 kg)   Height: 6' (1.829 m)   PainSc:  0-No pain   Body mass index is 28.48 kg/m.  Advanced Directives 11/09/2020 10/04/2020 11/16/2019 06/20/2016  Does Patient Have a Medical Advance Directive? No No Yes No  Type of Advance Directive - - Healthcare Power of Attorney -  Would patient like information on creating a medical advance directive? No - Patient declined No - Patient declined - Yes (MAU/Ambulatory/Procedural Areas - Information given)    Current Medications (verified) Outpatient Encounter Medications as of 11/09/2020  Medication Sig   aspirin EC 81 MG tablet Take 81 mg by mouth daily. Swallow whole.   cetirizine (ZYRTEC) 10 MG tablet TAKE 1 TABLET BY MOUTH EVERY DAY    ezetimibe (ZETIA) 10 MG tablet Take 10 mg by mouth daily.   losartan (COZAAR) 50 MG tablet Take 1 tablet by mouth daily.   metoprolol succinate (TOPROL-XL) 25 MG 24 hr tablet Take 1 tablet (25 mg total) by mouth daily.   nitroGLYCERIN (NITROSTAT) 0.4 MG SL tablet Place 1 tablet (0.4 mg total) under the tongue every 5 (five) minutes as needed for chest pain.   rosuvastatin (CRESTOR) 40 MG tablet Take 1 tablet (40 mg total) by mouth daily.   ticagrelor (BRILINTA) 90 MG TABS tablet Take 1 tablet (90 mg total) by mouth 2 (two) times daily.   tiZANidine (ZANAFLEX) 4 MG tablet Take 1 tablet (4 mg total) by mouth every 6 (six) hours as needed for muscle spasms.   azithromycin (ZITHROMAX) 250 MG tablet 2 tabs poqday1, 1 tab poqday 2-5 (Patient not taking: Reported on 11/09/2020)   HYDROcodone bit-homatropine (HYCODAN) 5-1.5 MG/5ML syrup Take 5 mLs by mouth every 8 (eight) hours as needed for cough. (Patient not taking: Reported on 11/09/2020)   No facility-administered encounter medications on file as of 11/09/2020.    Allergies (verified) Levaquin [levofloxacin in d5w] and Crestor [rosuvastatin calcium]   History: Past Medical History:  Diagnosis Date   Adenomatous colon polyp 2004   Allergy    Diverticulosis 2011   Former smoker    HLD (hyperlipidemia)    Hyperplastic colon polyp 2004   Hypertension    Melanoma in situ [facility])    Past Surgical History:  Procedure Laterality Date   COLONOSCOPY  01/21/2009   SEVERAL    CORONARY STENT INTERVENTION N/A 10/04/2020  Procedure: CORONARY STENT INTERVENTION;  Surgeon: Leonie Man, MD;  Location: [place] [facility];  Service: Cardiovascular;  Laterality: N/A;   LEFT HEART CATH AND CORONARY ANGIOGRAPHY N/A 10/04/2020   Procedure: LEFT HEART CATH AND CORONARY ANGIOGRAPHY;  Surgeon: Leonie Man, MD;  Location: [place] [facility];  Service: Cardiovascular;  Laterality: N/A;   TOTAL KNEE ARTHROPLASTY Right 04/26/2020   Family History   Problem Relation Age of Onset   Lung cancer Father    Heart disease Mother        died in her 38's   Diabetes Daughter    Social History   Socioeconomic History   Marital status: Married    Spouse name: [name]   Number of children: 2   Years of education: Not on file   Highest education level: Not on file  Occupational History   Occupation: part Scientist, clinical (histocompatibility and immunogenetics): [employer]  Tobacco Use   Smoking status: Former    Packs/day: 1.00    Years: 30.00    Pack years: 30.00    Types: Cigarettes, Cigars    Quit date: 05/13/1998    Years since quitting: 22.5   Smokeless tobacco: Current    Types: Chew  Substance and Sexual Activity   Alcohol use: Yes    Comment: freq   Drug use: No   Sexual activity: Not Currently  Other Topics Concern   Not on file  Social History Narrative   Married x 64 years   2 grandsons   Social Determinants of Health   Financial Resource Strain: Low Risk    Difficulty of Paying Living Expenses: Not hard at all  Food Insecurity: No Food Insecurity   Worried About Charity fundraiser in the Last Year: Never true   Arboriculturist in the Last Year: Never true  Transportation Needs: No Transportation Needs   Lack of Transportation (Medical): No   Lack of Transportation (Non-Medical): No  Physical Activity: Sufficiently Active   Days of Exercise per Week: 5 days   Minutes of Exercise per Session: 30 min  Stress: No Stress Concern Present   Feeling of Stress : Not at all  Social Connections: Socially Integrated   Frequency of Communication with Friends and Family: Three times a week   Frequency of Social Gatherings with Friends and Family: Three times a week   Attends Religious Services: More than 4 times per year   Active Member of Clubs or Organizations: Yes   Attends Music therapist: More than 4 times per year   Marital Status: Married    Tobacco Counseling Ready to quit: Not Answered Counseling given: Not Answered   Clinical  Intake:  Pre-visit preparation completed: Yes  Pain : No/denies pain Pain Score: 0-No pain     BMI - recorded: 28.48 Nutritional Status: BMI 25 -29 Overweight Nutritional Risks: None Diabetes: No  How often do you need to have someone help you when you read instructions, pamphlets, or other written materials from your doctor or pharmacy?: 1 - Never  Diabetic?No  Interpreter Needed?: No  Information entered by :: [place] of Daily Living In your present state of health, do you have any difficulty performing the following activities: 11/09/2020 11/16/2019  Hearing? N N  Vision? N N  Difficulty concentrating or making decisions? N N  Walking or climbing stairs? N N  Dressing or bathing? N N  Doing errands, shopping? N [employer]  and eating ? N N  Using the Toilet? N N  In the past six months, have you accidently leaked urine? N N  Do you have problems with loss of bowel control? N N  Managing your Medications? N N  Managing your Finances? N N  Housekeeping or managing your Housekeeping? N N  Some recent data might be hidden    Patient Care Team: Susy Frizzle, MD as PCP - General (Family Medicine) Kate Sable, MD as PCP - Cardiology (Cardiology) Edythe Clarity, [facility] as Pharmacist (Pharmacist)  Indicate any recent Medical Services you may have received from other than [facility] providers in the past year (date may be approximate).     Assessment:   This is a routine wellness examination for [facility].  Hearing/Vision screen Hearing Screening - Comments:: No hearing issues.  Vision Screening - Comments:: Readers. [facility] in [place]. Overdue.  Dietary issues and exercise activities discussed: Current Exercise Habits: Home exercise routine, Type of exercise: walking, Time (Minutes): 30, Frequency (Times/Week): 5, Weekly Exercise (Minutes/Week): 150, Intensity: Mild, Exercise limited by: cardiac condition(s)   Goals Addressed              This Visit's Progress    Exercise 3x per week (30 min per time)       Lose 10 more pounds        Depression Screen PHQ 2/9 Scores 11/09/2020 11/16/2019 11/27/2017 03/20/2017 04/09/2016 08/03/2014 06/25/2013  PHQ - 2 Score 0 0 0 0 0 0 2  PHQ- 9 Score - - - - 0 - 12    Fall Risk Fall Risk  11/09/2020 11/16/2019 12/16/2018 11/27/2017 03/20/2017  Falls in the past year? 0 0 0 0 No  Comment - - Emmi Telephone Survey: data to providers prior to load - -  Number falls in past yr: 0 0 - - -  Injury with Fall? 0 0 - - -  Risk for fall due to : No Fall Risks - - - -  Follow up Falls prevention discussed - - Falls evaluation completed -    FALL RISK PREVENTION PERTAINING TO THE HOME:  Any stairs in or around the home? No  If so, are there any without handrails? No  Home free of loose throw rugs in walkways, pet beds, electrical cords, etc? Yes  Adequate lighting in your home to reduce risk of falls? Yes   ASSISTIVE DEVICES UTILIZED TO PREVENT FALLS:  Life alert? No  Use of a cane, walker or w/c? No  Grab bars in the bathroom? Yes  Shower chair or bench in shower? No  Elevated toilet seat or a handicapped toilet? Yes   TIMED UP AND GO:  Was the test performed? No . Phone visit.   Cognitive Function:     6CIT Screen 11/09/2020  What Year? 0 points  What month? 0 points  What time? 0 points  Count back from 20 0 points  Months in reverse 0 points  Repeat phrase 0 points  Total Score 0    Immunizations Immunization History  Administered Date(s) Administered   Influenza,inj,Quad PF,6+ Mos 12/16/2012, 11/03/2014   Influenza-Unspecified 10/21/2016   Pneumococcal Conjugate-13 07/01/2014   Pneumococcal Polysaccharide-23 06/19/2012, 11/30/2019   Td 06/12/2001   Tdap 05/18/2013    TDAP status: Up to date  Flu Vaccine status: Up to date  Pneumococcal vaccine status: Up to date  Covid-19 vaccine status: Declined, Education has been provided regarding the  importance of this vaccine but patient still declined.  Advised may receive this vaccine at local pharmacy or Health Dept.or vaccine clinic. Aware to provide a copy of the vaccination record if obtained from local pharmacy or Health Dept. Verbalized acceptance and understanding.  Qualifies for Shingles Vaccine? Yes   Zostavax completed No   Shingrix Completed?: No.    Education has been provided regarding the importance of this vaccine. Patient has been advised to call insurance company to determine out of pocket expense if they have not yet received this vaccine. Advised may also receive vaccine at local pharmacy or Health Dept. Verbalized acceptance and understanding.  Screening Tests Health Maintenance  Topic Date Due   COVID-19 Vaccine (1) Never done   Zoster Vaccines- Shingrix (1 of 2) Never done   COLONOSCOPY (Pts 45-29yrs Insurance coverage will need to be confirmed)  11/30/2019   INFLUENZA VACCINE  08/21/2020   TETANUS/TDAP  05/19/2023   Pneumonia Vaccine 14+ Years old  Completed   Hepatitis C Screening  Completed   HPV VACCINES  Aged Out    Health Maintenance  Health Maintenance Due  Topic Date Due   COVID-19 Vaccine (1) Never done   Zoster Vaccines- Shingrix (1 of 2) Never done   COLONOSCOPY (Pts 45-64yrs Insurance coverage will need to be confirmed)  11/30/2019   INFLUENZA VACCINE  08/21/2020    Colorectal cancer screening: Type of screening: Colonoscopy. Completed 12/09/2009. Repeat every 10 years  Lung Cancer Screening: (Low Dose CT Chest recommended if Age 74-80 years, 30 pack-year currently smoking OR have quit w/in 15years.) does not qualify.   Additional Screening:  Hepatitis C Screening: does qualify; Completed 03/20/2017  Vision Screening: Recommended annual ophthalmology exams for early detection of glaucoma and other disorders of the eye. Is the patient up to date with their annual eye exam?  Yes  Who is the provider or what is the name of the office in which  the patient attends annual eye exams? [facility] If pt is not established with a provider, would they like to be referred to a provider to establish care? No .   Dental Screening: Recommended annual dental exams for proper oral hygiene  Community Resource Referral / Chronic Care Management: CRR required this visit?  No   CCM required this visit?  No      Plan:     I have personally reviewed and noted the following in the patient's chart:   Medical and social history Use of alcohol, tobacco or illicit drugs  Current medications and supplements including opioid prescriptions. Patient is not currently taking opioid prescriptions. Functional ability and status Nutritional status Physical activity Advanced directives List of other physicians Hospitalizations, surgeries, and ER visits in previous 12 months Vitals Screenings to include cognitive, depression, and falls Referrals and appointments  In addition, I have reviewed and discussed with patient certain preventive protocols, quality metrics, and best practice recommendations. A written personalized care plan for preventive services as well as general preventive health recommendations were provided to patient.     Michael Driver, LPN   [id number]   Nurse Notes: Pt states he is doing well. Still works for the [employer] Pt declines Colonoscopy/Cologuard at this time. Pt states he would like to wait until the first of the year to schedule. Received flu vaccine at work. 6CIT score of 0.

## 2020-11-16 ENCOUNTER — Ambulatory Visit: Payer: PPO | Admitting: Cardiology

## 2020-11-16 ENCOUNTER — Other Ambulatory Visit: Payer: Self-pay

## 2020-11-16 ENCOUNTER — Encounter: Payer: Self-pay | Admitting: Cardiology

## 2020-11-16 VITALS — BP 120/70 | HR 72 | Ht 72.0 in | Wt 213.0 lb

## 2020-11-16 DIAGNOSIS — E785 Hyperlipidemia, unspecified: Secondary | ICD-10-CM | POA: Diagnosis not present

## 2020-11-16 DIAGNOSIS — I2510 Atherosclerotic heart disease of native coronary artery without angina pectoris: Secondary | ICD-10-CM

## 2020-11-16 DIAGNOSIS — I10 Essential (primary) hypertension: Secondary | ICD-10-CM

## 2020-11-16 MED ORDER — ISOSORBIDE MONONITRATE ER 30 MG PO TB24: 15.0000 mg | ORAL_TABLET | Freq: Every day | ORAL | 5 refills | Status: DC

## 2020-11-16 NOTE — Progress Notes (Signed)
Cardiology Office Note:    Date:  11/16/2020   ID:  [name], DOB 02-25-1950, MRN [id number]  PCP:  Susy Frizzle, MD   [facility] Providers Cardiologist:  Kate Sable, MD     Referring MD: Susy Frizzle, MD   Chief Complaint  Patient presents with   Other    1 month follow up. Meds reviewed verbally with patient.     History of Present Illness:    [name] is a 70 y.o. male with a hx of CAD s/p PCI p LAD, 09/2020, hypertension, hyperlipidemia, former smoker x30 years who presents for follow-up.  Previously seen for chest pain, coronary CTA showed significant proximal LAD disease.  Underwent left heart cath, drug-eluting stent placed to the LAD.  Tolerating aspirin, Brilinta, Crestor as prescribed.  Has occasional right-sided discomfort, not associated with exertion, thinks he might of pulled a muscle.  Right-sided discomfort is different from initial chest pain which prompted coronary CTA and heart cath.  He feels fatigued and tired, lacks energy.  Otherwise he feels okay.  Prior notes Echo 09/28/2020 EF 60 to 65%, impaired relaxation. Left heart cath 10/04/2020 proximal to mid LAD 80% stenosis, s/p drug-eluting stent.  RCA 75% stenosis, small nondominant.  Not PCI target. Coronary CTA 09/2020, calcium score 728, significant proximal LAD disease.  Moderate proximal RCA disease.   Past Medical History:  Diagnosis Date   Adenomatous colon polyp 2004   Allergy    Diverticulosis 2011   Former smoker    HLD (hyperlipidemia)    Hyperplastic colon polyp 2004   Hypertension    Melanoma in situ [facility])     Past Surgical History:  Procedure Laterality Date   COLONOSCOPY  01/21/2009   SEVERAL    CORONARY STENT INTERVENTION N/A 10/04/2020   Procedure: CORONARY STENT INTERVENTION;  Surgeon: Leonie Man, MD;  Location: [place] [facility];  Service: Cardiovascular;  Laterality: N/A;   LEFT HEART CATH AND CORONARY ANGIOGRAPHY N/A 10/04/2020   Procedure: LEFT  HEART CATH AND CORONARY ANGIOGRAPHY;  Surgeon: Leonie Man, MD;  Location: [place] [facility];  Service: Cardiovascular;  Laterality: N/A;   TOTAL KNEE ARTHROPLASTY Right 04/26/2020    Current Medications: Current Meds  Medication Sig   aspirin EC 81 MG tablet Take 81 mg by mouth daily. Swallow whole.   cetirizine (ZYRTEC) 10 MG tablet TAKE 1 TABLET BY MOUTH EVERY DAY   ezetimibe (ZETIA) 10 MG tablet Take 10 mg by mouth daily.   HYDROcodone bit-homatropine (HYCODAN) 5-1.5 MG/5ML syrup Take 5 mLs by mouth every 8 (eight) hours as needed for cough.   isosorbide mononitrate (IMDUR) 30 MG 24 hr tablet Take 0.5 tablets (15 mg total) by mouth daily.   losartan (COZAAR) 50 MG tablet Take 1 tablet by mouth daily.   nitroGLYCERIN (NITROSTAT) 0.4 MG SL tablet Place 1 tablet (0.4 mg total) under the tongue every 5 (five) minutes as needed for chest pain.   rosuvastatin (CRESTOR) 40 MG tablet Take 1 tablet (40 mg total) by mouth daily.   ticagrelor (BRILINTA) 90 MG TABS tablet Take 1 tablet (90 mg total) by mouth 2 (two) times daily.   tiZANidine (ZANAFLEX) 4 MG tablet Take 1 tablet (4 mg total) by mouth every 6 (six) hours as needed for muscle spasms.   [DISCONTINUED] metoprolol succinate (TOPROL-XL) 25 MG 24 hr tablet Take 1 tablet (25 mg total) by mouth daily.     Allergies:   Levaquin [levofloxacin in d5w]  and Crestor [rosuvastatin calcium]   Social History   Socioeconomic History   Marital status: Married    Spouse name: [name]   Number of children: 2   Years of education: Not on file   Highest education level: Not on file  Occupational History   Occupation: part Scientist, clinical (histocompatibility and immunogenetics): [employer]  Tobacco Use   Smoking status: Former    Packs/day: 1.00    Years: 30.00    Pack years: 30.00    Types: Cigarettes, Cigars    Quit date: 05/13/1998    Years since quitting: 22.5   Smokeless tobacco: Current    Types: Chew  Substance and Sexual Activity   Alcohol use: Yes    Comment: freq    Drug use: No   Sexual activity: Not Currently  Other Topics Concern   Not on file  Social History Narrative   Married x 45 years   2 grandsons   Social Determinants of Health   Financial Resource Strain: Low Risk    Difficulty of Paying Living Expenses: Not hard at all  Food Insecurity: No Food Insecurity   Worried About Charity fundraiser in the Last Year: Never true   Arboriculturist in the Last Year: Never true  Transportation Needs: No Transportation Needs   Lack of Transportation (Medical): No   Lack of Transportation (Non-Medical): No  Physical Activity: Sufficiently Active   Days of Exercise per Week: 5 days   Minutes of Exercise per Session: 30 min  Stress: No Stress Concern Present   Feeling of Stress : Not at all  Social Connections: Socially Integrated   Frequency of Communication with Friends and Family: Three times a week   Frequency of Social Gatherings with Friends and Family: Three times a week   Attends Religious Services: More than 4 times per year   Active Member of Clubs or Organizations: Yes   Attends Music therapist: More than 4 times per year   Marital Status: Married     Family History: The patient's family history includes Diabetes in his daughter; Heart disease in his mother; Lung cancer in his father.  ROS:   Please see the history of present illness.     All other systems reviewed and are negative.  EKGs/Labs/Other Studies Reviewed:    The following studies were reviewed today:   EKG:  EKG not ordered today.     Recent Labs: 09/12/2020: ALT 11 09/28/2020: BUN 17; Creatinine, Ser 1.17; Potassium 5.2; Sodium 141 10/18/2020: Hemoglobin 14.0; Platelets 309  Recent Lipid Panel    Component Value Date/Time   CHOL 151 09/12/2020 1611   TRIG 208 (H) 09/12/2020 1611   HDL 35 (L) 09/12/2020 1611   CHOLHDL 4.3 09/12/2020 1611   VLDL 39 (H) 08/23/2015 0812   LDLCALC 86 09/12/2020 1611     Risk Assessment/Calculations:           Physical Exam:    VS:  BP 120/70 (BP Location: Right Arm, Patient Position: Sitting, Cuff Size: Normal)   Pulse 72   Ht 6' (1.829 m)   Wt 213 lb (96.6 kg)   SpO2 96%   BMI 28.89 kg/m     Wt Readings from Last 3 Encounters:  11/16/20 213 lb (96.6 kg)  11/09/20 210 lb (95.3 kg)  11/03/20 210 lb (95.3 kg)     GEN:  Well nourished, well developed in no acute distress HEENT: Normal NECK: No JVD; No carotid bruits LYMPHATICS:  No lymphadenopathy CARDIAC: RRR, no murmurs, rubs, gallops RESPIRATORY:  Clear to auscultation without rales, wheezing or rhonchi  ABDOMEN: Soft, non-tender, non-distended MUSCULOSKELETAL:  No edema; No deformity  SKIN: Warm and dry NEUROLOGIC:  Alert and oriented x 3 PSYCHIATRIC:  Normal affect   ASSESSMENT:    1. Coronary artery disease involving native coronary artery of native heart, unspecified whether angina present   2. Hyperlipidemia with target LDL less than 70   3. Primary hypertension     PLAN:    In order of problems listed above:  CAD, s/p DES to proximal LAD 09/2020.  Echo with preserved EF 60 to 65% impaired relaxation.  Continue aspirin, Brilinta, Crestor, Zetia,.  Stop Toprol-XL due to fatigue, start Imdur 15 mg daily. Hyperlipidemia, goal LDL less than 70.  Continue Crestor, Zetia. Hypertension, BP controlled.  Continue losartan, start Imdur, stop Toprol-XL.  Follow-up in 3 months.   Medication Adjustments/Labs and Tests Ordered: Current medicines are reviewed at length with the patient today.  Concerns regarding medicines are outlined above.  No orders of the defined types were placed in this encounter.    Meds ordered this encounter  Medications   isosorbide mononitrate (IMDUR) 30 MG 24 hr tablet    Sig: Take 0.5 tablets (15 mg total) by mouth daily.    Dispense:  30 tablet    Refill:  5    MD is okay with cutting this in half      Patient Instructions  Medication Instructions:   Your physician has recommended  you make the following change in your medication:    STOP taking Metoprolol Succinate (Toprol XL)  2.    START taking IMDUR 15 MG once a day.  *If you need a refill on your cardiac medications before your next appointment, please call your pharmacy*   Lab Work: None ordered If you have labs (blood work) drawn today and your tests are completely normal, you will receive your results only by: [place] (if you have MyChart) OR A paper copy in the mail If you have any lab test that is abnormal or we need to change your treatment, we will call you to review the results.   Testing/Procedures: None ordered   Follow-Up: At [facility], you and your health needs are our priority.  As part of our continuing mission to provide you with exceptional heart care, we have created designated Provider Care Teams.  These Care Teams include your primary Cardiologist (physician) and Advanced Practice Providers (APPs -  Physician Assistants and Nurse Practitioners) who all work together to provide you with the care you need, when you need it.  We recommend signing up for the patient portal called "MyChart".  Sign up information is provided on this After Visit Summary.  MyChart is used to connect with patients for Virtual Visits (Telemedicine).  Patients are able to view lab/test results, encounter notes, upcoming appointments, etc.  Non-urgent messages can be sent to your provider as well.   To learn more about what you can do with MyChart, go to [email or web address].    Your next appointment:   3 month(s)  The format for your next appointment:   In Person  Provider:   You may see Kate Sable, MD or one of the following Advanced Practice Providers on your designated Care Team:   Murray Hodgkins, NP Christell Faith, PA-C Marrianne Mood, PA-C Cadence [place], [place]   Other Instructions    Signed, Kate Sable, MD  11/16/2020 12:34 PM  [facility]

## 2020-11-16 NOTE — Patient Instructions (Addendum)
Medication Instructions:   Your physician has recommended you make the following change in your medication:    STOP taking Metoprolol Succinate (Toprol XL)  2.    START taking IMDUR 15 MG once a day.  *If you need a refill on your cardiac medications before your next appointment, please call your pharmacy*   Lab Work: None ordered If you have labs (blood work) drawn today and your tests are completely normal, you will receive your results only by: [place] (if you have MyChart) OR A paper copy in the mail If you have any lab test that is abnormal or we need to change your treatment, we will call you to review the results.   Testing/Procedures: None ordered   Follow-Up: At [facility], you and your health needs are our priority.  As part of our continuing mission to provide you with exceptional heart care, we have created designated Provider Care Teams.  These Care Teams include your primary Cardiologist (physician) and Advanced Practice Providers (APPs -  Physician Assistants and Nurse Practitioners) who all work together to provide you with the care you need, when you need it.  We recommend signing up for the patient portal called "MyChart".  Sign up information is provided on this After Visit Summary.  MyChart is used to connect with patients for Virtual Visits (Telemedicine).  Patients are able to view lab/test results, encounter notes, upcoming appointments, etc.  Non-urgent messages can be sent to your provider as well.   To learn more about what you can do with MyChart, go to [email or web address].    Your next appointment:   3 month(s)  The format for your next appointment:   In Person  Provider:   You may see Kate Sable, MD or one of the following Advanced Practice Providers on your designated Care Team:   Murray Hodgkins, NP Christell Faith, PA-C Marrianne Mood, PA-C Cadence Kathlen [place], [place]   Other Instructions

## 2020-11-22 ENCOUNTER — Telehealth: Payer: Self-pay | Admitting: *Deleted

## 2020-11-22 NOTE — Telephone Encounter (Signed)
Received call from patient.   Reports that he was seen on 11/03/2020 for cough and chest congestion. States that he was given Z-Pack and Hycodan cough syrup.   States that nonproductive cough continues and worsens at night. States that he is having drainage from URI.   States that she is still using Hycodan as needed.   Please advise.

## 2020-11-23 ENCOUNTER — Other Ambulatory Visit: Payer: Self-pay | Admitting: Family Medicine

## 2020-11-23 MED ORDER — AMOXICILLIN-POT CLAVULANATE 875-125 MG PO TABS: 1.0000 | ORAL_TABLET | Freq: Two times a day (BID) | ORAL | 0 refills | Status: DC

## 2020-11-23 MED ORDER — PREDNISONE 20 MG PO TABS: ORAL_TABLET | ORAL | 0 refills | Status: DC

## 2020-11-23 NOTE — Telephone Encounter (Signed)
Call placed to patient. LMTRC.  

## 2020-11-23 NOTE — Telephone Encounter (Signed)
Call placed to patient and patient made aware.  

## 2020-11-26 ENCOUNTER — Other Ambulatory Visit: Payer: Self-pay | Admitting: Family Medicine

## 2020-11-27 ENCOUNTER — Other Ambulatory Visit: Payer: Self-pay

## 2020-11-27 ENCOUNTER — Encounter: Payer: Self-pay | Admitting: Cardiology

## 2020-11-27 ENCOUNTER — Ambulatory Visit: Payer: PPO | Admitting: Cardiology

## 2020-11-27 ENCOUNTER — Telehealth: Payer: Self-pay | Admitting: Cardiology

## 2020-11-27 VITALS — BP 136/60 | HR 59 | Ht 72.0 in | Wt 215.0 lb

## 2020-11-27 DIAGNOSIS — I2510 Atherosclerotic heart disease of native coronary artery without angina pectoris: Secondary | ICD-10-CM

## 2020-11-27 DIAGNOSIS — R0602 Shortness of breath: Secondary | ICD-10-CM

## 2020-11-27 DIAGNOSIS — E785 Hyperlipidemia, unspecified: Secondary | ICD-10-CM

## 2020-11-27 DIAGNOSIS — I10 Essential (primary) hypertension: Secondary | ICD-10-CM

## 2020-11-27 MED ORDER — ISOSORBIDE MONONITRATE ER 30 MG PO TB24: 30.0000 mg | ORAL_TABLET | Freq: Every day | ORAL | 5 refills | Status: DC

## 2020-11-27 MED ORDER — PANTOPRAZOLE SODIUM 40 MG PO TBEC: 40.0000 mg | DELAYED_RELEASE_TABLET | Freq: Every day | ORAL | 5 refills | Status: DC

## 2020-11-27 NOTE — Telephone Encounter (Signed)
Pt c/o of Chest Pain: STAT if CP now or developed within 24 hours  1. Are you having CP right now? Not bad  2. Are you experiencing any other symptoms (ex. SOB, nausea, vomiting, sweating)?  SOB, exhausted, feels like he has indigestion  3. How long have you been experiencing CP?  Since last Wednesday  4. Is your CP continuous or coming and going?  Comes and goes  5. Have you taken Nitroglycerin? Yes, last night about 12:30 am ?
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
## 2020-11-27 NOTE — Telephone Encounter (Signed)
Incoming triage call received.  Patient called to report increased intermittent chest pain that started last week Wednesday. Patient sts that the pain is located in the upper left side of his chest. He describes the pain as dull. Patient sts that the chest pain is worse with exertion and improves with rest. Patient reports increased DOE, he denies n/v, diaphoresis.  He take take Nitro x1  last night prior to going to bed and did have improvement in his chest pain. He has not needed to take Nitro this morning. His chest discomfort is at his baseline. He has been scheduled to see his cardiologist Dr. Garen Lah this afternoon at 1:40pm. He does have Nitro on hand, I have instructed him how and when to take it. Adv the patient to keep his appt if his symptoms are stable. Adv the patient that he is to seek emergent care for worsening chest pain/cardiac symptoms not relived with Nitro x2. Patient verbalized understanding and voiced appreciation for the assistance.
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
## 2020-11-27 NOTE — Patient Instructions (Signed)
Medication Instructions:  Your physician has recommended you make the following change in your medication:    INCREASE your Isosorbide (IMDUR) to 30 MG once a day.  2.    START taking Protonix 40 MG once a day.  *If you need a refill on your cardiac medications before your next appointment, please call your pharmacy*   Lab Work:  CBC< BMP drawn in office today  If you have labs (blood work) drawn today and your tests are completely normal, you will receive your results only by: [place] (if you have MyChart) OR A paper copy in the mail If you have any lab test that is abnormal or we need to change your treatment, we will call you to review the results.   Testing/Procedures:  You are scheduled for a Cardiac Catheterization on Thursday, November 10 with Dr. Glenetta Hew.  1. Please arrive at the [place] at 10:30 AM (This time is two hours before your procedure to ensure your preparation). Free valet parking service is available.   Special note: Every effort is made to have your procedure done on time. Please understand that emergencies sometimes delay scheduled procedures.  2. Diet: Do not eat solid foods after midnight.  The patient may have clear liquids until 5am upon the day of the procedure.  3. Labs: Already drawn in office on 11/27/20  4. Medication instructions in preparation for your procedure:   Contrast Allergy: No   On the morning of your procedure, take your Aspirin and any morning medicines NOT listed above.  You may use sips of water.  5. Plan for one night stay--bring personal belongings. 6. Bring a current list of your medications and current insurance cards. 7. You MUST have a responsible person to drive you home. 8. Someone MUST be with you the first 24 hours after you arrive home or your discharge will be delayed. 9. Please wear clothes that are easy to get on and off and wear slip-on shoes.  Thank you for allowing [place] to care for you!   --  [facility]    Follow-Up: At [facility], you and your health needs are our priority.  As part of our continuing mission to provide you with exceptional heart care, we have created designated Provider Care Teams.  These Care Teams include your primary Cardiologist (physician) and Advanced Practice Providers (APPs -  Physician Assistants and Nurse Practitioners) who all work together to provide you with the care you need, when you need it.  We recommend signing up for the patient portal called "MyChart".  Sign up information is provided on this After Visit Summary.  MyChart is used to connect with patients for Virtual Visits (Telemedicine).  Patients are able to view lab/test results, encounter notes, upcoming appointments, etc.  Non-urgent messages can be sent to your provider as well.   To learn more about what you can do with MyChart, go to [email or web address].    Your next appointment:   6 week(s)  The format for your next appointment:   In Person  Provider:   You may see Kate Sable, MD or one of the following Advanced Practice Providers on your designated Care Team:   Murray Hodgkins, NP Christell Faith, PA-C Cadence Kathlen Mody, [place]    Other Instructions

## 2020-11-28 LAB — BASIC METABOLIC PANEL
BUN/Creatinine Ratio: 17 (ref 10–24)
BUN: 18 mg/dL (ref 8–27)
CO2: 23 mmol/L (ref 20–29)
Calcium: 9.4 mg/dL (ref 8.6–10.2)
Chloride: 106 mmol/L (ref 96–106)
Creatinine, Ser: 1.08 mg/dL (ref 0.76–1.27)
Glucose: 120 mg/dL — ABNORMAL HIGH (ref 70–99)
Potassium: 5 mmol/L (ref 3.5–5.2)
Sodium: 140 mmol/L (ref 134–144)
eGFR: 73 mL/min/{1.73_m2} (ref >59)

## 2020-11-28 LAB — CBC
Hematocrit: 39.1 % (ref 37.5–51.0)
Hemoglobin: 12.6 g/dL — ABNORMAL LOW (ref 13.0–17.7)
MCH: 27.6 pg (ref 26.6–33.0)
MCHC: 32.2 g/dL (ref 31.5–35.7)
MCV: 86 fL (ref 79–97)
Platelets: 328 10*3/uL (ref 150–450)
RBC: 4.56 x10E6/uL (ref 4.14–5.80)
RDW: 13.3 % (ref 11.6–15.4)
WBC: 16.4 10*3/uL — ABNORMAL HIGH (ref 3.4–10.8)

## 2020-11-30 ENCOUNTER — Encounter: Payer: Self-pay | Admitting: Cardiology

## 2020-11-30 ENCOUNTER — Ambulatory Visit
Admission: RE | Admit: 2020-11-30 | Discharge: 2020-11-30 | Disposition: A | Discharge status: Home / Self Care | Payer: PPO | Attending: Cardiology | Admitting: Cardiology

## 2020-11-30 ENCOUNTER — Other Ambulatory Visit: Payer: Self-pay

## 2020-11-30 ENCOUNTER — Encounter
Admission: RE | Disposition: A | Discharge status: Home / Self Care | Payer: Self-pay | Source: Home / Self Care | Attending: Cardiology

## 2020-11-30 DIAGNOSIS — I25119 Atherosclerotic heart disease of native coronary artery with unspecified angina pectoris: Secondary | ICD-10-CM | POA: Diagnosis not present

## 2020-11-30 DIAGNOSIS — R0602 Shortness of breath: Secondary | ICD-10-CM | POA: Insufficient documentation

## 2020-11-30 DIAGNOSIS — I2510 Atherosclerotic heart disease of native coronary artery without angina pectoris: Secondary | ICD-10-CM

## 2020-11-30 DIAGNOSIS — I10 Essential (primary) hypertension: Secondary | ICD-10-CM | POA: Insufficient documentation

## 2020-11-30 DIAGNOSIS — Z79899 Other long term (current) drug therapy: Secondary | ICD-10-CM | POA: Insufficient documentation

## 2020-11-30 DIAGNOSIS — Z7982 Long term (current) use of aspirin: Secondary | ICD-10-CM | POA: Insufficient documentation

## 2020-11-30 DIAGNOSIS — I25118 Atherosclerotic heart disease of native coronary artery with other forms of angina pectoris: Secondary | ICD-10-CM | POA: Insufficient documentation

## 2020-11-30 DIAGNOSIS — Z955 Presence of coronary angioplasty implant and graft: Secondary | ICD-10-CM | POA: Insufficient documentation

## 2020-11-30 DIAGNOSIS — E785 Hyperlipidemia, unspecified: Secondary | ICD-10-CM | POA: Diagnosis not present

## 2020-11-30 DIAGNOSIS — R079 Chest pain, unspecified: Secondary | ICD-10-CM

## 2020-11-30 DIAGNOSIS — Z87891 Personal history of nicotine dependence: Secondary | ICD-10-CM | POA: Insufficient documentation

## 2020-11-30 HISTORY — PX: LEFT HEART CATH AND CORONARY ANGIOGRAPHY: CATH118249

## 2020-11-30 SURGERY — LEFT HEART CATH AND CORONARY ANGIOGRAPHY
Anesthesia: Moderate Sedation | Laterality: Left

## 2020-11-30 MED ORDER — ONDANSETRON HCL 4 MG/2ML IJ SOLN: 4.0000 mg | Freq: Four times a day (QID) | INTRAMUSCULAR | Status: DC | PRN

## 2020-11-30 MED ORDER — SODIUM CHLORIDE 0.9 % WEIGHT BASED INFUSION: 1.0000 mL/kg/h | INTRAVENOUS | Status: DC

## 2020-11-30 MED ORDER — HYDRALAZINE HCL 20 MG/ML IJ SOLN: 10.0000 mg | INTRAMUSCULAR | Status: DC | PRN

## 2020-11-30 MED ORDER — FENTANYL CITRATE (PF) 100 MCG/2ML IJ SOLN
INTRAMUSCULAR | Status: DC | PRN
  Administered 2020-11-30: 25 ug via INTRAVENOUS

## 2020-11-30 MED ORDER — VERAPAMIL HCL 2.5 MG/ML IV SOLN
INTRAVENOUS | Status: DC | PRN
  Administered 2020-11-30: 2.5 mg via INTRA_ARTERIAL

## 2020-11-30 MED ORDER — HEPARIN SODIUM (PORCINE) 1000 UNIT/ML IJ SOLN
INTRAMUSCULAR | Status: AC
  Filled 2020-11-30: qty 1

## 2020-11-30 MED ORDER — HEPARIN (PORCINE) IN NACL 1000-0.9 UT/500ML-% IV SOLN
INTRAVENOUS | Status: DC | PRN
  Administered 2020-11-30: 1000 mL

## 2020-11-30 MED ORDER — LIDOCAINE HCL 1 % IJ SOLN
INTRAMUSCULAR | Status: AC
  Filled 2020-11-30: qty 20

## 2020-11-30 MED ORDER — SODIUM CHLORIDE 0.9% FLUSH: 3.0000 mL | Freq: Two times a day (BID) | INTRAVENOUS | Status: DC

## 2020-11-30 MED ORDER — FENTANYL CITRATE (PF) 100 MCG/2ML IJ SOLN
INTRAMUSCULAR | Status: AC
  Filled 2020-11-30: qty 2

## 2020-11-30 MED ORDER — SODIUM CHLORIDE 0.9 % IV SOLN: 250.0000 mL | INTRAVENOUS | Status: DC | PRN

## 2020-11-30 MED ORDER — SODIUM CHLORIDE 0.9% FLUSH: 3.0000 mL | INTRAVENOUS | Status: DC | PRN

## 2020-11-30 MED ORDER — MIDAZOLAM HCL 2 MG/2ML IJ SOLN
INTRAMUSCULAR | Status: DC | PRN
  Administered 2020-11-30: 1 mg via INTRAVENOUS

## 2020-11-30 MED ORDER — HEPARIN SODIUM (PORCINE) 1000 UNIT/ML IJ SOLN
INTRAMUSCULAR | Status: DC | PRN
  Administered 2020-11-30: 4500 [IU] via INTRAVENOUS

## 2020-11-30 MED ORDER — ACETAMINOPHEN 325 MG PO TABS: 650.0000 mg | ORAL_TABLET | ORAL | Status: DC | PRN

## 2020-11-30 MED ORDER — IOHEXOL 350 MG/ML SOLN
INTRAVENOUS | Status: DC | PRN
  Administered 2020-11-30: 46 mL

## 2020-11-30 MED ORDER — MIDAZOLAM HCL 2 MG/2ML IJ SOLN
INTRAMUSCULAR | Status: AC
  Filled 2020-11-30: qty 2

## 2020-11-30 MED ORDER — LIDOCAINE HCL (PF) 1 % IJ SOLN
INTRAMUSCULAR | Status: DC | PRN
  Administered 2020-11-30: 2 mL

## 2020-11-30 MED ORDER — SODIUM CHLORIDE 0.9 % WEIGHT BASED INFUSION
3.0000 mL/kg/h | INTRAVENOUS | Status: AC
  Administered 2020-11-30: 3 mL/kg/h via INTRAVENOUS

## 2020-11-30 MED ORDER — SODIUM CHLORIDE 0.9 % IV SOLN: INTRAVENOUS | Status: DC

## 2020-11-30 MED ORDER — VERAPAMIL HCL 2.5 MG/ML IV SOLN
INTRAVENOUS | Status: AC
  Filled 2020-11-30: qty 2

## 2020-11-30 MED ORDER — HEPARIN (PORCINE) IN NACL 1000-0.9 UT/500ML-% IV SOLN
INTRAVENOUS | Status: AC
  Filled 2020-11-30: qty 1000

## 2020-11-30 MED ORDER — LABETALOL HCL 5 MG/ML IV SOLN: 10.0000 mg | INTRAVENOUS | Status: DC | PRN

## 2020-11-30 SURGICAL SUPPLY — 10 items
CATH 5F 110X4 TIG (CATHETERS) ×1 IMPLANT
DEVICE RAD TR BAND REGULAR (VASCULAR PRODUCTS) ×1 IMPLANT
DRAPE BRACHIAL (DRAPES) ×1 IMPLANT
GLIDESHEATH SLEND SS 6F .021 (SHEATH) ×1 IMPLANT
GUIDEWIRE INQWIRE 1.5J.035X260 (WIRE) IMPLANT
INQWIRE 1.5J .035X260CM (WIRE) ×2
PACK CARDIAC CATH (CUSTOM PROCEDURE TRAY) ×2 IMPLANT
PROTECTION STATION PRESSURIZED (MISCELLANEOUS) ×2
SET ATX SIMPLICITY (MISCELLANEOUS) ×1 IMPLANT
STATION PROTECTION PRESSURIZED (MISCELLANEOUS) IMPLANT

## 2020-11-30 NOTE — Interval H&P Note (Signed)
History and Physical Interval Note:  11/30/2020 11:31 AM  [name]  has presented today for surgery, with the diagnosis of LT Cath    Chest pain -progressive angina the various methods of treatment have been discussed with the patient and family. After consideration of risks, benefits and other options for treatment, the patient has consented to  Procedure(s): LEFT HEART CATH AND CORONARY ANGIOGRAPHY (Left)  PERCUTANEOUS CORONARY INTERVENTION  as a surgical intervention.  The patient's history has been reviewed, patient examined, no change in status, stable for surgery.  I have reviewed the patient's chart and labs.  Questions were answered to the patient's satisfaction.    Cath Lab Visit (complete for each Cath Lab visit)  Clinical Evaluation Leading to the Procedure:   ACS: No.  Non-ACS:    Anginal Classification: CCS III  Anti-ischemic medical therapy: Minimal Therapy (1 class of medications)  Non-Invasive Test Results: No non-invasive testing performed  Prior CABG: No previous CABG    Glenetta Hew

## 2020-12-07 ENCOUNTER — Encounter: Payer: Self-pay | Admitting: Internal Medicine

## 2020-12-07 ENCOUNTER — Other Ambulatory Visit: Payer: Self-pay

## 2020-12-07 ENCOUNTER — Ambulatory Visit: Payer: PPO | Admitting: Internal Medicine

## 2020-12-07 VITALS — BP 130/62 | HR 66 | Temp 97.2°F | Ht 72.0 in | Wt 216.2 lb

## 2020-12-07 DIAGNOSIS — R0602 Shortness of breath: Secondary | ICD-10-CM

## 2020-12-07 DIAGNOSIS — G4719 Other hypersomnia: Secondary | ICD-10-CM

## 2020-12-07 DIAGNOSIS — Z87891 Personal history of nicotine dependence: Secondary | ICD-10-CM

## 2020-12-07 MED ORDER — SPIRIVA RESPIMAT 2.5 MCG/ACT IN AERS: 2.0000 | INHALATION_SPRAY | Freq: Every day | RESPIRATORY_TRACT | 0 refills | Status: DC

## 2020-12-07 MED ORDER — SPIRIVA RESPIMAT 2.5 MCG/ACT IN AERS
2.0000 | INHALATION_SPRAY | Freq: Every day | RESPIRATORY_TRACT | 5 refills | Status: DC
Start: 2020-12-07 — End: 2021-04-09

## 2020-12-07 NOTE — Patient Instructions (Addendum)
Obtain Pulmonary Function Testing  Lung Cancer Screening referral Program  START SPIRIVA RESPIMAT  2.5  Obtain Home SLeep Test
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
## 2020-12-11 ENCOUNTER — Other Ambulatory Visit: Payer: Self-pay

## 2020-12-11 ENCOUNTER — Encounter: Payer: Self-pay | Admitting: Cardiology

## 2020-12-11 ENCOUNTER — Ambulatory Visit: Payer: PPO | Admitting: Cardiology

## 2020-12-11 VITALS — BP 120/60 | HR 80 | Ht 72.0 in | Wt 213.0 lb

## 2020-12-11 DIAGNOSIS — E785 Hyperlipidemia, unspecified: Secondary | ICD-10-CM

## 2020-12-11 DIAGNOSIS — R0602 Shortness of breath: Secondary | ICD-10-CM | POA: Diagnosis not present

## 2020-12-11 DIAGNOSIS — I10 Essential (primary) hypertension: Secondary | ICD-10-CM | POA: Diagnosis not present

## 2020-12-11 DIAGNOSIS — I2510 Atherosclerotic heart disease of native coronary artery without angina pectoris: Secondary | ICD-10-CM

## 2020-12-11 MED ORDER — ISOSORBIDE MONONITRATE ER 30 MG PO TB24: 15.0000 mg | ORAL_TABLET | Freq: Every day | ORAL | 2 refills | Status: DC

## 2020-12-11 NOTE — Patient Instructions (Addendum)
Medication Instructions:   Your physician has recommended you make the following change in your medication:   DECREASE Isosorbide Mononitrate (Imdur) 15 mg daily - Take HALF tablet of Imdur 30 mg daily for total of 15 mg  *If you need a refill on your cardiac medications before your next appointment, please call your pharmacy*   Lab Work:  Your physician recommends that you return for FASTING lab work in: 6 MONTHS (Lipid panel)   Testing/Procedures:  None ordered   Follow-Up: At [facility], you and your health needs are our priority.  As part of our continuing mission to provide you with exceptional heart care, we have created designated Provider Care Teams.  These Care Teams include your primary Cardiologist (physician) and Advanced Practice Providers (APPs -  Physician Assistants and Nurse Practitioners) who all work together to provide you with the care you need, when you need it.  We recommend signing up for the patient portal called "MyChart".  Sign up information is provided on this After Visit Summary.  MyChart is used to connect with patients for Virtual Visits (Telemedicine).  Patients are able to view lab/test results, encounter notes, upcoming appointments, etc.  Non-urgent messages can be sent to your provider as well.   To learn more about what you can do with MyChart, go to [email or web address].    Your next appointment:   6 month(s) after lipid panel  The format for your next appointment:   In Person  Provider:   You may see Kate Sable, MD or one of the following Advanced Practice Providers on your designated Care Team:   Murray Hodgkins, NP Christell Faith, PA-C Cadence Kathlen Mody, [place]
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
## 2020-12-15 ENCOUNTER — Telehealth: Payer: Self-pay

## 2020-12-15 NOTE — Telephone Encounter (Signed)
Called and LVM patient about upcoming covid test on 11/29.

## 2020-12-15 NOTE — Telephone Encounter (Signed)
Noted.  Will close encounter.  

## 2020-12-19 ENCOUNTER — Other Ambulatory Visit: Payer: Self-pay

## 2020-12-19 ENCOUNTER — Other Ambulatory Visit
Admission: RE | Admit: 2020-12-19 | Discharge: 2020-12-19 | Disposition: A | Discharge status: Home / Self Care | Payer: PPO | Source: Ambulatory Visit | Attending: Internal Medicine | Admitting: Internal Medicine

## 2020-12-19 DIAGNOSIS — Z01812 Encounter for preprocedural laboratory examination: Secondary | ICD-10-CM | POA: Diagnosis not present

## 2020-12-19 DIAGNOSIS — Z20822 Contact with and (suspected) exposure to covid-19: Secondary | ICD-10-CM | POA: Diagnosis not present

## 2020-12-20 ENCOUNTER — Ambulatory Visit: Payer: PPO | Attending: Internal Medicine

## 2020-12-20 DIAGNOSIS — Z87891 Personal history of nicotine dependence: Secondary | ICD-10-CM | POA: Insufficient documentation

## 2020-12-20 DIAGNOSIS — R0602 Shortness of breath: Secondary | ICD-10-CM | POA: Diagnosis not present

## 2020-12-20 LAB — PULMONARY FUNCTION TEST ARMC ONLY
DL/VA % pred: 86 %
DL/VA: 3.44 ml/min/mmHg/L
DLCO unc % pred: 79 %
DLCO unc: 21.5 ml/min/mmHg
FEF 25-75 Post: 1.7 L/sec
FEF 25-75 Pre: 2.05 L/sec
FEF2575-%Change-Post: -17 %
FEF2575-%Pred-Post: 66 %
FEF2575-%Pred-Pre: 79 %
FEV1-%Change-Post: 0 %
FEV1-%Pred-Post: 82 %
FEV1-%Pred-Pre: 82 %
FEV1-Post: 2.83 L
FEV1-Pre: 2.85 L
FEV1FVC-%Change-Post: 2 %
FEV1FVC-%Pred-Pre: 89 %
FEV6-%Change-Post: -7 %
FEV6-%Pred-Post: 90 %
FEV6-%Pred-Pre: 97 %
FEV6-Post: 3.99 L
FEV6-Pre: 4.33 L
FEV6FVC-%Change-Post: 0 %
FEV6FVC-%Pred-Post: 105 %
FEV6FVC-%Pred-Pre: 105 %
FVC-%Change-Post: -3 %
FVC-%Pred-Post: 89 %
FVC-%Pred-Pre: 93 %
FVC-Post: 4.22 L
FVC-Pre: 4.37 L
Post FEV1/FVC ratio: 67 %
Post FEV6/FVC ratio: 100 %
Pre FEV1/FVC ratio: 65 %
Pre FEV6/FVC Ratio: 99 %
RV % pred: 87 %
RV: 2.29 L
TLC % pred: 89 %
TLC: 6.62 L

## 2020-12-20 LAB — SARS CORONAVIRUS 2 (TAT 6-24 HRS): SARS Coronavirus 2: NEGATIVE

## 2020-12-20 MED ORDER — ALBUTEROL SULFATE (2.5 MG/3ML) 0.083% IN NEBU
2.5000 mg | INHALATION_SOLUTION | Freq: Once | RESPIRATORY_TRACT | Status: AC
  Administered 2020-12-20: 2.5 mg via RESPIRATORY_TRACT
  Filled 2020-12-20: qty 3

## 2020-12-23 ENCOUNTER — Other Ambulatory Visit: Payer: Self-pay | Admitting: Cardiology

## 2020-12-25 NOTE — Progress Notes (Signed)
Chronic Care Management Pharmacy Note  12/29/2020 Name:  [name] MRN:  [id number] DOB:  [birth year]  Subjective: [name] is an 70 y.o. year old male who is a primary patient of Pickard, Cammie Mcgee, MD.  The CCM team was consulted for assistance with disease management and care coordination needs.    Engaged with patient by telephone for follow up visit in response to provider referral for pharmacy case management and/or care coordination services.   Consent to Services:  The patient was given the following information about Chronic Care Management services today, agreed to services, and gave verbal consent: 1. CCM service includes personalized support from designated clinical staff supervised by the primary care provider, including individualized plan of care and coordination with other care providers 2. 24/7 contact phone numbers for assistance for urgent and routine care needs. 3. Service will only be billed when office clinical staff spend 20 minutes or more in a month to coordinate care. 4. Only one practitioner may furnish and bill the service in a calendar month. 5.The patient may stop CCM services at any time (effective at the end of the month) by phone call to the office staff. 6. The patient will be responsible for cost sharing (co-pay) of up to 20% of the service fee (after annual deductible is met). Patient agreed to services and consent obtained.  Patient Care Team: Susy Frizzle, MD as PCP - General (Family Medicine) Kate Sable, MD as PCP - Cardiology (Cardiology) Edythe Clarity, [facility] as Pharmacist (Pharmacist)  Recent office visits: 05/12/20 Edsel Petrin) - Losartan reduced to 27m daily.  Also advised patient to reduce his pain medication in half.  Recent consult visits: 12/11/20 (Agbor-Etang, cardiology) - FU, Imdur decreased to 155mdaily due to headaches  Hospital visits: None in previous 6 months  Objective:  Lab Results  Component Value Date    CREATININE 1.08 11/27/2020   BUN 18 11/27/2020   GFRNONAA 53 (L) 04/03/2020   GFRAA 62 04/03/2020   NA 140 11/27/2020   K 5.0 11/27/2020   CALCIUM 9.4 11/27/2020   CO2 23 11/27/2020   GLUCOSE 120 (H) 11/27/2020    No results found for: HGBA1C, FRUCTOSAMINE, GFR, MICROALBUR  Last diabetic Eye exam: No results found for: HMDIABEYEEXA  Last diabetic Foot exam: No results found for: HMDIABFOOTEX   Lab Results  Component Value Date   CHOL 151 09/12/2020   HDL 35 (L) 09/12/2020   LDLCALC 86 09/12/2020   TRIG 208 (H) 09/12/2020   CHOLHDL 4.3 09/12/2020    Hepatic Function Latest Ref Rng & Units 09/12/2020 04/03/2020 03/27/2020  Total Protein 6.1 - 8.1 g/dL 6.4 6.6 6.2  Albumin 3.6 - 5.1 g/dL - - -  AST 10 - 35 U/L _0 ALT 9 - 46 U/L _1 Alk Phosphatase 40 - 115 U/L - - -  Total Bilirubin 0.2 - 1.2 mg/dL 0.7 0.7 0.5    Lab Results  Component Value Date/Time   TSH 3.38 10/06/2015 02:29 PM   TSH 3.574 01/03/2015 04:14 PM    CBC Latest Ref Rng & Units 11/27/2020 10/18/2020 09/28/2020  WBC 3.4 - 10.8 x10E3/uL 16.4(H) 10.2 8.6  Hemoglobin 13.0 - 17.7 g/dL 12.6(L) 14.0 14.8  Hematocrit 37.5 - 51.0 % 39.1 41.8 44.2  Platelets 150 - 450 x10E3/uL 328 309 312    No results found for: VD25OH  Clinical ASCVD: No  The 10-year ASCVD risk score (Arnett DK, et al., 2019) is:  21.8%   Values used to calculate the score:     Age: 37 years     Sex: Male     Is Non-Hispanic African American: No     Diabetic: No     Tobacco smoker: No     Systolic Blood Pressure: 193 mmHg     Is BP treated: Yes     HDL Cholesterol: 35 mg/dL     Total Cholesterol: 151 mg/dL    Depression screen [facility] 2/9 11/09/2020 11/16/2019 11/27/2017  Decreased Interest 0 0 0  Down, Depressed, Hopeless 0 0 0  PHQ - 2 Score 0 0 0  Altered sleeping - - -  Tired, decreased energy - - -  Change in appetite - - -  Feeling bad or failure about yourself  - - -  Trouble concentrating - - -  Moving slowly or  fidgety/restless - - -  Suicidal thoughts - - -  PHQ-9 Score - - -  Difficult doing work/chores - - -      Social History   Tobacco Use  Smoking Status Former   Packs/day: 1.00   Years: 30.00   Pack years: 30.00   Types: Cigarettes, Cigars   Quit date: 05/13/1998   Years since quitting: 22.6  Smokeless Tobacco Current   Types: Chew   BP Readings from Last 3 Encounters:  12/11/20 120/60  12/07/20 130/62  11/30/20 105/64   Pulse Readings from Last 3 Encounters:  12/11/20 80  12/07/20 66  11/30/20 76   Wt Readings from Last 3 Encounters:  12/11/20 213 lb (96.6 kg)  12/07/20 216 lb 3.2 oz (98.1 kg)  11/30/20 205 lb (93 kg)   BMI Readings from Last 3 Encounters:  12/11/20 28.89 kg/m  12/07/20 29.32 kg/m  11/30/20 27.80 kg/m    Assessment/Interventions: Review of patient past medical history, allergies, medications, health status, including review of consultants reports, laboratory and other test data, was performed as part of comprehensive evaluation and provision of chronic care management services.   SDOH:  (Social Determinants of Health) assessments and interventions performed: Yes   Financial Resource Strain: Low Risk    Difficulty of Paying Living Expenses: Not hard at all    SDOH Screenings   Alcohol Screen: Low Risk    Last Alcohol Screening Score (AUDIT): 1  Depression (PHQ2-9): Low Risk    PHQ-2 Score: 0  Financial Resource Strain: Low Risk    Difficulty of Paying Living Expenses: Not hard at all  Food Insecurity: No Food Insecurity   Worried About Charity fundraiser in the Last Year: Never true   Ran Out of Food in the Last Year: Never true  Housing: Low Risk    Last Housing Risk Score: 0  Physical Activity: Sufficiently Active   Days of Exercise per Week: 5 days   Minutes of Exercise per Session: 30 min  Social Connections: Socially Integrated   Frequency of Communication with Friends and Family: Three times a week   Frequency of Social  Gatherings with Friends and Family: Three times a week   Attends Religious Services: More than 4 times per year   Active Member of Clubs or Organizations: Yes   Attends Music therapist: More than 4 times per year   Marital Status: Married  Stress: No Stress Concern Present   Feeling of Stress : Not at all  Tobacco Use: High Risk   Smoking Tobacco Use: Former   Smokeless Tobacco Use: Current   Passive Exposure:  Not on file  Transportation Needs: No Transportation Needs   Lack of Transportation (Medical): No   Lack of Transportation (Non-Medical): No    CCM Care Plan  Allergies  Allergen Reactions   Levaquin [Levofloxacin In D5w] Shortness Of Breath   Crestor [Rosuvastatin Calcium] Diarrhea    Medications Reviewed Today     Reviewed by Edythe Clarity, [facility] (Pharmacist) on 12/29/20 at 1004  Med List Status: <None>   Medication Order Taking? Sig Documenting Provider Last Dose Status Informant  aspirin EC 81 MG tablet [id number] Yes Take 81 mg by mouth daily. Swallow whole. [provider] Taking Active Self  cetirizine (ZYRTEC) 10 MG tablet [id number] Yes TAKE 1 TABLET BY MOUTH EVERY DAY Susy Frizzle, MD Taking Active   ezetimibe (ZETIA) 10 MG tablet [id number] Yes Take 10 mg by mouth at bedtime. [provider] Taking Active   HYDROcodone bit-homatropine (HYCODAN) 5-1.5 MG/5ML syrup [id number] Yes Take 5 mLs by mouth every 8 (eight) hours as needed for cough. Susy Frizzle, MD Taking Active   isosorbide mononitrate (IMDUR) 30 MG 24 hr tablet [id number] No Take 0.5 tablets (15 mg total) by mouth daily.  Patient not taking: Reported on 12/28/2020   Kate Sable, MD Not Taking Active   losartan (COZAAR) 50 MG tablet [id number] Yes Take 50 mg by mouth in the morning. [provider] Taking Active Self  nitroGLYCERIN (NITROSTAT) 0.4 MG SL tablet [id number] Yes Place 1 tablet (0.4 mg total) under the tongue every 5 (five) minutes as  needed for chest pain. Susy Frizzle, MD Taking Active Self  pantoprazole (PROTONIX) 40 MG tablet [id number] Yes Take 1 tablet (40 mg total) by mouth daily. Kate Sable, MD Taking Active   rosuvastatin (CRESTOR) 40 MG tablet [id number] Yes TAKE 1 TABLET BY MOUTH EVERY DAY Agbor-Etang, Aaron Edelman, MD Taking Active   ticagrelor (BRILINTA) 90 MG TABS tablet [id number] Yes Take 1 tablet (90 mg total) by mouth 2 (two) times daily. Cheryln Manly, NP Taking Active   Tiotropium Bromide Monohydrate (SPIRIVA RESPIMAT) 2.5 MCG/ACT AERS [id number] Yes Inhale 2 puffs into the lungs daily. Flora Lipps, MD Taking Active   Tiotropium Bromide Monohydrate (SPIRIVA RESPIMAT) 2.5 MCG/ACT AERS [id number] Yes Inhale 2 puffs into the lungs daily. Flora Lipps, MD Taking Active   tiZANidine (ZANAFLEX) 4 MG tablet [id number] Yes Take 1 tablet (4 mg total) by mouth every 6 (six) hours as needed for muscle spasms. Susy Frizzle, MD Taking Active             Patient Active Problem List   Diagnosis Date Noted   Angina, class III ([place]) 10/04/2020   Abnormal cardiac CT angiography 10/04/2020   Coronary artery disease involving native coronary artery of native heart with angina pectoris ([place])    Left hamstring injury 09/13/2019   Low back pain radiating to right leg 06/15/2019   Posterior tibial tendinitis, left 12/04/2018   Bilateral leg pain 05/21/2017   Degenerative arthritis of knee, bilateral 02/18/2017   Bilateral knee pain 07/18/2016   Right foot pain 04/16/2016   LLQ abdominal pain 06/10/2013   Melanoma in situ ([place])    Shortness of breath 05/25/2012   Allergic rhinitis 05/25/2012   Diverticulosis    Former smoker    HLD (hyperlipidemia)    Allergy    ABDOMINAL PAIN, LEFT LOWER QUADRANT 10/23/2009   GERD 03/31/2007   DIVERTICULITIS, ACUTE 03/31/2007   SINUSITIS- ACUTE-NOS 02/18/2007   URI 02/18/2007   ERECTILE DYSFUNCTION 10/29/2006  TOBACCO ABUSE 10/29/2006   Hyperlipidemia with target  LDL less than 70 10/01/2006   Essential hypertension 10/01/2006   DIVERTICULOSIS, COLON 10/01/2006    Immunization History  Administered Date(s) Administered   Influenza,inj,Quad PF,6+ Mos 12/16/2012, 11/03/2014   Influenza-Unspecified 10/21/2016, 10/11/2020   Pneumococcal Conjugate-13 07/01/2014   Pneumococcal Polysaccharide-23 06/19/2012, 11/30/2019   Td 06/12/2001   Tdap 05/18/2013    Conditions to be addressed/monitored:  Hypertension and Hyperlipidemia  Care Plan : General Pharmacy (Adult)  Updates made by Edythe Clarity, RPH since 12/29/2020 12:00 AM     Problem: HTN, HLD   Priority: High  Onset Date: 06/08/2020     Long-Range Goal: Patient-Specific Goal   Start Date: 06/07/2020  Expected End Date: 12/09/2020  Recent Progress: On track  Priority: High  Note:   Current Barriers:  Unable to achieve control of cholesterol   Pharmacist Clinical Goal(s):  Patient will achieve adherence to monitoring guidelines and medication adherence to achieve therapeutic efficacy achieve control of cholesterol as evidenced by lipid panel adhere to prescribed medication regimen as evidenced by fill dates contact provider office for questions/concerns as evidenced notation of same in electronic health record through collaboration with PharmD and provider.   Interventions: 1:1 collaboration with Susy Frizzle, MD regarding development and update of comprehensive plan of care as evidenced by provider attestation and co-signature Inter-disciplinary care team collaboration (see longitudinal plan of care) Comprehensive medication review performed; medication list updated in electronic medical record  Hypertension (BP goal <140/90) -Controlled -Current treatment: Losartan 47m -Medications previously tried: Metoprolol XL, Imdur (headaches) -Current home readings: 140-150/70-80 -Denies hypotensive/hypertensive symptoms -Educated on BP goals and benefits of medications for  prevention of heart attack, stroke and kidney damage; Exercise goal of 150 minutes per week; Importance of home blood pressure monitoring; Symptoms of hypotension and importance of maintaining adequate hydration; -Counseled to monitor BP at home daily, document, and provide log at future appointments -Reports he has bouts of dizziness after surgery when he was on some of the medication for post op recovery.  This made him have lower BP so he decreased back to 521mof losartan.  Currently BP is right around 140/80s.  He continues to monitor daily.   -Recommended to continue current medication Recommended he continue to monitor and contact providers if BP is consistently > 140/90.  At that time would recommend he increase back to previous dose of 10064m Will continue to follow.  BP monitoring plan initiated/continued.  Update 12/28/20 120-130/80s.  Not having anymore dizziness. He has stopped the Imdur completely due to severe headaches which was OK's by cardiology Reports feeling well overall, encouraged physical activity, etc. Continue current meds for now - continue to monitor BP at home. Report consistent readings > 130/80 to providers.  Hyperlipidemia/CAD: (LDL goal < 70) -Not ideally controlled -Current treatment: Zetia 77m65mily Brilinta 90mg59m -Medications previously tried: Atorvastatin (aches)  -Educated on Cholesterol goals;  Importance of limiting foods high in cholesterol; Exercise goal of 150 minutes per week;  -Patient has completely d/c statin at this time.  He could not tolerate the myalgias.  Has been on Zetia for about 2 months and reports no issues so far.  -Recommended to continue current medication Recommended he continue lifestyle changes including dietary fats and cholesterol.  Encouraged continued adherence to medication.  Recommend follow up lipid panel to assess efficacy.  Update 12/29/20 LDL still above goal < 70 with CAD He cannot tolerate statins,  adherent with Zetia currently Recommend  continue to monitor. ASCVD risk is HIGH. Could possibly consider Repatha pending cost and patient willingness. Will address at next visit. No changes at this time.   Patient Goals/Self-Care Activities Patient will:  - take medications as prescribed focus on medication adherence by pill counts check blood pressure daily, document, and provide at future appointments target a minimum of 150 minutes of moderate intensity exercise weekly  Follow Up Plan: The care management team will reach out to the patient again over the next 120 days.             Medication Assistance: None required.  Patient affirms current coverage meets needs.  Patient's preferred pharmacy is:  [id number] [place], [place] [address] [place] [id number]: [phone]: [phone] [id number] [place] [place]: [phone]: [phone] [address]: [phone]: [phone] [address]: [phone]: [phone] pill box? No - just vials of meds Pt endorses 100% compliance  We discussed: Benefits of medication synchronization, packaging and delivery as well as enhanced pharmacist oversight with [employer]. Patient decided to: Continue current medication management strategy  Care Plan and Follow Up Patient Decision:  Patient agrees to Care Plan and Follow-up.  Plan: The care management team will reach out to the patient again over the next 120 days.  ChBeverly MilchPharmD Clinical Pharmacist [phone]

## 2020-12-25 NOTE — Telephone Encounter (Signed)
This is a Edison pt 

## 2020-12-27 ENCOUNTER — Telehealth: Payer: Self-pay | Admitting: Internal Medicine

## 2020-12-27 NOTE — Telephone Encounter (Signed)
Lm for patient.  

## 2020-12-28 ENCOUNTER — Ambulatory Visit: Payer: PPO | Admitting: Pharmacist

## 2020-12-28 DIAGNOSIS — E7800 Pure hypercholesterolemia, unspecified: Secondary | ICD-10-CM

## 2020-12-28 DIAGNOSIS — I10 Essential (primary) hypertension: Secondary | ICD-10-CM

## 2020-12-28 NOTE — Telephone Encounter (Signed)
Interpretation Summary  Spirometry Data Is Acceptable and Reproducible   Mild Obstructive Airways Disease without  Significant Broncho-Dilator Response   Consider outpatient Pulmonary Consultation if needed   Clinical Correlation Advised    Patient stated that he reviewed above results via mychart. He would like to know next steps.   Dr. Mortimer Fries, please advise. Thanks

## 2020-12-28 NOTE — Telephone Encounter (Signed)
Appt scheduled for 02/15/21 at 4:00 with Rexene Edison, NP.  Patient is aware and voiced his understanding.  Nothing further needed.

## 2020-12-29 ENCOUNTER — Other Ambulatory Visit: Payer: Self-pay

## 2020-12-29 MED ORDER — TIZANIDINE HCL 4 MG PO TABS
4.0000 mg | ORAL_TABLET | Freq: Four times a day (QID) | ORAL | 0 refills | Status: DC | PRN
Start: 2020-12-29 — End: 2021-01-29

## 2020-12-29 NOTE — Patient Instructions (Addendum)
Visit Information   Goals Addressed             This Visit's Progress    Track and Manage My Blood Pressure-Hypertension   On track    Timeframe:  Long-Range Goal Priority:  High Start Date: 06/08/2020                             Expected End Date: 12/09/20                       Follow Up Date 08/20/20    - check blood pressure daily - choose a place to take my blood pressure (home, clinic or office, retail store) - write blood pressure results in a log or diary    Why is this important?   You won't feel high blood pressure, but it can still hurt your blood vessels.  High blood pressure can cause heart or kidney problems. It can also cause a stroke.  Making lifestyle changes like losing a little weight or eating less salt will help.  Checking your blood pressure at home and at different times of the day can help to control blood pressure.  If the doctor prescribes medicine remember to take it the way the doctor ordered.  Call the office if you cannot afford the medicine or if there are questions about it.     Notes: Goal < 140/90       Patient Care Plan: General Pharmacy (Adult)     Problem Identified: HTN, HLD   Priority: High  Onset Date: 06/08/2020     Long-Range Goal: Patient-Specific Goal   Start Date: 06/07/2020  Expected End Date: 12/09/2020  Recent Progress: On track  Priority: High  Note:   Current Barriers:  Unable to achieve control of cholesterol   Pharmacist Clinical Goal(s):  Patient will achieve adherence to monitoring guidelines and medication adherence to achieve therapeutic efficacy achieve control of cholesterol as evidenced by lipid panel adhere to prescribed medication regimen as evidenced by fill dates contact provider office for questions/concerns as evidenced notation of same in electronic health record through collaboration with PharmD and provider.   Interventions: 1:1 collaboration with Michael Frizzle, MD regarding development and  update of comprehensive plan of care as evidenced by provider attestation and co-signature Inter-disciplinary care team collaboration (see longitudinal plan of care) Comprehensive medication review performed; medication list updated in electronic medical record  Hypertension (BP goal <140/90) -Controlled -Current treatment: Losartan 50mg  -Medications previously tried: Metoprolol XL, Imdur (headaches) -Current home readings: 140-150/70-80 -Denies hypotensive/hypertensive symptoms -Educated on BP goals and benefits of medications for prevention of heart attack, stroke and kidney damage; Exercise goal of 150 minutes per week; Importance of home blood pressure monitoring; Symptoms of hypotension and importance of maintaining adequate hydration; -Counseled to monitor BP at home daily, document, and provide log at future appointments -Reports he has bouts of dizziness after surgery when he was on some of the medication for post op recovery.  This made him have lower BP so he decreased back to 50mg  of losartan.  Currently BP is right around 140/80s.  He continues to monitor daily.   -Recommended to continue current medication Recommended he continue to monitor and contact providers if BP is consistently > 140/90.  At that time would recommend he increase back to previous dose of 100mg .  Will continue to follow.  BP monitoring plan initiated/continued.  Update 12/28/20 120-130/80s.  Not having anymore dizziness. He has stopped the Imdur completely due to severe headaches which was OK's by cardiology Reports feeling well overall, encouraged physical activity, etc. Continue current meds for now - continue to monitor BP at home. Report consistent readings > 130/80 to providers.  Hyperlipidemia/CAD: (LDL goal < 70) -Not ideally controlled -Current treatment: Zetia 10mg  daily Brilinta 90mg  BID -Medications previously tried: Atorvastatin (aches)  -Educated on Cholesterol goals;  Importance of  limiting foods high in cholesterol; Exercise goal of 150 minutes per week;  -Patient has completely d/c statin at this time.  He could not tolerate the myalgias.  Has been on Zetia for about 2 months and reports no issues so far.  -Recommended to continue current medication Recommended he continue lifestyle changes including dietary fats and cholesterol.  Encouraged continued adherence to medication.  Recommend follow up lipid panel to assess efficacy.  Update 12/29/20 LDL still above goal < 70 with CAD He cannot tolerate statins, adherent with Zetia currently Recommend continue to monitor. ASCVD risk is HIGH. Could possibly consider Repatha pending cost and patient willingness. Will address at next visit. No changes at this time.   Patient Goals/Self-Care Activities Patient will:  - take medications as prescribed focus on medication adherence by pill counts check blood pressure daily, document, and provide at future appointments target a minimum of 150 minutes of moderate intensity exercise weekly  Follow Up Plan: The care management team will reach out to the patient again over the next 120 days.            Patient verbalizes understanding of instructions provided today and agrees to view in [place].  Telephone follow up appointment with pharmacy team member scheduled for: 6 months  [name], [place], PharmD, [place] Clinical Pharmacist Practitioner [place] [phone]

## 2021-01-08 ENCOUNTER — Ambulatory Visit: Payer: PPO | Admitting: Cardiology

## 2021-01-26 DIAGNOSIS — M3501 Sicca syndrome with keratoconjunctivitis: Secondary | ICD-10-CM | POA: Diagnosis not present

## 2021-01-29 ENCOUNTER — Other Ambulatory Visit: Payer: Self-pay

## 2021-01-29 ENCOUNTER — Ambulatory Visit: Payer: PPO | Admitting: Adult Health

## 2021-01-29 ENCOUNTER — Encounter: Payer: Self-pay | Admitting: Adult Health

## 2021-01-29 DIAGNOSIS — J449 Chronic obstructive pulmonary disease, unspecified: Secondary | ICD-10-CM

## 2021-01-29 DIAGNOSIS — R400 Somnolence: Secondary | ICD-10-CM

## 2021-01-29 NOTE — Telephone Encounter (Signed)
Pt asking for refill on Tizanidine  LOV 11/03/20 Last refill 12/29/20, #30, 0 refills  Please review, thanks!

## 2021-01-29 NOTE — Patient Instructions (Signed)
Albuterol inhaler 1-2 puffs every 6hr as needed.  Home sleep as planned next week.  Healthy sleep regimen  Activity as tolerated.  Follow up with Dr. Mortimer Fries in 3 months and As needed

## 2021-01-29 NOTE — Progress Notes (Signed)
@Patient  ID: [name], male    DOB: 1950/05/30, 71 y.o.   MRN: [id number]  Chief Complaint  Patient presents with   Follow-up    Referring provider: Susy Frizzle, MD  HPI: 71 year old male former smoker seen for pulmonary consult December 07, 2020 for shortness of breath found to have mild COPD  TEST/EVENTS :  PFTs December 20, 2020 showed FEV1 at 82%, ratio 67, FVC 89%, no significant bronchodilator response, DLCO 79%.  Coronary CT 09/21/2020 showed bibasilar atelectasis with no acute process noted  01/29/21: Follow up : COPD  Patient returns for a 61-month follow-up.  Patient was seen last visit for pulmonary consult for shortness of breath and cough.  Patient was felt to have some underlying COPD with his smoking history.  Patient quit smoking greater than 20 years ago does not qualify for the low-dose CT screening program.  Patient was started on Spiriva but says that this did not help at all and made his cough worse.  Patient since last visit he is feeling better.  His cough has pretty much almost resolved.  He does get short of breath with activities but does not consider it can severe.  Patient remains very active.  He works full-time for the city of [employer].  He is a Engineer, maintenance.  He is very active at home works in his shop, yard and works on cars and tractors.  Patient has tried multiple inhalers in the past with no perceived benefit. He denies any chest pain orthopnea PND hemoptysis or unintentional weight loss.  Patient did have daytime sleepiness and symptoms suspicious for underlying sleep and.  Home sleep study is pending.  Allergies  Allergen Reactions   Levaquin [Levofloxacin In D5w] Shortness Of Breath   Crestor [Rosuvastatin Calcium] Diarrhea    Immunization History  Administered Date(s) Administered   Influenza,inj,Quad PF,6+ Mos 12/16/2012, 11/03/2014   Influenza-Unspecified 10/21/2016, 10/11/2020   Pneumococcal Conjugate-13 07/01/2014    Pneumococcal Polysaccharide-23 06/19/2012, 11/30/2019   Td 06/12/2001   Tdap 05/18/2013    Past Medical History:  Diagnosis Date   Adenomatous colon polyp 2004   Allergy    Diverticulosis 2011   Former smoker    HLD (hyperlipidemia)    Hyperplastic colon polyp 2004   Hypertension    Melanoma in situ ([place])     Tobacco History: Social History   Tobacco Use  Smoking Status Former   Packs/day: 1.00   Years: 30.00   Pack years: 30.00   Types: Cigarettes, Cigars   Quit date: 05/13/1998   Years since quitting: 22.7  Smokeless Tobacco Current   Types: Chew   Ready to quit: Not Answered Counseling given: Not Answered   Outpatient Medications Prior to Visit  Medication Sig Dispense Refill   aspirin EC 81 MG tablet Take 81 mg by mouth daily. Swallow whole.     cetirizine (ZYRTEC) 10 MG tablet TAKE 1 TABLET BY MOUTH EVERY DAY 90 tablet 3   ezetimibe (ZETIA) 10 MG tablet Take 10 mg by mouth at bedtime.     HYDROcodone bit-homatropine (HYCODAN) 5-1.5 MG/5ML syrup Take 5 mLs by mouth every 8 (eight) hours as needed for cough. 120 mL 0   isosorbide mononitrate (IMDUR) 30 MG 24 hr tablet Take 0.5 tablets (15 mg total) by mouth daily. 45 tablet 2   losartan (COZAAR) 50 MG tablet Take 50 mg by mouth in the morning.     nitroGLYCERIN (NITROSTAT) 0.4 MG SL tablet Place 1 tablet (0.4 mg total)  under the tongue every 5 (five) minutes as needed for chest pain. 50 tablet 3   pantoprazole (PROTONIX) 40 MG tablet Take 1 tablet (40 mg total) by mouth daily. 30 tablet 5   rosuvastatin (CRESTOR) 40 MG tablet TAKE 1 TABLET BY MOUTH EVERY DAY 30 tablet 4   ticagrelor (BRILINTA) 90 MG TABS tablet Take 1 tablet (90 mg total) by mouth 2 (two) times daily. 180 tablet 2   Tiotropium Bromide Monohydrate (SPIRIVA RESPIMAT) 2.5 MCG/ACT AERS Inhale 2 puffs into the lungs daily. 2 each 5   Tiotropium Bromide Monohydrate (SPIRIVA RESPIMAT) 2.5 MCG/ACT AERS Inhale 2 puffs into the lungs daily. 4 g 0   tiZANidine  (ZANAFLEX) 4 MG tablet Take 1 tablet (4 mg total) by mouth every 6 (six) hours as needed for muscle spasms. 30 tablet 0   No facility-administered medications prior to visit.     Review of Systems:   Constitutional:   No  weight loss, night sweats,  Fevers, chills,  +fatigue, or  lassitude.  HEENT:   No headaches,  Difficulty swallowing,  Tooth/dental problems, or  Sore throat,                No sneezing, itching, ear ache, nasal congestion, post nasal drip,   CV:  No chest pain,  Orthopnea, PND, swelling in lower extremities, anasarca, dizziness, palpitations, syncope.   GI  No heartburn, indigestion, abdominal pain, nausea, vomiting, diarrhea, change in bowel habits, loss of appetite, bloody stools.   Resp:  No chest wall deformity  Skin: no rash or lesions.  GU: no dysuria, change in color of urine, no urgency or frequency.  No flank pain, no hematuria   MS:  No joint pain or swelling.  No decreased range of motion.  No back pain.    Physical Exam  BP 110/78 (BP Location: Left Arm, Patient Position: Sitting, Cuff Size: Normal)    Pulse 71    Temp 98.1 F (36.7 C) (Oral)    Ht 6' (1.829 m)    Wt 214 lb 9.6 oz (97.3 kg)    SpO2 98%    BMI 29.10 kg/m   GEN: A/Ox3; pleasant , NAD, well nourished    HEENT:  [place]/AT,  EACs-clear, TMs-wnl, NOSE-clear, THROAT-clear, no lesions, no postnasal drip or exudate noted.   NECK:  Supple w/ fair ROM; no JVD; normal carotid impulses w/o bruits; no thyromegaly or nodules palpated; no lymphadenopathy.    RESP  Clear  P & A; w/o, wheezes/ rales/ or rhonchi. no accessory muscle use, no dullness to percussion  CARD:  RRR, no m/r/g, no peripheral edema, pulses intact, no cyanosis or clubbing.  GI:   Soft & nt; nml bowel sounds; no organomegaly or masses detected.   Musco: Warm bil, no deformities or joint swelling noted.   Neuro: alert, no focal deficits noted.    Skin: Warm, no lesions or rashes    Lab Results:  CBC    Component  Value Date/Time   WBC 16.4 (H) 11/27/2020 1440   WBC 11.5 (H) 09/12/2020 1611   RBC 4.56 11/27/2020 1440   RBC 5.25 09/12/2020 1611   HGB 12.6 (L) 11/27/2020 1440   HCT 39.1 11/27/2020 1440   PLT 328 11/27/2020 1440   MCV 86 11/27/2020 1440   MCH 27.6 11/27/2020 1440   MCH 27.8 09/12/2020 1611   MCHC 32.2 11/27/2020 1440   MCHC 32.6 09/12/2020 1611   RDW 13.3 11/27/2020 1440   LYMPHSABS 4,083 (H) 09/12/2020  [place] 10/06/2015 1429   EOSABS 368 09/12/2020 1611   BASOSABS 81 09/12/2020 1611    BMET    Component Value Date/Time   NA 140 11/27/2020 1440   K 5.0 11/27/2020 1440   CL 106 11/27/2020 1440   CO2 23 11/27/2020 1440   GLUCOSE 120 (H) 11/27/2020 1440   GLUCOSE 96 09/12/2020 1611   BUN 18 11/27/2020 1440   CREATININE 1.08 11/27/2020 1440   CREATININE 1.28 09/12/2020 1611   CALCIUM 9.4 11/27/2020 1440   GFRNONAA 53 (L) 04/03/2020 1632   GFRAA 62 04/03/2020 1632    BNP    Component Value Date/Time   BNP 23.2 08/03/2014 1608    ProBNP    Component Value Date/Time   PROBNP 56.3 05/18/2012 1456    Imaging: No results found.  albuterol (PROVENTIL) (2.5 MG/3ML) 0.083% nebulizer solution 2.5 mg     Date Action Dose Route User   12/20/2020 1447 Given 2.5 mg Nebulization Dayton Martes, RT       PFT Results Latest Ref Rng & Units 12/20/2020  FVC-Pre L 4.37  FVC-Predicted Pre % 93  FVC-Post L 4.22  FVC-Predicted Post % 89  Pre FEV1/FVC % % 65  Post FEV1/FCV % % 67  FEV1-Pre L 2.85  FEV1-Predicted Pre % 82  FEV1-Post L 2.83  DLCO uncorrected ml/min/mmHg 21.50  DLCO UNC% % 79  DLVA Predicted % 86  TLC L 6.62  TLC % Predicted % 89  RV % Predicted % 87    No results found for: NITRICOXIDE      Assessment & Plan:   No problem-specific Assessment & Plan notes found for this encounter.     Rexene Edison, NP 01/29/2021

## 2021-01-30 DIAGNOSIS — J449 Chronic obstructive pulmonary disease, unspecified: Secondary | ICD-10-CM | POA: Insufficient documentation

## 2021-01-30 DIAGNOSIS — R400 Somnolence: Secondary | ICD-10-CM | POA: Insufficient documentation

## 2021-01-30 MED ORDER — TIZANIDINE HCL 4 MG PO TABS: 4.0000 mg | ORAL_TABLET | Freq: Four times a day (QID) | ORAL | 0 refills | Status: DC | PRN

## 2021-01-30 NOTE — Assessment & Plan Note (Signed)
Mild COPD.  No perceived benefit with Spiriva. Does not qualify for the low-dose CT screening program Encouraged on activity.  Plan  Patient Instructions  Albuterol inhaler 1-2 puffs every 6hr as needed.  Home sleep as planned next week.  Healthy sleep regimen  Activity as tolerated.  Follow up with Dr. Mortimer Fries in 3 months and As needed

## 2021-01-30 NOTE — Assessment & Plan Note (Signed)
Home sleep study is pending 

## 2021-02-05 ENCOUNTER — Ambulatory Visit (INDEPENDENT_AMBULATORY_CARE_PROVIDER_SITE_OTHER): Payer: PPO

## 2021-02-05 ENCOUNTER — Other Ambulatory Visit: Payer: Self-pay

## 2021-02-05 ENCOUNTER — Telehealth: Payer: Self-pay | Admitting: Acute Care

## 2021-02-05 DIAGNOSIS — G4733 Obstructive sleep apnea (adult) (pediatric): Secondary | ICD-10-CM

## 2021-02-05 DIAGNOSIS — G4719 Other hypersomnia: Secondary | ICD-10-CM

## 2021-02-05 NOTE — Telephone Encounter (Signed)
Contacted patient re:LCS referral.  Patient states he quit smoking in year 2000 which would make him ineligible for LCS program.  Message  routed to referring provider Dr. Mortimer Fries that Chest CT w/o contrast would need to be placed by the provider in order for the patient to have the CT.  Patient states he would like to have an update on a Chest CT and will await further contact by Dr. Zoila Shutter office.

## 2021-02-06 DIAGNOSIS — G4733 Obstructive sleep apnea (adult) (pediatric): Secondary | ICD-10-CM | POA: Diagnosis not present

## 2021-02-15 ENCOUNTER — Ambulatory Visit: Payer: PPO | Admitting: Adult Health

## 2021-02-16 ENCOUNTER — Ambulatory Visit: Payer: PPO | Admitting: Cardiology

## 2021-02-20 ENCOUNTER — Telehealth: Payer: Self-pay | Admitting: Internal Medicine

## 2021-02-20 NOTE — Telephone Encounter (Signed)
Sleep study reviewed by Dr. Dimitri Ped OSA. Option 1. Oral device and repeat sleep study 2. Auro cpap 5-12cm h2O 3. In lab cpap titration study  Spoke to patient and relayed results. He would like to hold off on treatment for now. He would like to discuss this further at upcoming visit 04/25/2021. Nothing further needed at this time.  Routing to Dr. Mortimer Fries as an Juluis Rainier

## 2021-02-22 ENCOUNTER — Telehealth: Payer: Self-pay | Admitting: Pharmacist

## 2021-02-22 NOTE — Progress Notes (Signed)
Chronic Care Management Pharmacy Assistant   Name: [name]  MRN: [id number] DOB: 1950-01-23   Reason for Encounter: Disease State - Hypertension Call     Recent office visits:  None noted.   Recent consult visits:  01/29/21 Michael Edison, NP - Pulmonology - Albuterol inhaler 1-2 puffs every 6hr as needed. Home sleep planned in 1 week. Follow up in 3 months.   Hospital visits: 11/30/20 Medication Reconciliation was completed by comparing discharge summary, patients EMR and Pharmacy list, and upon discussion with patient.  Admitted to the hospital on 11/30/20 due to Chest pain. Discharge date was 11/30/20. Discharged from [facility]?Medications Started at [facility] Discharge:?? HYDROcodone bit-homatropine 5-1.5 MG/5ML syrup (HYCODAN)  Medication Changes at Hospital Discharge: None noted.  Medications Discontinued at Hospital Discharge: None noted.   Medications that remain the same after Hospital Discharge:??  All other medications will remain the same.    Medications: Outpatient Encounter Medications as of 02/22/2021  Medication Sig   aspirin EC 81 MG tablet Take 81 mg by mouth daily. Swallow whole.   cetirizine (ZYRTEC) 10 MG tablet TAKE 1 TABLET BY MOUTH EVERY DAY   ezetimibe (ZETIA) 10 MG tablet Take 10 mg by mouth at bedtime.   HYDROcodone bit-homatropine (HYCODAN) 5-1.5 MG/5ML syrup Take 5 mLs by mouth every 8 (eight) hours as needed for cough.   isosorbide mononitrate (IMDUR) 30 MG 24 hr tablet Take 0.5 tablets (15 mg total) by mouth daily.   losartan (COZAAR) 50 MG tablet Take 50 mg by mouth in the morning.   nitroGLYCERIN (NITROSTAT) 0.4 MG SL tablet Place 1 tablet (0.4 mg total) under the tongue every 5 (five) minutes as needed for chest pain.   pantoprazole (PROTONIX) 40 MG tablet Take 1 tablet (40 mg total) by mouth daily.   rosuvastatin (CRESTOR) 40 MG tablet TAKE 1 TABLET BY MOUTH EVERY DAY   ticagrelor (BRILINTA) 90 MG TABS  tablet Take 1 tablet (90 mg total) by mouth 2 (two) times daily.   Tiotropium Bromide Monohydrate (SPIRIVA RESPIMAT) 2.5 MCG/ACT AERS Inhale 2 puffs into the lungs daily.   Tiotropium Bromide Monohydrate (SPIRIVA RESPIMAT) 2.5 MCG/ACT AERS Inhale 2 puffs into the lungs daily.   tiZANidine (ZANAFLEX) 4 MG tablet Take 1 tablet (4 mg total) by mouth every 6 (six) hours as needed for muscle spasms.   No facility-administered encounter medications on file as of 02/22/2021.    Current antihypertensive regimen:  Losartan 50mg   How often are you checking your Blood Pressure?  Patient reported checking blood pressures every other day. He reported he has not had any concerns.    Current home BP readings: 130/82 (02/21/21)   What recent interventions/DTPs have been made by any provider to improve Blood Pressure control since last CPP Visit:  Patient reported no changes in his current medication regimen.    Any recent hospitalizations or ED visits since last visit with CPP?  Patient did have an ED visit for chest pain on 11/30/20. Patient denies any chest pain since visit.   What diet changes have been made to improve Blood Pressure Control?  Patient reported he has tried to cut back on his salt intake.    What exercise is being done to improve your Blood Pressure Control?  Patient reported he is active and still works and walks a lot while he is at work.     Adherence Review: Is the patient currently on ACE/ARB medication? Yes Does the  patient have >5 day gap between last estimated fill dates? No   Care Gaps  AWV: done 11/09/20 Colonoscopy: done 11/30/19 DM Eye Exam: N/A DM Foot Exam: N/A Microalbumin: N/A HbgAIC: N/A DEXA:  N/A Mammogram:N/A  Star Rating Drugs: rosuvastatin (CRESTOR) 40 MG tablet - last filled  12/25/20 90 days  losartan (COZAAR) 50 MG tablet - last filled 01/02/21 90 days  Future Appointments  Date Time Provider [place]  04/25/2021  4:30 PM Michael Lipps, MD [facility] None  05/10/2021 12:30 PM [facility]-CCM PHARMACIST [facility] None  06/11/2021  4:20 PM Michael Sable, MD [facility]  11/09/2021  2:00 PM [facility]-NURSE HEALTH ADVISOR [facility] None    [name], [facility] Clinical Pharmacist Assistant  [phone]

## 2021-03-05 ENCOUNTER — Other Ambulatory Visit: Payer: Self-pay

## 2021-03-05 ENCOUNTER — Ambulatory Visit (INDEPENDENT_AMBULATORY_CARE_PROVIDER_SITE_OTHER): Payer: PPO | Admitting: Family Medicine

## 2021-03-05 ENCOUNTER — Telehealth: Payer: Self-pay | Admitting: Family Medicine

## 2021-03-05 VITALS — BP 122/82 | HR 69 | Temp 97.7°F | Ht 72.0 in | Wt 219.6 lb

## 2021-03-05 DIAGNOSIS — J069 Acute upper respiratory infection, unspecified: Secondary | ICD-10-CM | POA: Diagnosis not present

## 2021-03-05 MED ORDER — TIZANIDINE HCL 4 MG PO TABS: 4.0000 mg | ORAL_TABLET | Freq: Four times a day (QID) | ORAL | 0 refills | Status: DC | PRN

## 2021-03-05 MED ORDER — AZITHROMYCIN 250 MG PO TABS: ORAL_TABLET | ORAL | 0 refills | Status: DC

## 2021-03-05 NOTE — Telephone Encounter (Signed)
During office visit today, patient forgot to request refills of the following med:  tiZANidine (ZANAFLEX) 4 MG tablet [[id number]]   Pharmacy confirmed as  [id number] - [place], [place] - [address]  [address], [place] [address]  Phone:  [phone]  Fax:  [phone]  DEA #:  [id number]  Please adivise at [phone].

## 2021-03-05 NOTE — Telephone Encounter (Signed)
Med sent by Dr Dennard Schaumann

## 2021-03-05 NOTE — Progress Notes (Signed)
Subjective:    Patient ID: [name], male    DOB: [birth year], 71 y.o.   MRN: [id number]  Sinus Problem Patient reports pain and pressure in his sinuses.  He reports that congestion.  He reports that his frontal and maxillary sinuses feel "stuffed up".  He reports nonproductive cough.  He reports body aches.  He denies any fever or chills or shortness of breath. Past Medical History:  Diagnosis Date   Adenomatous colon polyp 2004   Allergy    Diverticulosis 2011   Former smoker    HLD (hyperlipidemia)    Hyperplastic colon polyp 2004   Hypertension    Melanoma in situ [facility])    Past Surgical History:  Procedure Laterality Date   COLONOSCOPY  01/21/2009   SEVERAL    CORONARY STENT INTERVENTION N/A 10/04/2020   Procedure: CORONARY STENT INTERVENTION;  Surgeon: Leonie Man, MD;  Location: [facility];  Service: Cardiovascular;  Laterality: N/A;   LEFT HEART CATH AND CORONARY ANGIOGRAPHY N/A 10/04/2020   Procedure: LEFT HEART CATH AND CORONARY ANGIOGRAPHY;  Surgeon: Leonie Man, MD;  Location: [facility];  Service: Cardiovascular;  Laterality: N/A;   LEFT HEART CATH AND CORONARY ANGIOGRAPHY Left 11/30/2020   Procedure: LEFT HEART CATH AND CORONARY ANGIOGRAPHY;  Surgeon: Leonie Man, MD;  Location: [facility];  Service: Cardiovascular;  Laterality: Left;   TOTAL KNEE ARTHROPLASTY Right 04/26/2020   Current Outpatient Medications on File Prior to Visit  Medication Sig Dispense Refill   aspirin EC 81 MG tablet Take 81 mg by mouth daily. Swallow whole.     cetirizine (ZYRTEC) 10 MG tablet TAKE 1 TABLET BY MOUTH EVERY DAY 90 tablet 3   ezetimibe (ZETIA) 10 MG tablet Take 10 mg by mouth at bedtime.     HYDROcodone bit-homatropine (HYCODAN) 5-1.5 MG/5ML syrup Take 5 mLs by mouth every 8 (eight) hours as needed for cough. 120 mL 0   isosorbide mononitrate (IMDUR) 30 MG 24 hr tablet Take 0.5 tablets (15 mg total) by mouth daily. 45 tablet 2   losartan  (COZAAR) 50 MG tablet Take 50 mg by mouth in the morning.     nitroGLYCERIN (NITROSTAT) 0.4 MG SL tablet Place 1 tablet (0.4 mg total) under the tongue every 5 (five) minutes as needed for chest pain. 50 tablet 3   pantoprazole (PROTONIX) 40 MG tablet Take 1 tablet (40 mg total) by mouth daily. 30 tablet 5   rosuvastatin (CRESTOR) 40 MG tablet TAKE 1 TABLET BY MOUTH EVERY DAY 30 tablet 4   ticagrelor (BRILINTA) 90 MG TABS tablet Take 1 tablet (90 mg total) by mouth 2 (two) times daily. 180 tablet 2   Tiotropium Bromide Monohydrate (SPIRIVA RESPIMAT) 2.5 MCG/ACT AERS Inhale 2 puffs into the lungs daily. 2 each 5   Tiotropium Bromide Monohydrate (SPIRIVA RESPIMAT) 2.5 MCG/ACT AERS Inhale 2 puffs into the lungs daily. 4 g 0   tiZANidine (ZANAFLEX) 4 MG tablet Take 1 tablet (4 mg total) by mouth every 6 (six) hours as needed for muscle spasms. 30 tablet 0   No current facility-administered medications on file prior to visit.   Allergies  Allergen Reactions   Levaquin [Levofloxacin In D5w] Shortness Of Breath   Crestor [Rosuvastatin Calcium] Diarrhea   Social History   Socioeconomic History   Marital status: Married    Spouse name: [name]   Number of children: 2   Years of education: Not on file   Highest  education level: Not on file  Occupational History   Occupation: part Scientist, clinical (histocompatibility and immunogenetics): [employer]  Tobacco Use   Smoking status: Former    Packs/day: 1.00    Years: 30.00    Pack years: 30.00    Types: Cigarettes, Cigars    Quit date: 05/13/1998    Years since quitting: 22.8   Smokeless tobacco: Current    Types: Chew  Substance and Sexual Activity   Alcohol use: Yes    Alcohol/week: 3.0 standard drinks    Types: 3 Cans of beer per week   Drug use: No   Sexual activity: Not Currently  Other Topics Concern   Not on file  Social History Narrative   Married x 56 years   2 grandsons   Social Determinants of Radio broadcast assistant Strain: Low Risk    Difficulty of Paying  Living Expenses: Not hard at all  Food Insecurity: No Food Insecurity   Worried About Charity fundraiser in the Last Year: Never true   Arboriculturist in the Last Year: Never true  Transportation Needs: No Transportation Needs   Lack of Transportation (Medical): No   Lack of Transportation (Non-Medical): No  Physical Activity: Sufficiently Active   Days of Exercise per Week: 5 days   Minutes of Exercise per Session: 30 min  Stress: No Stress Concern Present   Feeling of Stress : Not at all  Social Connections: Socially Integrated   Frequency of Communication with Friends and Family: Three times a week   Frequency of Social Gatherings with Friends and Family: Three times a week   Attends Religious Services: More than 4 times per year   Active Member of Clubs or Organizations: Yes   Attends Music therapist: More than 4 times per year   Marital Status: Married  Human resources officer Violence: Not At Risk   Fear of Current or Ex-Partner: No   Emotionally Abused: No   Physically Abused: No   Sexually Abused: No      Review of Systems  All other systems reviewed and are negative.     Objective:   Physical Exam Vitals reviewed.  Constitutional:      General: He is not in acute distress.    Appearance: He is well-developed. He is not diaphoretic.  HENT:     Right Ear: Tympanic membrane, ear canal and external ear normal.     Left Ear: Tympanic membrane, ear canal and external ear normal.     Nose: Congestion and rhinorrhea present. No mucosal edema.     Right Sinus: Frontal sinus tenderness present. No maxillary sinus tenderness.     Left Sinus: Frontal sinus tenderness present. No maxillary sinus tenderness.     Mouth/Throat:     Pharynx: No oropharyngeal exudate.  Eyes:     Conjunctiva/sclera: Conjunctivae normal.     Pupils: Pupils are equal, round, and reactive to light.  Cardiovascular:     Rate and Rhythm: Normal rate and regular rhythm.     Heart sounds:  Normal heart sounds.  Pulmonary:     Effort: Pulmonary effort is normal. No respiratory distress.     Breath sounds: Normal breath sounds. No wheezing or rales.  Musculoskeletal:     Cervical back: Neck supple.  Lymphadenopathy:     Cervical: No cervical adenopathy.          Assessment & Plan:  Viral upper respiratory tract infection - Plan: SARS-COV-2 RNA,(COVID-19) QUAL  NAAT I believe the patient has a viral upper respiratory infection although he is starting to develop signs of rhinosinusitis.  Gave the patient a Z-Pak in case symptoms worsen however anticipate gradual improvement in symptoms over the next 3 to 4 days.  I will screen the patient for COVID.

## 2021-03-06 LAB — SARS-COV-2 RNA,(COVID-19) QUALITATIVE NAAT: SARS CoV2 RNA: DETECTED — AB

## 2021-03-07 ENCOUNTER — Other Ambulatory Visit: Payer: Self-pay | Admitting: Family Medicine

## 2021-03-07 ENCOUNTER — Telehealth: Payer: Self-pay | Admitting: Family Medicine

## 2021-03-07 MED ORDER — MOLNUPIRAVIR EUA 200MG CAPSULE: 4.0000 | ORAL_CAPSULE | Freq: Two times a day (BID) | ORAL | 0 refills | Status: AC

## 2021-03-07 NOTE — Telephone Encounter (Signed)
Patient called to report positive COVID test result received 03/07/2021. States he thought it was a head cold and has been going to work. Wants to know if he needs to stay home.   Please advise at [phone].

## 2021-03-08 NOTE — Telephone Encounter (Signed)
Spoke with pt and explained quarantine. Pt is aware of rx sent in. Pt will call if not improving. Nothing further needed at this time.

## 2021-03-27 ENCOUNTER — Other Ambulatory Visit: Payer: Self-pay | Admitting: *Deleted

## 2021-03-27 MED ORDER — ROSUVASTATIN CALCIUM 40 MG PO TABS: 40.0000 mg | ORAL_TABLET | Freq: Every day | ORAL | 2 refills | Status: DC

## 2021-04-05 ENCOUNTER — Telehealth: Payer: Self-pay | Admitting: Pharmacist

## 2021-04-05 NOTE — Progress Notes (Signed)
? ? ?Chronic Care Management ?Pharmacy Assistant  ? ?Name: [name]  MRN: [id number] DOB: [birth year] ? ? ?Reason for Encounter: Disease State - Hypertension Call  ?  ? ?Recent office visits:  ?03/05/21 [name], [place] [facility] test performed. azithromycin (ZITHROMAX) 250 MG tablet prescribed. Follow up in 3-4 days if no improvement.  ? ? ?Recent consult visits:  ?None noted.  ? ? ?Hospital visits: 11/30/20 ?Medication Reconciliation was completed by comparing discharge summary, patient?s EMR and Pharmacy list, and upon discussion with patient. ?  ?Admitted to the hospital on 11/30/20 due to Chest pain. Discharge date was 11/30/20. Discharged from [facility].   ?  ?New?Medications Started at [facility] Discharge:?? ?HYDROcodone bit-homatropine 5-1.5 MG/5ML syrup (HYCODAN) ?  ?Medication Changes at Hospital Discharge: ?None noted. ?  ?Medications Discontinued at Hospital Discharge: ?None noted.  ?  ?Medications that remain the same after Hospital Discharge:??  ?All other medications will remain the same.   ? ? ?Medications: ?Outpatient Encounter Medications as of 04/05/2021  ?Medication Sig  ? aspirin EC 81 MG tablet Take 81 mg by mouth daily. Swallow whole.  ? azithromycin (ZITHROMAX) 250 MG tablet 2 tabs poqday1, 1 tab poqday 2-5  ? cetirizine (ZYRTEC) 10 MG tablet TAKE 1 TABLET BY MOUTH EVERY DAY  ? ezetimibe (ZETIA) 10 MG tablet Take 10 mg by mouth at bedtime.  ? HYDROcodone bit-homatropine (HYCODAN) 5-1.5 MG/5ML syrup Take 5 mLs by mouth every 8 (eight) hours as needed for cough.  ? isosorbide mononitrate (IMDUR) 30 MG 24 hr tablet Take 0.5 tablets (15 mg total) by mouth daily.  ? losartan (COZAAR) 50 MG tablet Take 50 mg by mouth in the morning.  ? nitroGLYCERIN (NITROSTAT) 0.4 MG SL tablet Place 1 tablet (0.4 mg total) under the tongue every 5 (five) minutes as needed for chest pain.  ? pantoprazole (PROTONIX) 40 MG tablet Take 1 tablet (40 mg total) by mouth daily.  ?  rosuvastatin (CRESTOR) 40 MG tablet Take 1 tablet (40 mg total) by mouth daily.  ? ticagrelor (BRILINTA) 90 MG TABS tablet Take 1 tablet (90 mg total) by mouth 2 (two) times daily.  ? Tiotropium Bromide Monohydrate (SPIRIVA RESPIMAT) 2.5 MCG/ACT AERS Inhale 2 puffs into the lungs daily.  ? Tiotropium Bromide Monohydrate (SPIRIVA RESPIMAT) 2.5 MCG/ACT AERS Inhale 2 puffs into the lungs daily.  ? tiZANidine (ZANAFLEX) 4 MG tablet Take 1 tablet (4 mg total) by mouth every 6 (six) hours as needed for muscle spasms.  ? ?No facility-administered encounter medications on file as of 04/05/2021.  ? ? ?Current antihypertensive regimen:  ?Losartan '50mg'$  ? ?How often are you checking your Blood Pressure?  ?Patient reported he has not been checking blood pressures this week.  ? ? ?Current home BP readings: 125/80 (last week) ? ? ?What recent interventions/DTPs have been made by any provider to improve Blood Pressure control since last CPP Visit:  ?Patient denied any recent medication changes.  ? ? ?Any recent hospitalizations or ED visits since last visit with CPP?  ?Patient did have an ED visit on 11/30/20 due to Chest pain. ? ? ?What diet changes have been made to improve Blood Pressure Control?  ?Patient reported he has cut back on his salt intake and working on his diet.  ? ? ?What exercise is being done to improve your Blood Pressure Control?  ?Patient reported he has not been as active due to the cold weather but he does try and be as  active as possible and does occasional yard work.  ? ? ? ?Adherence Review: ?Is the patient currently on ACE/ARB medication? Yes ?Does the patient have >5 day gap between last estimated fill dates? No ? ? ?Care Gaps ?  ?AWV: done 11/09/20 ?Colonoscopy: done 11/30/19 ?DM Eye Exam: N/A ?DM Foot Exam: N/A ?Microalbumin: N/A ?HbgAIC: N/A ?DEXA:  N/A ?Mammogram:N/A ? ? ?Star Rating Drugs: ?rosuvastatin (CRESTOR) 40 MG tablet - last filled  12/25/20 90 days  ?losartan (COZAAR) 50 MG tablet - last  filled 01/02/21 90 days ? ? ?Future Appointments  ?Date Time Provider [place]  ?04/25/2021  4:30 PM Flora Lipps, MD [facility] None  ?06/11/2021  2:40 PM Agbor-Etang, Aaron Edelman, MD [facility]  ?08/09/2021 11:00 AM [facility] PHARMACIST [facility]  ?11/09/2021  2:00 PM BSF[facility]-NURSE HEALTH ADVISOR [facility]  ? ? ? ?Liza Showfety, CCMA ?Clinical Pharmacist Assistant  ?([phone] ? ?

## 2021-04-07 ENCOUNTER — Other Ambulatory Visit: Payer: Self-pay | Admitting: Family Medicine

## 2021-04-09 ENCOUNTER — Other Ambulatory Visit: Payer: Self-pay

## 2021-04-09 ENCOUNTER — Telehealth: Payer: Self-pay | Admitting: Cardiology

## 2021-04-09 NOTE — Telephone Encounter (Signed)
Patient states he has a dental appointment on this Thursday, for a cavity filling and cleaning, and asks if he needs a antibiotic. Please call and advise.

## 2021-04-09 NOTE — Telephone Encounter (Signed)
Called patient back and informed him that he does not need any antibiotics from a cardiac standpoint. Patient informed me that his knee doctor required it after a knee replacement last year. I advised that he call his knee doctors office to be sure that they do not require it. ?Patient verbalized understanding and agreed with plan. ?

## 2021-04-13 ENCOUNTER — Encounter: Payer: Self-pay | Admitting: Family Medicine

## 2021-04-13 ENCOUNTER — Other Ambulatory Visit: Payer: Self-pay

## 2021-04-13 ENCOUNTER — Ambulatory Visit (INDEPENDENT_AMBULATORY_CARE_PROVIDER_SITE_OTHER): Payer: PPO | Admitting: Family Medicine

## 2021-04-13 VITALS — BP 128/72 | HR 69 | Temp 97.0°F | Ht 72.0 in | Wt 220.2 lb

## 2021-04-13 DIAGNOSIS — B354 Tinea corporis: Secondary | ICD-10-CM | POA: Diagnosis not present

## 2021-04-13 MED ORDER — FLUCONAZOLE 150 MG PO TABS: 150.0000 mg | ORAL_TABLET | ORAL | 0 refills | Status: DC

## 2021-04-13 NOTE — Progress Notes (Signed)
? ?Subjective:  ? ? Patient ID: [name], male    DOB: Apr 09, 1950, 71 y.o.   MRN: [id number] ? ?Patient is a 71 year old Caucasian gentleman who presents today with 3 patches of erythematous skin.  They are located on his knee.  It is on his right knee.  1 patch is roughly 4 cm in diameter.  This is directly over the patella.  It is a sharp serpiginous rash with a raised rolled edges and central scale.  Below it are 2 smaller patches 1 is 2 cm in diameter and the other is approximately 3 cm in diameter.  They do not itch.  They do not burn.  1 has been present for almost 10 months.  The others have been present just for a month ?Past Medical History:  ?Diagnosis Date  ? Adenomatous colon polyp 2004  ? Allergy   ? Diverticulosis 2011  ? Former smoker   ? HLD (hyperlipidemia)   ? Hyperplastic colon polyp 2004  ? Hypertension   ? Melanoma in situ [facility])   ? ?Past Surgical History:  ?Procedure Laterality Date  ? COLONOSCOPY  01/21/2009  ? SEVERAL   ? CORONARY STENT INTERVENTION N/A 10/04/2020  ? Procedure: CORONARY STENT INTERVENTION;  Surgeon: Leonie Man, MD;  Location: [place] [facility];  Service: Cardiovascular;  Laterality: N/A;  ? LEFT HEART CATH AND CORONARY ANGIOGRAPHY N/A 10/04/2020  ? Procedure: LEFT HEART CATH AND CORONARY ANGIOGRAPHY;  Surgeon: Leonie Man, MD;  Location: [place] [facility];  Service: Cardiovascular;  Laterality: N/A;  ? LEFT HEART CATH AND CORONARY ANGIOGRAPHY Left 11/30/2020  ? Procedure: LEFT HEART CATH AND CORONARY ANGIOGRAPHY;  Surgeon: Leonie Man, MD;  Location: [place] [facility];  Service: Cardiovascular;  Laterality: Left;  ? TOTAL KNEE ARTHROPLASTY Right 04/26/2020  ? ?Current Outpatient Medications on File Prior to Visit  ?Medication Sig Dispense Refill  ? aspirin EC 81 MG tablet Take 81 mg by mouth daily. Swallow whole.    ? cetirizine (ZYRTEC) 10 MG tablet TAKE 1 TABLET BY MOUTH EVERY DAY 90 tablet 3  ? ezetimibe (ZETIA) 10 MG tablet TAKE 1 TABLET BY MOUTH  EVERY DAY 90 tablet 3  ? isosorbide mononitrate (IMDUR) 30 MG 24 hr tablet Take 0.5 tablets (15 mg total) by mouth daily. 45 tablet 2  ? losartan (COZAAR) 50 MG tablet Take 50 mg by mouth in the morning.    ? nitroGLYCERIN (NITROSTAT) 0.4 MG SL tablet Place 1 tablet (0.4 mg total) under the tongue every 5 (five) minutes as needed for chest pain. 50 tablet 3  ? pantoprazole (PROTONIX) 40 MG tablet Take 1 tablet (40 mg total) by mouth daily. 30 tablet 5  ? rosuvastatin (CRESTOR) 40 MG tablet Take 1 tablet (40 mg total) by mouth daily. 30 tablet 2  ? ticagrelor (BRILINTA) 90 MG TABS tablet Take 1 tablet (90 mg total) by mouth 2 (two) times daily. 180 tablet 2  ? tiZANidine (ZANAFLEX) 4 MG tablet Take 1 tablet (4 mg total) by mouth every 6 (six) hours as needed for muscle spasms. 30 tablet 0  ? ?No current facility-administered medications on file prior to visit.  ? ?Allergies  ?Allergen Reactions  ? Levaquin [Levofloxacin In D5w] Shortness Of Breath  ? Crestor [Rosuvastatin Calcium] Diarrhea  ? ?Social History  ? ?Socioeconomic History  ? Marital status: Married  ?  Spouse name: Vanita Ingles  ? Number of children: 2  ? Years of education: Not on file  ?  Highest education level: Not on file  ?Occupational History  ? Occupation: part sales  ?  Employer: [employer]  ?Tobacco Use  ? Smoking status: Former  ?  Packs/day: 1.00  ?  Years: 30.00  ?  Pack years: 30.00  ?  Types: Cigarettes, Cigars  ?  Quit date: 05/13/1998  ?  Years since quitting: 22.9  ? Smokeless tobacco: Current  ?  Types: Chew  ?Substance and Sexual Activity  ? Alcohol use: Yes  ?  Alcohol/week: 3.0 standard drinks  ?  Types: 3 Cans of beer per week  ? Drug use: No  ? Sexual activity: Not Currently  ?Other Topics Concern  ? Not on file  ?Social History Narrative  ? Married x 51 years  ? 2 grandsons  ? ?Social Determinants of Health  ? ?Financial Resource Strain: Low Risk   ? Difficulty of Paying Living Expenses: Not hard at all  ?Food Insecurity: No Food Insecurity   ? Worried About Charity fundraiser in the Last Year: Never true  ? Ran Out of Food in the Last Year: Never true  ?Transportation Needs: No Transportation Needs  ? Lack of Transportation (Medical): No  ? Lack of Transportation (Non-Medical): No  ?Physical Activity: Sufficiently Active  ? Days of Exercise per Week: 5 days  ? Minutes of Exercise per Session: 30 min  ?Stress: No Stress Concern Present  ? Feeling of Stress : Not at all  ?Social Connections: Socially Integrated  ? Frequency of Communication with Friends and Family: Three times a week  ? Frequency of Social Gatherings with Friends and Family: Three times a week  ? Attends Religious Services: More than 4 times per year  ? Active Member of Clubs or Organizations: Yes  ? Attends Archivist Meetings: More than 4 times per year  ? Marital Status: Married  ?Intimate Partner Violence: Not At Risk  ? Fear of Current or Ex-Partner: No  ? Emotionally Abused: No  ? Physically Abused: No  ? Sexually Abused: No  ? ? ? ? ?Review of Systems  ?All other systems reviewed and are negative. ? ?   ?Objective:  ? Physical Exam ?Vitals reviewed.  ?Constitutional:   ?   General: He is not in acute distress. ?   Appearance: He is well-developed. He is not diaphoretic.  ?HENT:  ?   Nose: Congestion and rhinorrhea present. No mucosal edema.  ?   Right Sinus: Frontal sinus tenderness present. No maxillary sinus tenderness.  ?   Left Sinus: Frontal sinus tenderness present. No maxillary sinus tenderness.  ?   Mouth/Throat:  ?   Pharynx: No oropharyngeal exudate.  ?Eyes:  ?   Conjunctiva/sclera: Conjunctivae normal.  ?   Pupils: Pupils are equal, round, and reactive to light.  ?Cardiovascular:  ?   Rate and Rhythm: Normal rate and regular rhythm.  ?   Heart sounds: Normal heart sounds.  ?Pulmonary:  ?   Effort: Pulmonary effort is normal. No respiratory distress.  ?   Breath sounds: Normal breath sounds. No wheezing or rales.  ?Musculoskeletal:  ?   Cervical back: Neck  supple.  ?     Legs: ? ?Lymphadenopathy:  ?   Cervical: No cervical adenopathy.  ?Skin: ?   Findings: Erythema and rash present.  ? ? ? ? ? ?   ?Assessment & Plan:  ?Tinea corporis ?I believe this is tinea corporis.  Begin Diflucan 150 mg weekly for 4 weeks and then recheck.  Would recommend a biopsy if persistent to rule out nummular eczema versus granuloma annulare. ?

## 2021-04-16 ENCOUNTER — Telehealth: Payer: Self-pay | Admitting: Internal Medicine

## 2021-04-16 ENCOUNTER — Telehealth: Payer: Self-pay | Admitting: Cardiology

## 2021-04-16 NOTE — Telephone Encounter (Signed)
? ?  Pre-operative Risk Assessment  ?  ?Patient Name: [name]  ?DOB: [birth year] ?MRN: [id number]  ? ? ? ?Request for Surgical Clearance   ? ?Procedure:  Dental Extraction - Amount of Teeth to be Pulled:  1 tooth - #16 ? ?Date of Surgery:  Clearance 04/17/21                              ?   ?Surgeon:  Dr Sharlett Iles ?Surgeon's Group or Practice Name:  [facility] ?Phone number:  [phone] ?Fax number:  [phone] ?  ?Type of Clearance Requested:   ?- Medical  ?  ?Type of Anesthesia:  Not Indicated ?  ?Additional requests/questions:  Please advise surgeon/provider what medications should be held. ? ?Signed, ?Caryl Pina Gerringer   ?04/16/2021, 12:58 PM  ? ?

## 2021-04-16 NOTE — Telephone Encounter (Signed)
? ?  Patient Name: [name]  ?DOB: [birth year] ?MRN: [id number] ? ?Primary Cardiologist: Kate Sable, MD ? ?Chart reviewed as part of pre-operative protocol coverage.  ? ?A single dental extractions is considered low risk procedures per guidelines and generally do not require any specific cardiac clearance. It is also generally accepted that for simple extractions and dental cleanings, there is no need to interrupt blood thinner therapy. Would not stop aspirin or Brilinta for single extraction.  ? ?SBE prophylaxis is not required for the patient from a cardiac standpoint. ? ?I will route this recommendation to the requesting party via Epic fax function and remove from pre-op pool. ? ?Please call with questions. ? ?Charlie Pitter, PA-C ?04/16/2021, 2:18 PM ? ?

## 2021-04-16 NOTE — Telephone Encounter (Deleted)
error 

## 2021-04-16 NOTE — Telephone Encounter (Signed)
error 

## 2021-04-25 ENCOUNTER — Ambulatory Visit: Payer: PPO | Admitting: Internal Medicine

## 2021-04-25 ENCOUNTER — Encounter: Payer: Self-pay | Admitting: Internal Medicine

## 2021-04-25 VITALS — BP 118/62 | HR 67 | Temp 97.8°F | Ht 72.0 in | Wt 212.4 lb

## 2021-04-25 DIAGNOSIS — J449 Chronic obstructive pulmonary disease, unspecified: Secondary | ICD-10-CM | POA: Diagnosis not present

## 2021-04-25 DIAGNOSIS — G4733 Obstructive sleep apnea (adult) (pediatric): Secondary | ICD-10-CM

## 2021-04-25 NOTE — Patient Instructions (Addendum)
Avoid allergens  ?avoid secondhand smoke  ?avoid sick contacts ? ?Weight loss goal 200 pounds ?
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
## 2021-05-10 ENCOUNTER — Telehealth: Payer: PPO

## 2021-05-15 DIAGNOSIS — M545 Low back pain, unspecified: Secondary | ICD-10-CM | POA: Diagnosis not present

## 2021-05-15 DIAGNOSIS — M1712 Unilateral primary osteoarthritis, left knee: Secondary | ICD-10-CM | POA: Diagnosis not present

## 2021-05-15 DIAGNOSIS — M25562 Pain in left knee: Secondary | ICD-10-CM | POA: Diagnosis not present

## 2021-05-29 ENCOUNTER — Other Ambulatory Visit: Payer: Self-pay

## 2021-05-29 DIAGNOSIS — E785 Hyperlipidemia, unspecified: Secondary | ICD-10-CM

## 2021-06-06 ENCOUNTER — Other Ambulatory Visit
Admission: RE | Admit: 2021-06-06 | Discharge: 2021-06-06 | Disposition: A | Discharge status: Home / Self Care | Payer: PPO | Source: Ambulatory Visit | Attending: Cardiology | Admitting: Cardiology

## 2021-06-06 DIAGNOSIS — E785 Hyperlipidemia, unspecified: Secondary | ICD-10-CM | POA: Insufficient documentation

## 2021-06-06 LAB — LIPID PANEL
Cholesterol: 93 mg/dL (ref 0–200)
HDL: 36 mg/dL — ABNORMAL LOW (ref >40)
LDL Cholesterol: 43 mg/dL (ref 0–99)
Total CHOL/HDL Ratio: 2.6 RATIO
Triglycerides: 70 mg/dL (ref <150)
VLDL: 14 mg/dL (ref 0–40)

## 2021-06-07 DIAGNOSIS — M1712 Unilateral primary osteoarthritis, left knee: Secondary | ICD-10-CM | POA: Diagnosis not present

## 2021-06-11 ENCOUNTER — Encounter: Payer: Self-pay | Admitting: Cardiology

## 2021-06-11 ENCOUNTER — Ambulatory Visit: Payer: PPO | Admitting: Cardiology

## 2021-06-11 VITALS — BP 110/62 | HR 77 | Ht 72.0 in | Wt 213.0 lb

## 2021-06-11 DIAGNOSIS — I10 Essential (primary) hypertension: Secondary | ICD-10-CM

## 2021-06-11 DIAGNOSIS — E7800 Pure hypercholesterolemia, unspecified: Secondary | ICD-10-CM | POA: Diagnosis not present

## 2021-06-11 DIAGNOSIS — Z01810 Encounter for preprocedural cardiovascular examination: Secondary | ICD-10-CM | POA: Diagnosis not present

## 2021-06-11 DIAGNOSIS — I2510 Atherosclerotic heart disease of native coronary artery without angina pectoris: Secondary | ICD-10-CM | POA: Diagnosis not present

## 2021-06-11 MED ORDER — LOSARTAN POTASSIUM 50 MG PO TABS: 25.0000 mg | ORAL_TABLET | Freq: Every morning | ORAL | 1 refills | Status: DC

## 2021-06-11 MED ORDER — CLOPIDOGREL BISULFATE 75 MG PO TABS: 75.0000 mg | ORAL_TABLET | Freq: Every day | ORAL | 3 refills | Status: DC

## 2021-06-11 NOTE — Progress Notes (Signed)
Cardiology Office Note:    Date:  06/11/2021   ID:  [name], DOB 1950-09-25, MRN [id number]  PCP:  Susy Frizzle, MD   [facility] Providers Cardiologist:  Kate Sable, MD     Referring MD: Susy Frizzle, MD   Chief Complaint  Patient presents with   Follow-up    6 month F/U-Patient reports having chest tightness this past Saturday (all day) but he did not feel it warranted taking Nitroglycerin.    History of Present Illness:    [name] is a 71 y.o. male with a hx of CAD s/p PCI p LAD, 09/2020, angina, hypertension, hyperlipidemia, former smoker x30 years who presents for follow-up.  Being seen for CAD and stable angina.  Imdur previously decreased to 15 mg daily due to side effects of headaches.  He states doing well overall, rarely has chest pain, had 1 episode of chest muscle tightness 2 days ago, did not need nitroglycerin.  He is planning on having left knee surgery in the near future.  Complains of bruising easily with minimal trauma.  Also has some dizziness when standing up from seated position.   Prior notes Echo 09/28/2020 EF 60 to 65%, impaired relaxation. Repeat LHC 11/2020 patent mid LAD stent, 75% proximal RCA, nondominant vessel. Left heart cath 10/04/2020 proximal to mid LAD 80% stenosis, s/p drug-eluting stent.  RCA 75% stenosis, small nondominant.  Not PCI target. Coronary CTA 09/2020, calcium score 728, significant proximal LAD disease.  Moderate proximal RCA disease.   Past Medical History:  Diagnosis Date   Adenomatous colon polyp 2004   Allergy    Diverticulosis 2011   Former smoker    HLD (hyperlipidemia)    Hyperplastic colon polyp 2004   Hypertension    Melanoma in situ [facility])     Past Surgical History:  Procedure Laterality Date   COLONOSCOPY  01/21/2009   SEVERAL    CORONARY STENT INTERVENTION N/A 10/04/2020   Procedure: CORONARY STENT INTERVENTION;  Surgeon: Leonie Man, MD;  Location: [place] [facility];   Service: Cardiovascular;  Laterality: N/A;   LEFT HEART CATH AND CORONARY ANGIOGRAPHY N/A 10/04/2020   Procedure: LEFT HEART CATH AND CORONARY ANGIOGRAPHY;  Surgeon: Leonie Man, MD;  Location: [place] [facility];  Service: Cardiovascular;  Laterality: N/A;   LEFT HEART CATH AND CORONARY ANGIOGRAPHY Left 11/30/2020   Procedure: LEFT HEART CATH AND CORONARY ANGIOGRAPHY;  Surgeon: Leonie Man, MD;  Location: [place] [facility];  Service: Cardiovascular;  Laterality: Left;   TOOTH EXTRACTION     TOTAL KNEE ARTHROPLASTY Right 04/26/2020    Current Medications: Current Meds  Medication Sig   aspirin EC 81 MG tablet Take 81 mg by mouth daily. Swallow whole.   cetirizine (ZYRTEC) 10 MG tablet TAKE 1 TABLET BY MOUTH EVERY DAY   clopidogrel (PLAVIX) 75 MG tablet Take 1 tablet (75 mg total) by mouth daily.   ezetimibe (ZETIA) 10 MG tablet TAKE 1 TABLET BY MOUTH EVERY DAY   isosorbide mononitrate (IMDUR) 30 MG 24 hr tablet Take 0.5 tablets (15 mg total) by mouth daily.   metoprolol succinate (TOPROL-XL) 25 MG 24 hr tablet Take 25 mg by mouth daily.   nitroGLYCERIN (NITROSTAT) 0.4 MG SL tablet Place 1 tablet (0.4 mg total) under the tongue every 5 (five) minutes as needed for chest pain.   pantoprazole (PROTONIX) 40 MG tablet Take 1 tablet (40 mg total) by mouth daily.   rosuvastatin (CRESTOR) 40 MG  tablet Take 1 tablet (40 mg total) by mouth daily.   tiZANidine (ZANAFLEX) 4 MG tablet Take 1 tablet (4 mg total) by mouth every 6 (six) hours as needed for muscle spasms.   [DISCONTINUED] losartan (COZAAR) 50 MG tablet Take 50 mg by mouth in the morning.   [DISCONTINUED] ticagrelor (BRILINTA) 90 MG TABS tablet Take 1 tablet (90 mg total) by mouth 2 (two) times daily.     Allergies:   Levaquin [levofloxacin in d5w]   Social History   Socioeconomic History   Marital status: Married    Spouse name: [name]   Number of children: 2   Years of education: Not on file   Highest education level:  Not on file  Occupational History   Occupation: part Scientist, clinical (histocompatibility and immunogenetics): [employer]  Tobacco Use   Smoking status: Former    Packs/day: 1.00    Years: 30.00    Pack years: 30.00    Types: Cigarettes, Cigars    Quit date: 05/13/1998    Years since quitting: 23.0   Smokeless tobacco: Current    Types: Chew  Vaping Use   Vaping Use: Never used  Substance and Sexual Activity   Alcohol use: Yes    Alcohol/week: 3.0 standard drinks    Types: 3 Cans of beer per week   Drug use: No   Sexual activity: Not Currently  Other Topics Concern   Not on file  Social History Narrative   Married x 32 years   2 grandsons   Social Determinants of Radio broadcast assistant Strain: Low Risk    Difficulty of Paying Living Expenses: Not hard at all  Food Insecurity: No Food Insecurity   Worried About Charity fundraiser in the Last Year: Never true   Arboriculturist in the Last Year: Never true  Transportation Needs: No Transportation Needs   Lack of Transportation (Medical): No   Lack of Transportation (Non-Medical): No  Physical Activity: Sufficiently Active   Days of Exercise per Week: 5 days   Minutes of Exercise per Session: 30 min  Stress: No Stress Concern Present   Feeling of Stress : Not at all  Social Connections: Socially Integrated   Frequency of Communication with Friends and Family: Three times a week   Frequency of Social Gatherings with Friends and Family: Three times a week   Attends Religious Services: More than 4 times per year   Active Member of Clubs or Organizations: Yes   Attends Music therapist: More than 4 times per year   Marital Status: Married     Family History: The patient's family history includes Diabetes in his daughter; Heart disease in his mother; Lung cancer in his father.  ROS:   Please see the history of present illness.     All other systems reviewed and are negative.  EKGs/Labs/Other Studies Reviewed:    The following studies  were reviewed today:   EKG:  EKG is ordered today.  EKG shows normal sinus rhythm, normal ECG   Recent Labs: 09/12/2020: ALT 11 11/27/2020: BUN 18; Creatinine, Ser 1.08; Hemoglobin 12.6; Platelets 328; Potassium 5.0; Sodium 140  Recent Lipid Panel    Component Value Date/Time   CHOL 93 06/06/2021 0704   TRIG 70 06/06/2021 0704   HDL 36 (L) 06/06/2021 0704   CHOLHDL 2.6 06/06/2021 0704   VLDL 14 06/06/2021 0704   LDLCALC 43 06/06/2021 0704   LDLCALC 86 09/12/2020 1611  Risk Assessment/Calculations:          Physical Exam:    VS:  BP 110/62 (BP Location: Left Arm, Patient Position: Sitting, Cuff Size: Large)   Pulse 77   Ht 6' (1.829 m)   Wt 213 lb (96.6 kg)   SpO2 97%   BMI 28.89 kg/m     Wt Readings from Last 3 Encounters:  06/11/21 213 lb (96.6 kg)  04/25/21 212 lb 6.4 oz (96.3 kg)  04/13/21 220 lb 3.2 oz (99.9 kg)     GEN:  Well nourished, well developed in no acute distress HEENT: Normal NECK: No JVD; No carotid bruits CARDIAC: RRR, no murmurs, rubs, gallops RESPIRATORY: Breath sounds appear diminished at bases, clear anteriorly ABDOMEN: Soft, non-tender, non-distended MUSCULOSKELETAL:  No edema; No deformity  SKIN: Warm and dry NEUROLOGIC:  Alert and oriented x 3 PSYCHIATRIC:  Normal affect   ASSESSMENT:    1. Pre-operative cardiovascular examination   2. Coronary artery disease involving native coronary artery of native heart, unspecified whether angina present   3. Pure hypercholesterolemia   4. Primary hypertension     PLAN:    In order of problems listed above:  Preop evaluation prior to left knee surgery.  Last echo with normal EF, left heart cath with no obstructive disease, patent stents.  Angina adequately controlled.  Okay to proceed with surgical procedure from a cardiac perspective, okay to hold antiplatelets prior to surgery, restart as soon as safely possible after procedure. CAD, s/p DES to proximal LAD 09/2020.  Repeat LHC 11/2020  patent mid LAD stent, 75% proximal RCA, nondominant vessel.  echo with preserved EF 60 to 65% impaired relaxation.  Due to easy bruisability, stop Brilinta, start Plavix.  Continue aspirin, Crestor, Zetia, Toprol-XL, Imdur 15 mg daily. Hyperlipidemia, LDL at goal, continue Crestor, Zetia. Hypertension, BP controlled.  Slight dizziness with standing.  Reduce losartan to 25 mg daily, continue Toprol-XL, Imdur.   Follow-up in 6 months.   Medication Adjustments/Labs and Tests Ordered: Current medicines are reviewed at length with the patient today.  Concerns regarding medicines are outlined above.  Orders Placed This Encounter  Procedures   EKG 12-Lead       Meds ordered this encounter  Medications   clopidogrel (PLAVIX) 75 MG tablet    Sig: Take 1 tablet (75 mg total) by mouth daily.    Dispense:  30 tablet    Refill:  3   losartan (COZAAR) 50 MG tablet    Sig: Take 0.5 tablets (25 mg total) by mouth in the morning.    Dispense:  45 tablet    Refill:  1    No need to refill at this time. Patient has some on hand.     Signed, Kate Sable, MD  06/11/2021 5:21 PM    [place]

## 2021-06-11 NOTE — Patient Instructions (Signed)
Medication Instructions:   Your physician has recommended you make the following change in your medication:    STOP taking your Brillinta.  2.    START taking Plavix 75 MG once a day.  3.    DECREASE your Losartan to 25 MG once a day (Half of your current pill)   *If you need a refill on your cardiac medications before your next appointment, please call your pharmacy*   Lab Work:  None ordered   Testing/Procedures:  None ordered   Follow-Up: At [facility], you and your health needs are our priority.  As part of our continuing mission to provide you with exceptional heart care, we have created designated Provider Care Teams.  These Care Teams include your primary Cardiologist (physician) and Advanced Practice Providers (APPs -  Physician Assistants and Nurse Practitioners) who all work together to provide you with the care you need, when you need it.  We recommend signing up for the patient portal called "MyChart".  Sign up information is provided on this After Visit Summary.  MyChart is used to connect with patients for Virtual Visits (Telemedicine).  Patients are able to view lab/test results, encounter notes, upcoming appointments, etc.  Non-urgent messages can be sent to your provider as well.   To learn more about what you can do with MyChart, go to [email or web address].    Your next appointment:   6 month(s)  The format for your next appointment:   In Person  Provider:   Kate Sable, MD    Other Instructions   Important Information About Sugar

## 2021-06-21 DIAGNOSIS — M1712 Unilateral primary osteoarthritis, left knee: Secondary | ICD-10-CM | POA: Diagnosis not present

## 2021-06-28 DIAGNOSIS — M1712 Unilateral primary osteoarthritis, left knee: Secondary | ICD-10-CM | POA: Diagnosis not present

## 2021-07-02 ENCOUNTER — Other Ambulatory Visit: Payer: Self-pay

## 2021-07-02 MED ORDER — PANTOPRAZOLE SODIUM 40 MG PO TBEC: 40.0000 mg | DELAYED_RELEASE_TABLET | Freq: Every day | ORAL | 4 refills | Status: DC

## 2021-07-19 ENCOUNTER — Ambulatory Visit (INDEPENDENT_AMBULATORY_CARE_PROVIDER_SITE_OTHER): Payer: PPO | Admitting: Family Medicine

## 2021-07-19 VITALS — BP 115/60 | HR 73 | Temp 97.9°F | Ht 73.0 in | Wt 224.0 lb

## 2021-07-19 DIAGNOSIS — R5383 Other fatigue: Secondary | ICD-10-CM

## 2021-07-19 DIAGNOSIS — M79672 Pain in left foot: Secondary | ICD-10-CM

## 2021-07-19 NOTE — Progress Notes (Signed)
Subjective:    Patient ID: [name], male    DOB: July 07, 1950, 71 y.o.   MRN: [id number]  Patient is a very pleasant 71 year old Caucasian gentleman who presents today with 3 concerns.  First he reports pain in the midfoot of his left foot.  It hurts in his arms.  He also has some pain radiating into the dorsum of his left ankle.  The pain is more of a soreness that began gradually.  It worsened recently after he got an injection in his knee from his orthopedist.  However he believes that he has been walking awkwardly trying to protect his knee and this may have aggravated his foot.  There is no bruising.  There is no erythema.  There is no palpable swelling.  There is no warmth.  The patient is able to bear weight on the foot.  Is more sore in the arch of the foot than anywhere else.  He also reports fatigue.  He states that he just does not have any energy.  He denies any chest pain or shortness of breath.  He denies any hypersomnolence.  He also recently had sharp electrical-like pain that radiated up his right arm and into his right axilla.  This occurred suddenly while sitting at a desk.  He denies any cough or pleurisy or hemoptysis.  The pain lasted a split second and then went away.  He has not had any pain since. Past Medical History:  Diagnosis Date   Adenomatous colon polyp 2004   Allergy    Diverticulosis 2011   Former smoker    HLD (hyperlipidemia)    Hyperplastic colon polyp 2004   Hypertension    Melanoma in situ [facility])    Past Surgical History:  Procedure Laterality Date   COLONOSCOPY  01/21/2009   SEVERAL    CORONARY STENT INTERVENTION N/A 10/04/2020   Procedure: CORONARY STENT INTERVENTION;  Surgeon: Leonie Man, MD;  Location: [place] [facility];  Service: Cardiovascular;  Laterality: N/A;   LEFT HEART CATH AND CORONARY ANGIOGRAPHY N/A 10/04/2020   Procedure: LEFT HEART CATH AND CORONARY ANGIOGRAPHY;  Surgeon: Leonie Man, MD;  Location: [place] [facility];   Service: Cardiovascular;  Laterality: N/A;   LEFT HEART CATH AND CORONARY ANGIOGRAPHY Left 11/30/2020   Procedure: LEFT HEART CATH AND CORONARY ANGIOGRAPHY;  Surgeon: Leonie Man, MD;  Location: [place] [facility];  Service: Cardiovascular;  Laterality: Left;   TOOTH EXTRACTION     TOTAL KNEE ARTHROPLASTY Right 04/26/2020   Current Outpatient Medications on File Prior to Visit  Medication Sig Dispense Refill   aspirin EC 81 MG tablet Take 81 mg by mouth daily. Swallow whole.     cetirizine (ZYRTEC) 10 MG tablet TAKE 1 TABLET BY MOUTH EVERY DAY 90 tablet 3   clopidogrel (PLAVIX) 75 MG tablet Take 1 tablet (75 mg total) by mouth daily. 30 tablet 3   ezetimibe (ZETIA) 10 MG tablet TAKE 1 TABLET BY MOUTH EVERY DAY 90 tablet 3   isosorbide mononitrate (IMDUR) 30 MG 24 hr tablet Take 0.5 tablets (15 mg total) by mouth daily. 45 tablet 2   losartan (COZAAR) 50 MG tablet Take 0.5 tablets (25 mg total) by mouth in the morning. 45 tablet 1   metoprolol succinate (TOPROL-XL) 25 MG 24 hr tablet Take 25 mg by mouth daily.     nitroGLYCERIN (NITROSTAT) 0.4 MG SL tablet Place 1 tablet (0.4 mg total) under the tongue every 5 (five) minutes  as needed for chest pain. 50 tablet 3   pantoprazole (PROTONIX) 40 MG tablet Take 1 tablet (40 mg total) by mouth daily. 30 tablet 4   rosuvastatin (CRESTOR) 40 MG tablet Take 1 tablet (40 mg total) by mouth daily. 30 tablet 2   tiZANidine (ZANAFLEX) 4 MG tablet Take 1 tablet (4 mg total) by mouth every 6 (six) hours as needed for muscle spasms. 30 tablet 0   No current facility-administered medications on file prior to visit.   Allergies  Allergen Reactions   Levaquin [Levofloxacin In D5w] Shortness Of Breath   Social History   Socioeconomic History   Marital status: Married    Spouse name: [name]   Number of children: 2   Years of education: Not on file   Highest education level: Not on file  Occupational History   Occupation: part Scientist, clinical (histocompatibility and immunogenetics): [employer]  Tobacco Use   Smoking status: Former    Packs/day: 1.00    Years: 30.00    Total pack years: 30.00    Types: Cigarettes, Cigars    Quit date: 05/13/1998    Years since quitting: 23.2   Smokeless tobacco: Current    Types: Chew  Vaping Use   Vaping Use: Never used  Substance and Sexual Activity   Alcohol use: Yes    Alcohol/week: 3.0 standard drinks of alcohol    Types: 3 Cans of beer per week   Drug use: No   Sexual activity: Not Currently  Other Topics Concern   Not on file  Social History Narrative   Married x 51 years   2 grandsons   Social Determinants of Health   Financial Resource Strain: Low Risk  (11/09/2020)   Overall Financial Resource Strain (CARDIA)    Difficulty of Paying Living Expenses: Not hard at all  Food Insecurity: No Food Insecurity (11/09/2020)   Hunger Vital Sign    Worried About Running Out of Food in the Last Year: Never true    Ran Out of Food in the Last Year: Never true  Transportation Needs: No Transportation Needs (11/09/2020)   PRAPARE - Hydrologist (Medical): No    Lack of Transportation (Non-Medical): No  Physical Activity: Sufficiently Active (11/09/2020)   Exercise Vital Sign    Days of Exercise per Week: 5 days    Minutes of Exercise per Session: 30 min  Stress: No Stress Concern Present (11/09/2020)   [place]    Feeling of Stress : Not at all  Social Connections: [place] (11/09/2020)   Social Connection and Isolation Panel [NHANES]    Frequency of Communication with Friends and Family: Three times a week    Frequency of Social Gatherings with Friends and Family: Three times a week    Attends Religious Services: More than 4 times per year    Active Member of Clubs or Organizations: Yes    Attends Archivist Meetings: More than 4 times per year    Marital Status: Married  Human resources officer Violence: Not At  Risk (11/09/2020)   Humiliation, Afraid, Rape, and Kick questionnaire    Fear of Current or Ex-Partner: No    Emotionally Abused: No    Physically Abused: No    Sexually Abused: No      Review of Systems  All other systems reviewed and are negative.      Objective:   Physical Exam Vitals reviewed.  Constitutional:      General: He is not in acute distress.    Appearance: He is well-developed. He is not diaphoretic.  HENT:     Nose: Congestion and rhinorrhea present. No mucosal edema.     Right Sinus: Frontal sinus tenderness present. No maxillary sinus tenderness.     Left Sinus: Frontal sinus tenderness present. No maxillary sinus tenderness.     Mouth/Throat:     Pharynx: No oropharyngeal exudate.  Eyes:     Conjunctiva/sclera: Conjunctivae normal.     Pupils: Pupils are equal, round, and reactive to light.  Cardiovascular:     Rate and Rhythm: Normal rate and regular rhythm.     Pulses:          Dorsalis pedis pulses are 2+ on the left side.       Posterior tibial pulses are 2+ on the left side.     Heart sounds: Normal heart sounds.  Pulmonary:     Effort: Pulmonary effort is normal. No respiratory distress.     Breath sounds: Normal breath sounds. No wheezing or rales.  Musculoskeletal:     Cervical back: Neck supple.     Left foot: Normal range of motion. No deformity.       Feet:  Feet:     Left foot:     Skin integrity: No ulcer, blister, skin breakdown, erythema, warmth, callus or dry skin.  Lymphadenopathy:     Cervical: No cervical adenopathy.  Skin:    Findings: No erythema or rash.           Assessment & Plan:  Left foot pain - Plan: DG Foot Complete Left  Fatigue, unspecified type - Plan: CBC with Differential/Platelet, Vitamin B12, COMPLETE METABOLIC PANEL WITH GFR, TSH, Testosterone Total,Free,Bio, Males Given the fatigue, we will check a TSH, B12, testosterone level, CBC, and CMP.  If lab work is normal, could be fatigue related to his  beta-blocker.  I believe the pain in his left foot is likely due to osteoarthritis or irritation due to compensation for his recent left knee pain.  Obtain an x-ray of the foot to evaluate further.  I believe the sharp electrical pain that radiate up his right arm into his axilla sound like nerve pain.  This was instantaneous and resolved.  Therefore no further work-up unless recurrent and persistent.  Specifically he denies any symptoms that make me concerned about a pulmonary embolism or pneumonia

## 2021-07-20 ENCOUNTER — Telehealth: Payer: Self-pay

## 2021-07-20 ENCOUNTER — Ambulatory Visit
Admission: RE | Admit: 2021-07-20 | Discharge: 2021-07-20 | Disposition: A | Discharge status: Home / Self Care | Payer: PPO | Source: Ambulatory Visit | Attending: Family Medicine | Admitting: Family Medicine

## 2021-07-20 ENCOUNTER — Other Ambulatory Visit: Payer: Self-pay

## 2021-07-20 DIAGNOSIS — R600 Localized edema: Secondary | ICD-10-CM | POA: Diagnosis not present

## 2021-07-20 DIAGNOSIS — M79672 Pain in left foot: Secondary | ICD-10-CM

## 2021-07-20 LAB — COMPLETE METABOLIC PANEL WITH GFR
AG Ratio: 1.9 (calc) (ref 1.0–2.5)
ALT: 18 U/L (ref 9–46)
AST: 17 U/L (ref 10–35)
Albumin: 4 g/dL (ref 3.6–5.1)
Alkaline phosphatase (APISO): 67 U/L (ref 35–144)
BUN/Creatinine Ratio: 14 (calc) (ref 6–22)
BUN: 19 mg/dL (ref 7–25)
CO2: 28 mmol/L (ref 20–32)
Calcium: 9.3 mg/dL (ref 8.6–10.3)
Chloride: 106 mmol/L (ref 98–110)
Creat: 1.36 mg/dL — ABNORMAL HIGH (ref 0.70–1.28)
Globulin: 2.1 g/dL (calc) (ref 1.9–3.7)
Glucose, Bld: 80 mg/dL (ref 65–99)
Potassium: 4.5 mmol/L (ref 3.5–5.3)
Sodium: 141 mmol/L (ref 135–146)
Total Bilirubin: 0.5 mg/dL (ref 0.2–1.2)
Total Protein: 6.1 g/dL (ref 6.1–8.1)
eGFR: 55 mL/min/{1.73_m2} — ABNORMAL LOW (ref >=60)

## 2021-07-20 LAB — CBC WITH DIFFERENTIAL/PLATELET
Absolute Monocytes: 991 cells/uL — ABNORMAL HIGH (ref 200–950)
Basophils Absolute: 101 cells/uL (ref 0–200)
Basophils Relative: 1.2 %
Eosinophils Absolute: 655 cells/uL — ABNORMAL HIGH (ref 15–500)
Eosinophils Relative: 7.8 %
HCT: 40.5 % (ref 38.5–50.0)
Hemoglobin: 13.4 g/dL (ref 13.2–17.1)
Lymphs Abs: 2696 cells/uL (ref 850–3900)
MCH: 28.2 pg (ref 27.0–33.0)
MCHC: 33.1 g/dL (ref 32.0–36.0)
MCV: 85.3 fL (ref 80.0–100.0)
MPV: 10.8 fL (ref 7.5–12.5)
Monocytes Relative: 11.8 %
Neutro Abs: 3956 cells/uL (ref 1500–7800)
Neutrophils Relative %: 47.1 %
Platelets: 291 10*3/uL (ref 140–400)
RBC: 4.75 10*6/uL (ref 4.20–5.80)
RDW: 13.2 % (ref 11.0–15.0)
Total Lymphocyte: 32.1 %
WBC: 8.4 10*3/uL (ref 3.8–10.8)

## 2021-07-20 LAB — TSH: TSH: 6.14 mIU/L — ABNORMAL HIGH (ref 0.40–4.50)

## 2021-07-20 LAB — TESTOSTERONE TOTAL,FREE,BIO, MALES
Albumin: 4 g/dL (ref 3.6–5.1)
Sex Hormone Binding: 27 nmol/L (ref 22–77)
Testosterone: 237 ng/dL — ABNORMAL LOW (ref 250–827)

## 2021-07-20 LAB — VITAMIN B12: Vitamin B-12: 444 pg/mL (ref 200–1100)

## 2021-07-20 NOTE — Telephone Encounter (Signed)
Call asking for orders for L-ankle pain per pt that is mostly where the pain is. Per Tillie Rung, pt do not want xray of the L-foot. Dr. Dennard Schaumann aware.

## 2021-07-27 NOTE — Progress Notes (Signed)
Chronic Care Management Pharmacy Note Summary: PharmD FU visit.  Patient renal function declining recently.  Has ultrasound tomorrow.  Otherwise labs look good.   Recommendations: Monitor TSH, GFR - if continues to decline may need to renal adjust Crestor FU: 6 months 3 months CMA FU   08/09/2021 Name:  [name] MRN:  [id number] DOB:  09-25-1950  Subjective: [name] is an 71 y.o. year old male who is a primary patient of Pickard, Cammie Mcgee, MD.  The CCM team was consulted for assistance with disease management and care coordination needs.    Engaged with patient by telephone for follow up visit in response to provider referral for pharmacy case management and/or care coordination services.   Consent to Services:  The patient was given the following information about Chronic Care Management services today, agreed to services, and gave verbal consent: 1. CCM service includes personalized support from designated clinical staff supervised by the primary care provider, including individualized plan of care and coordination with other care providers 2. 24/7 contact phone numbers for assistance for urgent and routine care needs. 3. Service will only be billed when office clinical staff spend 20 minutes or more in a month to coordinate care. 4. Only one practitioner may furnish and bill the service in a calendar month. 5.The patient may stop CCM services at any time (effective at the end of the month) by phone call to the office staff. 6. The patient will be responsible for cost sharing (co-pay) of up to 20% of the service fee (after annual deductible is met). Patient agreed to services and consent obtained.  Patient Care Team: Susy Frizzle, MD as PCP - General (Family Medicine) Kate Sable, MD as PCP - Cardiology (Cardiology) Edythe Clarity, [facility] as Pharmacist (Pharmacist)  Recent office visits:  03/05/21 [name], [place] test performed.  azithromycin (ZITHROMAX) 250 MG tablet prescribed. Follow up in 3-4 days if no improvement.      Recent consult visits:  None noted.      Hospital visits: 11/30/20 Medication Reconciliation was completed by comparing discharge summary, patient's EMR and Pharmacy list, and upon discussion with patient.  Objective:  Lab Results  Component Value Date   CREATININE 1.56 (H) 08/06/2021   BUN 20 08/06/2021   GFRNONAA 53 (L) 04/03/2020   GFRAA 62 04/03/2020   NA 138 08/06/2021   K 4.8 08/06/2021   CALCIUM 9.3 08/06/2021   CO2 24 08/06/2021   GLUCOSE 104 (H) 08/06/2021    No results found for: "HGBA1C", "FRUCTOSAMINE", "GFR", "MICROALBUR"  Last diabetic Eye exam: No results found for: "HMDIABEYEEXA"  Last diabetic Foot exam: No results found for: "HMDIABFOOTEX"   Lab Results  Component Value Date   CHOL 93 06/06/2021   HDL 36 (L) 06/06/2021   LDLCALC 43 06/06/2021   TRIG 70 06/06/2021   CHOLHDL 2.6 06/06/2021       Latest Ref Rng & Units 07/19/2021    4:42 PM 09/12/2020    4:11 PM 04/03/2020    4:32 PM  Hepatic Function  Total Protein 6.1 - 8.1 g/dL 6.1  6.4  6.6   AST 10 - 35 U/L $Remo'17  12  24   'WxnMM$ ALT 9 - 46 U/L $Remo'18  11  25   'tKUOF$ Total Bilirubin 0.2 - 1.2 mg/dL 0.5  0.7  0.7     Lab Results  Component Value Date/Time   TSH 4.77 (H) 08/06/2021 04:48 PM   TSH  6.14 (H) 07/19/2021 04:42 PM   FREET4 1.0 08/06/2021 04:48 PM       Latest Ref Rng & Units 07/19/2021    4:42 PM 11/27/2020    2:40 PM 10/18/2020    8:49 AM  CBC  WBC 3.8 - 10.8 Thousand/uL 8.4  16.4  10.2   Hemoglobin 13.2 - 17.1 g/dL 13.4  12.6  14.0   Hematocrit 38.5 - 50.0 % 40.5  39.1  41.8   Platelets 140 - 400 Thousand/uL 291  328  309     No results found for: "VD25OH"  Clinical ASCVD: No  The ASCVD Risk score (Arnett DK, et al., 2019) failed to calculate for the following reasons:   The valid total cholesterol range is 130 to 320 mg/dL       11/09/2020    2:00 PM 11/16/2019    2:46 PM 11/27/2017     3:26 PM  Depression screen PHQ 2/9  Decreased Interest 0 0 0  Down, Depressed, Hopeless 0 0 0  PHQ - 2 Score 0 0 0      Social History   Tobacco Use  Smoking Status Former   Packs/day: 1.00   Years: 30.00   Total pack years: 30.00   Types: Cigarettes, Cigars   Quit date: 05/13/1998   Years since quitting: 23.2  Smokeless Tobacco Current   Types: Chew   BP Readings from Last 3 Encounters:  08/06/21 136/72  07/19/21 115/60  06/11/21 110/62   Pulse Readings from Last 3 Encounters:  08/06/21 (!) 56  07/19/21 73  06/11/21 77   Wt Readings from Last 3 Encounters:  08/06/21 220 lb 6.4 oz (100 kg)  07/19/21 224 lb (101.6 kg)  06/11/21 213 lb (96.6 kg)   BMI Readings from Last 3 Encounters:  08/06/21 29.89 kg/m  07/19/21 29.55 kg/m  06/11/21 28.89 kg/m    Assessment/Interventions: Review of patient past medical history, allergies, medications, health status, including review of consultants reports, laboratory and other test data, was performed as part of comprehensive evaluation and provision of chronic care management services.   SDOH:  (Social Determinants of Health) assessments and interventions performed: No, assessed with the last year   Financial Resource Strain: Low Risk  (11/09/2020)   Overall Financial Resource Strain (CARDIA)    Difficulty of Paying Living Expenses: Not hard at all    SDOH Screenings   Alcohol Screen: Low Risk  (11/09/2020)   Alcohol Screen    Last Alcohol Screening Score (AUDIT): 1  Depression (PHQ2-9): Low Risk  (11/09/2020)   Depression (PHQ2-9)    PHQ-2 Score: 0  Financial Resource Strain: Low Risk  (11/09/2020)   Overall Financial Resource Strain (CARDIA)    Difficulty of Paying Living Expenses: Not hard at all  Food Insecurity: No Food Insecurity (11/09/2020)   Hunger Vital Sign    Worried About Running Out of Food in the Last Year: Never true    Ran Out of Food in the Last Year: Never true  Housing: Low Risk  (11/09/2020)    Housing    Last Housing Risk Score: 0  Physical Activity: Sufficiently Active (11/09/2020)   Exercise Vital Sign    Days of Exercise per Week: 5 days    Minutes of Exercise per Session: 30 min  Social Connections: Socially Integrated (11/09/2020)   Social Connection and Isolation Panel [NHANES]    Frequency of Communication with Friends and Family: Three times a week    Frequency of Social Gatherings with Friends and  Family: Three times a week    Attends Religious Services: More than 4 times per year    Active Member of Clubs or Organizations: Yes    Attends Archivist Meetings: More than 4 times per year    Marital Status: Married  Stress: No Stress Concern Present (11/09/2020)   [place]    Feeling of Stress : Not at all  Tobacco Use: High Risk (06/11/2021)   Patient History    Smoking Tobacco Use: Former    Smokeless Tobacco Use: Current    Passive Exposure: Not on file  Transportation Needs: No Transportation Needs (11/09/2020)   PRAPARE - Hydrologist (Medical): No    Lack of Transportation (Non-Medical): No    CCM Care Plan  Allergies  Allergen Reactions   Levaquin [Levofloxacin In D5w] Shortness Of Breath    Medications Reviewed Today     Reviewed by Edythe Clarity, [facility] (Pharmacist) on 08/09/21 at 1123  Med List Status: <None>   Medication Order Taking? Sig Documenting Provider Last Dose Status Informant  aspirin EC 81 MG tablet [id number] Yes Take 81 mg by mouth daily. Swallow whole. [provider] Taking Active Self  cetirizine (ZYRTEC) 10 MG tablet [id number] Yes TAKE 1 TABLET BY MOUTH EVERY DAY Susy Frizzle, MD Taking Active   clopidogrel (PLAVIX) 75 MG tablet [id number] Yes Take 1 tablet (75 mg total) by mouth daily. Kate Sable, MD Taking Active   ezetimibe (ZETIA) 10 MG tablet [id number] Yes TAKE 1 TABLET BY MOUTH EVERY DAY Susy Frizzle, MD Taking Active   isosorbide mononitrate (IMDUR) 30 MG 24 hr tablet [id number] Yes Take 0.5 tablets (15 mg total) by mouth daily. Kate Sable, MD Taking Active   losartan (COZAAR) 50 MG tablet [id number] Yes Take 0.5 tablets (25 mg total) by mouth in the morning. Kate Sable, MD Taking Active   metoprolol succinate (TOPROL-XL) 25 MG 24 hr tablet [id number] Yes Take 25 mg by mouth daily. [provider] Taking Active   nitroGLYCERIN (NITROSTAT) 0.4 MG SL tablet [id number] Yes Place 1 tablet (0.4 mg total) under the tongue every 5 (five) minutes as needed for chest pain. Susy Frizzle, MD Taking Active Self  pantoprazole (PROTONIX) 40 MG tablet [id number] Yes Take 1 tablet (40 mg total) by mouth daily. Kate Sable, MD Taking Active   rosuvastatin (CRESTOR) 40 MG tablet [id number] Yes Take 1 tablet (40 mg total) by mouth daily. Kate Sable, MD Taking Active   tiZANidine (ZANAFLEX) 4 MG tablet [id number] Yes Take 1 tablet (4 mg total) by mouth every 6 (six) hours as needed for muscle spasms. Susy Frizzle, MD Taking Active             Patient Active Problem List   Diagnosis Date Noted   COPD (chronic obstructive pulmonary disease) ([place]) 01/30/2021   Daytime sleepiness 01/30/2021   Angina, class III ([place]) 10/04/2020   Abnormal cardiac CT angiography 10/04/2020   Coronary artery disease involving native coronary artery of native heart with angina pectoris ([place])    Left hamstring injury 09/13/2019   Low back pain radiating to right leg 06/15/2019   Posterior tibial tendinitis, left 12/04/2018   Bilateral leg pain 05/21/2017   Degenerative arthritis of knee, bilateral 02/18/2017   Bilateral knee pain 07/18/2016   Right foot pain 04/16/2016   LLQ abdominal pain 06/10/2013   Melanoma in situ ([place])    Shortness of  breath 05/25/2012   Allergic rhinitis 05/25/2012   Diverticulosis    Former smoker    HLD (hyperlipidemia)    Allergy    ABDOMINAL  PAIN, LEFT LOWER QUADRANT 10/23/2009   GERD 03/31/2007   DIVERTICULITIS, ACUTE 03/31/2007   SINUSITIS- ACUTE-NOS 02/18/2007   URI 02/18/2007   ERECTILE DYSFUNCTION 10/29/2006   TOBACCO ABUSE 10/29/2006   Hyperlipidemia with target LDL less than 70 10/01/2006   Essential hypertension 10/01/2006   DIVERTICULOSIS, COLON 10/01/2006    Immunization History  Administered Date(s) Administered   Influenza,inj,Quad PF,6+ Mos 12/16/2012, 11/03/2014   Influenza-Unspecified 10/21/2016, 10/11/2020   Pneumococcal Conjugate-13 07/01/2014   Pneumococcal Polysaccharide-23 06/19/2012, 11/30/2019   Td 06/12/2001   Tdap 05/18/2013    Conditions to be addressed/monitored:  Hypertension and Hyperlipidemia  Care Plan : General Pharmacy (Adult)  Updates made by Edythe Clarity, RPH since 08/09/2021 12:00 AM     Problem: HTN, HLD   Priority: High  Onset Date: 06/08/2020     Long-Range Goal: Patient-Specific Goal   Start Date: 06/07/2020  Expected End Date: 12/09/2020  Recent Progress: On track  Priority: High  Note:   Current Barriers:  Declining renal function  Pharmacist Clinical Goal(s):  Patient will achieve adherence to monitoring guidelines and medication adherence to achieve therapeutic efficacy adhere to prescribed medication regimen as evidenced by fill dates contact provider office for questions/concerns as evidenced notation of same in electronic health record through collaboration with PharmD and provider.   Interventions: 1:1 collaboration with Susy Frizzle, MD regarding development and update of comprehensive plan of care as evidenced by provider attestation and co-signature Inter-disciplinary care team collaboration (see longitudinal plan of care) Comprehensive medication review performed; medication list updated in electronic medical record  Hypertension (BP goal <140/90) 08/09/21 -Controlled -Current treatment: Losartan 50mg  Appropriate, Effective, Safe,  Accessible Metoprolol XL 50mg  Appropriate, Effective, Safe, Accessible -Medications previously tried: Metoprolol XL, Imdur (headaches) -Current home readings: BP slightly on the higher side per his report, no logs discussed -Denies hypotensive/hypertensive symptoms -Educated on BP goals and benefits of medications for prevention of heart attack, stroke and kidney damage; Exercise goal of 150 minutes per week; Importance of home blood pressure monitoring; Symptoms of hypotension and importance of maintaining adequate hydration; -Counseled to monitor BP at home daily, document, and provide log at future appointments -Denies any dizziness at this time.  His BP was controlled at goal during last OV. -Continue to follow and adjust as needed, no changes at this time.  Update 12/28/20 120-130/80s.  Not having anymore dizziness. He has stopped the Imdur completely due to severe headaches which was OK's by cardiology Reports feeling well overall, encouraged physical activity, etc. Continue current meds for now - continue to monitor BP at home. Report consistent readings > 130/80 to providers.  Hyperlipidemia/CAD: (LDL goal < 70) 08/09/21 Controlled, based on most recent LDL of 43 -Current treatment: Zetia 10mg  daily Appropriate, Effective, Safe, Accessible Rosuvastatin 40mg  Appropriate, Effective, Safe, Accessible -Medications previously tried: Atorvastatin (aches)  -Educated on Cholesterol goals;  Importance of limiting foods high in cholesterol; Exercise goal of 150 minutes per week;  -Recommended to continue current medication -LDL now at goal, he is tolerating Crestor and Zetia fine.  Only concern now is declining renal function.  He is going to have an ultrasound on kidney tomorrow. If kidney function continues to decline will need to adjust dose of Crestor to appropriate renal dose or switch to another statin safer. Continue to monitor - no changes currently.  Update 12/29/20 LDL  still above goal < 70 with CAD He cannot tolerate statins, adherent with Zetia currently Recommend continue to monitor. ASCVD risk is HIGH. Could possibly consider Repatha pending cost and patient willingness. Will address at next visit. No changes at this time.   Patient Goals/Self-Care Activities Patient will:  - take medications as prescribed focus on medication adherence by pill counts check blood pressure daily, document, and provide at future appointments target a minimum of 150 minutes of moderate intensity exercise weekly  Follow Up Plan: The care management team will reach out to the patient again over the next 120 days.              Medication Assistance: None required.  Patient affirms current coverage meets needs. Compliance/Adherence/Medication fill history: Care Gaps: Colonoscopy  Star-Rating Drugs: Rosuvastatin 40mg  07/23/20 90ds Losartan 50mg  06/11/21 90ds  Patient's preferred pharmacy is:  [id number] - , [place] - [address] [place] [address] Phone: [phone] Fax: [phone]  [id number] - [place], [place] - [address] [place] [address] Phone: [phone] Fax: [phone]  [facility] [address], [place] [address] [place] [address] Phone: [phone] Fax: [phone]  [facility] [address] [place] [address] Phone: [phone] Fax: [phone]   Uses pill box? No - just vials of meds Pt endorses 100% compliance  We discussed: Benefits of medication synchronization, packaging and delivery as well as enhanced pharmacist oversight with [employer]. Patient decided to: Continue current medication management strategy  Care Plan and Follow Up Patient Decision:  Patient agrees to Care Plan and Follow-up.  Plan: The care management team  will reach out to the patient again over the next 120 days.  Beverly Milch, PharmD Clinical Pharmacist [place] [phone]

## 2021-07-31 DIAGNOSIS — M1712 Unilateral primary osteoarthritis, left knee: Secondary | ICD-10-CM | POA: Diagnosis not present

## 2021-07-31 DIAGNOSIS — M76822 Posterior tibial tendinitis, left leg: Secondary | ICD-10-CM | POA: Diagnosis not present

## 2021-07-31 DIAGNOSIS — M25572 Pain in left ankle and joints of left foot: Secondary | ICD-10-CM | POA: Diagnosis not present

## 2021-08-06 ENCOUNTER — Ambulatory Visit (INDEPENDENT_AMBULATORY_CARE_PROVIDER_SITE_OTHER): Payer: PPO | Admitting: Family Medicine

## 2021-08-06 VITALS — BP 136/72 | HR 56 | Temp 97.8°F | Ht 72.0 in | Wt 220.4 lb

## 2021-08-06 DIAGNOSIS — E038 Other specified hypothyroidism: Secondary | ICD-10-CM | POA: Diagnosis not present

## 2021-08-06 DIAGNOSIS — E291 Testicular hypofunction: Secondary | ICD-10-CM

## 2021-08-06 NOTE — Progress Notes (Signed)
Subjective:    Patient ID: [name], male    DOB: Feb 20, 1950, 71 y.o.   MRN: [id number]  Recently saw the patient and he was complaining of fatigue.  At that time we did some basic lab work which showed a slight increase in his creatinine which I believe reflects dehydration.  He states that he has not been taking NSAIDs regularly.  If he takes anything for pain he is typically taking Tylenol.  However he admits that he does not drink enough water.  He is here today to recheck his renal function.  He also occasionally gets dizzy and lightheaded.  He denies any syncope or neurologic deficits.  However I think this also reflects periodic dehydration.  However his labs also showed a mildly elevated TSH of 6 suggesting subclinical hypothyroidism and a low testosterone level of 237.  He is here today to discuss this further. Past Medical History:  Diagnosis Date   Adenomatous colon polyp 2004   Allergy    Diverticulosis 2011   Former smoker    HLD (hyperlipidemia)    Hyperplastic colon polyp 2004   Hypertension    Melanoma in situ [facility])    Past Surgical History:  Procedure Laterality Date   COLONOSCOPY  01/21/2009   SEVERAL    CORONARY STENT INTERVENTION N/A 10/04/2020   Procedure: CORONARY STENT INTERVENTION;  Surgeon: Leonie Man, MD;  Location: [place] [facility];  Service: Cardiovascular;  Laterality: N/A;   LEFT HEART CATH AND CORONARY ANGIOGRAPHY N/A 10/04/2020   Procedure: LEFT HEART CATH AND CORONARY ANGIOGRAPHY;  Surgeon: Leonie Man, MD;  Location: [place] [facility];  Service: Cardiovascular;  Laterality: N/A;   LEFT HEART CATH AND CORONARY ANGIOGRAPHY Left 11/30/2020   Procedure: LEFT HEART CATH AND CORONARY ANGIOGRAPHY;  Surgeon: Leonie Man, MD;  Location: [place] [facility];  Service: Cardiovascular;  Laterality: Left;   TOOTH EXTRACTION     TOTAL KNEE ARTHROPLASTY Right 04/26/2020   Current Outpatient Medications on File Prior to Visit  Medication Sig  Dispense Refill   aspirin EC 81 MG tablet Take 81 mg by mouth daily. Swallow whole.     cetirizine (ZYRTEC) 10 MG tablet TAKE 1 TABLET BY MOUTH EVERY DAY 90 tablet 3   clopidogrel (PLAVIX) 75 MG tablet Take 1 tablet (75 mg total) by mouth daily. 30 tablet 3   ezetimibe (ZETIA) 10 MG tablet TAKE 1 TABLET BY MOUTH EVERY DAY 90 tablet 3   isosorbide mononitrate (IMDUR) 30 MG 24 hr tablet Take 0.5 tablets (15 mg total) by mouth daily. 45 tablet 2   losartan (COZAAR) 50 MG tablet Take 0.5 tablets (25 mg total) by mouth in the morning. 45 tablet 1   metoprolol succinate (TOPROL-XL) 25 MG 24 hr tablet Take 25 mg by mouth daily.     nitroGLYCERIN (NITROSTAT) 0.4 MG SL tablet Place 1 tablet (0.4 mg total) under the tongue every 5 (five) minutes as needed for chest pain. 50 tablet 3   pantoprazole (PROTONIX) 40 MG tablet Take 1 tablet (40 mg total) by mouth daily. 30 tablet 4   rosuvastatin (CRESTOR) 40 MG tablet Take 1 tablet (40 mg total) by mouth daily. 30 tablet 2   tiZANidine (ZANAFLEX) 4 MG tablet Take 1 tablet (4 mg total) by mouth every 6 (six) hours as needed for muscle spasms. 30 tablet 0   No current facility-administered medications on file prior to visit.   Allergies  Allergen Reactions   Levaquin [  Levofloxacin In D5w] Shortness Of Breath   Social History   Socioeconomic History   Marital status: Married    Spouse name: [name]   Number of children: 2   Years of education: Not on file   Highest education level: Not on file  Occupational History   Occupation: part Scientist, clinical (histocompatibility and immunogenetics): [employer]  Tobacco Use   Smoking status: Former    Packs/day: 1.00    Years: 30.00    Total pack years: 30.00    Types: Cigarettes, Cigars    Quit date: 05/13/1998    Years since quitting: 23.2   Smokeless tobacco: Current    Types: Chew  Vaping Use   Vaping Use: Never used  Substance and Sexual Activity   Alcohol use: Yes    Alcohol/week: 3.0 standard drinks of alcohol    Types: 3 Cans of beer  per week   Drug use: No   Sexual activity: Not Currently  Other Topics Concern   Not on file  Social History Narrative   Married x 51 years   2 grandsons   Social Determinants of Health   Financial Resource Strain: Low Risk  (11/09/2020)   Overall Financial Resource Strain (CARDIA)    Difficulty of Paying Living Expenses: Not hard at all  Food Insecurity: No Food Insecurity (11/09/2020)   Hunger Vital Sign    Worried About Running Out of Food in the Last Year: Never true    Ran Out of Food in the Last Year: Never true  Transportation Needs: No Transportation Needs (11/09/2020)   PRAPARE - Hydrologist (Medical): No    Lack of Transportation (Non-Medical): No  Physical Activity: Sufficiently Active (11/09/2020)   Exercise Vital Sign    Days of Exercise per Week: 5 days    Minutes of Exercise per Session: 30 min  Stress: No Stress Concern Present (11/09/2020)   [place]    Feeling of Stress : Not at all  Social Connections: [place] (11/09/2020)   Social Connection and Isolation Panel [NHANES]    Frequency of Communication with Friends and Family: Three times a week    Frequency of Social Gatherings with Friends and Family: Three times a week    Attends Religious Services: More than 4 times per year    Active Member of Clubs or Organizations: Yes    Attends Archivist Meetings: More than 4 times per year    Marital Status: Married  Human resources officer Violence: Not At Risk (11/09/2020)   Humiliation, Afraid, Rape, and Kick questionnaire    Fear of Current or Ex-Partner: No    Emotionally Abused: No    Physically Abused: No    Sexually Abused: No      Review of Systems  All other systems reviewed and are negative.      Objective:   Physical Exam Vitals reviewed.  Constitutional:      General: He is not in acute distress.    Appearance: He is  well-developed. He is not diaphoretic.  HENT:     Nose: Congestion and rhinorrhea present. No mucosal edema.     Right Sinus: Frontal sinus tenderness present. No maxillary sinus tenderness.     Left Sinus: Frontal sinus tenderness present. No maxillary sinus tenderness.     Mouth/Throat:     Pharynx: No oropharyngeal exudate.  Eyes:     Conjunctiva/sclera: Conjunctivae normal.     Pupils:  Pupils are equal, round, and reactive to light.  Cardiovascular:     Rate and Rhythm: Normal rate and regular rhythm.     Heart sounds: Normal heart sounds.  Pulmonary:     Effort: Pulmonary effort is normal. No respiratory distress.     Breath sounds: Normal breath sounds. No wheezing or rales.  Musculoskeletal:     Cervical back: Neck supple.  Lymphadenopathy:     Cervical: No cervical adenopathy.  Skin:    Findings: No erythema or rash.           Assessment & Plan:  Hypogonadism in male - Plan: T4, free, TSH, T3, Free, Testosterone Total,Free,Bio, Males, BASIC METABOLIC PANEL WITH GFR  Subclinical hypothyroidism - Plan: T4, free, TSH, T3, Free, Testosterone Total,Free,Bio, Males, BASIC METABOLIC PANEL WITH GFR I believe his fatigue is likely multifactorial.  I believe he likely has hypogonadism.  I will repeat a testosterone level on the consistently low we will start the patient on testosterone replacement.  We discussed the risk of blood clots and prostate cancer but he is willing to proceed.  I believe that subclinical hypothyroidism is likely contributing but I believe it is likely a small percentage.  I will check a free T3 and a free T4 and repeat a TSH.  As long as the T3 and T4 are normal I would not recommend thyroid replacement but I would monitor this every 6 months.  I believe he likely also has fatigue related to his beta-blocker.  I believe this could even be explaining some of his dizziness which is likely orthostatic dizziness due to relative drops in his blood pressure and heart  rate.  I encouraged the patient to try to drink more water to avoid this.  Await the results of his lab work

## 2021-08-07 ENCOUNTER — Other Ambulatory Visit: Payer: Self-pay | Admitting: Family Medicine

## 2021-08-07 DIAGNOSIS — N289 Disorder of kidney and ureter, unspecified: Secondary | ICD-10-CM

## 2021-08-07 LAB — BASIC METABOLIC PANEL WITH GFR
BUN/Creatinine Ratio: 13 (calc) (ref 6–22)
BUN: 20 mg/dL (ref 7–25)
CO2: 24 mmol/L (ref 20–32)
Calcium: 9.3 mg/dL (ref 8.6–10.3)
Chloride: 105 mmol/L (ref 98–110)
Creat: 1.56 mg/dL — ABNORMAL HIGH (ref 0.70–1.28)
Glucose, Bld: 104 mg/dL — ABNORMAL HIGH (ref 65–99)
Potassium: 4.8 mmol/L (ref 3.5–5.3)
Sodium: 138 mmol/L (ref 135–146)
eGFR: 47 mL/min/{1.73_m2} — ABNORMAL LOW (ref >=60)

## 2021-08-07 LAB — TSH: TSH: 4.77 mIU/L — ABNORMAL HIGH (ref 0.40–4.50)

## 2021-08-07 LAB — T4, FREE: Free T4: 1 ng/dL (ref 0.8–1.8)

## 2021-08-07 LAB — TESTOSTERONE TOTAL,FREE,BIO, MALES
Albumin: 4.1 g/dL (ref 3.6–5.1)
Sex Hormone Binding: 20 nmol/L — ABNORMAL LOW (ref 22–77)
Testosterone, Bioavailable: 123.4 ng/dL (ref 15.0–150.0)
Testosterone, Free: 65.5 pg/mL (ref 6.0–73.0)
Testosterone: 337 ng/dL (ref 250–827)

## 2021-08-07 LAB — T3, FREE: T3, Free: 3.2 pg/mL (ref 2.3–4.2)

## 2021-08-09 ENCOUNTER — Ambulatory Visit: Payer: PPO | Admitting: Pharmacist

## 2021-08-09 DIAGNOSIS — E785 Hyperlipidemia, unspecified: Secondary | ICD-10-CM

## 2021-08-09 DIAGNOSIS — I10 Essential (primary) hypertension: Secondary | ICD-10-CM

## 2021-08-09 NOTE — Patient Instructions (Addendum)
Visit Information   Goals Addressed             This Visit's Progress    Track and Manage My Blood Pressure-Hypertension   On track    Timeframe:  Long-Range Goal Priority:  High Start Date: 06/08/2020                             Expected End Date: 12/09/20                       Follow Up Date 08/20/20    - check blood pressure daily - choose a place to take my blood pressure (home, clinic or office, retail store) - write blood pressure results in a log or diary    Why is this important?   You won't feel high blood pressure, but it can still hurt your blood vessels.  High blood pressure can cause heart or kidney problems. It can also cause a stroke.  Making lifestyle changes like losing a little weight or eating less salt will help.  Checking your blood pressure at home and at different times of the day can help to control blood pressure.  If the doctor prescribes medicine remember to take it the way the doctor ordered.  Call the office if you cannot afford the medicine or if there are questions about it.     Notes: Goal < 140/90       Patient Care Plan: General Pharmacy (Adult)     Problem Identified: HTN, HLD   Priority: High  Onset Date: 06/08/2020     Long-Range Goal: Patient-Specific Goal   Start Date: 06/07/2020  Expected End Date: 12/09/2020  Recent Progress: On track  Priority: High  Note:   Current Barriers:  Declining renal function  Pharmacist Clinical Goal(s):  Patient will achieve adherence to monitoring guidelines and medication adherence to achieve therapeutic efficacy adhere to prescribed medication regimen as evidenced by fill dates contact provider office for questions/concerns as evidenced notation of same in electronic health record through collaboration with PharmD and provider.   Interventions: 1:1 collaboration with Susy Frizzle, MD regarding development and update of comprehensive plan of care as evidenced by provider attestation and  co-signature Inter-disciplinary care team collaboration (see longitudinal plan of care) Comprehensive medication review performed; medication list updated in electronic medical record  Hypertension (BP goal <140/90) 08/09/21 -Controlled -Current treatment: Losartan '50mg'$  Appropriate, Effective, Safe, Accessible Metoprolol XL '50mg'$  Appropriate, Effective, Safe, Accessible -Medications previously tried: Metoprolol XL, Imdur (headaches) -Current home readings: BP slightly on the higher side per his report, no logs discussed -Denies hypotensive/hypertensive symptoms -Educated on BP goals and benefits of medications for prevention of heart attack, stroke and kidney damage; Exercise goal of 150 minutes per week; Importance of home blood pressure monitoring; Symptoms of hypotension and importance of maintaining adequate hydration; -Counseled to monitor BP at home daily, document, and provide log at future appointments -Denies any dizziness at this time.  His BP was controlled at goal during last OV. -Continue to follow and adjust as needed, no changes at this time.  Update 12/28/20 120-130/80s.  Not having anymore dizziness. He has stopped the Imdur completely due to severe headaches which was OK's by cardiology Reports feeling well overall, encouraged physical activity, etc. Continue current meds for now - continue to monitor BP at home. Report consistent readings > 130/80 to providers.  Hyperlipidemia/CAD: (LDL goal < 70) 08/09/21 Controlled, based  on most recent LDL of 43 -Current treatment: Zetia '10mg'$  daily Appropriate, Effective, Safe, Accessible Rosuvastatin '40mg'$  Appropriate, Effective, Safe, Accessible -Medications previously tried: Atorvastatin (aches)  -Educated on Cholesterol goals;  Importance of limiting foods high in cholesterol; Exercise goal of 150 minutes per week;  -Recommended to continue current medication -LDL now at goal, he is tolerating Crestor and Zetia fine.  Only  concern now is declining renal function.  He is going to have an ultrasound on kidney tomorrow. If kidney function continues to decline will need to adjust dose of Crestor to appropriate renal dose or switch to another statin safer. Continue to monitor - no changes currently.  Update 12/29/20 LDL still above goal < 70 with CAD He cannot tolerate statins, adherent with Zetia currently Recommend continue to monitor. ASCVD risk is HIGH. Could possibly consider Repatha pending cost and patient willingness. Will address at next visit. No changes at this time.   Patient Goals/Self-Care Activities Patient will:  - take medications as prescribed focus on medication adherence by pill counts check blood pressure daily, document, and provide at future appointments target a minimum of 150 minutes of moderate intensity exercise weekly  Follow Up Plan: The care management team will reach out to the patient again over the next 120 days.            The patient verbalized understanding of instructions, educational materials, and care plan provided today and DECLINED offer to receive copy of patient instructions, educational materials, and care plan.  Telephone follow up appointment with pharmacy team member scheduled for: 6 months  Edythe Clarity, [place], PharmD, [place] Clinical Pharmacist Practitioner [place] [phone]

## 2021-08-10 ENCOUNTER — Ambulatory Visit
Admission: RE | Admit: 2021-08-10 | Discharge: 2021-08-10 | Disposition: A | Discharge status: Home / Self Care | Payer: PPO | Source: Ambulatory Visit | Attending: Family Medicine | Admitting: Family Medicine

## 2021-08-10 DIAGNOSIS — R944 Abnormal results of kidney function studies: Secondary | ICD-10-CM | POA: Diagnosis not present

## 2021-08-10 DIAGNOSIS — N289 Disorder of kidney and ureter, unspecified: Secondary | ICD-10-CM

## 2021-08-14 ENCOUNTER — Telehealth: Payer: Self-pay

## 2021-08-14 ENCOUNTER — Other Ambulatory Visit: Payer: Self-pay

## 2021-08-14 DIAGNOSIS — R1900 Intra-abdominal and pelvic swelling, mass and lump, unspecified site: Secondary | ICD-10-CM

## 2021-08-14 NOTE — Telephone Encounter (Signed)
Spoke with re [place] results, pt aware. Pt agreed to CT order for the pelvis for pelvic mass  Please advice if you want the CT with or w/o Contrast before I put order in.  Thank you

## 2021-08-23 ENCOUNTER — Ambulatory Visit
Admission: RE | Admit: 2021-08-23 | Discharge: 2021-08-23 | Disposition: A | Discharge status: Home / Self Care | Payer: PPO | Source: Ambulatory Visit | Attending: Family Medicine | Admitting: Family Medicine

## 2021-08-23 DIAGNOSIS — R1900 Intra-abdominal and pelvic swelling, mass and lump, unspecified site: Secondary | ICD-10-CM | POA: Diagnosis not present

## 2021-08-23 DIAGNOSIS — K760 Fatty (change of) liver, not elsewhere classified: Secondary | ICD-10-CM | POA: Diagnosis not present

## 2021-08-23 DIAGNOSIS — K449 Diaphragmatic hernia without obstruction or gangrene: Secondary | ICD-10-CM | POA: Diagnosis not present

## 2021-08-23 MED ORDER — IOHEXOL 300 MG/ML  SOLN
100.0000 mL | Freq: Once | INTRAMUSCULAR | Status: AC | PRN
  Administered 2021-08-23: 100 mL via INTRAVENOUS

## 2021-08-24 ENCOUNTER — Other Ambulatory Visit: Payer: Self-pay | Admitting: Family Medicine

## 2021-08-24 DIAGNOSIS — D4959 Neoplasm of unspecified behavior of other genitourinary organ: Secondary | ICD-10-CM

## 2021-08-27 ENCOUNTER — Other Ambulatory Visit: Payer: Self-pay

## 2021-08-27 DIAGNOSIS — D4959 Neoplasm of unspecified behavior of other genitourinary organ: Secondary | ICD-10-CM

## 2021-08-27 DIAGNOSIS — R1900 Intra-abdominal and pelvic swelling, mass and lump, unspecified site: Secondary | ICD-10-CM

## 2021-09-06 NOTE — Telephone Encounter (Signed)
Pt made an appoint w/urology on 09/20/21

## 2021-09-20 ENCOUNTER — Telehealth: Payer: Self-pay

## 2021-09-20 DIAGNOSIS — N401 Enlarged prostate with lower urinary tract symptoms: Secondary | ICD-10-CM | POA: Diagnosis not present

## 2021-09-20 DIAGNOSIS — R3914 Feeling of incomplete bladder emptying: Secondary | ICD-10-CM | POA: Diagnosis not present

## 2021-09-20 DIAGNOSIS — R9349 Abnormal radiologic findings on diagnostic imaging of other urinary organs: Secondary | ICD-10-CM | POA: Diagnosis not present

## 2021-09-20 DIAGNOSIS — N403 Nodular prostate with lower urinary tract symptoms: Secondary | ICD-10-CM | POA: Diagnosis not present

## 2021-09-20 NOTE — Telephone Encounter (Signed)
Pt called to advised you that when he went to see Dr. Milford Cage at [facility] today, the visit was entered as to discuss fatigue and low testosterone. 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 Pt states Dr. Milford Cage did address the correct issue and has ordered and MRI of the area for follow up. Thanks.

## 2021-09-21 ENCOUNTER — Other Ambulatory Visit: Payer: Self-pay | Admitting: Urology

## 2021-09-21 DIAGNOSIS — N138 Other obstructive and reflux uropathy: Secondary | ICD-10-CM

## 2021-09-30 ENCOUNTER — Other Ambulatory Visit: Payer: Self-pay | Admitting: Family Medicine

## 2021-10-02 ENCOUNTER — Other Ambulatory Visit: Payer: Self-pay

## 2021-10-02 MED ORDER — CLOPIDOGREL BISULFATE 75 MG PO TABS: 75.0000 mg | ORAL_TABLET | Freq: Every day | ORAL | 1 refills | Status: DC

## 2021-10-04 ENCOUNTER — Ambulatory Visit (INDEPENDENT_AMBULATORY_CARE_PROVIDER_SITE_OTHER): Payer: PPO | Admitting: Family Medicine

## 2021-10-04 VITALS — BP 120/72 | HR 65 | Temp 98.4°F | Ht 72.0 in | Wt 218.8 lb

## 2021-10-04 DIAGNOSIS — B029 Zoster without complications: Secondary | ICD-10-CM

## 2021-10-04 MED ORDER — HYDROCODONE-ACETAMINOPHEN 5-325 MG PO TABS: 1.0000 | ORAL_TABLET | Freq: Four times a day (QID) | ORAL | 0 refills | Status: DC | PRN

## 2021-10-04 MED ORDER — VALACYCLOVIR HCL 1 G PO TABS: 1000.0000 mg | ORAL_TABLET | Freq: Three times a day (TID) | ORAL | 0 refills | Status: AC

## 2021-10-04 NOTE — Progress Notes (Signed)
Subjective:    Patient ID: [name], male    DOB: 23-Jul-1950, 71 y.o.   MRN: [id number] Patient has a painful blistering rash starting in the midline of his back roughly around the level of T3 radiating to the left following a dermatomal pattern all the way under his left axilla.  The rash consists of erythematous papules and vesicles in clusters and pairs.  Patient states that it itches and burns.  Past Medical History:  Diagnosis Date   Adenomatous colon polyp 2004   Allergy    Diverticulosis 2011   Former smoker    HLD (hyperlipidemia)    Hyperplastic colon polyp 2004   Hypertension    Melanoma in situ [facility])    Past Surgical History:  Procedure Laterality Date   COLONOSCOPY  01/21/2009   SEVERAL    CORONARY STENT INTERVENTION N/A 10/04/2020   Procedure: CORONARY STENT INTERVENTION;  Surgeon: Leonie Man, MD;  Location: [place] [facility];  Service: Cardiovascular;  Laterality: N/A;   LEFT HEART CATH AND CORONARY ANGIOGRAPHY N/A 10/04/2020   Procedure: LEFT HEART CATH AND CORONARY ANGIOGRAPHY;  Surgeon: Leonie Man, MD;  Location: [place] [facility];  Service: Cardiovascular;  Laterality: N/A;   LEFT HEART CATH AND CORONARY ANGIOGRAPHY Left 11/30/2020   Procedure: LEFT HEART CATH AND CORONARY ANGIOGRAPHY;  Surgeon: Leonie Man, MD;  Location: [place] [facility];  Service: Cardiovascular;  Laterality: Left;   TOOTH EXTRACTION     TOTAL KNEE ARTHROPLASTY Right 04/26/2020   Current Outpatient Medications on File Prior to Visit  Medication Sig Dispense Refill   aspirin EC 81 MG tablet Take 81 mg by mouth daily. Swallow whole.     cetirizine (ZYRTEC) 10 MG tablet TAKE 1 TABLET BY MOUTH EVERY DAY 90 tablet 3   clopidogrel (PLAVIX) 75 MG tablet Take 1 tablet (75 mg total) by mouth daily. 30 tablet 1   ezetimibe (ZETIA) 10 MG tablet TAKE 1 TABLET BY MOUTH EVERY DAY 90 tablet 3   isosorbide mononitrate (IMDUR) 30 MG 24 hr tablet Take 0.5 tablets (15 mg total) by  mouth daily. 45 tablet 2   losartan (COZAAR) 50 MG tablet Take 0.5 tablets (25 mg total) by mouth in the morning. 45 tablet 1   metoprolol succinate (TOPROL-XL) 25 MG 24 hr tablet Take 25 mg by mouth daily.     nitroGLYCERIN (NITROSTAT) 0.4 MG SL tablet PLACE 1 TABLET UNDER THE TONGUE EVERY 5 MINUTES AS NEEDED FOR CHEST PAIN. 50 tablet 3   pantoprazole (PROTONIX) 40 MG tablet Take 1 tablet (40 mg total) by mouth daily. 30 tablet 4   rosuvastatin (CRESTOR) 40 MG tablet Take 1 tablet (40 mg total) by mouth daily. 30 tablet 2   tiZANidine (ZANAFLEX) 4 MG tablet Take 1 tablet (4 mg total) by mouth every 6 (six) hours as needed for muscle spasms. 30 tablet 0   No current facility-administered medications on file prior to visit.   Allergies  Allergen Reactions   Levaquin [Levofloxacin In D5w] Shortness Of Breath   Social History   Socioeconomic History   Marital status: Married    Spouse name: [name]   Number of children: 2   Years of education: Not on file   Highest education level: Not on file  Occupational History   Occupation: part Scientist, clinical (histocompatibility and immunogenetics): [employer]  Tobacco Use   Smoking status: Former    Packs/day: 1.00    Years: 30.00  Total pack years: 30.00    Types: Cigarettes, Cigars    Quit date: 05/13/1998    Years since quitting: 23.4   Smokeless tobacco: Current    Types: Chew  Vaping Use   Vaping Use: Never used  Substance and Sexual Activity   Alcohol use: Yes    Alcohol/week: 3.0 standard drinks of alcohol    Types: 3 Cans of beer per week   Drug use: No   Sexual activity: Not Currently  Other Topics Concern   Not on file  Social History Narrative   Married x 51 years   2 grandsons   Social Determinants of Health   Financial Resource Strain: Low Risk  (11/09/2020)   Overall Financial Resource Strain (CARDIA)    Difficulty of Paying Living Expenses: Not hard at all  Food Insecurity: No Food Insecurity (11/09/2020)   Hunger Vital Sign    Worried About Running  Out of Food in the Last Year: Never true    Ran Out of Food in the Last Year: Never true  Transportation Needs: No Transportation Needs (11/09/2020)   PRAPARE - Hydrologist (Medical): No    Lack of Transportation (Non-Medical): No  Physical Activity: Sufficiently Active (11/09/2020)   Exercise Vital Sign    Days of Exercise per Week: 5 days    Minutes of Exercise per Session: 30 min  Stress: No Stress Concern Present (11/09/2020)   [place]    Feeling of Stress : Not at all  Social Connections: [place] (11/09/2020)   Social Connection and Isolation Panel [NHANES]    Frequency of Communication with Friends and Family: Three times a week    Frequency of Social Gatherings with Friends and Family: Three times a week    Attends Religious Services: More than 4 times per year    Active Member of Clubs or Organizations: Yes    Attends Archivist Meetings: More than 4 times per year    Marital Status: Married  Human resources officer Violence: Not At Risk (11/09/2020)   Humiliation, Afraid, Rape, and Kick questionnaire    Fear of Current or Ex-Partner: No    Emotionally Abused: No    Physically Abused: No    Sexually Abused: No      Review of Systems  All other systems reviewed and are negative.      Objective:   Physical Exam Vitals reviewed.  Constitutional:      General: He is not in acute distress.    Appearance: He is well-developed. He is not diaphoretic.  HENT:     Mouth/Throat:     Pharynx: No oropharyngeal exudate.  Eyes:     Conjunctiva/sclera: Conjunctivae normal.     Pupils: Pupils are equal, round, and reactive to light.  Cardiovascular:     Rate and Rhythm: Normal rate and regular rhythm.     Heart sounds: Normal heart sounds.  Pulmonary:     Effort: Pulmonary effort is normal. No respiratory distress.     Breath sounds: Normal breath sounds. No  wheezing or rales.  Musculoskeletal:     Cervical back: Neck supple.       Back:  Lymphadenopathy:     Cervical: No cervical adenopathy.  Skin:    Findings: Erythema and rash present. Rash is papular and vesicular.           Assessment & Plan:  Herpes zoster without complication Patient appears to have  shingles.  Begin Valtrex 1 g p.o. 3 times daily for 7 days.  Use hydrocodone 5/325 1 p.o. every 6 hours as needed pain if necessary.  Recheck in 1 week if no better or sooner if worse.

## 2021-10-07 DIAGNOSIS — Z955 Presence of coronary angioplasty implant and graft: Secondary | ICD-10-CM | POA: Diagnosis not present

## 2021-10-07 DIAGNOSIS — Z85820 Personal history of malignant melanoma of skin: Secondary | ICD-10-CM | POA: Diagnosis not present

## 2021-10-07 DIAGNOSIS — Z87891 Personal history of nicotine dependence: Secondary | ICD-10-CM | POA: Diagnosis not present

## 2021-10-07 DIAGNOSIS — E785 Hyperlipidemia, unspecified: Secondary | ICD-10-CM | POA: Diagnosis not present

## 2021-10-07 DIAGNOSIS — I10 Essential (primary) hypertension: Secondary | ICD-10-CM | POA: Diagnosis not present

## 2021-10-07 DIAGNOSIS — R0789 Other chest pain: Secondary | ICD-10-CM | POA: Diagnosis not present

## 2021-10-07 DIAGNOSIS — I700 Atherosclerosis of aorta: Secondary | ICD-10-CM | POA: Diagnosis not present

## 2021-10-07 DIAGNOSIS — R079 Chest pain, unspecified: Secondary | ICD-10-CM | POA: Diagnosis not present

## 2021-10-07 DIAGNOSIS — I2510 Atherosclerotic heart disease of native coronary artery without angina pectoris: Secondary | ICD-10-CM | POA: Diagnosis not present

## 2021-10-08 ENCOUNTER — Emergency Department: Payer: PPO

## 2021-10-08 ENCOUNTER — Emergency Department
Admission: EM | Admit: 2021-10-08 | Discharge: 2021-10-08 | Disposition: A | Discharge status: Home / Self Care | Payer: PPO | Attending: Emergency Medicine | Admitting: Emergency Medicine

## 2021-10-08 ENCOUNTER — Telehealth: Payer: Self-pay

## 2021-10-08 ENCOUNTER — Other Ambulatory Visit: Payer: Self-pay

## 2021-10-08 ENCOUNTER — Other Ambulatory Visit: Payer: Self-pay | Admitting: Family Medicine

## 2021-10-08 DIAGNOSIS — I700 Atherosclerosis of aorta: Secondary | ICD-10-CM | POA: Diagnosis not present

## 2021-10-08 DIAGNOSIS — R079 Chest pain, unspecified: Secondary | ICD-10-CM

## 2021-10-08 DIAGNOSIS — D492 Neoplasm of unspecified behavior of bone, soft tissue, and skin: Secondary | ICD-10-CM

## 2021-10-08 DIAGNOSIS — R222 Localized swelling, mass and lump, trunk: Secondary | ICD-10-CM

## 2021-10-08 DIAGNOSIS — I10 Essential (primary) hypertension: Secondary | ICD-10-CM | POA: Diagnosis not present

## 2021-10-08 DIAGNOSIS — E785 Hyperlipidemia, unspecified: Secondary | ICD-10-CM | POA: Diagnosis not present

## 2021-10-08 DIAGNOSIS — I2510 Atherosclerotic heart disease of native coronary artery without angina pectoris: Secondary | ICD-10-CM | POA: Diagnosis not present

## 2021-10-08 LAB — BASIC METABOLIC PANEL
Anion gap: 5 (ref 5–15)
BUN: 18 mg/dL (ref 8–23)
CO2: 24 mmol/L (ref 22–32)
Calcium: 9 mg/dL (ref 8.9–10.3)
Chloride: 107 mmol/L (ref 98–111)
Creatinine, Ser: 1.13 mg/dL (ref 0.61–1.24)
GFR, Estimated: >60 mL/min (ref >60)
Glucose, Bld: 114 mg/dL — ABNORMAL HIGH (ref 70–99)
Potassium: 4.2 mmol/L (ref 3.5–5.1)
Sodium: 136 mmol/L (ref 135–145)

## 2021-10-08 LAB — CBC
HCT: 42.3 % (ref 39.0–52.0)
Hemoglobin: 13.6 g/dL (ref 13.0–17.0)
MCH: 27.3 pg (ref 26.0–34.0)
MCHC: 32.2 g/dL (ref 30.0–36.0)
MCV: 84.8 fL (ref 80.0–100.0)
Platelets: 247 10*3/uL (ref 150–400)
RBC: 4.99 MIL/uL (ref 4.22–5.81)
RDW: 13 % (ref 11.5–15.5)
WBC: 9.3 10*3/uL (ref 4.0–10.5)
nRBC: 0 % (ref 0.0–0.2)

## 2021-10-08 LAB — TROPONIN I (HIGH SENSITIVITY)
Troponin I (High Sensitivity): 5 ng/L (ref <18)
Troponin I (High Sensitivity): 6 ng/L (ref <18)
Troponin I (High Sensitivity): 7 ng/L (ref <18)

## 2021-10-08 MED ORDER — MORPHINE SULFATE (PF) 4 MG/ML IV SOLN
4.0000 mg | Freq: Once | INTRAVENOUS | Status: AC
  Administered 2021-10-08: 4 mg via INTRAVENOUS
  Filled 2021-10-08: qty 1

## 2021-10-08 MED ORDER — ALUM & MAG HYDROXIDE-SIMETH 200-200-20 MG/5ML PO SUSP
30.0000 mL | Freq: Once | ORAL | Status: AC
  Administered 2021-10-08: 30 mL via ORAL
  Filled 2021-10-08: qty 30

## 2021-10-08 MED ORDER — IOHEXOL 350 MG/ML SOLN
75.0000 mL | Freq: Once | INTRAVENOUS | Status: AC | PRN
  Administered 2021-10-08: 75 mL via INTRAVENOUS

## 2021-10-08 MED ORDER — NITROGLYCERIN 0.4 MG SL SUBL
0.4000 mg | SUBLINGUAL_TABLET | SUBLINGUAL | Status: DC | PRN
  Administered 2021-10-08: 0.4 mg via SUBLINGUAL
  Filled 2021-10-08: qty 1

## 2021-10-08 MED ORDER — ACETAMINOPHEN 500 MG PO TABS
1000.0000 mg | ORAL_TABLET | Freq: Once | ORAL | Status: AC
  Administered 2021-10-08: 1000 mg via ORAL
  Filled 2021-10-08: qty 2

## 2021-10-08 NOTE — Telephone Encounter (Signed)
Pt called, requesting that you call him, pt states he needs to speak with you only. Pt's call back # is [phone]. Pt did not specify if it was regarding himself or his wife Vanita Ingles. Thank you.

## 2021-10-08 NOTE — ED Provider Notes (Signed)
3rd troponin negative. EKG without concerning findings. 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 Discussed this with the patient. He states that he already has a relationship with an ocologist for a mass in his pelvis and he is scheduled for MRI next week. Did encourage patient to contact his provider to see about also then obtaining imaging of his upper spine given ct scan finding. In terms of the pain the patient is feeling better. At this time think reasonable for patient to be discharged.    Nance Pear, MD 10/08/21 [phone]

## 2021-10-08 NOTE — ED Provider Notes (Addendum)
[facility] Provider Note    Event Date/Time   First MD Initiated Contact with Patient 10/08/21 [phone]     (approximate)   History   Chest Pain   HPI  [name] is a 71 y.o. male with history of hypertension, hyperlipidemia, coronary artery disease status post PCI to LAD in 9/22 who presents with chest pain.  Symptoms started about 8 hours prior to arrival started when he was just sitting down.  He endorses a pressure sensation in the front of his chest.  Does not radiate.  Has been rather constant since onset.  Denies associated nausea diaphoresis.  He denies pain worse with exertion.  No cough fevers or recent illnesses.  Denies history of DVT/PE lower extremity swelling or pain.  Patient has had a stent is LAD in September of last year.  I see he last saw cardiology 4 months ago.  That time was documented that he had an episode of chest pain that required nitroglycerin.  Patient did take nitroglycerin today did not significantly help his pain.     Past Medical History:  Diagnosis Date   Adenomatous colon polyp 2004   Allergy    Diverticulosis 2011   Former smoker    HLD (hyperlipidemia)    Hyperplastic colon polyp 2004   Hypertension    Melanoma in situ [facility])     Patient Active Problem List   Diagnosis Date Noted   COPD (chronic obstructive pulmonary disease) ([place]) 01/30/2021   Daytime sleepiness 01/30/2021   Angina, class III ([place]) 10/04/2020   Abnormal cardiac CT angiography 10/04/2020   Coronary artery disease involving native coronary artery of native heart with angina pectoris ([place])    Left hamstring injury 09/13/2019   Low back pain radiating to right leg 06/15/2019   Posterior tibial tendinitis, left 12/04/2018   Bilateral leg pain 05/21/2017   Degenerative arthritis of knee, bilateral 02/18/2017   Bilateral knee pain 07/18/2016   Right foot pain 04/16/2016   LLQ abdominal pain 06/10/2013   Melanoma in situ ([place])    Shortness of  breath 05/25/2012   Allergic rhinitis 05/25/2012   Diverticulosis    Former smoker    HLD (hyperlipidemia)    Allergy    ABDOMINAL PAIN, LEFT LOWER QUADRANT 10/23/2009   GERD 03/31/2007   DIVERTICULITIS, ACUTE 03/31/2007   SINUSITIS- ACUTE-NOS 02/18/2007   URI 02/18/2007   ERECTILE DYSFUNCTION 10/29/2006   TOBACCO ABUSE 10/29/2006   Hyperlipidemia with target LDL less than 70 10/01/2006   Essential hypertension 10/01/2006   DIVERTICULOSIS, COLON 10/01/2006     Physical Exam  Triage Vital Signs: ED Triage Vitals [10/08/21 0003]  Enc Vitals Group     BP (!) 151/89     Pulse Rate 73     Resp 18     Temp 97.8 F (36.6 C)     Temp Source Oral     SpO2 96 %     Weight 215 lb (97.5 kg)     Height 6' (1.829 m)     Head Circumference      Peak Flow      Pain Score      Pain Loc      Pain Edu?      Excl. in [place]?     Most recent vital signs: Vitals:   10/08/21 0645 10/08/21 0646  BP: 139/88   Pulse: 76   Resp: (!) 21   Temp:  97.9 F (36.6 C)  SpO2: 100%  General: Awake, no distress.  CV:  Good peripheral perfusion.  No peripheral edema Resp:  Normal effort.  Patient's lungs are clear Abd:  No distention.  Neuro:             Awake, Alert, Oriented x 3  Other:     ED Results / Procedures / Treatments  Labs (all labs ordered are listed, but only abnormal results are displayed) Labs Reviewed  BASIC METABOLIC PANEL - Abnormal; Notable for the following components:      Result Value   Glucose, Bld 114 (*)    All other components within normal limits  CBC  TROPONIN I (HIGH SENSITIVITY)  TROPONIN I (HIGH SENSITIVITY)     EKG  EKG interpretation performed by myself: NSR, nml axis, nml intervals, no acute ischemic changes    RADIOLOGY I reviewed and interpreted the CXR which does not show any acute cardiopulmonary process    PROCEDURES:  Critical Care performed: No  Procedures  The patient is on the cardiac monitor to evaluate for evidence of  arrhythmia and/or significant heart rate changes.   MEDICATIONS ORDERED IN ED: Medications  nitroGLYCERIN (NITROSTAT) SL tablet 0.4 mg (0.4 mg Sublingual Given 10/08/21 0645)  alum & mag hydroxide-simeth (MAALOX/MYLANTA) 200-200-20 MG/5ML suspension 30 mL (30 mLs Oral Given 10/08/21 0643)  acetaminophen (TYLENOL) tablet 1,000 mg (1,000 mg Oral Given 10/08/21 3016)     IMPRESSION / MDM / ASSESSMENT AND PLAN / ED COURSE  I reviewed the triage vital signs and the nursing notes.                              Patient's presentation is most consistent with acute presentation with potential threat to life or bodily function.  Differential diagnosis includes, but is not limited to, acute coronary syndrome, angina, musculoskeletal pain, GERD, less likely aortic dissection, pulmonary embolism  The patient is a 71 year old male with a history of coronary disease with stenting about a year ago who presents with chest pain.  This episode started about 8 hours ago.  It is pressure-like nonradiating nonexertional without associated nausea or diaphoresis.  Patient looks well.  Does still have about 5 out of 10 pain now.  Blood pressure mildly elevated with vital signs otherwise reassuring.  Cardiopulmonary exam is reassuring.  Patient's EKG is nonischemic.  Troponins x2 are negative.  That this feels somewhat similar to his prior heart attack but he recently had associated diaphoresis that he does not have anymore.  His work-up here is reassuring however he is high risk for cardiac disease and I am concerned for potentially unstable angina.  We will give GI cocktail Percocet and nitro and see if we can get his pain under control.  If pain persists then I think he would likely benefit from admission and may need to have provocative testing.   Patient having worsening chest pain right now.  We will repeat troponin and EKG.  We will give morphine.  I have put him in for a CT angio of the chest to rule out pulmonary  embolism.  Signed out to oncoming provider.  If CTA negative and repeat troponin and EKG are reassuring and patient is pain-free he could potentially be discharged but if he has ongoing pain   FINAL CLINICAL IMPRESSION(S) / ED DIAGNOSES   Final diagnoses:  Chest pain, unspecified type     Rx / DC Orders   ED Discharge Orders  None        Note:  This document was prepared using Dragon voice recognition software and may include unintentional dictation errors.   Rada Hay, MD 10/08/21 0881    Rada Hay, MD 10/08/21 [phone]

## 2021-10-08 NOTE — Discharge Instructions (Signed)
As we discussed please contact your doctor about obtaining further imaging of the mass seen in your upper chest on CT scan today.

## 2021-10-08 NOTE — ED Triage Notes (Signed)
Patient reports constant, non-radiating right chest pain since 1800. Denies increased shortness of breath. Denies nausea. Reports he is also taking medication for shingles to left chest. AOX4. Ambulatory. Resp even, unlabored on RA.

## 2021-10-09 ENCOUNTER — Telehealth: Payer: Self-pay

## 2021-10-09 NOTE — Telephone Encounter (Signed)
Pt called back and asks if you would call him back at [phone]. Pt states he has another question for you. Thank you.

## 2021-10-10 ENCOUNTER — Telehealth: Payer: Self-pay

## 2021-10-10 ENCOUNTER — Ambulatory Visit
Admission: RE | Admit: 2021-10-10 | Discharge: 2021-10-10 | Disposition: A | Discharge status: Home / Self Care | Payer: PPO | Source: Ambulatory Visit | Attending: Family Medicine | Admitting: Family Medicine

## 2021-10-10 DIAGNOSIS — M546 Pain in thoracic spine: Secondary | ICD-10-CM | POA: Diagnosis not present

## 2021-10-10 DIAGNOSIS — D492 Neoplasm of unspecified behavior of bone, soft tissue, and skin: Secondary | ICD-10-CM

## 2021-10-10 NOTE — Telephone Encounter (Signed)
Pt called asking if you would be willing to see his friend, [name] DOB [birth year], call back # [phone]. I called and spoke to Mr. [name] and he states, "I currently have a doctor but I need a good doctor." Pt is being treated for HTN and states it is under control. Thank you.

## 2021-10-12 ENCOUNTER — Telehealth: Payer: Self-pay

## 2021-10-12 ENCOUNTER — Other Ambulatory Visit: Payer: Self-pay | Admitting: Family Medicine

## 2021-10-12 ENCOUNTER — Other Ambulatory Visit: Payer: PPO

## 2021-10-12 DIAGNOSIS — D492 Neoplasm of unspecified behavior of bone, soft tissue, and skin: Secondary | ICD-10-CM

## 2021-10-12 NOTE — Telephone Encounter (Signed)
        Patient  visited [facility] on 9/18   Telephone encounter attempt :  1st  A HIPAA compliant voice message was left requesting a return call.  Instructed patient to call back    [place], [place] Management  [phone] [address], [place], [place] [address] Phone: [phone] Email: [email or web address]

## 2021-10-15 ENCOUNTER — Ambulatory Visit
Admission: RE | Admit: 2021-10-15 | Discharge: 2021-10-15 | Disposition: A | Discharge status: Home / Self Care | Payer: PPO | Source: Ambulatory Visit | Attending: Urology | Admitting: Urology

## 2021-10-15 DIAGNOSIS — N400 Enlarged prostate without lower urinary tract symptoms: Secondary | ICD-10-CM | POA: Diagnosis not present

## 2021-10-15 DIAGNOSIS — R972 Elevated prostate specific antigen [PSA]: Secondary | ICD-10-CM | POA: Diagnosis not present

## 2021-10-15 DIAGNOSIS — N5089 Other specified disorders of the male genital organs: Secondary | ICD-10-CM | POA: Diagnosis not present

## 2021-10-15 DIAGNOSIS — N138 Other obstructive and reflux uropathy: Secondary | ICD-10-CM

## 2021-10-15 DIAGNOSIS — K6289 Other specified diseases of anus and rectum: Secondary | ICD-10-CM | POA: Diagnosis not present

## 2021-10-15 MED ORDER — GADOBENATE DIMEGLUMINE 529 MG/ML IV SOLN
20.0000 mL | Freq: Once | INTRAVENOUS | Status: AC | PRN
  Administered 2021-10-15: 20 mL via INTRAVENOUS

## 2021-10-17 ENCOUNTER — Other Ambulatory Visit: Payer: Self-pay

## 2021-10-17 MED ORDER — ROSUVASTATIN CALCIUM 40 MG PO TABS: 40.0000 mg | ORAL_TABLET | Freq: Every day | ORAL | 0 refills | Status: DC

## 2021-10-24 IMAGING — CT CT HEART MORP W/ CTA COR W/ SCORE W/ CA W/CM &/OR W/O CM
1 of 16 series · 1 of 20 positions shown, 2 images · non-contrast
Comparison: CT of the chest from 6808.

Addendum:
CLINICAL DATA: Chest pain

EXAM:
Cardiac/Coronary  CTA
TECHNIQUE: The patient was scanned on a Siemens Somatom go.Top scanner.

[Series 5: sa36 calcium scoring 3.00 · axial · 0.37mm/px · z∈[-1154,-1154]mm · 1 of 46 slices shown, 2 images]
[im 1/46  vessel]
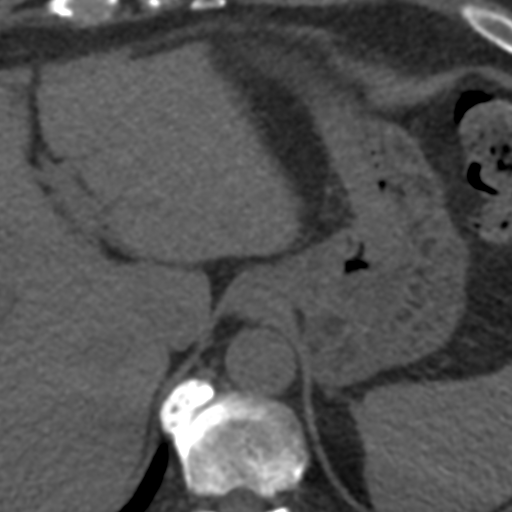
[im 1/46  lung]
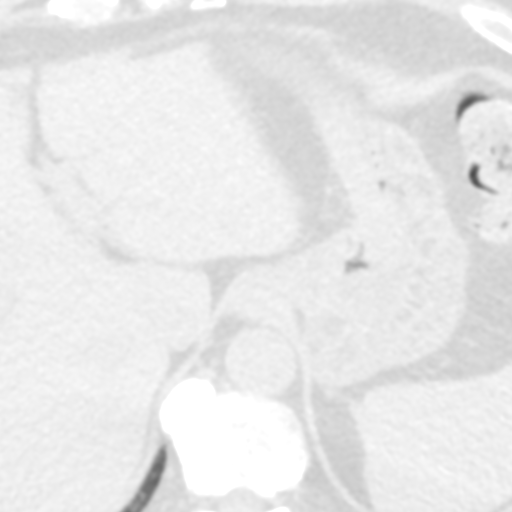

[1 of 20 positions shown; findings below may reference images not displayed]



Aortic Valve:  Trileaflet.  No calcifications.

Coronary Arteries:  Normal coronary origin.  left dominance.

RCA is a small non dominant artery. There is calcified plaque in the
proximal RCA causing moderate (50-69%) stenosis.

Left main is a large artery that gives rise to LAD and LCX arteries.
LM has no disease.

LAD has calcified plaque in the proximal segment causing severe
stenosis (>70%).

LCX is a dominant artery that gives rise to one large OM1 branch. It
also supplies the PDA. There is no plaque.

Other findings:

Normal pulmonary vein drainage into the left atrium.

Normal left atrial appendage without a thrombus.

Normal size of the pulmonary artery.
IMPRESSION: 1. Coronary calcium score of 728. This was 78th percentile for age
and sex matched control.

2. Normal coronary origin with left dominance.

3. Severe proximal LAD disease (>70%).

4. Moderate disease in proximal RCA (small non dominant vessel)

5. CAD-RADS 4 Severe stenosis. (70-99% or > 50% left main). Cardiac
catheterization or CT FFR is recommended. Consider symptom-guided
anti-ischemic pharmacotherapy as well as risk factor modification
per guideline directed care.

Additional analysis with CT FFR will be submitted and reported
separately.

EXAM:
OVER-READ INTERPRETATION  CT CHEST

The following report is an over-read performed by radiologist Dr.
does not include interpretation of cardiac or coronary anatomy or
pathology. The calcium score and coronary CTA interpretation by the
cardiologist is attached.
FINDINGS: Cardiovascular: See dedicated report for cardiovascular details.

Mediastinum/Nodes: Limited imaging of the chest extends to the heart
only, imaged portions of the mediastinum without signs of acute
finding or adenopathy.

Lungs/Pleura: Basilar atelectasis, no sign of effusion or
consolidation. Visualized airways are patent.

Upper Abdomen: Incidental imaging of upper abdominal contents show
no acute findings on very limited assessment.

Musculoskeletal: Spinal degenerative changes. No acute or
destructive bone process.
IMPRESSION: No acute or significant extracardiac findings.



Aortic Valve:  Trileaflet.  No calcifications.

Coronary Arteries:  Normal coronary origin.  left dominance.

RCA is a small non dominant artery. There is calcified plaque in the
proximal RCA causing moderate (50-69%) stenosis.

Left main is a large artery that gives rise to LAD and LCX arteries.
LM has no disease.

LAD has calcified plaque in the proximal segment causing severe
stenosis (>70%).

LCX is a dominant artery that gives rise to one large OM1 branch. It
also supplies the PDA. There is no plaque.

Other findings:

Normal pulmonary vein drainage into the left atrium.

Normal left atrial appendage without a thrombus.

Normal size of the pulmonary artery.
IMPRESSION: 1. Coronary calcium score of 728. This was 78th percentile for age
and sex matched control.

2. Normal coronary origin with left dominance.

3. Severe proximal LAD disease (>70%).

4. Moderate disease in proximal RCA (small non dominant vessel)

5. CAD-RADS 4 Severe stenosis. (70-99% or > 50% left main). Cardiac
catheterization or CT FFR is recommended. Consider symptom-guided
anti-ischemic pharmacotherapy as well as risk factor modification
per guideline directed care.

Additional analysis with CT FFR will be submitted and reported
separately.

## 2021-10-25 ENCOUNTER — Telehealth: Payer: Self-pay | Admitting: Pharmacist

## 2021-10-25 NOTE — Progress Notes (Signed)
Chronic Care Management Pharmacy Assistant   Name: [name]  MRN: [id number] DOB: [birth year]   Reason for Encounter: Disease State - Hypertension Call      Recent office visits:  10/04/21 [name], MD - Herpes Zoster -  Hydrocodone-acetaminophen (NORCO) 5-325 MG tablet and valACYclovir (VALTREX) 1000 MG tablet prescribed. Follow up in 1 week.   Recent consult visits:  None noted.  Hospital visits: 10/08/21 - 10/09/21 Medication Reconciliation was completed by comparing discharge summary, patient's EMR and Pharmacy list, and upon discussion with patient.  Admitted to the hospital on 10/08/21 due to Chest pain. Discharge date was 10/09/21. Discharged from [facility]?Medications Started at [facility] Discharge:?? None noted.   Medication Changes at Hospital Discharge: None noted  Medications Discontinued at Hospital Discharge: None noted  Medications that remain the same after Hospital Discharge:??  All other medications will remain the same.    Medications: Outpatient Encounter Medications as of 10/25/2021  Medication Sig   aspirin EC 81 MG tablet Take 81 mg by mouth daily. Swallow whole.   cetirizine (ZYRTEC) 10 MG tablet TAKE 1 TABLET BY MOUTH EVERY DAY   clopidogrel (PLAVIX) 75 MG tablet Take 1 tablet (75 mg total) by mouth daily.   ezetimibe (ZETIA) 10 MG tablet TAKE 1 TABLET BY MOUTH EVERY DAY   HYDROcodone-acetaminophen (NORCO) 5-325 MG tablet Take 1 tablet by mouth every 6 (six) hours as needed for moderate pain.   isosorbide mononitrate (IMDUR) 30 MG 24 hr tablet Take 0.5 tablets (15 mg total) by mouth daily.   losartan (COZAAR) 50 MG tablet Take 0.5 tablets (25 mg total) by mouth in the morning.   metoprolol succinate (TOPROL-XL) 25 MG 24 hr tablet Take 25 mg by mouth daily.   nitroGLYCERIN (NITROSTAT) 0.4 MG SL tablet PLACE 1 TABLET UNDER THE TONGUE EVERY 5 MINUTES AS NEEDED FOR CHEST PAIN.   pantoprazole (PROTONIX) 40 MG tablet  Take 1 tablet (40 mg total) by mouth daily.   rosuvastatin (CRESTOR) 40 MG tablet Take 1 tablet (40 mg total) by mouth daily.   tiZANidine (ZANAFLEX) 4 MG tablet Take 1 tablet (4 mg total) by mouth every 6 (six) hours as needed for muscle spasms.   No facility-administered encounter medications on file as of 10/25/2021.    Current antihypertensive regimen:  Losartan '50mg'$   Metoprolol XL '50mg'$   How often are you checking your Blood Pressure?  Patient reported checking blood pressures daily.  Current home BP readings: 130/80 (range given by patient)    What recent interventions/DTPs have been made by any provider to improve Blood Pressure control since last CPP Visit:  Patient denied any recent changes in medication regimen since last visit with CPP.   Any recent hospitalizations or ED visits since last visit with CPP? Patient had a hospitalization for chest pain on 08/07/21-08/08/21   What diet changes have been made to improve Blood Pressure Control?  Patient reported he is limiting his salt intake as best as can.    What exercise is being done to improve your Blood Pressure Control?  Patient reported remaining active. He was recently diagnosed with shingles and is currently getting over that.   Adherence Review: Is the patient currently on ACE/ARB medication? Yes Does the patient have >5 day gap between last estimated fill dates? No    Care Gaps   AWV: done 11/09/20 Colonoscopy: done 11/30/19 DM Eye Exam: N/A DM Foot Exam: N/A Microalbumin: N/A HbgAIC: N/A DEXA:  N/A Mammogram:N/A     Star Rating Drugs: Rosuvastatin (CRESTOR) 40 MG tablet - last filled 07/23/21 90 days  Losartan (COZAAR) 50 MG tablet - last filled 06/11/21 90 days      Future Appointments  Date Time Provider [place]  11/09/2021  2:10 PM [facility]-NURSE HEALTH ADVISOR [facility]  12/21/2021  4:20 PM Kate Sable, MD [facility] None  02/06/2022  2:00 PM [facility] PHARMACIST [facility]     Jobe Gibbon, [facility] Clinical Pharmacist Assistant  [phone]

## 2021-10-29 DIAGNOSIS — D3610 Benign neoplasm of peripheral nerves and autonomic nervous system, unspecified: Secondary | ICD-10-CM | POA: Diagnosis not present

## 2021-10-29 DIAGNOSIS — Z6829 Body mass index (BMI) 29.0-29.9, adult: Secondary | ICD-10-CM | POA: Diagnosis not present

## 2021-11-01 DIAGNOSIS — R3914 Feeling of incomplete bladder emptying: Secondary | ICD-10-CM | POA: Diagnosis not present

## 2021-11-01 DIAGNOSIS — N401 Enlarged prostate with lower urinary tract symptoms: Secondary | ICD-10-CM | POA: Diagnosis not present

## 2021-11-02 ENCOUNTER — Telehealth: Payer: Self-pay | Admitting: Cardiology

## 2021-11-02 NOTE — Telephone Encounter (Signed)
   Pre-operative Risk Assessment    Patient Name: [name]  DOB: [birth year] MRN: [id number]{      Request for Surgical Clearance    Procedure:   RADICAL PROSTATETECTOMY /NODE DISSECTION WITH CURATIVE INTENT } Date of Surgery:  Clearance TBD                                Surgeon:  Alexis Frock, MD Surgeon's Group or Practice Name:  [facility] Phone number:  [phone] Fax number:  [phone]  Type of Clearance Requested:   - Medical    Type of Anesthesia:  Not Indicated   Additional requests/questions:    Signed, Eli Phillips   11/02/2021, 3:26 PM

## 2021-11-05 ENCOUNTER — Other Ambulatory Visit: Payer: Self-pay

## 2021-11-05 ENCOUNTER — Telehealth: Payer: Self-pay | Admitting: *Deleted

## 2021-11-05 MED ORDER — CLOPIDOGREL BISULFATE 75 MG PO TABS: 75.0000 mg | ORAL_TABLET | Freq: Every day | ORAL | 0 refills | Status: DC

## 2021-11-05 NOTE — Telephone Encounter (Signed)
   Name: [name]  DOB: [birth year]  MRN: [id number]  Primary Cardiologist: Kate Sable, MD   Preoperative team, please contact this patient and set up a phone call appointment for further preoperative risk assessment. Please obtain consent and complete medication review. Thank you for your help.  I confirm that guidance regarding antiplatelet and oral anticoagulation therapy has been completed and, if necessary, noted below (none requested).   However, patient does take ASA 81 mg daily and Plavix 75 mg daily. Per office protocol, if patient is without any new symptoms or concerns at the time of their virtual visit, he/she may hold Aspirin for 5-7  days prior to procedure and Plavix for 5 days prior to procedure. Please resume Aspirin and Plavix as soon as possible postprocedure, at the discretion of the surgeon.    Lenna Sciara, NP 11/05/2021, 4:56 PM [place]

## 2021-11-05 NOTE — Telephone Encounter (Signed)
Pt agreeable to plan of care for tele pre op appt 11/09/21 @ 10 am. Meds reviewed, consent has been given

## 2021-11-05 NOTE — Telephone Encounter (Signed)
Pt agreeable to plan of care for tele pre op appt 11/09/21 @ 10 am. Meds reviewed, consent has been given .     Patient Consent for Virtual Visit        [name] has provided verbal consent on 11/05/2021 for a virtual visit (video or telephone).   CONSENT FOR VIRTUAL VISIT FOR:  [name]  By participating in this virtual visit I agree to the following:  I hereby voluntarily request, consent and authorize [facility] and its employed or contracted physicians, physician assistants, nurse practitioners or other licensed health care professionals (the Practitioner), to provide me with telemedicine health care services (the "Services") as deemed necessary by the treating Practitioner. I acknowledge and consent to receive the Services by the Practitioner via telemedicine. I understand that the telemedicine visit will involve communicating with the Practitioner through live audiovisual communication technology and the disclosure of certain medical information by electronic transmission. I acknowledge that I have been given the opportunity to request an in-person assessment or other available alternative prior to the telemedicine visit and am voluntarily participating in the telemedicine visit.  I understand that I have the right to withhold or withdraw my consent to the use of telemedicine in the course of my care at any time, without affecting my right to future care or treatment, and that the Practitioner or I may terminate the telemedicine visit at any time. I understand that I have the right to inspect all information obtained and/or recorded in the course of the telemedicine visit and may receive copies of available information for a reasonable fee.  I understand that some of the potential risks of receiving the Services via telemedicine include:  Delay or interruption in medical evaluation due to technological equipment failure or disruption; Information transmitted may not be  sufficient (e.g. poor resolution of images) to allow for appropriate medical decision making by the Practitioner; and/or  In rare instances, security protocols could fail, causing a breach of personal health information.  Furthermore, I acknowledge that it is my responsibility to provide information about my medical history, conditions and care that is complete and accurate to the best of my ability. I acknowledge that Practitioner's advice, recommendations, and/or decision may be based on factors not within their control, such as incomplete or inaccurate data provided by me or distortions of diagnostic images or specimens that may result from electronic transmissions. I understand that the practice of medicine is not an exact science and that Practitioner makes no warranties or guarantees regarding treatment outcomes. I acknowledge that a copy of this consent can be made available to me via my patient portal ([facility]), or I can request a printed copy by calling the office of [facility].    I understand that my insurance will be billed for this visit.   I have read or had this consent read to me. I understand the contents of this consent, which adequately explains the benefits and risks of the Services being provided via telemedicine.  I have been provided ample opportunity to ask questions regarding this consent and the Services and have had my questions answered to my satisfaction. I give my informed consent for the services to be provided through the use of telemedicine in my medical care

## 2021-11-06 ENCOUNTER — Other Ambulatory Visit: Payer: Self-pay | Admitting: Urology

## 2021-11-07 NOTE — Telephone Encounter (Signed)
Salita from [facility] states the surgery is scheduled for 12/28/2021. She says they are also requesting pharmacy clearance. She says they are requesting to hold aspirin for 5 days and the plavix 5-7 days depending on cardiology recommendations. She also says the anesthesia is general.

## 2021-11-09 ENCOUNTER — Ambulatory Visit (INDEPENDENT_AMBULATORY_CARE_PROVIDER_SITE_OTHER): Payer: PPO

## 2021-11-09 ENCOUNTER — Ambulatory Visit: Payer: PPO | Attending: Cardiovascular Disease | Admitting: General Practice

## 2021-11-09 DIAGNOSIS — Z01810 Encounter for preprocedural cardiovascular examination: Secondary | ICD-10-CM

## 2021-11-09 DIAGNOSIS — Z0000 Encounter for general adult medical examination without abnormal findings: Secondary | ICD-10-CM

## 2021-11-09 NOTE — Progress Notes (Signed)
Subjective:   [name] is a 71 y.o. male who presents for Medicare Annual/Subsequent preventive examination.  Review of Systems     Cardiac Risk Factors include: advanced age (>63mn, >>56women)     Objective:    Today's Vitals   11/09/21 1439  PainSc: 2    There is no height or weight on file to calculate BMI.     10/08/2021   12:04 AM 11/30/2020   10:51 AM 11/09/2020    2:04 PM 10/04/2020    9:42 AM 11/16/2019    2:46 PM 06/20/2016    3:49 PM  Advanced Directives  Does Patient Have a Medical Advance Directive? No  No No Yes No  Type of Advance Directive     [place]  Would patient like information on creating a medical advance directive?  No - Patient declined No - Patient declined No - Patient declined  Yes (MAU/Ambulatory/Procedural Areas - Information given)    Current Medications (verified) Outpatient Encounter Medications as of 11/09/2021  Medication Sig   aspirin EC 81 MG tablet Take 81 mg by mouth daily. Swallow whole.   cetirizine (ZYRTEC) 10 MG tablet TAKE 1 TABLET BY MOUTH EVERY DAY   clopidogrel (PLAVIX) 75 MG tablet Take 1 tablet (75 mg total) by mouth daily.   ezetimibe (ZETIA) 10 MG tablet TAKE 1 TABLET BY MOUTH EVERY DAY   losartan (COZAAR) 50 MG tablet Take 0.5 tablets (25 mg total) by mouth in the morning.   metoprolol succinate (TOPROL-XL) 25 MG 24 hr tablet Take 25 mg by mouth daily.   nitroGLYCERIN (NITROSTAT) 0.4 MG SL tablet PLACE 1 TABLET UNDER THE TONGUE EVERY 5 MINUTES AS NEEDED FOR CHEST PAIN.   rosuvastatin (CRESTOR) 40 MG tablet Take 1 tablet (40 mg total) by mouth daily.   HYDROcodone-acetaminophen (NORCO) 5-325 MG tablet Take 1 tablet by mouth every 6 (six) hours as needed for moderate pain.   isosorbide mononitrate (IMDUR) 30 MG 24 hr tablet Take 0.5 tablets (15 mg total) by mouth daily.   tiZANidine (ZANAFLEX) 4 MG tablet Take 1 tablet (4 mg total) by mouth every 6 (six) hours as needed for muscle spasms.    [DISCONTINUED] pantoprazole (PROTONIX) 40 MG tablet Take 1 tablet (40 mg total) by mouth daily.   No facility-administered encounter medications on file as of 11/09/2021.    Allergies (verified) Levaquin [levofloxacin in d5w]   History: Past Medical History:  Diagnosis Date   Adenomatous colon polyp 2004   Allergy    Diverticulosis 2011   Former smoker    HLD (hyperlipidemia)    Hyperplastic colon polyp 2004   Hypertension    Melanoma in situ ([facility]    Past Surgical History:  Procedure Laterality Date   COLONOSCOPY  01/21/2009   SEVERAL    CORONARY STENT INTERVENTION N/A 10/04/2020   Procedure: CORONARY STENT INTERVENTION;  Surgeon: HLeonie Man MD;  [facility];  Service: Cardiovascular;  Laterality: N/A;   LEFT HEART CATH AND CORONARY ANGIOGRAPHY N/A 10/04/2020   Procedure: LEFT HEART CATH AND CORONARY ANGIOGRAPHY;  Surgeon: HLeonie Man MD;  [facility];  Service: Cardiovascular;  Laterality: N/A;   LEFT HEART CATH AND CORONARY ANGIOGRAPHY Left 11/30/2020   Procedure: LEFT HEART CATH AND CORONARY ANGIOGRAPHY;  Surgeon: HLeonie Man MD;  [facility];  Service: Cardiovascular;  Laterality: Left;   TOOTH EXTRACTION     TOTAL KNEE  ARTHROPLASTY Right 04/26/2020   Family History  Problem Relation Age of Onset   Heart disease Mother        died in her 70's   Lung cancer Father    Diabetes Daughter    Social History   Socioeconomic History   Marital status: Married    Spouse name: [name]   Number of children: 2   Years of education: Not on file   Highest education level: Not on file  Occupational History   Occupation: part Scientist, clinical (histocompatibility and immunogenetics): [employer]  Tobacco Use   Smoking status: Former    Packs/day: 1.00    Years: 30.00    Total pack years: 30.00    Types: Cigarettes, Cigars    Quit date: 05/13/1998    Years since quitting: 23.5   Smokeless tobacco: Current    Types: Chew  Vaping Use   Vaping  Use: Never used  Substance and Sexual Activity   Alcohol use: Yes    Alcohol/week: 3.0 standard drinks of alcohol    Types: 3 Cans of beer per week   Drug use: No   Sexual activity: Not Currently  Other Topics Concern   Not on file  Social History Narrative   Married x 51 years   2 grandsons   Social Determinants of Health   Financial Resource Strain: Low Risk  (11/09/2021)   Overall Financial Resource Strain (CARDIA)    Difficulty of Paying Living Expenses: Not hard at all  Food Insecurity: No Food Insecurity (11/09/2021)   Hunger Vital Sign    Worried About Running Out of Food in the Last Year: Never true    Ran Out of Food in the Last Year: Never true  Transportation Needs: No Transportation Needs (11/09/2021)   PRAPARE - Hydrologist (Medical): No    Lack of Transportation (Non-Medical): No  Physical Activity: Inactive (11/09/2021)   Exercise Vital Sign    Days of Exercise per Week: 0 days    Minutes of Exercise per Session: 30 min  Stress: No Stress Concern Present (11/09/2021)   [place]    Feeling of Stress : Not at all  Social Connections: Moderately Integrated (11/09/2021)   Social Connection and Isolation Panel [NHANES]    Frequency of Communication with Friends and Family: Twice a week    Frequency of Social Gatherings with Friends and Family: Twice a week    Attends Religious Services: 1 to 4 times per year    Active Member of [employer] or Organizations: No    Attends Music therapist: Never    Marital Status: Married    Tobacco Counseling Ready to quit: Not Answered Counseling given: Not Answered   Clinical Intake:  Pre-visit preparation completed: No  Pain : 0-10 Pain Score: 2  Pain Type: Other (Comment) (tendernitis on r-ankle every now and then) Pain Location: Foot Pain Orientation: Right Pain Descriptors / Indicators: Aching Pain  Frequency: Intermittent Effect of Pain on Daily Activities: none     BMI - recorded: 29.6 Nutritional Status: BMI 25 -29 Overweight Diabetes: No  How often do you need to have someone help you when you read instructions, pamphlets, or other written materials from your doctor or pharmacy?: 1 - Never What is the last grade level you completed in school?: 12  Diabetic?No         Activities of Daily Living    11/09/2021  2:57 PM 11/09/2021    2:31 PM  In your present state of health, do you have any difficulty performing the following activities:  Hearing? 0 0  Vision? 1   Comment wear glasses to see up close   Difficulty concentrating or making decisions? 0   Walking or climbing stairs? 0   Dressing or bathing? 0   Doing errands, shopping? 0   Preparing Food and eating ? N   Using the Toilet? N   In the past six months, have you accidently leaked urine? N   Do you have problems with loss of bowel control? N   Managing your Medications? N   Managing your Finances? N   Housekeeping or managing your Housekeeping? N     Patient Care Team: Susy Frizzle, MD as PCP - General (Family Medicine) Kate Sable, MD as PCP - Cardiology (Cardiology) Edythe Clarity, [facility] as Pharmacist (Pharmacist)  Indicate any recent Medical Services you may have received from other than [facility] providers in the past year (date may be approximate).     Assessment:   This is a routine wellness examination for [facility].  Hearing/Vision screen No results found.  Dietary issues and exercise activities discussed: Exercise limited by: cardiac condition(s);orthopedic condition(s)   Goals Addressed             This Visit's Progress    Exercise 3x per week (30 min per time)       Lose 10 more pounds Pt wants to stay healthy for the rest of the year       Depression Screen    11/09/2021    2:55 PM 11/09/2020    2:00 PM 11/16/2019    2:46 PM 11/27/2017    3:26 PM 03/20/2017     9:06 AM 04/09/2016    3:34 PM 08/03/2014    3:37 PM  PHQ 2/9 Scores  PHQ - 2 Score 0 0 0 0 0 0 0  PHQ- 9 Score      0     Fall Risk    11/09/2021    2:56 PM 11/05/2021    8:59 AM 11/09/2020    2:05 PM 11/16/2019    2:46 PM 12/16/2018    5:05 PM  Fall Risk   Falls in the past year? 0 0 0 0 0  Comment     Emmi Telephone Survey: data to providers prior to load  Number falls in past yr:  0 0 0   Injury with Fall?  0 0 0   Risk for fall due to :   No Fall Risks    Follow up   Falls prevention discussed      [place]:  Any stairs in or around the home? No  If so, are there any without handrails? No  Home free of loose throw rugs in walkways, pet beds, electrical cords, etc? Yes  Adequate lighting in your home to reduce risk of falls? Yes   ASSISTIVE DEVICES UTILIZED TO PREVENT FALLS:  Life alert? No  Use of a cane, walker or w/c? No  Grab bars in the bathroom? Yes  Shower chair or bench in shower? Yes  Elevated toilet seat or a handicapped toilet? No   TIMED UP AND GO:  Was the test performed?  n/a .  Length of time to ambulate 10 feet: n/a sec.    Cognitive Function:        11/09/2021    2:59  PM 11/09/2020    2:08 PM  6CIT Screen  What Year? 0 points 0 points  What month? 0 points 0 points  What time? 0 points 0 points  Count back from 20 0 points 0 points  Months in reverse 0 points 0 points  Repeat phrase 0 points 0 points  Total Score 0 points 0 points    Immunizations Immunization History  Administered Date(s) Administered   Influenza,inj,Quad PF,6+ Mos 12/16/2012, 11/03/2014   Influenza-Unspecified 10/21/2016, 10/11/2020   Pneumococcal Conjugate-13 07/01/2014   Pneumococcal Polysaccharide-23 06/19/2012, 11/30/2019   Td 06/12/2001   Tdap 05/18/2013    TDAP status: Up to date  Flu Vaccine status: Due, Education has been provided regarding the importance of this vaccine. Advised may receive this vaccine at local  pharmacy or Health Dept. Aware to provide a copy of the vaccination record if obtained from local pharmacy or Health Dept. Verbalized acceptance and understanding.  Pneumococcal vaccine status: Up to date  Covid-19 vaccine status: Declined, Education has been provided regarding the importance of this vaccine but patient still declined. Advised may receive this vaccine at local pharmacy or Health Dept.or vaccine clinic. Aware to provide a copy of the vaccination record if obtained from local pharmacy or Health Dept. Verbalized acceptance and understanding.  Qualifies for Shingles Vaccine? No   Zostavax completed No   Shingrix Completed?: No.    Education has been provided regarding the importance of this vaccine. Patient has been advised to call insurance company to determine out of pocket expense if they have not yet received this vaccine. Advised may also receive vaccine at local pharmacy or Health Dept. Verbalized acceptance and understanding.  Screening Tests Health Maintenance  Topic Date Due   COVID-19 Vaccine (1) 11/20/2021 (Originally 06/12/1951)   COLONOSCOPY (Pts 45-37yr Insurance coverage will need to be confirmed)  01/10/2022 (Originally 11/30/2019)   INFLUENZA VACCINE  04/21/2022 (Originally 08/21/2021)   Zoster Vaccines- Shingrix (1 of 2) 11/14/2022 (Originally 12/14/1998)   TETANUS/TDAP  05/19/2023   Pneumonia Vaccine 71 Years old  Completed   Hepatitis C Screening  Completed   HPV VACCINES  Aged Out    Health Maintenance  There are no preventive care reminders to display for this patient.  Colorectal cancer screening: Referral to GI placed will send Cologuard. Pt aware the office will call re: appt.  Lung Cancer Screening: (Low Dose CT Chest recommended if Age 542-80years, 30 pack-year currently smoking OR have quit w/in 15years.) does not qualify.   Lung Cancer Screening Referral: n/a  Additional Screening:  Hepatitis C Screening: does not qualify; Completed  n/a  Vision Screening: Recommended annual ophthalmology exams for early detection of glaucoma and other disorders of the eye. Is the patient up to date with their annual eye exam?  Yes  Who is the provider or what is the name of the office in which the patient attends annual eye exams? [facility] If pt is not established with a provider, would they like to be referred to a provider to establish care? No .   Dental Screening: Recommended annual dental exams for proper oral hygiene  Community Resource Referral / Chronic Care Management: CRR required this visit?  No   CCM required this visit?  No      Plan:     I have personally reviewed and noted the following in the patient's chart:   Medical and social history Use of alcohol, tobacco or illicit drugs  Current medications and  supplements including opioid prescriptions. Patient is currently taking opioid prescriptions. Information provided to patient regarding non-opioid alternatives. Patient advised to discuss non-opioid treatment plan with their provider. Functional ability and status Nutritional status Physical activity Advanced directives List of other physicians Hospitalizations, surgeries, and ER visits in previous 12 months Vitals Screenings to include cognitive, depression, and falls Referrals and appointments  In addition, I have reviewed and discussed with patient certain preventive protocols, quality metrics, and best practice recommendations. A written personalized care plan for preventive services as well as general preventive health recommendations were provided to patient.     Colman Cater, [place]   11/09/2021   Nurse Notes: n/a

## 2021-11-09 NOTE — Progress Notes (Signed)
Virtual Visit via Telephone Note   Because of [name]'s co-morbid illnesses, he is at least at moderate risk for complications without adequate follow up.  This format is felt to be most appropriate for this patient at this time.  The patient did not have access to video technology/had technical difficulties with video requiring transitioning to audio format only (telephone).  All issues noted in this document were discussed and addressed.  No physical exam could be performed with this format.  Please refer to the patient's chart for his consent to telehealth for [facility].  Evaluation Performed:  Preoperative cardiovascular risk assessment _____________   Date:  11/09/2021   Patient ID:  [name], DOB 06-Apr-1950, MRN [id number] Patient Location:  Home Provider location:   Office  Primary Care Provider:  Susy Frizzle, MD Primary Cardiologist:  Kate Sable, MD  Chief Complaint / Patient Profile   71 y.o. y/o male with a h/o CAD, HLD, HTN who is pending RADICAL PROSTATETECTOMY /NODE Rose and presents today for telephonic preoperative cardiovascular risk assessment.  Past Medical History    Past Medical History:  Diagnosis Date   Adenomatous colon polyp 2004   Allergy    Diverticulosis 2011   Former smoker    HLD (hyperlipidemia)    Hyperplastic colon polyp 2004   Hypertension    Melanoma in situ [facility])    Past Surgical History:  Procedure Laterality Date   COLONOSCOPY  01/21/2009   SEVERAL    CORONARY STENT INTERVENTION N/A 10/04/2020   Procedure: CORONARY STENT INTERVENTION;  Surgeon: Leonie Man, MD;  Location: [place] [facility];  Service: Cardiovascular;  Laterality: N/A;   LEFT HEART CATH AND CORONARY ANGIOGRAPHY N/A 10/04/2020   Procedure: LEFT HEART CATH AND CORONARY ANGIOGRAPHY;  Surgeon: Leonie Man, MD;  Location: [place] [facility];  Service: Cardiovascular;  Laterality: N/A;   LEFT HEART CATH  AND CORONARY ANGIOGRAPHY Left 11/30/2020   Procedure: LEFT HEART CATH AND CORONARY ANGIOGRAPHY;  Surgeon: Leonie Man, MD;  Location: [place] [facility];  Service: Cardiovascular;  Laterality: Left;   TOOTH EXTRACTION     TOTAL KNEE ARTHROPLASTY Right 04/26/2020    Allergies  Allergies  Allergen Reactions   Levaquin [Levofloxacin In D5w] Shortness Of Breath    History of Present Illness    [name] is a 71 y.o. male who presents via audio/video conferencing for a telehealth visit today.  Pt was last seen in the cardiology clinic on 06/11/21 by Dr. Garen Lah .  At that time [name] was doing well .  The patient is now pending procedure as outlined above. Since his last visit, he remains stable from a cardiac standpoint.  Today he denies chest pain, shortness of breath, lower extremity edema, fatigue, palpitations, melena, hematuria, hemoptysis, diaphoresis, weakness, presyncope, syncope, orthopnea, and PND.    Home Medications    Prior to Admission medications   Medication Sig Start Date End Date Taking? Authorizing Provider  aspirin EC 81 MG tablet Take 81 mg by mouth daily. Swallow whole.    [provider]  cetirizine (ZYRTEC) 10 MG tablet TAKE 1 TABLET BY MOUTH EVERY DAY 11/27/20   Susy Frizzle, MD  clopidogrel (PLAVIX) 75 MG tablet Take 1 tablet (75 mg total) by mouth daily. 11/05/21   Kate Sable, MD  ezetimibe (ZETIA) 10 MG tablet TAKE 1 TABLET BY MOUTH EVERY DAY 04/09/21   Susy Frizzle, MD  HYDROcodone-acetaminophen (NORCO) 5-325 MG tablet Take 1 tablet by mouth every 6 (six) hours as needed for moderate pain. 10/04/21   Susy Frizzle, MD  isosorbide mononitrate (IMDUR) 30 MG 24 hr tablet Take 0.5 tablets (15 mg total) by mouth daily. 12/11/20 11/05/21  Kate Sable, MD  losartan (COZAAR) 50 MG tablet Take 0.5 tablets (25 mg total) by mouth in the morning. 06/11/21   Kate Sable, MD  metoprolol succinate (TOPROL-XL) 25 MG  24 hr tablet Take 25 mg by mouth daily. 05/31/21   [provider]  nitroGLYCERIN (NITROSTAT) 0.4 MG SL tablet PLACE 1 TABLET UNDER THE TONGUE EVERY 5 MINUTES AS NEEDED FOR CHEST PAIN. 10/01/21   Susy Frizzle, MD  pantoprazole (PROTONIX) 40 MG tablet Take 1 tablet (40 mg total) by mouth daily. 07/02/21   Kate Sable, MD  rosuvastatin (CRESTOR) 40 MG tablet Take 1 tablet (40 mg total) by mouth daily. 10/17/21   Kate Sable, MD  tiZANidine (ZANAFLEX) 4 MG tablet Take 1 tablet (4 mg total) by mouth every 6 (six) hours as needed for muscle spasms. 03/05/21   Susy Frizzle, MD    Physical Exam    Vital Signs:  [name] does not have vital signs available for review today.  Given telephonic nature of communication, physical exam is limited. AAOx3. NAD. Normal affect.  Speech and respirations are unlabored.  Accessory Clinical Findings    None  Assessment & Plan    1.  Preoperative Cardiovascular Risk Assessment: Radical prostatectomy/node dissection with curative intent; [facility]; Dr. Alexis Frock      Primary Cardiologist: Kate Sable, MD  Chart reviewed as part of pre-operative protocol coverage. Given past medical history and time since last visit, based on ACC/AHA guidelines, [name] would be at acceptable risk for the planned procedure without further cardiovascular testing.   Patient was advised that if he develops new symptoms prior to surgery to contact our office to arrange a follow-up appointment.  He verbalized understanding.  I will route this recommendation to the requesting party via Epic fax function and remove from pre-op pool.   His aspirin may be held for 5 to 7 days prior to his procedure and his Plavix may be held for 5 days prior to his procedure.  Please resume both aspirin and Plavix as soon as hemostasis is achieved at the discretion of the surgeon.   Time:   Today, I have spent 6 minutes with the  patient with telehealth technology discussing medical history, symptoms, and management plan.  Prior to this phone evaluation spent greater than 10 minutes reviewing his past medical history and cardiac conditions.   Deberah Pelton, NP  11/09/2021, 7:46 AM

## 2021-12-05 ENCOUNTER — Other Ambulatory Visit: Payer: Self-pay | Admitting: Family Medicine

## 2021-12-06 ENCOUNTER — Other Ambulatory Visit: Payer: Self-pay | Admitting: Family Medicine

## 2021-12-12 NOTE — Progress Notes (Addendum)
COVID Vaccine Completed: yes  Date of COVID positive in last 18 days:no  PCP - Lynnea Ferrier, MD Cardiologist - [name], MD  Cardiac clearance by Edd Fabian 11/09/21 in Epic  Chest x-ray - 10/08/21 Epic EKG - 10/09/21 Epic Stress Test - more than 10 years per pt ECHO - 09/30/20 Epic Cardiac Cath - 11/30/20 Epic Pacemaker/ICD device last checked: n/a Spinal Cord Stimulator: n/a  Bowel Prep - mag citrate, clears day before  Sleep Study - no per pt CPAP -   Fasting Blood Sugar - n/a Checks Blood Sugar _____ times a day  Last dose of GLP1 agonist-  N/A GLP1 instructions:  N/A   Last dose of SGLT-2 inhibitors-  N/A SGLT-2 instructions: N/A   Blood Thinner Instructions: Plavix, hold 5 days Aspirin Instructions: ASA 81, pt reports he hasn't  taken in 3-4 weeks Last Dose: 12/22/21 0800  Activity level: Can go up a flight of stairs and perform activities of daily living without stopping and without symptoms of chest pain or shortness of breath.   Anesthesia review: HTN, CAD stent x1, COPD  Patient denies shortness of breath, fever, cough and chest pain at PAT appointment  Patient verbalized understanding of instructions that were given to them at the PAT appointment. Patient was also instructed that they will need to review over the PAT instructions again at home before surgery.

## 2021-12-17 NOTE — Patient Instructions (Signed)
SURGICAL WAITING ROOM VISITATION Patients having surgery or a procedure may have no more than 2 support people in the waiting area - these visitors may rotate.   Children under the age of 38 must have an adult with them who is not the patient. If the patient needs to stay at the hospital during part of their recovery, the visitor guidelines for inpatient rooms apply. Pre-op nurse will coordinate an appropriate time for 1 support person to accompany patient in pre-op.  This support person may not rotate.    Please refer to the [facility] website for the visitor guidelines for Inpatients (after your surgery is over and you are in a regular room).    Your procedure is scheduled on: 12/28/21   Report to [facility] Main Entrance    Report to admitting at 5:15 AM   Call this number if you have problems the morning of surgery [phone]   Follow a clear liquid diet the day before surgery.   You may have the following liquids until midnight the night before surgery.  Water Non-Citrus Juices (without pulp, NO RED) Carbonated Beverages Black Coffee (NO MILK/CREAM OR CREAMERS, sugar ok)  Clear Tea (NO MILK/CREAM OR CREAMERS, sugar ok) regular and decaf                             Plain Jell-O (NO RED)                                           Fruit ices (not with fruit pulp, NO RED)                                     Popsicles (NO RED)                                                               Sports drinks like Gatorade (NO RED)                      If you have questions, please contact your surgeon's office.   FOLLOW BOWEL PREP AND ANY ADDITIONAL PRE OP INSTRUCTIONS YOU RECEIVED FROM YOUR SURGEON'S OFFICE!!!     Oral Hygiene is also important to reduce your risk of infection.                                    Remember - BRUSH YOUR TEETH THE MORNING OF SURGERY WITH YOUR REGULAR TOOTHPASTE   Take these medicines the morning of surgery with A SIP OF WATER: Zyrtec, Zetia,  Isosorbide, Metoprolol, Rosuvastatin                              You may not have any metal on your body including jewelry, and body piercing             Do not wear lotions, powders, cologne, or deodorant  Men may shave face and neck.   Do not bring valuables to the hospital. [place].   Bring small overnight bag day of surgery.   DO NOT [place]. PHARMACY WILL DISPENSE MEDICATIONS LISTED ON YOUR MEDICATION LIST TO YOU DURING YOUR ADMISSION [place]!              Please read over the following fact sheets you were given: IF [place] [phone] Schneiders   If you received a COVID test during your pre-op visit  it is requested that you wear a mask when out in public, stay away from anyone that may not be feeling well and notify your surgeon if you develop symptoms. If you test positive for Covid or have been in contact with anyone that has tested positive in the last 10 days please notify you surgeon.    [place] - Preparing for Surgery Before surgery, you can play an important role.  Because skin is not sterile, your skin needs to be as free of germs as possible.  You can reduce the number of germs on your skin by washing with CHG (chlorahexidine gluconate) soap before surgery.  CHG is an antiseptic cleaner which kills germs and bonds with the skin to continue killing germs even after washing. Please DO NOT use if you have an allergy to CHG or antibacterial soaps.  If your skin becomes reddened/irritated stop using the CHG and inform your nurse when you arrive at Short Stay. Do not shave (including legs and underarms) for at least 48 hours prior to the first CHG shower.  You may shave your face/neck.  Please follow these instructions carefully:  1.  Shower with CHG Soap the night before surgery and the  morning of surgery.  2.  If  you choose to wash your hair, wash your hair first as usual with your normal  shampoo.  3.  After you shampoo, rinse your hair and body thoroughly to remove the shampoo.                             4.  Use CHG as you would any other liquid soap.  You can apply chg directly to the skin and wash.  Gently with a scrungie or clean washcloth.  5.  Apply the CHG Soap to your body ONLY FROM THE NECK DOWN.   Do   not use on face/ open                           Wound or open sores. Avoid contact with eyes, ears mouth and   genitals (private parts).                       Wash face,  Genitals (private parts) with your normal soap.             6.  Wash thoroughly, paying special attention to the area where your    surgery  will be performed.  7.  Thoroughly rinse your body with warm water from the neck down.  8.  DO NOT shower/wash with your normal soap after using and rinsing off the CHG Soap.  9.  Pat yourself dry with a clean towel.            10.  Wear clean pajamas.            11.  Place clean sheets on your bed the night of your first shower and do not  sleep with pets. Day of Surgery : Do not apply any lotions/deodorants the morning of surgery.  Please wear clean clothes to the hospital/surgery center.  FAILURE TO FOLLOW THESE INSTRUCTIONS MAY RESULT IN THE CANCELLATION OF YOUR SURGERY  PATIENT SIGNATURE_________________________________  NURSE SIGNATURE__________________________________  ________________________________________________________________________

## 2021-12-18 ENCOUNTER — Encounter (HOSPITAL_COMMUNITY): Payer: Self-pay

## 2021-12-18 ENCOUNTER — Encounter (HOSPITAL_COMMUNITY)
Admission: RE | Admit: 2021-12-18 | Discharge: 2021-12-18 | Disposition: A | Discharge status: Home / Self Care | Payer: PPO | Source: Ambulatory Visit | Attending: Urology | Admitting: Urology

## 2021-12-18 VITALS — BP 141/85 | HR 64 | Temp 97.8°F | Resp 12 | Ht 72.0 in | Wt 213.0 lb

## 2021-12-18 DIAGNOSIS — I25119 Atherosclerotic heart disease of native coronary artery with unspecified angina pectoris: Secondary | ICD-10-CM | POA: Insufficient documentation

## 2021-12-18 DIAGNOSIS — Z01812 Encounter for preprocedural laboratory examination: Secondary | ICD-10-CM | POA: Insufficient documentation

## 2021-12-18 DIAGNOSIS — Z01818 Encounter for other preprocedural examination: Secondary | ICD-10-CM | POA: Diagnosis present

## 2021-12-18 HISTORY — DX: Gastro-esophageal reflux disease without esophagitis: K21.9

## 2021-12-18 HISTORY — DX: Unspecified osteoarthritis, unspecified site: M19.90

## 2021-12-18 LAB — BASIC METABOLIC PANEL
Anion gap: 8 (ref 5–15)
BUN: 21 mg/dL (ref 8–23)
CO2: 25 mmol/L (ref 22–32)
Calcium: 9.1 mg/dL (ref 8.9–10.3)
Chloride: 108 mmol/L (ref 98–111)
Creatinine, Ser: 1.33 mg/dL — ABNORMAL HIGH (ref 0.61–1.24)
GFR, Estimated: 56 mL/min — ABNORMAL LOW (ref >60)
Glucose, Bld: 106 mg/dL — ABNORMAL HIGH (ref 70–99)
Potassium: 4.8 mmol/L (ref 3.5–5.1)
Sodium: 141 mmol/L (ref 135–145)

## 2021-12-18 LAB — CBC
HCT: 44.5 % (ref 39.0–52.0)
Hemoglobin: 14.4 g/dL (ref 13.0–17.0)
MCH: 28.4 pg (ref 26.0–34.0)
MCHC: 32.4 g/dL (ref 30.0–36.0)
MCV: 87.8 fL (ref 80.0–100.0)
Platelets: 271 10*3/uL (ref 150–400)
RBC: 5.07 MIL/uL (ref 4.22–5.81)
RDW: 13.2 % (ref 11.5–15.5)
WBC: 7.7 10*3/uL (ref 4.0–10.5)
nRBC: 0 % (ref 0.0–0.2)

## 2021-12-20 DIAGNOSIS — N401 Enlarged prostate with lower urinary tract symptoms: Secondary | ICD-10-CM | POA: Diagnosis not present

## 2021-12-20 DIAGNOSIS — R3914 Feeling of incomplete bladder emptying: Secondary | ICD-10-CM | POA: Diagnosis not present

## 2021-12-20 DIAGNOSIS — N403 Nodular prostate with lower urinary tract symptoms: Secondary | ICD-10-CM | POA: Diagnosis not present

## 2021-12-20 NOTE — Anesthesia Preprocedure Evaluation (Addendum)
Anesthesia Evaluation  Patient identified by MRN, date of birth, ID band Patient awake    Reviewed: Allergy & Precautions, NPO status , Patient's Chart, lab work & pertinent test results  Airway Mallampati: II  TM Distance: >3 FB Neck ROM: Full    Dental no notable dental hx. (+) Teeth Intact, Dental Advisory Given   Pulmonary former smoker   Pulmonary exam normal breath sounds clear to auscultation       Cardiovascular hypertension, + CAD and + Cardiac Stents (09/2020)  Normal cardiovascular exam Rhythm:Regular Rate:Normal  09/2020 TTE  1. Left ventricular ejection fraction, by estimation, is 60 to 65%. The  left ventricle has normal function. The left ventricle has no regional  wall motion abnormalities. The left ventricular internal cavity size was  mildly dilated. Left ventricular  diastolic parameters are consistent with Grade I diastolic dysfunction  (impaired relaxation).   2. Right ventricular systolic function is normal. The right ventricular  size is normal. There is normal pulmonary artery systolic pressure. The  estimated right ventricular systolic pressure is 96.2 mmHg.   3. The mitral valve is normal in structure. Mild mitral valve  regurgitation. No evidence of mitral stenosis.   4. The aortic valve is normal in structure. Aortic valve regurgitation is  mild. Mild to moderate aortic valve sclerosis/calcification is present,  without any evidence of aortic stenosis.   5. The inferior vena cava is normal in size with greater than 50%  respiratory variability, suggesting right atrial pressure of 3 mmHg.     Neuro/Psych    GI/Hepatic Neg liver ROS,GERD  ,,  Endo/Other    Renal/GU Lab Results      Component                Value               Date                      CREATININE               1.33 (H)            12/18/2021                BUN                      21                  12/18/2021                NA                        141                 12/18/2021                K                        4.8                 12/18/2021                CL                       108                 12/18/2021  CO2                      25                  12/18/2021              Prostate CA    Musculoskeletal  (+) Arthritis ,    Abdominal   Peds  Hematology negative hematology ROS (+) Lab Results      Component                Value               Date                      WBC                      7.7                 12/18/2021                HGB                      14.4                12/18/2021                HCT                      44.5                12/18/2021                MCV                      87.8                12/18/2021                PLT                      271                 12/18/2021              Anesthesia Other Findings All: Levaquin  Reproductive/Obstetrics                             Anesthesia Physical Anesthesia Plan  ASA: 3  Anesthesia Plan: General   Post-op Pain Management: Lidocaine infusion* and Ketamine IV*   Induction: Intravenous  PONV Risk Score and Plan: 3 and Treatment may vary due to age or medical condition, Ondansetron and Dexamethasone  Airway Management Planned: Oral ETT  Additional Equipment: None  Intra-op Plan:   Post-operative Plan: Extubation in OR  Informed Consent: I have reviewed the patients History and Physical, chart, labs and discussed the procedure including the risks, benefits and alternatives for the proposed anesthesia with the patient or authorized representative who has indicated his/her understanding and acceptance.     Dental advisory given  Plan Discussed with:   Anesthesia Plan Comments: (See PAT note 10/18/2021)       Anesthesia Quick Evaluation

## 2021-12-20 NOTE — Progress Notes (Signed)
Anesthesia Chart Review   Case: [id number] Date/Time: 12/28/21 0715   Procedures:      XI ROBOTIC ASSISTED LAPAROSCOPIC RADICAL PROSTATECTOMY AND INDOCYANINE GREEN DYE INJECTION - 3 HRS     PELVIC LYMPH NODE DISSECTION (Bilateral)   Anesthesia type: Choice   Pre-op diagnosis: SEMINAL VESICAL MASS, PROSTATE CANCER   Location: [facility] ROOM 03 / [facility]   Surgeons: Alexis Frock, MD       DISCUSSION:71 y.o. former smoker with h/o HTN, CAD s/p PCI p LAD 09/2020, seminal vesical mass, prostate cancer scheduled for above procedure 12/28/2021 with Dr. Alexis Frock.   Pt seen by cardiology 11/09/2021. Per OV note, "Chart reviewed as part of pre-operative protocol coverage. Given past medical history and time since last visit, based on ACC/AHA guidelines, [name] would be at acceptable risk for the planned procedure without further cardiovascular testing.    Patient was advised that if he develops new symptoms prior to surgery to contact our office to arrange a follow-up appointment.  He verbalized understanding.   I will route this recommendation to the requesting party via Epic fax function and remove from pre-op pool.   His aspirin may be held for 5 to 7 days prior to his procedure and his Plavix may be held for 5 days prior to his procedure.  Please resume both aspirin and Plavix as soon as hemostasis is achieved at the discretion of the surgeon."   VS: BP (!) 141/85   Pulse 64   Temp 36.6 C (Oral)   Resp 12   Ht 6' (1.829 m)   Wt 96.6 kg   SpO2 96%   BMI 28.89 kg/m   PROVIDERS: Susy Frizzle, MD is PCP   Primary Cardiologist:  Kate Sable, MD  LABS: Labs reviewed: Acceptable for surgery. (all labs ordered are listed, but only abnormal results are displayed)  Labs Reviewed  BASIC METABOLIC PANEL - Abnormal; Notable for the following components:      Result Value   Glucose, Bld 106 (*)    Creatinine, Ser 1.33 (*)    GFR, Estimated 56 (*)    All other components  within normal limits  CBC     IMAGES:   EKG:   CV: Cardiac Cath 11/30/2020   Prox LAD to Mid LAD stent is widely patent   Dist LAD lesion is 45% stenosed.   Prox RCA lesion is 75% stenosed.  Small nondominant vessel   The left ventricular systolic function is normal.   LV end diastolic pressure is normal.   There is no aortic valve stenosis.   Widely patent LAD stent with moderate stenosis of small non-dominant RCA (not PCI target). Otherwise minimal CAD in distal LAD & normal LCx-OM-LPL-PDA Normal LV size and function.  Normal EDP.   Recommendation: Consider nonischemic etiology for chest pain.  Echo 09/28/2020 1. Left ventricular ejection fraction, by estimation, is 60 to 65%. The  left ventricle has normal function. The left ventricle has no regional  wall motion abnormalities. The left ventricular internal cavity size was  mildly dilated. Left ventricular  diastolic parameters are consistent with Grade I diastolic dysfunction  (impaired relaxation).   2. Right ventricular systolic function is normal. The right ventricular  size is normal. There is normal pulmonary artery systolic pressure. The  estimated right ventricular systolic pressure is 18.5 mmHg.   3. The mitral valve is normal in structure. Mild mitral valve  regurgitation. No evidence of mitral stenosis.   4. The aortic  valve is normal in structure. Aortic valve regurgitation is  mild. Mild to moderate aortic valve sclerosis/calcification is present,  without any evidence of aortic stenosis.   5. The inferior vena cava is normal in size with greater than 50%  respiratory variability, suggesting right atrial pressure of 3 mmHg.  Past Medical History:  Diagnosis Date   Adenomatous colon polyp 2004   Allergy    Arthritis    Diverticulosis 2011   Former smoker    GERD (gastroesophageal reflux disease)    HLD (hyperlipidemia)    Hyperplastic colon polyp 2004   Hypertension    Melanoma in situ [facility])      Past Surgical History:  Procedure Laterality Date   COLONOSCOPY  01/21/2009   SEVERAL    CORONARY STENT INTERVENTION N/A 10/04/2020   Procedure: CORONARY STENT INTERVENTION;  Surgeon: Leonie Man, MD;  Location: [place] [facility];  Service: Cardiovascular;  Laterality: N/A;   LEFT HEART CATH AND CORONARY ANGIOGRAPHY N/A 10/04/2020   Procedure: LEFT HEART CATH AND CORONARY ANGIOGRAPHY;  Surgeon: Leonie Man, MD;  Location: [place] [facility];  Service: Cardiovascular;  Laterality: N/A;   LEFT HEART CATH AND CORONARY ANGIOGRAPHY Left 11/30/2020   Procedure: LEFT HEART CATH AND CORONARY ANGIOGRAPHY;  Surgeon: Leonie Man, MD;  Location: [place] [facility];  Service: Cardiovascular;  Laterality: Left;   TOOTH EXTRACTION     TOTAL KNEE ARTHROPLASTY Right 04/26/2020    MEDICATIONS:  aspirin EC 81 MG tablet   cetirizine (ZYRTEC) 10 MG tablet   clopidogrel (PLAVIX) 75 MG tablet   ezetimibe (ZETIA) 10 MG tablet   HYDROcodone-acetaminophen (NORCO) 5-325 MG tablet   isosorbide mononitrate (IMDUR) 30 MG 24 hr tablet   losartan (COZAAR) 50 MG tablet   metoprolol succinate (TOPROL-XL) 25 MG 24 hr tablet   nitroGLYCERIN (NITROSTAT) 0.4 MG SL tablet   rosuvastatin (CRESTOR) 40 MG tablet   tiZANidine (ZANAFLEX) 4 MG tablet   No current facility-administered medications for this encounter.   [name], PA-C [facility] Pre-Surgical Testing [phone]

## 2021-12-21 ENCOUNTER — Ambulatory Visit: Payer: PPO | Admitting: Cardiology

## 2021-12-28 ENCOUNTER — Ambulatory Visit (HOSPITAL_BASED_OUTPATIENT_CLINIC_OR_DEPARTMENT_OTHER): Payer: PPO | Admitting: Certified Registered Nurse Anesthetist

## 2021-12-28 ENCOUNTER — Encounter (HOSPITAL_COMMUNITY)
Admission: RE | Disposition: A | Discharge status: Home / Self Care | Payer: Self-pay | Source: Home / Self Care | Attending: Urology

## 2021-12-28 ENCOUNTER — Observation Stay (HOSPITAL_COMMUNITY)
Admission: RE | Admit: 2021-12-28 | Discharge: 2021-12-29 | Disposition: A | Discharge status: Home / Self Care | Payer: PPO | Attending: Urology | Admitting: Urology

## 2021-12-28 ENCOUNTER — Other Ambulatory Visit: Payer: Self-pay

## 2021-12-28 ENCOUNTER — Encounter (HOSPITAL_COMMUNITY): Payer: Self-pay | Admitting: Urology

## 2021-12-28 ENCOUNTER — Ambulatory Visit (HOSPITAL_COMMUNITY): Payer: PPO | Admitting: Physician Assistant

## 2021-12-28 DIAGNOSIS — I2510 Atherosclerotic heart disease of native coronary artery without angina pectoris: Secondary | ICD-10-CM | POA: Diagnosis not present

## 2021-12-28 DIAGNOSIS — I10 Essential (primary) hypertension: Secondary | ICD-10-CM | POA: Diagnosis not present

## 2021-12-28 DIAGNOSIS — Z87891 Personal history of nicotine dependence: Secondary | ICD-10-CM | POA: Diagnosis not present

## 2021-12-28 DIAGNOSIS — N490 Inflammatory disorders of seminal vesicle: Secondary | ICD-10-CM | POA: Insufficient documentation

## 2021-12-28 DIAGNOSIS — N5089 Other specified disorders of the male genital organs: Secondary | ICD-10-CM

## 2021-12-28 DIAGNOSIS — Z79899 Other long term (current) drug therapy: Secondary | ICD-10-CM | POA: Diagnosis not present

## 2021-12-28 DIAGNOSIS — Z955 Presence of coronary angioplasty implant and graft: Secondary | ICD-10-CM | POA: Diagnosis not present

## 2021-12-28 DIAGNOSIS — I25119 Atherosclerotic heart disease of native coronary artery with unspecified angina pectoris: Secondary | ICD-10-CM

## 2021-12-28 DIAGNOSIS — Z7982 Long term (current) use of aspirin: Secondary | ICD-10-CM | POA: Insufficient documentation

## 2021-12-28 DIAGNOSIS — Z96651 Presence of right artificial knee joint: Secondary | ICD-10-CM | POA: Insufficient documentation

## 2021-12-28 DIAGNOSIS — C637 Malignant neoplasm of other specified male genital organs: Secondary | ICD-10-CM | POA: Diagnosis not present

## 2021-12-28 DIAGNOSIS — C61 Malignant neoplasm of prostate: Secondary | ICD-10-CM | POA: Diagnosis not present

## 2021-12-28 DIAGNOSIS — Z7902 Long term (current) use of antithrombotics/antiplatelets: Secondary | ICD-10-CM | POA: Insufficient documentation

## 2021-12-28 HISTORY — PX: LYMPH NODE DISSECTION: SHX5087

## 2021-12-28 HISTORY — PX: ROBOT ASSISTED LAPAROSCOPIC RADICAL PROSTATECTOMY: SHX5141

## 2021-12-28 LAB — HEMOGLOBIN AND HEMATOCRIT, BLOOD
HCT: 39.6 % (ref 39.0–52.0)
Hemoglobin: 12.6 g/dL — ABNORMAL LOW (ref 13.0–17.0)

## 2021-12-28 SURGERY — PROSTATECTOMY, RADICAL, ROBOT-ASSISTED, LAPAROSCOPIC
Anesthesia: General | Site: Abdomen

## 2021-12-28 MED ORDER — STERILE WATER FOR IRRIGATION IR SOLN
Status: DC | PRN
  Administered 2021-12-28: 1000 mL

## 2021-12-28 MED ORDER — EPHEDRINE SULFATE-NACL 50-0.9 MG/10ML-% IV SOSY
PREFILLED_SYRINGE | INTRAVENOUS | Status: DC | PRN
  Administered 2021-12-28: 5 mg via INTRAVENOUS
  Administered 2021-12-28: 10 mg via INTRAVENOUS

## 2021-12-28 MED ORDER — FENTANYL CITRATE (PF) 100 MCG/2ML IJ SOLN
INTRAMUSCULAR | Status: AC
  Filled 2021-12-28: qty 2

## 2021-12-28 MED ORDER — HYDROMORPHONE HCL 1 MG/ML IJ SOLN
INTRAMUSCULAR | Status: AC
  Administered 2021-12-28: 0.5 mg via INTRAVENOUS
  Filled 2021-12-28: qty 1

## 2021-12-28 MED ORDER — LOSARTAN POTASSIUM 25 MG PO TABS
25.0000 mg | ORAL_TABLET | Freq: Every day | ORAL | Status: DC
  Administered 2021-12-29: 25 mg via ORAL
  Filled 2021-12-28: qty 1

## 2021-12-28 MED ORDER — ROCURONIUM BROMIDE 10 MG/ML (PF) SYRINGE
PREFILLED_SYRINGE | INTRAVENOUS | Status: DC | PRN
  Administered 2021-12-28 (×3): 10 mg via INTRAVENOUS
  Administered 2021-12-28: 60 mg via INTRAVENOUS

## 2021-12-28 MED ORDER — BUPIVACAINE LIPOSOME 1.3 % IJ SUSP
INTRAMUSCULAR | Status: DC | PRN
  Administered 2021-12-28: 20 mL

## 2021-12-28 MED ORDER — DEXAMETHASONE SODIUM PHOSPHATE 10 MG/ML IJ SOLN
INTRAMUSCULAR | Status: DC | PRN
  Administered 2021-12-28: 10 mg via INTRAVENOUS

## 2021-12-28 MED ORDER — ISOSORBIDE MONONITRATE ER 30 MG PO TB24
15.0000 mg | ORAL_TABLET | Freq: Every day | ORAL | Status: DC
  Administered 2021-12-29: 15 mg via ORAL
  Filled 2021-12-28: qty 1

## 2021-12-28 MED ORDER — LIDOCAINE 2% (20 MG/ML) 5 ML SYRINGE
INTRAMUSCULAR | Status: DC | PRN
  Administered 2021-12-28 (×2): 80 mg via INTRAVENOUS

## 2021-12-28 MED ORDER — ONDANSETRON HCL 4 MG/2ML IJ SOLN
INTRAMUSCULAR | Status: AC
  Administered 2021-12-28: 4 mg via INTRAVENOUS
  Filled 2021-12-28: qty 2

## 2021-12-28 MED ORDER — LACTATED RINGERS IV SOLN: INTRAVENOUS | Status: DC | PRN

## 2021-12-28 MED ORDER — SODIUM CHLORIDE (PF) 0.9 % IJ SOLN
INTRAMUSCULAR | Status: AC
  Filled 2021-12-28: qty 20

## 2021-12-28 MED ORDER — HYOSCYAMINE SULFATE 0.125 MG SL SUBL: 0.1250 mg | SUBLINGUAL_TABLET | SUBLINGUAL | Status: DC | PRN

## 2021-12-28 MED ORDER — SUGAMMADEX SODIUM 200 MG/2ML IV SOLN
INTRAVENOUS | Status: DC | PRN
  Administered 2021-12-28: 200 mg via INTRAVENOUS

## 2021-12-28 MED ORDER — TRIPLE ANTIBIOTIC 3.5-400-5000 EX OINT: 1.0000 | TOPICAL_OINTMENT | Freq: Three times a day (TID) | CUTANEOUS | Status: DC | PRN

## 2021-12-28 MED ORDER — ROSUVASTATIN CALCIUM 20 MG PO TABS
40.0000 mg | ORAL_TABLET | Freq: Every day | ORAL | Status: DC
  Administered 2021-12-29: 40 mg via ORAL
  Filled 2021-12-28: qty 2

## 2021-12-28 MED ORDER — ONDANSETRON HCL 4 MG/2ML IJ SOLN
INTRAMUSCULAR | Status: DC | PRN
  Administered 2021-12-28: 4 mg via INTRAVENOUS

## 2021-12-28 MED ORDER — LACTATED RINGERS IR SOLN
Status: DC | PRN
  Administered 2021-12-28: 1000 mL

## 2021-12-28 MED ORDER — MIDAZOLAM HCL 5 MG/5ML IJ SOLN
INTRAMUSCULAR | Status: DC | PRN
  Administered 2021-12-28: 2 mg via INTRAVENOUS

## 2021-12-28 MED ORDER — DEXMEDETOMIDINE HCL IN NACL 80 MCG/20ML IV SOLN
INTRAVENOUS | Status: AC
  Filled 2021-12-28: qty 20

## 2021-12-28 MED ORDER — ACETAMINOPHEN 10 MG/ML IV SOLN: 1000.0000 mg | Freq: Once | INTRAVENOUS | Status: DC | PRN

## 2021-12-28 MED ORDER — ROCURONIUM BROMIDE 10 MG/ML (PF) SYRINGE
PREFILLED_SYRINGE | INTRAVENOUS | Status: AC
  Filled 2021-12-28: qty 10

## 2021-12-28 MED ORDER — PHENYLEPHRINE HCL-NACL 20-0.9 MG/250ML-% IV SOLN
INTRAVENOUS | Status: AC
  Filled 2021-12-28: qty 500

## 2021-12-28 MED ORDER — FENTANYL CITRATE (PF) 100 MCG/2ML IJ SOLN
INTRAMUSCULAR | Status: DC | PRN
  Administered 2021-12-28 (×2): 50 ug via INTRAVENOUS
  Administered 2021-12-28: 25 ug via INTRAVENOUS
  Administered 2021-12-28: 50 ug via INTRAVENOUS
  Administered 2021-12-28 (×3): 25 ug via INTRAVENOUS

## 2021-12-28 MED ORDER — PROPOFOL 10 MG/ML IV BOLUS
INTRAVENOUS | Status: DC | PRN
  Administered 2021-12-28: 120 mg via INTRAVENOUS

## 2021-12-28 MED ORDER — DOCUSATE SODIUM 100 MG PO CAPS
100.0000 mg | ORAL_CAPSULE | Freq: Two times a day (BID) | ORAL | Status: DC
  Administered 2021-12-28 – 2021-12-29 (×2): 100 mg via ORAL
  Filled 2021-12-28 (×2): qty 1

## 2021-12-28 MED ORDER — ORAL CARE MOUTH RINSE: 15.0000 mL | Freq: Once | OROMUCOSAL | Status: AC

## 2021-12-28 MED ORDER — SODIUM CHLORIDE 0.9 % IV BOLUS
1000.0000 mL | Freq: Once | INTRAVENOUS | Status: AC
  Administered 2021-12-28: 1000 mL via INTRAVENOUS

## 2021-12-28 MED ORDER — EZETIMIBE 10 MG PO TABS
10.0000 mg | ORAL_TABLET | Freq: Every day | ORAL | Status: DC
  Administered 2021-12-29: 10 mg via ORAL
  Filled 2021-12-28: qty 1

## 2021-12-28 MED ORDER — OXYCODONE HCL 5 MG PO TABS: 5.0000 mg | ORAL_TABLET | Freq: Once | ORAL | Status: AC | PRN

## 2021-12-28 MED ORDER — BUPIVACAINE LIPOSOME 1.3 % IJ SUSP
INTRAMUSCULAR | Status: AC
  Filled 2021-12-28: qty 20

## 2021-12-28 MED ORDER — STERILE WATER FOR INJECTION IJ SOLN
INTRAMUSCULAR | Status: DC | PRN
  Administered 2021-12-28: 10 mL

## 2021-12-28 MED ORDER — LIDOCAINE HCL (PF) 2 % IJ SOLN
INTRAMUSCULAR | Status: DC | PRN
  Administered 2021-12-28: 1 mg/kg/h via INTRADERMAL

## 2021-12-28 MED ORDER — ACETAMINOPHEN 500 MG PO TABS
1000.0000 mg | ORAL_TABLET | Freq: Four times a day (QID) | ORAL | Status: AC
  Administered 2021-12-28 – 2021-12-29 (×4): 1000 mg via ORAL
  Filled 2021-12-28 (×4): qty 2

## 2021-12-28 MED ORDER — HYDROMORPHONE HCL 1 MG/ML IJ SOLN
0.5000 mg | INTRAMUSCULAR | Status: DC | PRN
  Administered 2021-12-28: 1 mg via INTRAVENOUS
  Filled 2021-12-28: qty 1

## 2021-12-28 MED ORDER — ONDANSETRON HCL 4 MG/2ML IJ SOLN: 4.0000 mg | Freq: Once | INTRAMUSCULAR | Status: AC | PRN

## 2021-12-28 MED ORDER — SODIUM CHLORIDE (PF) 0.9 % IJ SOLN
INTRAMUSCULAR | Status: DC | PRN
  Administered 2021-12-28: 20 mL

## 2021-12-28 MED ORDER — SULFAMETHOXAZOLE-TRIMETHOPRIM 800-160 MG PO TABS: 1.0000 | ORAL_TABLET | Freq: Two times a day (BID) | ORAL | 0 refills | Status: DC

## 2021-12-28 MED ORDER — HYDROMORPHONE HCL 1 MG/ML IJ SOLN
0.2500 mg | INTRAMUSCULAR | Status: DC | PRN
  Administered 2021-12-28 (×2): 0.5 mg via INTRAVENOUS

## 2021-12-28 MED ORDER — PHENYLEPHRINE 80 MCG/ML (10ML) SYRINGE FOR IV PUSH (FOR BLOOD PRESSURE SUPPORT)
PREFILLED_SYRINGE | INTRAVENOUS | Status: DC | PRN
  Administered 2021-12-28 (×2): 80 ug via INTRAVENOUS
  Administered 2021-12-28: 120 ug via INTRAVENOUS

## 2021-12-28 MED ORDER — CHLORHEXIDINE GLUCONATE 0.12 % MT SOLN
15.0000 mL | Freq: Once | OROMUCOSAL | Status: AC
  Administered 2021-12-28: 15 mL via OROMUCOSAL

## 2021-12-28 MED ORDER — LIDOCAINE HCL 2 % IJ SOLN
INTRAMUSCULAR | Status: AC
  Filled 2021-12-28: qty 40

## 2021-12-28 MED ORDER — ONDANSETRON HCL 4 MG/2ML IJ SOLN
4.0000 mg | INTRAMUSCULAR | Status: DC | PRN
  Administered 2021-12-29: 4 mg via INTRAVENOUS
  Filled 2021-12-28: qty 2

## 2021-12-28 MED ORDER — MIDAZOLAM HCL 2 MG/2ML IJ SOLN
INTRAMUSCULAR | Status: AC
  Filled 2021-12-28: qty 2

## 2021-12-28 MED ORDER — OXYCODONE HCL 5 MG PO TABS
ORAL_TABLET | ORAL | Status: AC
  Administered 2021-12-28: 5 mg via ORAL
  Filled 2021-12-28: qty 1

## 2021-12-28 MED ORDER — HYDROCODONE-ACETAMINOPHEN 5-325 MG PO TABS: 1.0000 | ORAL_TABLET | Freq: Four times a day (QID) | ORAL | 0 refills | Status: DC | PRN

## 2021-12-28 MED ORDER — OXYCODONE HCL 5 MG/5ML PO SOLN: 5.0000 mg | Freq: Once | ORAL | Status: AC | PRN

## 2021-12-28 MED ORDER — OXYCODONE HCL 5 MG PO TABS
5.0000 mg | ORAL_TABLET | ORAL | Status: DC | PRN
  Administered 2021-12-28 – 2021-12-29 (×3): 5 mg via ORAL
  Filled 2021-12-28 (×3): qty 1

## 2021-12-28 MED ORDER — ONDANSETRON HCL 4 MG/2ML IJ SOLN
INTRAMUSCULAR | Status: AC
  Filled 2021-12-28: qty 2

## 2021-12-28 MED ORDER — LACTATED RINGERS IV SOLN: INTRAVENOUS | Status: DC

## 2021-12-28 MED ORDER — PROPOFOL 10 MG/ML IV BOLUS
INTRAVENOUS | Status: AC
  Filled 2021-12-28: qty 20

## 2021-12-28 MED ORDER — SODIUM CHLORIDE 0.45 % IV SOLN: INTRAVENOUS | Status: DC

## 2021-12-28 MED ORDER — DOCUSATE SODIUM 100 MG PO CAPS: 100.0000 mg | ORAL_CAPSULE | Freq: Two times a day (BID) | ORAL | Status: DC

## 2021-12-28 MED ORDER — DEXAMETHASONE SODIUM PHOSPHATE 10 MG/ML IJ SOLN
INTRAMUSCULAR | Status: AC
  Filled 2021-12-28: qty 1

## 2021-12-28 MED ORDER — ACETAMINOPHEN 10 MG/ML IV SOLN
INTRAVENOUS | Status: AC
  Administered 2021-12-28: 1000 mg via INTRAVENOUS
  Filled 2021-12-28: qty 100

## 2021-12-28 MED ORDER — CEFAZOLIN SODIUM-DEXTROSE 2-4 GM/100ML-% IV SOLN
2.0000 g | INTRAVENOUS | Status: AC
  Administered 2021-12-28: 2 g via INTRAVENOUS
  Filled 2021-12-28: qty 100

## 2021-12-28 MED ORDER — DIPHENHYDRAMINE HCL 12.5 MG/5ML PO ELIX: 12.5000 mg | ORAL_SOLUTION | Freq: Four times a day (QID) | ORAL | Status: DC | PRN

## 2021-12-28 MED ORDER — METOPROLOL SUCCINATE ER 25 MG PO TB24
25.0000 mg | ORAL_TABLET | Freq: Every day | ORAL | Status: DC
  Administered 2021-12-29: 25 mg via ORAL
  Filled 2021-12-28: qty 1

## 2021-12-28 MED ORDER — MAGNESIUM CITRATE PO SOLN: 1.0000 | Freq: Once | ORAL | Status: DC

## 2021-12-28 MED ORDER — DIPHENHYDRAMINE HCL 50 MG/ML IJ SOLN: 12.5000 mg | Freq: Four times a day (QID) | INTRAMUSCULAR | Status: DC | PRN

## 2021-12-28 MED ORDER — PHENYLEPHRINE HCL-NACL 20-0.9 MG/250ML-% IV SOLN
INTRAVENOUS | Status: DC | PRN
  Administered 2021-12-28: 40 ug/min via INTRAVENOUS

## 2021-12-28 SURGICAL SUPPLY — 76 items
ADH SKN CLS APL DERMABOND .7 (GAUZE/BANDAGES/DRESSINGS) ×3
APL PRP STRL LF DISP 70% ISPRP (MISCELLANEOUS) ×3
APL SWBSTK 6 STRL LF DISP (MISCELLANEOUS) ×3
APPLICATOR COTTON TIP 6 STRL (MISCELLANEOUS) ×4 IMPLANT
APPLICATOR COTTON TIP 6IN STRL (MISCELLANEOUS) ×3
BAG COUNTER SPONGE SURGICOUNT (BAG) IMPLANT
BAG LAPAROSCOPIC 12 15 PORT 16 (BASKET) IMPLANT
BAG RETRIEVAL 12/15 (BASKET) ×3
BAG SPNG CNTER NS LX DISP (BAG)
CATH FOLEY 2WAY SLVR 18FR 30CC (CATHETERS) ×4 IMPLANT
CATH TIEMANN FOLEY 18FR 5CC (CATHETERS) ×4 IMPLANT
CHLORAPREP W/TINT 26 (MISCELLANEOUS) ×4 IMPLANT
CLIP LIGATING HEM O LOK PURPLE (MISCELLANEOUS) ×16 IMPLANT
CNTNR URN SCR LID CUP LEK RST (MISCELLANEOUS) ×4 IMPLANT
CONT SPEC 4OZ STRL OR WHT (MISCELLANEOUS) ×3
COVER SURGICAL LIGHT HANDLE (MISCELLANEOUS) ×4 IMPLANT
COVER TIP SHEARS 8 DVNC (MISCELLANEOUS) ×4 IMPLANT
COVER TIP SHEARS 8MM DA VINCI (MISCELLANEOUS) ×3
CUTTER ECHEON FLEX ENDO 45 340 (ENDOMECHANICALS) ×4 IMPLANT
DERMABOND ADVANCED .7 DNX12 (GAUZE/BANDAGES/DRESSINGS) ×4 IMPLANT
DRAIN CHANNEL RND F F (WOUND CARE) IMPLANT
DRAPE ARM DVNC X/XI (DISPOSABLE) ×16 IMPLANT
DRAPE COLUMN DVNC XI (DISPOSABLE) ×4 IMPLANT
DRAPE DA VINCI XI ARM (DISPOSABLE) ×12
DRAPE DA VINCI XI COLUMN (DISPOSABLE) ×3
DRAPE SURG IRRIG POUCH 19X23 (DRAPES) ×4 IMPLANT
DRSG TEGADERM 4X4.75 (GAUZE/BANDAGES/DRESSINGS) ×4 IMPLANT
ELECT PENCIL ROCKER SW 15FT (MISCELLANEOUS) IMPLANT
ELECT REM PT RETURN 15FT ADLT (MISCELLANEOUS) ×4 IMPLANT
GAUZE 4X4 16PLY ~~LOC~~+RFID DBL (SPONGE) IMPLANT
GAUZE SPONGE 2X2 8PLY STRL LF (GAUZE/BANDAGES/DRESSINGS) IMPLANT
GAUZE SPONGE 4X4 12PLY STRL (GAUZE/BANDAGES/DRESSINGS) ×4 IMPLANT
GLOVE BIO SURGEON STRL SZ 6.5 (GLOVE) ×4 IMPLANT
GLOVE BIOGEL PI IND STRL 7.5 (GLOVE) ×4 IMPLANT
GLOVE SURG LX STRL 7.5 STRW (GLOVE) ×8 IMPLANT
GOWN SRG XL LVL 4 BRTHBL STRL (GOWNS) ×4 IMPLANT
GOWN STRL NON-REIN XL LVL4 (GOWNS) ×3
GOWN STRL REUS W/ TWL XL LVL3 (GOWN DISPOSABLE) ×8 IMPLANT
GOWN STRL REUS W/TWL XL LVL3 (GOWN DISPOSABLE) ×6
HOLDER FOLEY CATH W/STRAP (MISCELLANEOUS) ×4 IMPLANT
IRRIG SUCT STRYKERFLOW 2 WTIP (MISCELLANEOUS) ×3
IRRIGATION SUCT STRKRFLW 2 WTP (MISCELLANEOUS) ×4 IMPLANT
IV LACTATED RINGERS 1000ML (IV SOLUTION) ×4 IMPLANT
KIT PROCEDURE DA VINCI SI (MISCELLANEOUS) ×3
KIT PROCEDURE DVNC SI (MISCELLANEOUS) ×4 IMPLANT
KIT TURNOVER KIT A (KITS) IMPLANT
LOOP VESSEL MAXI BLUE (MISCELLANEOUS) IMPLANT
NDL INSUFFLATION 14GA 120MM (NEEDLE) ×4 IMPLANT
NDL SPNL 22GX7 QUINCKE BK (NEEDLE) ×4 IMPLANT
NEEDLE INSUFFLATION 14GA 120MM (NEEDLE) ×3 IMPLANT
NEEDLE SPNL 22GX7 QUINCKE BK (NEEDLE) ×3 IMPLANT
PACK ROBOT UROLOGY CUSTOM (CUSTOM PROCEDURE TRAY) ×4 IMPLANT
PAD POSITIONING PINK XL (MISCELLANEOUS) ×4 IMPLANT
PORT ACCESS TROCAR AIRSEAL 12 (TROCAR) ×4 IMPLANT
RELOAD STAPLE 45 4.1 GRN THCK (STAPLE) ×4 IMPLANT
SEAL CANN UNIV 5-8 DVNC XI (MISCELLANEOUS) ×16 IMPLANT
SEAL XI 5MM-8MM UNIVERSAL (MISCELLANEOUS) ×12
SET TRI-LUMEN FLTR TB AIRSEAL (TUBING) ×4 IMPLANT
SOLUTION ELECTROLUBE (MISCELLANEOUS) ×4 IMPLANT
SPIKE FLUID TRANSFER (MISCELLANEOUS) ×4 IMPLANT
SPONGE T-LAP 4X18 ~~LOC~~+RFID (SPONGE) ×4 IMPLANT
STAPLE RELOAD 45 GRN (STAPLE) ×3 IMPLANT
STAPLE RELOAD 45MM GREEN (STAPLE) ×3
SUT ETHIBOND CT1 BRD #0 30IN (SUTURE) IMPLANT
SUT ETHILON 3 0 PS 1 (SUTURE) ×4 IMPLANT
SUT MNCRL AB 4-0 PS2 18 (SUTURE) ×8 IMPLANT
SUT PDS AB 1 CT1 27 (SUTURE) ×8 IMPLANT
SUT VIC AB 2-0 SH 27 (SUTURE) ×3
SUT VIC AB 2-0 SH 27X BRD (SUTURE) ×4 IMPLANT
SUT VICRYL 0 UR6 27IN ABS (SUTURE) ×4 IMPLANT
SUT VLOC BARB 180 ABS3/0GR12 (SUTURE) ×9
SUTURE VLOC BRB 180 ABS3/0GR12 (SUTURE) ×12 IMPLANT
SYR 27GX1/2 1ML LL SAFETY (SYRINGE) ×4 IMPLANT
TOWEL OR NON WOVEN STRL DISP B (DISPOSABLE) ×4 IMPLANT
TROCAR Z-THREAD FIOS 5X100MM (TROCAR) IMPLANT
WATER STERILE IRR 1000ML POUR (IV SOLUTION) ×4 IMPLANT

## 2021-12-28 NOTE — Op Note (Signed)
NAME: DESMIN, [name] MEDICAL RECORD NO: [id number] ACCOUNT NO: [id number] DATE OF BIRTH: 08-05-1950 FACILITY: [facility] LOCATION: [facility] PHYSICIAN: Alexis Frock, MD  Operative Report   DATE OF PROCEDURE: 12/28/2021  PREOPERATIVE DIAGNOSIS:  Large right seminal vesicle mass, abnormal prostate MRI.  PROCEDURE:  Radical prostatectomy, bilateral pelvic lymph node dissection.  ESTIMATED BLOOD LOSS:  532 mL  COMPLICATIONS:  None.  SPECIMEN: 1.  Right external lymph nodes.  The right obturator lymph nodes. 2.  Left external and obturator lymph nodes. 3.  Periprostatic fat. 4.  Prostate with right seminal vesicle mass en bloc.  FINDINGS: 1.  No evidence of sentinel lymph nodes with sentinel lymphangiography in the pelvis. 2.  Heterogeneous right seminal vesicle tip mass with significant neovascularity.  INDICATIONS:  A very pleasant 71 year old man who was found incidentally to have a sizeable pelvic neoplasm on imaging.  Further dedicated imaging with MRI revealed a seminal vesicle mass that was heterogeneous and enhancing. His PSA is normal.  He does  have a high-grade lesion in his prostate on the left side.  Neoplasm was clinically localized.  Options were discussed for management including surveillance protocols versus attempted biopsy, percutaneous transrectal versus proceeding with definitive  management with dissection of the mass with radical prostatectomy being most definitive given his continuous nature with the prostate and this was high-grade MRI lesion and he wished to proceed.  Informed consent was obtained and placed in medical record.  PROCEDURE IN DETAIL:  The patient being [name] identified, procedure being radical prostatectomy with node dissection was confirmed.  Procedure timeout was performed.  Intravenous antibiotics were administered.  General endotracheal anesthesia  induced.  The patient was placed into a low lithotomy position.  Sterile field was created,  prepped and draped the patient's penis, perineum, and proximal thighs using iodine and his infra-xiphoid abdomen using chlorhexidine gluconate after clipper  shaving for the catheter was placed free to straight drain.  He was placed into a steep Trendelenburg positioning, found to be suitable position.  His arms were tucked to the side with gel rolls.  A high flow, low pressure, pneumoperitoneum was obtained  using Veress technique in the supraumbilical midline having passed the aspiration and drop test.  Next, an 8 mm robotic arm port was placed in same location.  Laparoscopic examination of peritoneal cavity revealed no significant adhesions, no visceral  injury.  Additional ports were placed as follows:  Right paramedian 8 mm robotic port, right far lateral 12 mm AirSeal assist port, right paramedian 5 mm suction port, left paramedian 8 mm robotic port, left far lateral 8 mm robotic port.  Robot was  docked and passed the electronic checks.  Initial attention was directed at posterior dissection.  Given the orientation neoplasm, felt that a posterior approach addressing the mass, seminal vesicles before bladder mobilization would be most prudent for  visualization and completeness.  A posterior peritoneal flap was created in an inverted U-type fashion directly behind portion of the proximal and mid portion of the bladder was carefully dissected distally.  Bilateral vas and SV's were encountered as  anticipated.  Peritoneal flap was dissected off the posterior aspect of this and all the way to the area of the prostatic apex.  The left vas deferens was encountered, ligated and left seminal vesicle was circumferentially mobilized such that it was  adhered by its prostatic attachments.  The right seminal vesicle was very carefully mobilized first posteriorly and towards the tip of the large neoplasm was encountered.  There was significant neovascularity around this and this was very, very carefully   circumferentially mobilized, he did have mostly vascular supply from the area of the internal iliacs as anticipated with likely prostate. In this view origin the anterior aspect of this was in direct apposition to the bladder dissipated and very near  the anticipated course of the right ureter. As is the right ureter was formally identified distally, iliac vessels marked a vessel loop and then traced circumferentially to the ureterovesical junction, so we could easily visualize this and the anterior  mass plane was further developed as to the lateral plane and this was done in a fashion to prevent right ureteral injury, which did not occur.  This was circumferentially completed, so the right vas deferens was only connected by its prostatic  attachments and the right vas deferens was similarly ligated.  This completed the posterior dissection.  Vessel loop was removed from the right ureter.  Next, incision was made lateral to the right median umbilical ligament from the midline towards the  internal ring coursing along the iliac vessels towards the area of the right vas.  Again, the level of the ring was transected once again using as a medial bucket handle and the right bladder separated, also from the pelvic sidewall towards the  endopelvic fascia on the right side.  A mirror image dissection was performed on the left side.  Anterior attachments were straight down with cautery scissors to expose the anterior base of the prostate, which was defatted to better denote the prostate  and the bladder neck junction.  During development of the space of Retzius was immediately noted the patient had bilateral indirect inguinal hernias approximately 2.5 cm fascial defect on each side.  I was concerned about possible bowel strangulation, these were  addressed later.  Attention then directed at injection of ICG dye, 0.2 mL of ICG dye was injected in each of the prostate using percutaneously placed robotically guided  spinal needle with intervening suctioning to prevent dye spillages do not occur.  The  endopelvic fascia was then swept away from the lateral aspect of the prostate.  The patient was in orientation to expose the dorsal venous complex, was controlled using green load stapler, taking exquisite care to avoid membranous urethral injury, which  did not occur.  Then, approximately 10 minutes post-dye injection, the pelvis was inspected under near infrared fluorescent light.  Sentinel lymphangiography revealed excellent parenchymal uptake of the prostate with some dye coursing actually towards the area of the right, some of this on neoplasm.  However, there were no obvious sentinel lymph nodes within the pelvis.  As such, standard template  lymphadenectomy was performed first on the right side, the right external iliac group with boundaries being right external iliac artery, vein, pelvic sidewall.  Lymphostasis achieved with cold clips set aside labeled as right external lymph nodes.  Next,  right obturator group was dissected free of the boundaries being right external iliac vein, pelvic sidewall, obturator nerve.  Lymphostasis achieved with cold clips set aside labeled as right obturator lymph nodes.  Next, left external group was  dissected free and left obturator group together set aside as such, bilateral obturator nerves were inspected following this and  found to be uninjured.  Attention was directed at bladder dissection.  A lateral release was performed on each side to better  denote the true bladder neck prostate junction and the bladder neck was separated from the prostate in anterior and posterior direction,  keeping what appeared to be a rim of circular muscle fibers with each plane of dissection thus performing complete  bladder neck preservation, having transecting the posterior aspect of this and we reconnected with the previous posterior plane and this was quite advantageous as there would be  plenty of room for manipulation of the en bloc specimen.  The left prostate  pedicle was controlled using a sequential clipping technique in a base to  apex orientation.  Partial nerve sparing performed on the left side. On the right side majority of the prostate pedicle had already been addressed via the posterior approach and dissection  of the seminal vesicle mass.  The membranous urethra was coldly transected via the anterior plane.  This completely freed up the prostate plus right SV mass, specimen was placed into a large EndoCatch bag for later retrieval.  Digital rectal exam was  then performed using indicator glove.  No evidence of rectal violation was noted.  Attention directed at posterior reconstruction, posterior urethral plate was reapproximated to the posterior bladder neck using running V-Loc suture, brought these  structures into tension-free apposition.  Next mucosa-to-mucosa anastomosis was performed using double-armed 3-0 V-Loc suture.  No Foley catheter was placed, irrigated quantitatively.  Sponge and needle counts were correct.  Hemostasis was excellent.  We  achieved the goals on extubated portion of the procedure today.  Once again inspected the area of the inguinal ring.  Again, there was approximately 2.5 cm defect on each side, quite concerned that this was a size that could promote future bowel  strangulation versus small bowel.  Therefore, first on the right side defect was addressed by placing figure-of-eight Ethibond from the pectinate line of the pubic ramus to the true fascia of the internal ring.  This completely resolved the hernia defect  on the right side.  A mirror image hernia repair was performed on the left side.  A closed suction drain was brought through the previous left lateral most robotic port site into the peritoneal cavity.  Robot was undocked.  The previous right lateral  most assistant port site was closed with fascia using Carter-Thomason suture passer and  0 Vicryl.  Specimen was retrieved by extending the previous camera port site superiorly with distance of approximately 4 cm, removing the prostate plus right SV  neoplasm en bloc, setting aside for permanent pathology.  Extraction site was closed with fascia using figure-of-eight PDS x4 followed by reapproximation of Scarpa's with running Vicryl.  All incision sites were infiltrated with dilute lipolyzed Marcaine  and closed at the level of the skin using subcuticular Monocryl followed by Dermabond.  Procedure was terminated.  The patient tolerated procedure well, no immediate perioperative complications.  The patient taken to postanesthesia care unit in stable  condition.  Plan for observation admission.  Please note, first assistant, Debbrah Alar, was crucial for all portions of the procedure today.  She provided invaluable retraction, suctioning, vascular clipping, vascular stapling, robotic instrument exchange and general first assistance.   SHY D: 12/28/2021 11:47:15 am T: 12/28/2021 1:44:00 pm  JOB: [id number]/ [id number]

## 2021-12-28 NOTE — Anesthesia Procedure Notes (Signed)
Procedure Name: Intubation Date/Time: 12/28/2021 7:41 AM  Performed by: Gerald Leitz, CRNAPre-anesthesia Checklist: Patient identified, Patient being monitored, Timeout performed, Emergency Drugs available and Suction available Patient Re-evaluated:Patient Re-evaluated prior to induction Oxygen Delivery Method: Circle system utilized Preoxygenation: Pre-oxygenation with 100% oxygen Induction Type: IV induction Ventilation: Mask ventilation without difficulty Laryngoscope Size: Mac and 3 Grade View: Grade I Tube type: Oral Tube size: 8.0 mm Number of attempts: 1 Airway Equipment and Method: Stylet Placement Confirmation: ETT inserted through vocal cords under direct vision, positive ETCO2 and breath sounds checked- equal and bilateral Secured at: 22 cm Tube secured with: Tape Dental Injury: Teeth and Oropharynx as per pre-operative assessment

## 2021-12-28 NOTE — Anesthesia Postprocedure Evaluation (Signed)
Anesthesia Post Note  Patient: [name]  Procedure(s) Performed: XI ROBOTIC ASSISTED LAPAROSCOPIC RADICAL PROSTATECTOMY AND INDOCYANINE GREEN DYE INJECTION (Abdomen) PELVIC LYMPH NODE DISSECTION (Bilateral: Abdomen) XI ROBOTIC ASSISTED BILATERAL INGUINAL HERNIA (Abdomen)     Patient location during evaluation: PACU Anesthesia Type: General Level of consciousness: awake and alert Pain management: pain level controlled Vital Signs Assessment: post-procedure vital signs reviewed and stable Respiratory status: spontaneous breathing, nonlabored ventilation, respiratory function stable and patient connected to nasal cannula oxygen Cardiovascular status: blood pressure returned to baseline and stable Postop Assessment: no apparent nausea or vomiting Anesthetic complications: no  No notable events documented.  Last Vitals:  Vitals:   12/28/21 1415 12/28/21 1442  BP: 107/65 135/74  Pulse: 74 84  Resp: 17 (!) 22  Temp:  (!) 36.3 C  SpO2: 94% 97%    Last Pain:  Vitals:   12/28/21 1442  TempSrc: Oral  PainSc:                  Barnet Glasgow

## 2021-12-28 NOTE — H&P (Signed)
[name] is an 71 y.o. male.    Chief Complaint: Pre-Op Prostatectomy / Excision of Seminal Vesical Neoplasm  HPI:   1 - Right Seminal Vesicla Mass - 4.5cm solid, enhancing, localized Rt SV mass incidintal on CT and then dediated MRI 10/2021. Also P4 prostate lesiosn. Prostate <35gm, no median. NO adenopathy.   PMH sig for CAD/Stent (not limitig, follows [facility], [facility]), total knee. 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 Wife with recurrnt CLL treatd and [facility]. His PCP is W. [name] MD.   Today "[name]" is seen to proceed with prostatecotmy / node dissection / removal of SV mass. No interval fevers. Hgb 14.4. Has held plavix as instructed in cards clearance.   Past Medical History:  Diagnosis Date   Adenomatous colon polyp 2004   Allergy    Arthritis    Diverticulosis 2011   Former smoker    GERD (gastroesophageal reflux disease)    HLD (hyperlipidemia)    Hyperplastic colon polyp 2004   Hypertension    Melanoma in situ [facility])     Past Surgical History:  Procedure Laterality Date   COLONOSCOPY  01/21/2009   SEVERAL    CORONARY STENT INTERVENTION N/A 10/04/2020   Procedure: CORONARY STENT INTERVENTION;  Surgeon: Leonie Man, MD;  Location: [facility];  Service: Cardiovascular;  Laterality: N/A;   LEFT HEART CATH AND CORONARY ANGIOGRAPHY N/A 10/04/2020   Procedure: LEFT HEART CATH AND CORONARY ANGIOGRAPHY;  Surgeon: Leonie Man, MD;  Location: [facility];  Service: Cardiovascular;  Laterality: N/A;   LEFT HEART CATH AND CORONARY ANGIOGRAPHY Left 11/30/2020   Procedure: LEFT HEART CATH AND CORONARY ANGIOGRAPHY;  Surgeon: Leonie Man, MD;  Location: [facility];  Service: Cardiovascular;  Laterality: Left;   TOOTH EXTRACTION     TOTAL KNEE ARTHROPLASTY Right 04/26/2020    Family History  Problem Relation Age of Onset   Heart disease Mother        died in her 46's   Lung cancer Father    Diabetes Daughter    Social History:   reports that he quit smoking about 23 years ago. His smoking use included cigarettes and cigars. He has a 30.00 pack-year smoking history. His smokeless tobacco use includes chew. He reports current alcohol use of about 3.0 standard drinks of alcohol per week. He reports that he does not use drugs.  Allergies:  Allergies  Allergen Reactions   Levaquin [Levofloxacin In D5w] Shortness Of Breath    Medications Prior to Admission  Medication Sig Dispense Refill   aspirin EC 81 MG tablet Take 81 mg by mouth daily. Swallow whole.     cetirizine (ZYRTEC) 10 MG tablet TAKE 1 TABLET BY MOUTH EVERY DAY 90 tablet 3   clopidogrel (PLAVIX) 75 MG tablet Take 1 tablet (75 mg total) by mouth daily. 90 tablet 0   ezetimibe (ZETIA) 10 MG tablet TAKE 1 TABLET BY MOUTH EVERY DAY 90 tablet 3   isosorbide mononitrate (IMDUR) 30 MG 24 hr tablet Take 0.5 tablets (15 mg total) by mouth daily. 45 tablet 2   losartan (COZAAR) 50 MG tablet Take 0.5 tablets (25 mg total) by mouth in the morning. 45 tablet 1   metoprolol succinate (TOPROL-XL) 25 MG 24 hr tablet TAKE 1 TABLET (25 MG TOTAL) BY MOUTH DAILY. 90 tablet 3   rosuvastatin (CRESTOR) 40 MG tablet Take 1 tablet (40 mg total) by mouth daily. 90 tablet 0   HYDROcodone-acetaminophen (  NORCO) 5-325 MG tablet Take 1 tablet by mouth every 6 (six) hours as needed for moderate pain. (Patient not taking: Reported on 12/10/2021) 30 tablet 0   nitroGLYCERIN (NITROSTAT) 0.4 MG SL tablet PLACE 1 TABLET UNDER THE TONGUE EVERY 5 MINUTES AS NEEDED FOR CHEST PAIN. 50 tablet 3   tiZANidine (ZANAFLEX) 4 MG tablet Take 1 tablet (4 mg total) by mouth every 6 (six) hours as needed for muscle spasms. (Patient not taking: Reported on 12/10/2021) 30 tablet 0    No results found for this or any previous visit (from the past 48 hour(s)). No results found.  Review of Systems  Constitutional:  Negative for chills and fever.  All other systems reviewed and are negative.   Blood pressure  117/63, pulse 72, temperature 97.6 F (36.4 C), temperature source Oral, resp. rate 15, height 6' (1.829 m), weight 96.6 kg, SpO2 91 %. Physical Exam HENT:     Head: Normocephalic.     Nose: Nose normal.  Eyes:     Pupils: Pupils are equal, round, and reactive to light.  Cardiovascular:     Rate and Rhythm: Normal rate.  Pulmonary:     Effort: Pulmonary effort is normal.  Abdominal:     General: Abdomen is flat.  Genitourinary:    Comments: No CVAT at present Musculoskeletal:        General: Normal range of motion.     Cervical back: Normal range of motion.  Neurological:     General: No focal deficit present.     Mental Status: He is alert.  Psychiatric:        Mood and Affect: Mood normal.      Assessment/Plan  Proceed as planned with radical prostatectomy / node dissection for rare SV neoplasm. Risks, benefits, alternatives, expected peri-op course discussed previously and reiterate today.   Alexis Frock, MD 12/28/2021, 6:03 AM

## 2021-12-28 NOTE — Discharge Instructions (Addendum)
1- Drain Sites - You may have some mild persistent drainage from old drain site for several days, this is normal. This can be covered with cotton gauze for convenience.  2 - Stiches - Your stitches are all dissolvable. You may notice a "loose thread" at your incisions, these are normal and require no intervention. You may cut them flush to the skin with fingernail clippers if needed for comfort.  3 - Diet - No restrictions  4 - Activity - No heavy lifting / straining (any activities that require valsalva or "bearing down") x 4 weeks. Otherwise, no restrictions.  5 - Bathing - You may shower immediately. Do not take a bath or get into swimming pool where incision sites are submersed in water x 4 weeks.   6 - Catheter - Will remain in place until removed at your next appointment. It may be cleaned with soap and water in the shower. It may be disconnected from the drain bag while in the shower to avoid tripping over the tube. You may apply Neosporin or Vaseline ointment as needed to the tip of the penis where the catheter inserts to reduce friction and irritation in this spot.   7 - When to Call the Doctor - Call MD for any fever >102, any acute wound problems, or any severe nausea / vomiting. You can call the [facility] [phone]) 24 hours a day 365 days a year. It will roll-over to the answering service and on-call physician after hours.  You may resume Plavix as instructed by Dr. Tresa Moore.

## 2021-12-28 NOTE — Brief Op Note (Signed)
12/28/2021  11:33 AM  PATIENT:  [name]  71 y.o. male  PRE-OPERATIVE DIAGNOSIS:  SEMINAL VESICAL MASS, PROSTATE CANCER  POST-OPERATIVE DIAGNOSIS:  SEMINAL VESICAL MASS, PROSTATE CANCER  PROCEDURE:  Procedure(s) with comments: XI ROBOTIC ASSISTED LAPAROSCOPIC RADICAL PROSTATECTOMY AND INDOCYANINE GREEN DYE INJECTION (N/A) - 3 HRS PELVIC LYMPH NODE DISSECTION (Bilateral) XI ROBOTIC ASSISTED BILATERAL INGUINAL HERNIA  SURGEON:  Surgeon(s) and Role:    * Alexis Frock, MD - Primary  PHYSICIAN ASSISTANT:   ASSISTANTS: Debbrah Alar PA   ANESTHESIA:   local and general  EBL:  100 mL   BLOOD ADMINISTERED:none  DRAINS: Urinary Catheter (Foley) and JP to bulb    LOCAL MEDICATIONS USED:  MARCAINE     SPECIMEN:  Source of Specimen:  1- pelvic lymph nodes; 2 - prostate with Rt SV mass en bloc  DISPOSITION OF SPECIMEN:  PATHOLOGY  COUNTS:  YES  TOURNIQUET:  * No tourniquets in log *  DICTATION: .Other Dictation: Dictation Number [id number]  PLAN OF CARE: Admit for overnight observation  PATIENT DISPOSITION:  PACU - hemodynamically stable.   Delay start of Pharmacological VTE agent (>24hrs) due to surgical blood loss or risk of bleeding: yes

## 2021-12-28 NOTE — Transfer of Care (Signed)
Immediate Anesthesia Transfer of Care Note  Patient: [name]  Procedure(s) Performed: Procedure(s) with comments: XI ROBOTIC ASSISTED LAPAROSCOPIC RADICAL PROSTATECTOMY AND INDOCYANINE GREEN DYE INJECTION (N/A) - 3 HRS PELVIC LYMPH NODE DISSECTION (Bilateral) XI ROBOTIC ASSISTED BILATERAL INGUINAL HERNIA  Patient Location: PACU  Anesthesia Type:General  Level of Consciousness: Alert, Awake, Oriented  Airway & Oxygen Therapy: Patient Spontanous Breathing  Post-op Assessment: Report given to RN  Post vital signs: Reviewed and stable  Last Vitals:  Vitals:   12/28/21 0543  BP: 117/63  Pulse: 72  Resp: 15  Temp: 36.4 C  SpO2: 09%    Complications: No apparent anesthesia complications

## 2021-12-29 ENCOUNTER — Encounter (HOSPITAL_COMMUNITY): Payer: Self-pay | Admitting: Urology

## 2021-12-29 DIAGNOSIS — C61 Malignant neoplasm of prostate: Secondary | ICD-10-CM | POA: Diagnosis not present

## 2021-12-29 LAB — HEMOGLOBIN AND HEMATOCRIT, BLOOD
HCT: 36.6 % — ABNORMAL LOW (ref 39.0–52.0)
Hemoglobin: 11.8 g/dL — ABNORMAL LOW (ref 13.0–17.0)

## 2021-12-29 LAB — BASIC METABOLIC PANEL WITH GFR
Anion gap: 6 (ref 5–15)
BUN: 19 mg/dL (ref 8–23)
CO2: 22 mmol/L (ref 22–32)
Calcium: 8.5 mg/dL — ABNORMAL LOW (ref 8.9–10.3)
Chloride: 107 mmol/L (ref 98–111)
Creatinine, Ser: 1.33 mg/dL — ABNORMAL HIGH (ref 0.61–1.24)
GFR, Estimated: 56 mL/min — ABNORMAL LOW (ref >60)
Glucose, Bld: 147 mg/dL — ABNORMAL HIGH (ref 70–99)
Potassium: 4.6 mmol/L (ref 3.5–5.1)
Sodium: 135 mmol/L (ref 135–145)

## 2021-12-29 MED ORDER — BISACODYL 10 MG RE SUPP
10.0000 mg | Freq: Once | RECTAL | Status: AC
  Administered 2021-12-29: 10 mg via RECTAL
  Filled 2021-12-29: qty 1

## 2021-12-29 MED ORDER — HYDROCODONE-ACETAMINOPHEN 5-325 MG PO TABS: 1.0000 | ORAL_TABLET | Freq: Four times a day (QID) | ORAL | 0 refills | Status: DC | PRN

## 2021-12-29 NOTE — Progress Notes (Signed)
1 Day Post-Op Subjective: The patient is doing well.  No nausea or vomiting. Pain is adequately controlled.  Objective: Vital signs in last 24 hours: Temp:  [96.8 F (36 C)-98.4 F (36.9 C)] 98.1 F (36.7 C) (12/09 0526) Pulse Rate:  [73-87] 74 (12/09 0526) Resp:  [13-22] 21 (12/09 0526) BP: (102-135)/(58-74) 128/62 (12/09 0526) SpO2:  [88 %-97 %] 91 % (12/09 0526)  Intake/Output from previous day: 12/08 0701 - 12/09 0700 In: 4924 [P.O.:410; I.V.:3314; IV Piggyback:1200] Out: 1860 [Urine:1500; Drains:260; Blood:100] Intake/Output this shift: No intake/output data recorded.  Physical Exam:  General: Alert and oriented. CV: RRR Lungs: Clear bilaterally. GI: Soft, Nondistended. Incisions: Clean, dry, and intact Urine: Clear Extremities: Nontender, no erythema, no edema.  Lab Results: Recent Labs    12/28/21 1300 12/29/21 0414  HGB 12.6* 11.8*  HCT 39.6 36.6*      Assessment/Plan: POD# 1 s/p robotic prostatectomy.  1) SL IVF 2) Ambulate, Incentive spirometry 3) Transition to oral pain medication 4) Dulcolax suppository 5) D/C pelvic drain 6) Plan for likely discharge later today   Pryor Curia. MD   LOS: 0 days   Dutch Gray 12/29/2021, 8:31 AM

## 2021-12-29 NOTE — TOC Initial Note (Signed)
Transition of Care [facility]) - Initial/Assessment Note    Patient Details  Name: [name] MRN: [id number] Date of Birth: 1950-07-16  Transition of Care [facility]) CM/SW Contact:    Leeroy Cha, RN Phone Number: 12/29/2021, 12:46 PM  Clinical Narrative:                   Transition of Care [facility]) Screening Note   Patient Details  Name: [name] Date of Birth: [birth year]   Transition of Care [facility]) CM/SW Contact:    Leeroy Cha, RN Phone Number: 12/29/2021, 12:46 PM    Transition of Care Department [facility]) has reviewed patient and no TOC needs have been identified at this time. We will continue to monitor patient advancement through interdisciplinary progression rounds. If new patient transition needs arise, please place a TOC consult.   Expected Discharge Plan: Home/Self Care Barriers to Discharge: Barriers Resolved   Patient Goals and CMS Choice Patient states their goals for this hospitalization and ongoing recovery are:: to go home CMS Medicare.gov Compare Post Acute Care list provided to:: Patient    Expected Discharge Plan and Services Expected Discharge Plan: Home/Self Care   Discharge Planning Services: CM Consult   Living arrangements for the past 2 months: Single Family Home Expected Discharge Date: 12/29/21                                    Prior Living Arrangements/Services Living arrangements for the past 2 months: Single Family Home Lives with:: Spouse Patient language and need for interpreter reviewed:: Yes Do you feel safe going back to the place where you live?: Yes            Criminal Activity/Legal Involvement Pertinent to Current Situation/Hospitalization: No - Comment as needed  Activities of Daily Living Home Assistive Devices/Equipment: None ADL Screening (condition at time of admission) Patient's cognitive ability adequate to safely complete daily activities?: Yes Is the patient deaf or have difficulty hearing?:  No Does the patient have difficulty seeing, even when wearing glasses/contacts?: No Does the patient have difficulty concentrating, remembering, or making decisions?: No Patient able to express need for assistance with ADLs?: Yes Does the patient have difficulty dressing or bathing?: No Independently performs ADLs?: Yes (appropriate for developmental age) Does the patient have difficulty walking or climbing stairs?: No Weakness of Legs: None Weakness of Arms/Hands: None  Permission Sought/Granted                  Emotional Assessment Appearance:: Appears stated age Attitude/Demeanor/Rapport: Engaged Affect (typically observed): Calm Orientation: : Oriented to Self, Oriented to Place, Oriented to  Time, Oriented to Situation Alcohol / Substance Use: Not Applicable Psych Involvement: No (comment)  Admission diagnosis:  Prostate cancer ([place]) [C61] Patient Active Problem List   Diagnosis Date Noted   Prostate cancer ([place]) 12/28/2021   COPD (chronic obstructive pulmonary disease) ([place]) 01/30/2021   Daytime sleepiness 01/30/2021   Angina, class III ([place]) 10/04/2020   Abnormal cardiac CT angiography 10/04/2020   Coronary artery disease involving native coronary artery of native heart with angina pectoris ([place])    Left hamstring injury 09/13/2019   Low back pain radiating to right leg 06/15/2019   Posterior tibial tendinitis, left 12/04/2018   Bilateral leg pain 05/21/2017   Degenerative arthritis of knee, bilateral 02/18/2017   Bilateral knee pain 07/18/2016   Right foot pain 04/16/2016   LLQ abdominal  pain 06/10/2013   Melanoma in situ [facility])    Shortness of breath 05/25/2012   Allergic rhinitis 05/25/2012   Diverticulosis    Former smoker    HLD (hyperlipidemia)    Allergy    ABDOMINAL PAIN, LEFT LOWER QUADRANT 10/23/2009   GERD 03/31/2007   DIVERTICULITIS, ACUTE 03/31/2007   SINUSITIS- ACUTE-NOS 02/18/2007   URI 02/18/2007   ERECTILE DYSFUNCTION 10/29/2006   TOBACCO  ABUSE 10/29/2006   Hyperlipidemia with target LDL less than 70 10/01/2006   Essential hypertension 10/01/2006   DIVERTICULOSIS, COLON 10/01/2006   PCP:  Susy Frizzle, MD Pharmacy[facility]   [id number] [place] [place] [id number]: [phone]: [phone] [id number] [place] [id number]: [phone]: [phone]: [phone]: [phone]: [phone]: [phone]   Social Determinants of Health (SDOH) Interventions    Readmission Risk Interventions   No data to display

## 2021-12-29 NOTE — Progress Notes (Signed)
Mobility Specialist - Progress Note   12/29/21 1256  Mobility  Activity Ambulated with assistance in hallway  Level of Assistance Standby assist, set-up cues, supervision of patient - no hands on  Assistive Device Front wheel walker  Distance Ambulated (ft) 160 ft  Range of Motion/Exercises Active  Activity Response Tolerated well  Mobility Referral Yes  $Mobility charge 1 Mobility   Pt was found on recliner chair and agreeable to ambulate. C/o L sided abdominal pain during ambulation stating it felt like a 6/10. During session had some spot bleeding and RN was notified. At EOS was left in bed with necessities in reach and RN in room.  Ferd Hibbs Mobility Specialist

## 2021-12-29 NOTE — TOC Transition Note (Signed)
Transition of Care [facility]) - CM/SW Discharge Note   Patient Details  Name: NICHOLLAS PERUSSE MRN: [id number] Date of Birth: 11/02/1950  Transition of Care [facility]) CM/SW Contact:  Leeroy Cha, RN Phone Number: 12/29/2021, 12:46 PM   Clinical Narrative:    Discharged to home with self care.   Final next level of care: Home/Self Care Barriers to Discharge: Barriers Resolved   Patient Goals and CMS Choice Patient states their goals for this hospitalization and ongoing recovery are:: to go home CMS Medicare.gov Compare Post Acute Care list provided to:: Patient    Discharge Placement                       Discharge Plan and Services   Discharge Planning Services: CM Consult                                 Social Determinants of Health (SDOH) Interventions     Readmission Risk Interventions   No data to display

## 2021-12-29 NOTE — Progress Notes (Signed)
Patient discharged home.  Discharge instructions explained to patient and family.  Foley teaching done with patient and daughter Colletta [place]), Daughter demonstrated understanding.

## 2021-12-29 NOTE — Discharge Summary (Signed)
  Date of admission: 12/28/2021  Date of discharge: 12/29/2021  Admission diagnosis: Prostate Cancer  Discharge diagnosis: Prostate Cancer  History and Physical: For full details, please see admission history and physical. Briefly, [name] is a 71 y.o. gentleman with localized prostate cancer.  After discussing management/treatment options, he elected to proceed with surgical treatment.  Hospital Course: [name] was taken to the operating room on 12/28/2021 and underwent a robotic assisted laparoscopic radical prostatectomy. He tolerated this procedure well and without complications. Postoperatively, he was able to be transferred to a regular hospital room following recovery from anesthesia.  He was able to begin ambulating the night of surgery. He remained hemodynamically stable overnight.  He had excellent urine output with appropriately minimal output from his pelvic drain and his pelvic drain was removed on POD #1.  He was transitioned to oral pain medication, tolerated a clear liquid diet, and had met all discharge criteria and was able to be discharged home later on POD#1.  Laboratory values:  Recent Labs    12/28/21 1300 12/29/21 0414  HGB 12.6* 11.8*  HCT 39.6 36.6*    Disposition: Home  Discharge instruction: He was instructed to be ambulatory but to refrain from heavy lifting, strenuous activity, or driving. He was instructed on urethral catheter care.  Discharge medications:   Allergies as of 12/29/2021       Reactions   Levaquin [levofloxacin In D5w] Shortness Of Breath        Medication List     STOP taking these medications    clopidogrel 75 MG tablet Commonly known as: PLAVIX   tiZANidine 4 MG tablet Commonly known as: Zanaflex       TAKE these medications    aspirin EC 81 MG tablet Take 81 mg by mouth daily. Swallow whole.   cetirizine 10 MG tablet Commonly known as: ZYRTEC TAKE 1 TABLET BY MOUTH EVERY DAY   docusate sodium 100 MG  capsule Commonly known as: COLACE Take 1 capsule (100 mg total) by mouth 2 (two) times daily.   ezetimibe 10 MG tablet Commonly known as: ZETIA TAKE 1 TABLET BY MOUTH EVERY DAY   HYDROcodone-acetaminophen 5-325 MG tablet Commonly known as: Norco Take 1-2 tablets by mouth every 6 (six) hours as needed for moderate pain or severe pain. What changed:  how much to take reasons to take this   isosorbide mononitrate 30 MG 24 hr tablet Commonly known as: IMDUR Take 0.5 tablets (15 mg total) by mouth daily.   losartan 50 MG tablet Commonly known as: COZAAR Take 0.5 tablets (25 mg total) by mouth in the morning.   metoprolol succinate 25 MG 24 hr tablet Commonly known as: TOPROL-XL TAKE 1 TABLET (25 MG TOTAL) BY MOUTH DAILY.   nitroGLYCERIN 0.4 MG SL tablet Commonly known as: NITROSTAT PLACE 1 TABLET UNDER THE TONGUE EVERY 5 MINUTES AS NEEDED FOR CHEST PAIN.   rosuvastatin 40 MG tablet Commonly known as: CRESTOR Take 1 tablet (40 mg total) by mouth daily.   sulfamethoxazole-trimethoprim 800-160 MG tablet Commonly known as: BACTRIM DS Take 1 tablet by mouth 2 (two) times daily. Start the day prior to foley removal appointment        Followup: He will followup in 1 week for catheter removal and to discuss his surgical pathology results.

## 2022-01-03 ENCOUNTER — Other Ambulatory Visit: Payer: Self-pay | Admitting: Family Medicine

## 2022-01-03 ENCOUNTER — Telehealth: Payer: Self-pay

## 2022-01-03 MED ORDER — AMOXICILLIN 875 MG PO TABS: 875.0000 mg | ORAL_TABLET | Freq: Two times a day (BID) | ORAL | 0 refills | Status: AC

## 2022-01-03 NOTE — Telephone Encounter (Signed)
Pt called and states he had his prostate removed on 12/28/2021. Pt c/o cough and sinus congestion/pressure for the last 2 days. Pt asks if there is any way [facility] Thank you.

## 2022-01-05 LAB — SURGICAL PATHOLOGY

## 2022-01-07 ENCOUNTER — Encounter: Payer: Self-pay | Admitting: Cardiology

## 2022-01-07 ENCOUNTER — Ambulatory Visit: Payer: PPO | Attending: Cardiology | Admitting: Cardiology

## 2022-01-07 VITALS — BP 118/68 | HR 79 | Ht 72.0 in | Wt 211.2 lb

## 2022-01-07 DIAGNOSIS — I10 Essential (primary) hypertension: Secondary | ICD-10-CM

## 2022-01-07 DIAGNOSIS — I2510 Atherosclerotic heart disease of native coronary artery without angina pectoris: Secondary | ICD-10-CM

## 2022-01-07 DIAGNOSIS — E7800 Pure hypercholesterolemia, unspecified: Secondary | ICD-10-CM | POA: Diagnosis not present

## 2022-01-07 NOTE — Patient Instructions (Signed)
Medication Instructions:   Your physician recommends that you continue on your current medications as directed. Please refer to the Current Medication list given to you today.  *If you need a refill on your cardiac medications before your next appointment, please call your pharmacy*   Lab Work:  None Ordered  If you have labs (blood work) drawn today and your tests are completely normal, you will receive your results only by: [place] (if you have MyChart) OR A paper copy in the mail If you have any lab test that is abnormal or we need to change your treatment, we will call you to review the results.   Testing/Procedures:  None Ordered   Follow-Up: At [facility], you and your health needs are our priority.  As part of our continuing mission to provide you with exceptional heart care, we have created designated Provider Care Teams.  These Care Teams include your primary Cardiologist (physician) and Advanced Practice Providers (APPs -  Physician Assistants and Nurse Practitioners) who all work together to provide you with the care you need, when you need it.  We recommend signing up for the patient portal called "MyChart".  Sign up information is provided on this After Visit Summary.  MyChart is used to connect with patients for Virtual Visits (Telemedicine).  Patients are able to view lab/test results, encounter notes, upcoming appointments, etc.  Non-urgent messages can be sent to your provider as well.   To learn more about what you can do with MyChart, go to [email or web address].    Your next appointment:   12 month(s)  The format for your next appointment:   In Person  Provider:   You may see Kate Sable, MD or one of the following Advanced Practice Providers on your designated Care Team:   Murray Hodgkins, NP Christell Faith, PA-C Cadence Kathlen Mody, PA-C Gerrie Nordmann, NP

## 2022-01-07 NOTE — Progress Notes (Signed)
Cardiology Office Note:    Date:  01/07/2022   ID:  [name], DOB [birth year], MRN [id number]  PCP:  Susy Frizzle, MD   [facility] Providers Cardiologist:  Kate Sable, MD     Referring MD: Susy Frizzle, MD   Chief Complaint  Patient presents with   Follow-up    6 month f/u, no new cardiac concerns    History of Present Illness:    [name] is a 71 y.o. male with a hx of CAD s/p PCI p LAD, 09/2020, angina, hypertension, hyperlipidemia, former smoker x30 years who presents for follow-up.  Feels well, denies symptoms of chest pain.  Angina well-controlled with Imdur.  Previously had easy bruisability with Brilinta, this was stopped, bruising symptoms have significantly improved.  Taking all medications as prescribed.  Prior notes Echo 09/28/2020 EF 60 to 65%, impaired relaxation. Repeat LHC 11/2020 patent mid LAD stent, 75% proximal RCA, nondominant vessel. Left heart cath 10/04/2020 proximal to mid LAD 80% stenosis, s/p drug-eluting stent.  RCA 75% stenosis, small nondominant.  Not PCI target. Coronary CTA 09/2020, calcium score 728, significant proximal LAD disease.  Moderate proximal RCA disease.   Past Medical History:  Diagnosis Date   Adenomatous colon polyp 2004   Allergy    Arthritis    Diverticulosis 2011   Former smoker    GERD (gastroesophageal reflux disease)    HLD (hyperlipidemia)    Hyperplastic colon polyp 2004   Hypertension    Melanoma in situ [facility])     Past Surgical History:  Procedure Laterality Date   COLONOSCOPY  01/21/2009   SEVERAL    CORONARY STENT INTERVENTION N/A 10/04/2020   Procedure: CORONARY STENT INTERVENTION;  Surgeon: Leonie Man, MD;  Location: [place] [facility];  Service: Cardiovascular;  Laterality: N/A;   LEFT HEART CATH AND CORONARY ANGIOGRAPHY N/A 10/04/2020   Procedure: LEFT HEART CATH AND CORONARY ANGIOGRAPHY;  Surgeon: Leonie Man, MD;  Location: [place] [facility];  Service:  Cardiovascular;  Laterality: N/A;   LEFT HEART CATH AND CORONARY ANGIOGRAPHY Left 11/30/2020   Procedure: LEFT HEART CATH AND CORONARY ANGIOGRAPHY;  Surgeon: Leonie Man, MD;  Location: [place] [facility];  Service: Cardiovascular;  Laterality: Left;   LYMPH NODE DISSECTION Bilateral 12/28/2021   Procedure: PELVIC LYMPH NODE DISSECTION;  Surgeon: Alexis Frock, MD;  Location: [facility];  Service: Urology;  Laterality: Bilateral;   ROBOT ASSISTED LAPAROSCOPIC RADICAL PROSTATECTOMY N/A 12/28/2021   Procedure: XI ROBOTIC ASSISTED LAPAROSCOPIC RADICAL PROSTATECTOMY AND INDOCYANINE GREEN DYE INJECTION;  Surgeon: Alexis Frock, MD;  Location: [facility];  Service: Urology;  Laterality: N/A;  3 HRS   TOOTH EXTRACTION     TOTAL KNEE ARTHROPLASTY Right 04/26/2020    Current Medications: Current Meds  Medication Sig   amoxicillin (AMOXIL) 875 MG tablet Take 1 tablet (875 mg total) by mouth 2 (two) times daily for 10 days.   aspirin EC 81 MG tablet Take 81 mg by mouth daily. Swallow whole.   cetirizine (ZYRTEC) 10 MG tablet TAKE 1 TABLET BY MOUTH EVERY DAY   docusate sodium (COLACE) 100 MG capsule Take 1 capsule (100 mg total) by mouth 2 (two) times daily.   ezetimibe (ZETIA) 10 MG tablet TAKE 1 TABLET BY MOUTH EVERY DAY   HYDROcodone-acetaminophen (NORCO) 5-325 MG tablet Take 1-2 tablets by mouth every 6 (six) hours as needed for moderate pain or severe pain.   isosorbide mononitrate (IMDUR) 30 MG 24 hr tablet Take  0.5 tablets (15 mg total) by mouth daily.   losartan (COZAAR) 50 MG tablet Take 0.5 tablets (25 mg total) by mouth in the morning.   metoprolol succinate (TOPROL-XL) 25 MG 24 hr tablet TAKE 1 TABLET (25 MG TOTAL) BY MOUTH DAILY.   nitroGLYCERIN (NITROSTAT) 0.4 MG SL tablet PLACE 1 TABLET UNDER THE TONGUE EVERY 5 MINUTES AS NEEDED FOR CHEST PAIN.   rosuvastatin (CRESTOR) 40 MG tablet Take 1 tablet (40 mg total) by mouth daily.   sulfamethoxazole-trimethoprim (BACTRIM DS) 800-160 MG tablet  Take 1 tablet by mouth 2 (two) times daily. Start the day prior to foley removal appointment     Allergies:   Levaquin [levofloxacin in d5w]   Social History   Socioeconomic History   Marital status: Married    Spouse name: [name]   Number of children: 2   Years of education: Not on file   Highest education level: Not on file  Occupational History   Occupation: part Scientist, clinical (histocompatibility and immunogenetics): [employer]  Tobacco Use   Smoking status: Former    Packs/day: 1.00    Years: 30.00    Total pack years: 30.00    Types: Cigarettes, Cigars    Quit date: 05/13/1998    Years since quitting: 23.6   Smokeless tobacco: Current    Types: Chew  Vaping Use   Vaping Use: Never used  Substance and Sexual Activity   Alcohol use: Yes    Alcohol/week: 3.0 standard drinks of alcohol    Types: 3 Cans of beer per week   Drug use: No   Sexual activity: Not Currently  Other Topics Concern   Not on file  Social History Narrative   Married x 51 years   2 grandsons   Social Determinants of Health   Financial Resource Strain: Low Risk  (11/09/2021)   Overall Financial Resource Strain (CARDIA)    Difficulty of Paying Living Expenses: Not hard at all  Food Insecurity: No Food Insecurity (12/28/2021)   Hunger Vital Sign    Worried About Running Out of Food in the Last Year: Never true    Ran Out of Food in the Last Year: Never true  Transportation Needs: No Transportation Needs (12/28/2021)   PRAPARE - Hydrologist (Medical): No    Lack of Transportation (Non-Medical): No  Physical Activity: Unknown (11/09/2021)   Exercise Vital Sign    Days of Exercise per Week: 0 days    Minutes of Exercise per Session: Not on file  Recent Concern: Physical Activity - Inactive (11/09/2021)   Exercise Vital Sign    Days of Exercise per Week: 0 days    Minutes of Exercise per Session: 30 min  Stress: No Stress Concern Present (11/09/2021)   [place]    Feeling of Stress : Not at all  Social Connections: Moderately Integrated (11/09/2021)   Social Connection and Isolation Panel [NHANES]    Frequency of Communication with Friends and Family: Twice a week    Frequency of Social Gatherings with Friends and Family: Twice a week    Attends Religious Services: 1 to 4 times per year    Active Member of [employer] or Organizations: No    Attends Music therapist: Never    Marital Status: Married     Family History: The patient's family history includes Diabetes in his daughter; Heart disease in his mother; Lung cancer in his father.  ROS:   Please see the history of present illness.     All other systems reviewed and are negative.  EKGs/Labs/Other Studies Reviewed:    The following studies were reviewed today:   EKG:  EKG is ordered today.  EKG shows normal sinus rhythm, normal ECG   Recent Labs: 07/19/2021: ALT 18 08/06/2021: TSH 4.77 12/18/2021: Platelets 271 12/29/2021: BUN 19; Creatinine, Ser 1.33; Hemoglobin 11.8; Potassium 4.6; Sodium 135  Recent Lipid Panel    Component Value Date/Time   CHOL 93 06/06/2021 0704   TRIG 70 06/06/2021 0704   HDL 36 (L) 06/06/2021 0704   CHOLHDL 2.6 06/06/2021 0704   VLDL 14 06/06/2021 0704   LDLCALC 43 06/06/2021 0704   LDLCALC 86 09/12/2020 1611     Risk Assessment/Calculations:          Physical Exam:    VS:  BP 118/68 (BP Location: Left Arm, Patient Position: Sitting, Cuff Size: Normal)   Pulse 79   Ht 6' (1.829 m)   Wt 211 lb 3.2 oz (95.8 kg)   SpO2 94%   BMI 28.64 kg/m     Wt Readings from Last 3 Encounters:  01/07/22 211 lb 3.2 oz (95.8 kg)  12/28/21 213 lb (96.6 kg)  12/18/21 213 lb (96.6 kg)     GEN:  Well nourished, well developed in no acute distress HEENT: Normal NECK: No JVD; No carotid bruits CARDIAC: RRR, no murmurs, rubs, gallops RESPIRATORY: Breath sounds appear diminished at bases, clear anteriorly ABDOMEN:  Soft, non-tender, non-distended MUSCULOSKELETAL:  No edema; No deformity  SKIN: Warm and dry NEUROLOGIC:  Alert and oriented x 3 PSYCHIATRIC:  Normal affect   ASSESSMENT:    1. Coronary artery disease involving native coronary artery of native heart, unspecified whether angina present   2. Pure hypercholesterolemia   3. Primary hypertension    PLAN:    In order of problems listed above:  CAD, s/p DES to proximal LAD 09/2020.  Repeat LHC 11/2020 patent mid LAD stent, 75% proximal RCA, nondominant vessel.  echo with preserved EF 60 to 65% impaired relaxation.  Denies chest pain.  Continue aspirin, Crestor, Zetia, Toprol-XL, Imdur 15 mg daily. Hyperlipidemia, LDL at goal, continue Crestor, Zetia. Hypertension, BP controlled.  Continue losartan 25 mg daily, Toprol-XL, Imdur.  Follow-up in 12 months.  Medication Adjustments/Labs and Tests Ordered: Current medicines are reviewed at length with the patient today.  Concerns regarding medicines are outlined above.  Orders Placed This Encounter  Procedures   EKG 12-Lead       No orders of the defined types were placed in this encounter.    Signed, Kate Sable, MD  01/07/2022 3:31 PM    [place]

## 2022-01-28 ENCOUNTER — Other Ambulatory Visit: Payer: Self-pay

## 2022-01-28 ENCOUNTER — Other Ambulatory Visit: Payer: Self-pay | Admitting: Family Medicine

## 2022-01-28 MED ORDER — ISOSORBIDE MONONITRATE ER 30 MG PO TB24: 15.0000 mg | ORAL_TABLET | Freq: Every day | ORAL | 2 refills | Status: DC

## 2022-01-29 NOTE — Telephone Encounter (Signed)
Requested Prescriptions  Pending Prescriptions Disp Refills   cetirizine (ZYRTEC) 10 MG tablet [Pharmacy Med Name: CETIRIZINE HCL 10 MG TABLET] 90 tablet 0    Sig: TAKE 1 TABLET BY MOUTH EVERY DAY     Ear, Nose, and Throat:  Antihistamines 2 Failed - 01/28/2022 11:35 AM      Failed - Cr in normal range and within 360 days    Creat  Date Value Ref Range Status  08/06/2021 1.56 (H) 0.70 - 1.28 mg/dL Final   Creatinine, Ser  Date Value Ref Range Status  12/29/2021 1.33 (H) 0.61 - 1.24 mg/dL Final         Passed - Valid encounter within last 12 months    Recent Outpatient Visits           9 months ago Tinea corporis   [place] Susy Frizzle, MD   11 months ago Viral upper respiratory tract infection   [place] Pickard, Cammie Mcgee, MD   1 year ago Viral URI   [place] Dennard Schaumann, Cammie Mcgee, MD   1 year ago Chest pain, unspecified type   [place] Susy Frizzle, MD   1 year ago Strain of lumbar region, initial encounter   [place] Pickard, Cammie Mcgee, MD

## 2022-01-31 NOTE — Progress Notes (Signed)
Care Management & Coordination Services Pharmacy Note  02/06/2022 Name:  [name] MRN:  [id number] DOB:  [birth year]  Summary: PharmD FU visit.  Patient recovering from recent prostatectomy.  He complains of urge incontinence and leakage particularly when he strains in that area.  This is concerning to him understandable.  Recommendations/Changes made from today's visit: Start PT plan, consider oxybutynin  Follow up plan: FU 6 months CMA to FU on Crestor adherence   Subjective: [name] is an 72 y.o. year old male who is a primary patient of Pickard, Cammie Mcgee, MD.  The care coordination team was consulted for assistance with disease management and care coordination needs.    Engaged with patient by telephone for follow up visit.  Recent office visits:  10/04/21 Michael Luo, MD - Herpes Zoster -  Hydrocodone-acetaminophen (NORCO) 5-325 MG tablet and valACYclovir (VALTREX) 1000 MG tablet prescribed. Follow up in 1 week.    Recent consult visits:  None noted.   Hospital visits: 10/08/21 - 10/09/21 Medication Reconciliation was completed by comparing discharge summary, patient's EMR and Pharmacy list, and upon discussion with patient.   Admitted to the hospital on 10/08/21 due to Chest pain. Discharge date was 10/09/21. Discharged from [facility]?Medications Started at [facility] Discharge:?? None noted.    Medication Changes at Hospital Discharge: None noted   Medications Discontinued at Hospital Discharge: None noted   Medications that remain the same after Hospital Discharge:??  All other medications will remain the same.     Objective:  Lab Results  Component Value Date   CREATININE 1.33 (H) 12/29/2021   BUN 19 12/29/2021   EGFR 47 (L) 08/06/2021   GFRNONAA 56 (L) 12/29/2021   GFRAA 62 04/03/2020   NA 135 12/29/2021   K 4.6 12/29/2021   CALCIUM 8.5 (L) 12/29/2021   CO2 22 12/29/2021   GLUCOSE 147 (H) 12/29/2021    No results  found for: "HGBA1C", "FRUCTOSAMINE", "GFR", "MICROALBUR"  Last diabetic Eye exam: No results found for: "HMDIABEYEEXA"  Last diabetic Foot exam: No results found for: "HMDIABFOOTEX"   Lab Results  Component Value Date   CHOL 93 06/06/2021   HDL 36 (L) 06/06/2021   LDLCALC 43 06/06/2021   TRIG 70 06/06/2021   CHOLHDL 2.6 06/06/2021       Latest Ref Rng & Units 07/19/2021    4:42 PM 09/12/2020    4:11 PM 04/03/2020    4:32 PM  Hepatic Function  Total Protein 6.1 - 8.1 g/dL 6.1  6.4  6.6   AST 10 - 35 U/L '17  12  24   '$ ALT 9 - 46 U/L '18  11  25   '$ Total Bilirubin 0.2 - 1.2 mg/dL 0.5  0.7  0.7     Lab Results  Component Value Date/Time   TSH 4.77 (H) 08/06/2021 04:48 PM   TSH 6.14 (H) 07/19/2021 04:42 PM   FREET4 1.0 08/06/2021 04:48 PM       Latest Ref Rng & Units 12/29/2021    4:14 AM 12/28/2021    1:00 PM 12/18/2021    8:04 AM  CBC  WBC 4.0 - 10.5 K/uL   7.7   Hemoglobin 13.0 - 17.0 g/dL 11.8  12.6  14.4   Hematocrit 39.0 - 52.0 % 36.6  39.6  44.5   Platelets 150 - 400 K/uL   271     Lab Results  Component Value Date/Time   VITAMINB12 444 07/19/2021  04:42 PM    Clinical ASCVD: No  The ASCVD Risk score (Arnett DK, et al., 2019) failed to calculate for the following reasons:   The valid total cholesterol range is 130 to 320 mg/dL       11/09/2021    2:55 PM 11/09/2020    2:00 PM 11/16/2019    2:46 PM  Depression screen PHQ 2/9  Decreased Interest 0 0 0  Down, Depressed, Hopeless 0 0 0  PHQ - 2 Score 0 0 0     Social History   Tobacco Use  Smoking Status Former   Packs/day: 1.00   Years: 30.00   Total pack years: 30.00   Types: Cigarettes, Cigars   Quit date: 05/13/1998   Years since quitting: 23.7  Smokeless Tobacco Current   Types: Chew   BP Readings from Last 3 Encounters:  01/07/22 118/68  12/29/21 128/65  12/18/21 (!) 141/85   Pulse Readings from Last 3 Encounters:  01/07/22 79  12/29/21 72  12/18/21 64   Wt Readings from Last 3  Encounters:  01/07/22 211 lb 3.2 oz (95.8 kg)  12/28/21 213 lb (96.6 kg)  12/18/21 213 lb (96.6 kg)   BMI Readings from Last 3 Encounters:  01/07/22 28.64 kg/m  12/28/21 28.89 kg/m  12/18/21 28.89 kg/m    Allergies  Allergen Reactions   Levaquin [Levofloxacin In D5w] Shortness Of Breath    Medications Reviewed Today     Reviewed by Kate Sable, MD (Physician) on 01/07/22 at 37  Med List Status: <None>   Medication Order Taking? Sig Documenting Provider Last Dose Status Informant  amoxicillin (AMOXIL) 875 MG tablet [id number] Yes Take 1 tablet (875 mg total) by mouth 2 (two) times daily for 10 days. Susy Frizzle, MD Taking Active   aspirin EC 81 MG tablet [id number] Yes Take 81 mg by mouth daily. Swallow whole. [provider] Taking Active Self  cetirizine (ZYRTEC) 10 MG tablet [id number] Yes TAKE 1 TABLET BY MOUTH EVERY DAY Susy Frizzle, MD Taking Active Self  docusate sodium (COLACE) 100 MG capsule [id number] Yes Take 1 capsule (100 mg total) by mouth 2 (two) times daily. Debbrah Alar, PA-C Taking Active   ezetimibe (ZETIA) 10 MG tablet [id number] Yes TAKE 1 TABLET BY MOUTH EVERY DAY Susy Frizzle, MD Taking Active Self  HYDROcodone-acetaminophen (NORCO) 5-325 MG tablet [id number] Yes Take 1-2 tablets by mouth every 6 (six) hours as needed for moderate pain or severe pain. Raynelle Bring, MD Taking Active   isosorbide mononitrate (IMDUR) 30 MG 24 hr tablet [id number] Yes Take 0.5 tablets (15 mg total) by mouth daily. Kate Sable, MD Taking Active Self  losartan (COZAAR) 50 MG tablet [id number] Yes Take 0.5 tablets (25 mg total) by mouth in the morning. Kate Sable, MD Taking Active Self  metoprolol succinate (TOPROL-XL) 25 MG 24 hr tablet [id number] Yes TAKE 1 TABLET (25 MG TOTAL) BY MOUTH DAILY. Susy Frizzle, MD Taking Active Self  nitroGLYCERIN (NITROSTAT) 0.4 MG SL tablet [id number] Yes PLACE 1 TABLET UNDER THE TONGUE EVERY 5 MINUTES AS  NEEDED FOR CHEST PAIN. Susy Frizzle, MD Taking Active Self  rosuvastatin (CRESTOR) 40 MG tablet [id number] Yes Take 1 tablet (40 mg total) by mouth daily. Kate Sable, MD Taking Active Self  sulfamethoxazole-trimethoprim (BACTRIM DS) 800-160 MG tablet [id number] Yes Take 1 tablet by mouth 2 (two) times daily. Start the day prior to foley removal appointment Debbrah Alar, PA-C Taking Active  SDOH:  (Social Determinants of Health) assessments and interventions performed: No, done within the last year Financial Resource Strain: Low Risk  (11/09/2021)   Overall Financial Resource Strain (CARDIA)    Difficulty of Paying Living Expenses: Not hard at all     SDOH Interventions    Flowsheet Row Clinical Support from 11/09/2021 in [facility] from 11/09/2020 in [place] Interventions    Food Insecurity Interventions Intervention Not Indicated Intervention Not Indicated  Housing Interventions Intervention Not Indicated Intervention Not Indicated  Transportation Interventions Intervention Not Indicated Intervention Not Indicated  Utilities Interventions Intervention Not Indicated --  Alcohol Usage Interventions Intervention Not Indicated (Score <7) --  Financial Strain Interventions Intervention Not Indicated Intervention Not Indicated  Physical Activity Interventions Intervention Not Indicated Intervention Not Indicated  Stress Interventions Intervention Not Indicated Intervention Not Indicated  Social Connections Interventions Intervention Not Indicated Intervention Not Indicated      SDOH Screenings   Food Insecurity: No Food Insecurity (12/28/2021)  Housing: Low Risk  (12/28/2021)  Transportation Needs: No Transportation Needs (12/28/2021)  Utilities: Not At Risk (12/28/2021)  Alcohol Screen: Low Risk  (11/09/2021)  Depression (PHQ2-9): Low Risk  (11/09/2021)  Financial Resource Strain: Low Risk   (11/09/2021)  Physical Activity: Unknown (11/09/2021)  Recent Concern: Physical Activity - Inactive (11/09/2021)  Social Connections: Moderately Integrated (11/09/2021)  Stress: No Stress Concern Present (11/09/2021)  Tobacco Use: High Risk (01/07/2022)    Medication Assistance: None required.  Patient affirms current coverage meets needs.  Medication Access: Within the past 30 days, how often has patient missed a dose of medication? 0 Is a pillbox or other method used to improve adherence? Yes  Factors that may affect medication adherence? no barriers identified Are meds synced by current pharmacy? No  Are meds delivered by current pharmacy? No  Does patient experience delays in picking up medications due to transportation concerns? No   [facility] Services Reviewed: Is patient disadvantaged to use [facility]?: No  Current Rx insurance plan: HTA Name and location of Current pharmacy:  [id number] [place] [id number]Phone: [phone]: [phone] [id number] [place], [place] [address]: [phone]: [phone]: [phone]: [address] [place]one: [phone]: [id number] [address]: [phone]: [phone] [facility] [address]hone: [phone]: [phone] Pharm[facility] services reviewed with  patient today?: Yes  Patient requests to transfer care to [facility] Pharmacy?: No  Reason patient declined to change pharmacies: Loyalty to other pharmacy/Patient preference  Compliance/Adherence/Medication fill history: Care Gaps: None  Star-Rating Drugs: Losartan '50mg'$  01/28/22 90ds Crestor - no fill data patient reports supply on hand    Assessment/Plan     Hypertension (BP goal <140/90) 08/09/21 -Controlled, not assessed today -Current treatment: Losartan '50mg'$  Appropriate, Effective, Safe, Accessible Metoprolol XL '50mg'$  Appropriate, Effective, Safe, Accessible -Medications previously tried: Metoprolol  XL, Imdur (headaches) -Current home readings: BP slightly on the higher side per his report, no logs discussed -Denies hypotensive/hypertensive symptoms -Educated on BP goals and benefits of medications for prevention of heart attack, stroke and kidney damage; Exercise goal of 150 minutes per week; Importance of home blood pressure monitoring; Symptoms of hypotension and importance of maintaining adequate hydration; -Counseled to monitor BP at home daily, document, and provide log at future appointments -Denies any dizziness at this time.  His BP was controlled at goal during last OV. -Continue to follow and adjust as needed, no changes at this time.  Update 12/28/20 120-130/80s.  Not having anymore dizziness. He has stopped the Imdur completely due to severe headaches which was OK's by cardiology Reports feeling well overall, encouraged physical activity, etc. Continue current meds for now - continue to monitor BP at home. Report consistent readings > 130/80 to providers.  Urinary frequency, recent prostatectomy (Goal: Reduce symptoms) 02/06/22 -Uncontrolled -Current treatment  None -Medications previously tried: none -Patient mentions that since his surgery he has some accidents when straining in his groin area.  This happens while lifting something, walking up stairs,  etc.   -He is wearing depends but the symptoms are still very problematic which is understandable. -He has upcoming initial visit with physical therapy - recommend working on kegel type exercises to strengthen muscles.  - Recommended to continue with physical therapy.  Could consider something like oxybutynin for urge incontinence - will consult with PCP.  Hyperlipidemia/CAD: (LDL goal < 70) 02/06/22 Controlled, based on most recent LDL of 43 -Current treatment: Zetia '10mg'$  daily Appropriate, Effective, Safe, Accessible Rosuvastatin '40mg'$  Appropriate, Effective, Safe, Accessible -Medications previously tried: Atorvastatin (aches)  -Educated on Cholesterol goals;  Importance of limiting foods high in cholesterol; Exercise goal of 150 minutes per week;  -Recommended to continue current medication -Kidneys still functioning fine for current dose of Crestor. -Tolerating medications well and no issues with adherence. No changes at this time - continue current meds.  Update 12/29/20 LDL still above goal < 70 with CAD He cannot tolerate statins, adherent with Zetia currently Recommend continue to monitor. ASCVD risk is HIGH. Could possibly consider Repatha pending cost and patient willingness. Will address at next visit. No changes at this time.   Patient Goals/Self-Care Activities Patient will:  - take medications as prescribed focus on medication adherence by pill counts check blood pressure daily, document, and provide at future appointments target a minimum of 150 minutes of moderate intensity exercise weekly  Follow Up Plan: The care management team will reach out to the patient again over the next 120 days.            Beverly Milch, PharmD, CPP Clinical Pharmacist Practitioner [place] [phone]

## 2022-02-06 ENCOUNTER — Ambulatory Visit: Payer: PPO | Admitting: Pharmacist

## 2022-02-12 DIAGNOSIS — D3610 Benign neoplasm of peripheral nerves and autonomic nervous system, unspecified: Secondary | ICD-10-CM | POA: Diagnosis not present

## 2022-02-13 ENCOUNTER — Other Ambulatory Visit: Payer: Self-pay | Admitting: Pediatrics

## 2022-02-13 DIAGNOSIS — D3610 Benign neoplasm of peripheral nerves and autonomic nervous system, unspecified: Secondary | ICD-10-CM

## 2022-02-14 ENCOUNTER — Other Ambulatory Visit: Payer: Self-pay | Admitting: Neurosurgery

## 2022-02-14 DIAGNOSIS — D3610 Benign neoplasm of peripheral nerves and autonomic nervous system, unspecified: Secondary | ICD-10-CM

## 2022-02-19 DIAGNOSIS — M25572 Pain in left ankle and joints of left foot: Secondary | ICD-10-CM | POA: Diagnosis not present

## 2022-02-19 DIAGNOSIS — M76822 Posterior tibial tendinitis, left leg: Secondary | ICD-10-CM | POA: Diagnosis not present

## 2022-02-20 DIAGNOSIS — R350 Frequency of micturition: Secondary | ICD-10-CM | POA: Diagnosis not present

## 2022-02-20 DIAGNOSIS — R278 Other lack of coordination: Secondary | ICD-10-CM | POA: Diagnosis not present

## 2022-02-20 DIAGNOSIS — N393 Stress incontinence (female) (male): Secondary | ICD-10-CM | POA: Diagnosis not present

## 2022-02-28 DIAGNOSIS — M62838 Other muscle spasm: Secondary | ICD-10-CM | POA: Diagnosis not present

## 2022-02-28 DIAGNOSIS — N393 Stress incontinence (female) (male): Secondary | ICD-10-CM | POA: Diagnosis not present

## 2022-02-28 DIAGNOSIS — M6281 Muscle weakness (generalized): Secondary | ICD-10-CM | POA: Diagnosis not present

## 2022-03-12 ENCOUNTER — Ambulatory Visit
Admission: RE | Admit: 2022-03-12 | Discharge: 2022-03-12 | Disposition: A | Discharge status: Home / Self Care | Payer: PPO | Source: Ambulatory Visit | Attending: Neurosurgery | Admitting: Neurosurgery

## 2022-03-12 DIAGNOSIS — D3610 Benign neoplasm of peripheral nerves and autonomic nervous system, unspecified: Secondary | ICD-10-CM

## 2022-03-12 MED ORDER — GADOPICLENOL 0.5 MMOL/ML IV SOLN
10.0000 mL | Freq: Once | INTRAVENOUS | Status: AC | PRN
  Administered 2022-03-12: 10 mL via INTRAVENOUS

## 2022-03-13 DIAGNOSIS — N393 Stress incontinence (female) (male): Secondary | ICD-10-CM | POA: Diagnosis not present

## 2022-03-13 DIAGNOSIS — M6281 Muscle weakness (generalized): Secondary | ICD-10-CM | POA: Diagnosis not present

## 2022-03-13 DIAGNOSIS — M62838 Other muscle spasm: Secondary | ICD-10-CM | POA: Diagnosis not present

## 2022-03-14 DIAGNOSIS — D3610 Benign neoplasm of peripheral nerves and autonomic nervous system, unspecified: Secondary | ICD-10-CM | POA: Diagnosis not present

## 2022-03-14 DIAGNOSIS — Z6828 Body mass index (BMI) 28.0-28.9, adult: Secondary | ICD-10-CM | POA: Diagnosis not present

## 2022-04-08 DIAGNOSIS — N5231 Erectile dysfunction following radical prostatectomy: Secondary | ICD-10-CM | POA: Diagnosis not present

## 2022-04-08 DIAGNOSIS — C637 Malignant neoplasm of other specified male genital organs: Secondary | ICD-10-CM | POA: Diagnosis not present

## 2022-04-08 DIAGNOSIS — N393 Stress incontinence (female) (male): Secondary | ICD-10-CM | POA: Diagnosis not present

## 2022-04-08 DIAGNOSIS — C495 Malignant neoplasm of connective and soft tissue of pelvis: Secondary | ICD-10-CM | POA: Diagnosis not present

## 2022-04-09 ENCOUNTER — Telehealth: Payer: Self-pay | Admitting: Pharmacist

## 2022-04-09 NOTE — Progress Notes (Signed)
Care Management & Coordination Services Pharmacy Team  Reason for Encounter: Hypertension  Contacted patient to discuss hypertension disease state. Spoke with patient on 04/09/2022     Current antihypertensive regimen:  Losartan 50mg   Metoprolol XL 50mg   Patient verbally confirms he is taking the above medications as directed. Yes  How often are you checking your Blood Pressure? weekly  he checks his blood pressure  he has not been checking his bloos pressures as his wife is currently in the hospital.    Current home BP readings: 130/75 (range)  DATE:             BP               PULSE   Wrist or arm cuff: Caffeine intake: Salt intake: OTC medications including pseudoephedrine or NSAIDs?  Any readings above 180/100? No If yes any symptoms of hypertensive emergency? patient denies any symptoms of high blood pressure  What recent interventions/DTPs have been made by any provider to improve Blood Pressure control since last CPP Visit:  Patient reported no recent changes in medication regimen.   Any recent hospitalizations or ED visits since last visit with CPP? Yes  What diet changes have been made to improve Blood Pressure Control?  He reported he has been watching his diet and limiting his salt intake.  What exercise is being done to improve your Blood Pressure Control?  Patient reported he has been walking a lot from the parking deck to the hospital.   Adherence Review: Is the patient currently on ACE/ARB medication? Yes Does the patient have >5 day gap between last estimated fill dates? Yes  Star Rating Drugs:  Medication:   Last Fill: Day Supply  Losartan 50mg    01/28/22  90 Rosuvastatin 40 MG tablet  07/23/21  90   Chart Updates: Recent office visits:  None noted.  Recent consult visits:  03/14/22 Ashok Pall, MD - Spine and Ortho - No notes available  02/12/22 Ashok Pall, MD - Spine and Ortho - No notes available   Hospital visits:  Medication  Reconciliation was completed by comparing discharge summary, patient's EMR and Pharmacy list, and upon discussion with patient.  Admitted to the hospital on 12/28/21 due to Prostate Cancer. Discharge date was 12/29/21. Discharged from [facility]?Medications Started at [facility] Discharge:?? docusate sodium (COLACE) sulfamethoxazole-trimethoprim (BACTRIM DS)  Medication Changes at Hospital Discharge: HYDROcodone-acetaminophen (Norco)  Medications Discontinued at Hospital Discharge: clopidogrel 75 MG tablet (PLAVIX) tiZANidine 4 MG tablet (Zanaflex)  Medications that remain the same after Hospital Discharge:??  All other medications will remain the same.     Medications: Outpatient Encounter Medications as of 04/09/2022  Medication Sig   aspirin EC 81 MG tablet Take 81 mg by mouth daily. Swallow whole.   cetirizine (ZYRTEC) 10 MG tablet TAKE 1 TABLET BY MOUTH EVERY DAY   docusate sodium (COLACE) 100 MG capsule Take 1 capsule (100 mg total) by mouth 2 (two) times daily.   ezetimibe (ZETIA) 10 MG tablet TAKE 1 TABLET BY MOUTH EVERY DAY   HYDROcodone-acetaminophen (NORCO) 5-325 MG tablet Take 1-2 tablets by mouth every 6 (six) hours as needed for moderate pain or severe pain.   isosorbide mononitrate (IMDUR) 30 MG 24 hr tablet Take 0.5 tablets (15 mg total) by mouth daily.   losartan (COZAAR) 50 MG tablet Take 0.5 tablets (25 mg total) by mouth in the morning.   metoprolol succinate (TOPROL-XL) 25 MG 24 hr tablet TAKE 1 TABLET (  25 MG TOTAL) BY MOUTH DAILY.   nitroGLYCERIN (NITROSTAT) 0.4 MG SL tablet PLACE 1 TABLET UNDER THE TONGUE EVERY 5 MINUTES AS NEEDED FOR CHEST PAIN.   rosuvastatin (CRESTOR) 40 MG tablet Take 1 tablet (40 mg total) by mouth daily.   sulfamethoxazole-trimethoprim (BACTRIM DS) 800-160 MG tablet Take 1 tablet by mouth 2 (two) times daily. Start the day prior to foley removal appointment   No facility-administered encounter medications on file as of  04/09/2022.    Recent Office Vitals: BP Readings from Last 3 Encounters:  01/07/22 118/68  12/29/21 128/65  12/18/21 (!) 141/85   Pulse Readings from Last 3 Encounters:  01/07/22 79  12/29/21 72  12/18/21 64    Wt Readings from Last 3 Encounters:  01/07/22 211 lb 3.2 oz (95.8 kg)  12/28/21 213 lb (96.6 kg)  12/18/21 213 lb (96.6 kg)     Kidney Function Lab Results  Component Value Date/Time   CREATININE 1.33 (H) 12/29/2021 04:14 AM   CREATININE 1.33 (H) 12/18/2021 08:04 AM   CREATININE 1.56 (H) 08/06/2021 04:48 PM   CREATININE 1.36 (H) 07/19/2021 04:42 PM   GFRNONAA 56 (L) 12/29/2021 04:14 AM   GFRNONAA 53 (L) 04/03/2020 04:32 PM   GFRAA 62 04/03/2020 04:32 PM       Latest Ref Rng & Units 12/29/2021    4:14 AM 12/18/2021    8:04 AM 10/08/2021   12:09 AM  BMP  Glucose 70 - 99 mg/dL 147  106  114   BUN 8 - 23 mg/dL 19  21  18    Creatinine 0.61 - 1.24 mg/dL 1.33  1.33  1.13   Sodium 135 - 145 mmol/L 135  141  136   Potassium 3.5 - 5.1 mmol/L 4.6  4.8  4.2   Chloride 98 - 111 mmol/L 107  108  107   CO2 22 - 32 mmol/L 22  25  24    Calcium 8.9 - 10.3 mg/dL 8.5  9.1  9.0      Future Appointments  Date Time Provider [place]  05/09/2022  3:30 PM Flora Lipps, MD [facility] None  08/07/2022  2:00 PM Edythe Clarity, [place] [facility] None    [facility]

## 2022-04-28 ENCOUNTER — Other Ambulatory Visit: Payer: Self-pay | Admitting: Family Medicine

## 2022-04-29 ENCOUNTER — Other Ambulatory Visit: Payer: Self-pay

## 2022-04-29 MED ORDER — LOSARTAN POTASSIUM 50 MG PO TABS: 25.0000 mg | ORAL_TABLET | Freq: Every morning | ORAL | 1 refills | Status: DC

## 2022-04-30 NOTE — Telephone Encounter (Signed)
LOV 10/03/21, patient needs to schedule OV for additional refills.  Requested Prescriptions  Pending Prescriptions Disp Refills   cetirizine (ZYRTEC) 10 MG tablet [Pharmacy Med Name: CETIRIZINE HCL 10 MG TABLET] 90 tablet 0    Sig: TAKE 1 TABLET BY MOUTH EVERY DAY     Ear, Nose, and Throat:  Antihistamines 2 Failed - 04/28/2022  9:45 AM      Failed - Cr in normal range and within 360 days    Creat  Date Value Ref Range Status  08/06/2021 1.56 (H) 0.70 - 1.28 mg/dL Final   Creatinine, Ser  Date Value Ref Range Status  12/29/2021 1.33 (H) 0.61 - 1.24 mg/dL Final         Failed - Valid encounter within last 12 months    Recent Outpatient Visits           1 year ago Tinea corporis   [facility] [name], MD   1 year ago Viral upper respiratory tract infection   [facility] [name], MD   1 year ago Viral URI   [facility] Tanya Nones, Priscille Heidelberg, MD   1 year ago Chest pain, unspecified type   [facility] [name], MD   1 year ago Strain of lumbar region, initial encounter   [facility] Pickard, Priscille Heidelberg, MD       Future Appointments             In 1 week Erin Fulling, MD [facility] at [facility]

## 2022-05-09 ENCOUNTER — Ambulatory Visit: Payer: PPO | Admitting: Internal Medicine

## 2022-05-24 ENCOUNTER — Institutional Professional Consult (permissible substitution): Payer: PPO | Admitting: Thoracic Surgery (Cardiothoracic Vascular Surgery)

## 2022-05-24 VITALS — BP 109/63 | HR 72 | Resp 20 | Ht 72.0 in | Wt 216.0 lb

## 2022-05-24 DIAGNOSIS — D3610 Benign neoplasm of peripheral nerves and autonomic nervous system, unspecified: Secondary | ICD-10-CM

## 2022-05-24 NOTE — Progress Notes (Signed)
[address]       [place] [address]             [phone]                    [name] [facility] Medical Record #[id number] Date of Birth: Sep 14, 1950  Referring: [name], MD Primary Care: [name], MD Primary Cardiologist: [name], MD  Chief Complaint:    Chief Complaint  Patient presents with   Consult    MR thoracic 2/20    History of Present Illness:    [name] 72 y.o. male presents for surgical evaluation of a spinal mass.  This was found incidentally.  He is asymptomatic.      Past Medical History:  Diagnosis Date   Adenomatous colon polyp 2004   Allergy    Arthritis    Diverticulosis 2011   Former smoker    GERD (gastroesophageal reflux disease)    HLD (hyperlipidemia)    Hyperplastic colon polyp 2004   Hypertension    Melanoma in situ [facility])     Past Surgical History:  Procedure Laterality Date   COLONOSCOPY  01/21/2009   SEVERAL    CORONARY STENT INTERVENTION N/A 10/04/2020   Procedure: CORONARY STENT INTERVENTION;  Surgeon: Marykay Lex, MD;  Location: [facility] INVASIVE CV LAB;  Service: Cardiovascular;  Laterality: N/A;   LEFT HEART CATH AND CORONARY ANGIOGRAPHY N/A 10/04/2020   Procedure: LEFT HEART CATH AND CORONARY ANGIOGRAPHY;  Surgeon: Marykay Lex, MD;  Location: [facility] INVASIVE CV LAB;  Service: Cardiovascular;  Laterality: N/A;   LEFT HEART CATH AND CORONARY ANGIOGRAPHY Left 11/30/2020   Procedure: LEFT HEART CATH AND CORONARY ANGIOGRAPHY;  Surgeon: Marykay Lex, MD;  Location: [facility] INVASIVE CV LAB;  Service: Cardiovascular;  Laterality: Left;   LYMPH NODE DISSECTION Bilateral 12/28/2021   Procedure: PELVIC LYMPH NODE DISSECTION;  Surgeon: [name], MD;  Location: [facility] ORS;  Service: Urology;  Laterality: Bilateral;   ROBOT ASSISTED LAPAROSCOPIC RADICAL PROSTATECTOMY N/A 12/28/2021   Procedure: XI ROBOTIC ASSISTED LAPAROSCOPIC RADICAL PROSTATECTOMY AND INDOCYANINE GREEN DYE  INJECTION;  Surgeon: [name], MD;  Location: [facility];  Service: Urology;  Laterality: N/A;  3 HRS   TOOTH EXTRACTION     TOTAL KNEE ARTHROPLASTY Right 04/26/2020    Family History  Problem Relation Age of Onset   Heart disease Mother        died in her 60's   Lung cancer Father    Diabetes Daughter      Social History   Tobacco Use  Smoking Status Former   Packs/day: 1.00   Years: 30.00   Additional pack years: 0.00   Total pack years: 30.00   Types: Cigarettes, Cigars   Quit date: 05/13/1998   Years since quitting: 24.2  Smokeless Tobacco Current   Types: Chew    Social History   Substance and Sexual Activity  Alcohol Use Yes   Alcohol/week: 3.0 standard drinks of alcohol   Types: 3 Cans of beer per week     Allergies  Allergen Reactions   Levaquin [Levofloxacin In D5w] Shortness Of Breath    Current Outpatient Medications  Medication Sig Dispense Refill   azithromycin (ZITHROMAX) 250 MG tablet 2 tabs poqday1, 1 tab poqday 2-5 (Patient not taking: Reported on 07/05/2022) 6 tablet 0   cetirizine (ZYRTEC) 10 MG tablet TAKE 1 TABLET BY MOUTH EVERY DAY 90 tablet 0  ezetimibe (ZETIA) 10 MG tablet TAKE 1 TABLET BY MOUTH EVERY DAY 90 tablet 3   isosorbide mononitrate (IMDUR) 30 MG 24 hr tablet Take 0.5 tablets (15 mg total) by mouth daily. 45 tablet 2   losartan (COZAAR) 50 MG tablet Take 0.5 tablets (25 mg total) by mouth in the morning. 45 tablet 1   metoprolol succinate (TOPROL-XL) 25 MG 24 hr tablet TAKE 1 TABLET (25 MG TOTAL) BY MOUTH DAILY. 90 tablet 3   nitroGLYCERIN (NITROSTAT) 0.4 MG SL tablet PLACE 1 TABLET UNDER THE TONGUE EVERY 5 MINUTES AS NEEDED FOR CHEST PAIN. 50 tablet 3   rosuvastatin (CRESTOR) 40 MG tablet Take 1 tablet (40 mg total) by mouth daily. 90 tablet 0   amoxicillin-clavulanate (AUGMENTIN) 875-125 MG tablet Take 1 tablet by mouth 2 (two) times daily. 20 tablet 0   fluticasone (FLONASE) 50 MCG/ACT nasal spray Place 2 sprays into both  nostrils daily. 16 g 6   HYDROcodone bit-homatropine (HYCODAN) 5-1.5 MG/5ML syrup Take 5 mLs by mouth every 6 (six) hours as needed for cough. 120 mL 0   predniSONE (DELTASONE) 20 MG tablet 3 tabs poqday 1-2, 2 tabs poqday 3-4, 1 tab poqday 5-6 12 tablet 0   No current facility-administered medications for this visit.    Review of Systems  Constitutional:  Negative for fever and malaise/fatigue.  Respiratory:  Negative for shortness of breath.   Cardiovascular:  Negative for chest pain.  Musculoskeletal:  Positive for joint pain and myalgias.    PHYSICAL EXAMINATION: BP 109/63 (BP Location: Right Arm, Patient Position: Sitting)   Pulse 72   Resp 20   Ht 6' (1.829 m)   Wt 216 lb (98 kg)   SpO2 96% Comment: RA  BMI 29.29 kg/m   Physical Exam Constitutional:      General: He is not in acute distress.    Appearance: He is not ill-appearing.  HENT:     Head: Normocephalic and atraumatic.  Cardiovascular:     Rate and Rhythm: Normal rate.  Pulmonary:     Effort: Pulmonary effort is normal. No respiratory distress.  Musculoskeletal:        General: Normal range of motion.  Skin:    General: Skin is warm and dry.  Neurological:     Mental Status: He is alert.      Diagnostic Studies & Laboratory data:     Recent Radiology Findings:   No results found.     I have independently reviewed the above radiology studies  and reviewed the findings with the patient.   Recent Lab Findings: Lab Results  Component Value Date   WBC 8.7 06/20/2022   HGB 13.8 06/20/2022   HCT 42.3 06/20/2022   PLT 287 06/20/2022   GLUCOSE 111 (H) 06/20/2022   CHOL 93 06/06/2021   TRIG 70 06/06/2021   HDL 36 (L) 06/06/2021   LDLCALC 43 06/06/2021   ALT 18 06/20/2022   AST 17 06/20/2022   NA 140 06/20/2022   K 4.9 06/20/2022   CL 107 06/20/2022   CREATININE 1.22 06/20/2022   BUN 21 06/20/2022   CO2 28 06/20/2022   TSH 4.77 (H) 08/06/2021   INR 1.1 03/27/2020      IMPRESSION: 1. No  evidence of pulmonary embolism. 2. Smoothly marginated 4.6 x 3.3 x 3.2 cm mass in the superomedial aspect of the right hemithorax which appears to communicate with the neural canal via the right T2 neural foramen, suspicious for a nerve sheath tumor. Further  evaluation with nonemergent thoracic spine MRI with and without IV gadolinium is recommended in the near future to better evaluate this finding. 3. Aortic atherosclerosis, in addition to left main and 2 vessel coronary artery disease. Assessment for potential risk factor modification, dietary therapy or pharmacologic therapy may be warranted, if clinically indicated.    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  Will coordinate combo procedure with Dr. Franky Macho.        [name] 07/31/2022 3:21 PM

## 2022-05-27 ENCOUNTER — Other Ambulatory Visit: Payer: Self-pay | Admitting: *Deleted

## 2022-05-27 ENCOUNTER — Encounter: Payer: Self-pay | Admitting: *Deleted

## 2022-05-27 DIAGNOSIS — D3610 Benign neoplasm of peripheral nerves and autonomic nervous system, unspecified: Secondary | ICD-10-CM

## 2022-05-30 ENCOUNTER — Encounter: Payer: Self-pay | Admitting: *Deleted

## 2022-06-18 ENCOUNTER — Other Ambulatory Visit (HOSPITAL_COMMUNITY): Payer: PPO

## 2022-06-20 ENCOUNTER — Encounter: Payer: Self-pay | Admitting: Family Medicine

## 2022-06-20 ENCOUNTER — Ambulatory Visit (INDEPENDENT_AMBULATORY_CARE_PROVIDER_SITE_OTHER): Payer: PPO | Admitting: Family Medicine

## 2022-06-20 VITALS — BP 108/60 | HR 64 | Temp 97.8°F | Ht 73.0 in | Wt 218.0 lb

## 2022-06-20 DIAGNOSIS — R0982 Postnasal drip: Secondary | ICD-10-CM | POA: Diagnosis not present

## 2022-06-20 DIAGNOSIS — R5383 Other fatigue: Secondary | ICD-10-CM | POA: Insufficient documentation

## 2022-06-20 MED ORDER — FLUTICASONE PROPIONATE 50 MCG/ACT NA SUSP: 2.0000 | Freq: Every day | NASAL | 6 refills | Status: DC

## 2022-06-20 NOTE — Assessment & Plan Note (Signed)
Symptoms consistent with uncomplicated post-nasal drip. Start Fluticasone nasal spray BID and encouraged to use a nasal saline irrigation. If no improvement in symptoms return to office.

## 2022-06-20 NOTE — Progress Notes (Signed)
Acute Office Visit  Subjective:     Patient ID: [name], male    DOB: [birth year], 72 y.o.   MRN: [id number]  Chief Complaint  Patient presents with   Follow-up    bad case of sinus drainage      HPI Patient is in today for sinus drainage for 3-4 months. He describes it as postnasal drip and cough that comes and goes. This happens 3-4 times daily. Denies GERD exacerbation. Denies fever, sinus pressure, rhinorrhea, loss of taste or smell. Has tried daily allergy medication daily.   Review of Systems  All other systems reviewed and are negative.   Past Medical History:  Diagnosis Date   Adenomatous colon polyp 2004   Allergy    Arthritis    Diverticulosis 2011   Former smoker    GERD (gastroesophageal reflux disease)    HLD (hyperlipidemia)    Hyperplastic colon polyp 2004   Hypertension    Melanoma in situ [facility])    Past Surgical History:  Procedure Laterality Date   COLONOSCOPY  01/21/2009   SEVERAL    CORONARY STENT INTERVENTION N/A 10/04/2020   Procedure: CORONARY STENT INTERVENTION;  Surgeon: Marykay Lex, MD;  Location: [facility] INVASIVE CV LAB;  Service: Cardiovascular;  Laterality: N/A;   LEFT HEART CATH AND CORONARY ANGIOGRAPHY N/A 10/04/2020   Procedure: LEFT HEART CATH AND CORONARY ANGIOGRAPHY;  Surgeon: Marykay Lex, MD;  Location: [facility] INVASIVE CV LAB;  Service: Cardiovascular;  Laterality: N/A;   LEFT HEART CATH AND CORONARY ANGIOGRAPHY Left 11/30/2020   Procedure: LEFT HEART CATH AND CORONARY ANGIOGRAPHY;  Surgeon: Marykay Lex, MD;  Location: [facility] INVASIVE CV LAB;  Service: Cardiovascular;  Laterality: Left;   LYMPH NODE DISSECTION Bilateral 12/28/2021   Procedure: PELVIC LYMPH NODE DISSECTION;  Surgeon: [name], MD;  Location: [facility];  Service: Urology;  Laterality: Bilateral;   ROBOT ASSISTED LAPAROSCOPIC RADICAL PROSTATECTOMY N/A 12/28/2021   Procedure: XI ROBOTIC ASSISTED LAPAROSCOPIC RADICAL PROSTATECTOMY AND INDOCYANINE GREEN DYE  INJECTION;  Surgeon: [name], MD;  Location: [facility];  Service: Urology;  Laterality: N/A;  3 HRS   TOOTH EXTRACTION     TOTAL KNEE ARTHROPLASTY Right 04/26/2020   Current Outpatient Medications on File Prior to Visit  Medication Sig Dispense Refill   cetirizine (ZYRTEC) 10 MG tablet TAKE 1 TABLET BY MOUTH EVERY DAY 90 tablet 0   ezetimibe (ZETIA) 10 MG tablet TAKE 1 TABLET BY MOUTH EVERY DAY 90 tablet 3   isosorbide mononitrate (IMDUR) 30 MG 24 hr tablet Take 0.5 tablets (15 mg total) by mouth daily. 45 tablet 2   losartan (COZAAR) 50 MG tablet Take 0.5 tablets (25 mg total) by mouth in the morning. 45 tablet 1   metoprolol succinate (TOPROL-XL) 25 MG 24 hr tablet TAKE 1 TABLET (25 MG TOTAL) BY MOUTH DAILY. 90 tablet 3   nitroGLYCERIN (NITROSTAT) 0.4 MG SL tablet PLACE 1 TABLET UNDER THE TONGUE EVERY 5 MINUTES AS NEEDED FOR CHEST PAIN. 50 tablet 3   rosuvastatin (CRESTOR) 40 MG tablet Take 1 tablet (40 mg total) by mouth daily. 90 tablet 0   No current facility-administered medications on file prior to visit.   Allergies  Allergen Reactions   Levaquin [Levofloxacin In D5w] Shortness Of Breath       Objective:    BP 108/60   Pulse 64   Temp 97.8 F (36.6 C) (Oral)   Ht 6\' 1"  (1.854 m)   Wt 218 lb (98.9 kg)  SpO2 96%   BMI 28.76 kg/m    Physical Exam Vitals and nursing note reviewed.  Constitutional:      Appearance: Normal appearance. He is normal weight.  HENT:     Head: Normocephalic and atraumatic.     Nose: Nose normal.     Mouth/Throat:     Mouth: Mucous membranes are moist.     Pharynx: Oropharynx is clear.  Skin:    General: Skin is warm and dry.     Capillary Refill: Capillary refill takes less than 2 seconds.  Neurological:     General: No focal deficit present.     Mental Status: He is alert and oriented to person, place, and time. Mental status is at baseline.  Psychiatric:        Mood and Affect: Mood normal.        Behavior: Behavior normal.         Thought Content: Thought content normal.        Judgment: Judgment normal.     No results found for any visits on 06/20/22.      Assessment & Plan:   Problem List Items Addressed This Visit     Post-nasal drip - Primary    Symptoms consistent with uncomplicated post-nasal drip. Start Fluticasone nasal spray BID and encouraged to use a nasal saline irrigation. If no improvement in symptoms return to office.      Fatigue    Unable to dive into this problem, it was briefly mentioned at the end of this time limited visit for another problem. He would like to have labs drawn today. I encouraged him to make another appt with PCP for his ongoing fatigue.      Relevant Orders   CBC with Differential/Platelet   COMPLETE METABOLIC PANEL WITH GFR    Meds ordered this encounter  Medications   fluticasone (FLONASE) 50 MCG/ACT nasal spray    Sig: Place 2 sprays into both nostrils daily.    Dispense:  16 g    Refill:  6    Order Specific Question:   Supervising Provider    Answer:   Lynnea Ferrier T [[id number]]    Return if symptoms worsen or fail to improve.  Park Meo, FNP

## 2022-06-20 NOTE — Assessment & Plan Note (Signed)
Unable to dive into this problem, it was briefly mentioned at the end of this time limited visit for another problem. He would like to have labs drawn today. I encouraged him to make another appt with PCP for his ongoing fatigue.

## 2022-06-21 LAB — COMPLETE METABOLIC PANEL WITH GFR
AG Ratio: 2.3 (calc) (ref 1.0–2.5)
ALT: 18 U/L (ref 9–46)
AST: 17 U/L (ref 10–35)
Albumin: 4.3 g/dL (ref 3.6–5.1)
Alkaline phosphatase (APISO): 85 U/L (ref 35–144)
BUN: 21 mg/dL (ref 7–25)
CO2: 28 mmol/L (ref 20–32)
Calcium: 9 mg/dL (ref 8.6–10.3)
Chloride: 107 mmol/L (ref 98–110)
Creat: 1.22 mg/dL (ref 0.70–1.28)
Globulin: 1.9 g/dL (calc) (ref 1.9–3.7)
Glucose, Bld: 111 mg/dL — ABNORMAL HIGH (ref 65–99)
Potassium: 4.9 mmol/L (ref 3.5–5.3)
Sodium: 140 mmol/L (ref 135–146)
Total Bilirubin: 0.8 mg/dL (ref 0.2–1.2)
Total Protein: 6.2 g/dL (ref 6.1–8.1)
eGFR: 63 mL/min/{1.73_m2} (ref >=60)

## 2022-06-21 LAB — CBC WITH DIFFERENTIAL/PLATELET
Absolute Monocytes: 914 cells/uL (ref 200–950)
Basophils Absolute: 78 cells/uL (ref 0–200)
Basophils Relative: 0.9 %
Eosinophils Absolute: 740 cells/uL — ABNORMAL HIGH (ref 15–500)
Eosinophils Relative: 8.5 %
HCT: 42.3 % (ref 38.5–50.0)
Hemoglobin: 13.8 g/dL (ref 13.2–17.1)
Lymphs Abs: 2758 cells/uL (ref 850–3900)
MCH: 28.2 pg (ref 27.0–33.0)
MCHC: 32.6 g/dL (ref 32.0–36.0)
MCV: 86.5 fL (ref 80.0–100.0)
MPV: 10.9 fL (ref 7.5–12.5)
Monocytes Relative: 10.5 %
Neutro Abs: 4211 cells/uL (ref 1500–7800)
Neutrophils Relative %: 48.4 %
Platelets: 287 10*3/uL (ref 140–400)
RBC: 4.89 10*6/uL (ref 4.20–5.80)
RDW: 13.2 % (ref 11.0–15.0)
Total Lymphocyte: 31.7 %
WBC: 8.7 10*3/uL (ref 3.8–10.8)

## 2022-07-01 ENCOUNTER — Other Ambulatory Visit: Payer: Self-pay | Admitting: Family Medicine

## 2022-07-01 MED ORDER — AZITHROMYCIN 250 MG PO TABS: ORAL_TABLET | ORAL | 0 refills | Status: DC

## 2022-07-05 ENCOUNTER — Ambulatory Visit (INDEPENDENT_AMBULATORY_CARE_PROVIDER_SITE_OTHER): Payer: PPO | Admitting: Family Medicine

## 2022-07-05 ENCOUNTER — Encounter: Payer: Self-pay | Admitting: Family Medicine

## 2022-07-05 VITALS — BP 124/76 | HR 68 | Temp 98.0°F | Ht 73.0 in | Wt 214.0 lb

## 2022-07-05 DIAGNOSIS — J329 Chronic sinusitis, unspecified: Secondary | ICD-10-CM

## 2022-07-05 DIAGNOSIS — D3610 Benign neoplasm of peripheral nerves and autonomic nervous system, unspecified: Secondary | ICD-10-CM | POA: Insufficient documentation

## 2022-07-05 MED ORDER — HYDROCODONE BIT-HOMATROP MBR 5-1.5 MG/5ML PO SOLN: 5.0000 mL | Freq: Four times a day (QID) | ORAL | 0 refills | Status: DC | PRN

## 2022-07-05 MED ORDER — PREDNISONE 20 MG PO TABS: ORAL_TABLET | ORAL | 0 refills | Status: DC

## 2022-07-05 MED ORDER — AMOXICILLIN-POT CLAVULANATE 875-125 MG PO TABS: 1.0000 | ORAL_TABLET | Freq: Two times a day (BID) | ORAL | 0 refills | Status: DC

## 2022-07-05 NOTE — Progress Notes (Signed)
Subjective:    Patient ID: [name], male    DOB: 05/28/1950, 72 y.o.   MRN: [id number] Patient has been coughing for the month.  He states that he had sinus pressure sinus pain and postnasal drip and drainage for more than a week.  He denies any fevers or chills however he coughed so hard he is strained the muscles around his operative site where he had his prostate removed.  He does have some mild pleurisy due to this and he is having some faint expiratory wheezing. Past Medical History:  Diagnosis Date   Adenomatous colon polyp 2004   Allergy    Arthritis    Diverticulosis 2011   Former smoker    GERD (gastroesophageal reflux disease)    HLD (hyperlipidemia)    Hyperplastic colon polyp 2004   Hypertension    Melanoma in situ [facility])    Past Surgical History:  Procedure Laterality Date   COLONOSCOPY  01/21/2009   SEVERAL    CORONARY STENT INTERVENTION N/A 10/04/2020   Procedure: CORONARY STENT INTERVENTION;  Surgeon: Marykay Lex, MD;  Location: [facility] INVASIVE CV LAB;  Service: Cardiovascular;  Laterality: N/A;   LEFT HEART CATH AND CORONARY ANGIOGRAPHY N/A 10/04/2020   Procedure: LEFT HEART CATH AND CORONARY ANGIOGRAPHY;  Surgeon: Marykay Lex, MD;  Location: [facility] INVASIVE CV LAB;  Service: Cardiovascular;  Laterality: N/A;   LEFT HEART CATH AND CORONARY ANGIOGRAPHY Left 11/30/2020   Procedure: LEFT HEART CATH AND CORONARY ANGIOGRAPHY;  Surgeon: Marykay Lex, MD;  Location: [facility] INVASIVE CV LAB;  Service: Cardiovascular;  Laterality: Left;   LYMPH NODE DISSECTION Bilateral 12/28/2021   Procedure: PELVIC LYMPH NODE DISSECTION;  Surgeon: [name], MD;  Location: [facility];  Service: Urology;  Laterality: Bilateral;   ROBOT ASSISTED LAPAROSCOPIC RADICAL PROSTATECTOMY N/A 12/28/2021   Procedure: XI ROBOTIC ASSISTED LAPAROSCOPIC RADICAL PROSTATECTOMY AND INDOCYANINE GREEN DYE INJECTION;  Surgeon: [name], MD;  Location: [facility];  Service: Urology;  Laterality: N/A;  3  HRS   TOOTH EXTRACTION     TOTAL KNEE ARTHROPLASTY Right 04/26/2020   Current Outpatient Medications on File Prior to Visit  Medication Sig Dispense Refill   azithromycin (ZITHROMAX) 250 MG tablet 2 tabs poqday1, 1 tab poqday 2-5 (Patient not taking: Reported on 07/05/2022) 6 tablet 0   cetirizine (ZYRTEC) 10 MG tablet TAKE 1 TABLET BY MOUTH EVERY DAY 90 tablet 0   ezetimibe (ZETIA) 10 MG tablet TAKE 1 TABLET BY MOUTH EVERY DAY 90 tablet 3   fluticasone (FLONASE) 50 MCG/ACT nasal spray Place 2 sprays into both nostrils daily. 16 g 6   isosorbide mononitrate (IMDUR) 30 MG 24 hr tablet Take 0.5 tablets (15 mg total) by mouth daily. 45 tablet 2   losartan (COZAAR) 50 MG tablet Take 0.5 tablets (25 mg total) by mouth in the morning. 45 tablet 1   metoprolol succinate (TOPROL-XL) 25 MG 24 hr tablet TAKE 1 TABLET (25 MG TOTAL) BY MOUTH DAILY. 90 tablet 3   nitroGLYCERIN (NITROSTAT) 0.4 MG SL tablet PLACE 1 TABLET UNDER THE TONGUE EVERY 5 MINUTES AS NEEDED FOR CHEST PAIN. 50 tablet 3   rosuvastatin (CRESTOR) 40 MG tablet Take 1 tablet (40 mg total) by mouth daily. 90 tablet 0   No current facility-administered medications on file prior to visit.   Allergies  Allergen Reactions   Levaquin [Levofloxacin In D5w] Shortness Of Breath   Social History   Socioeconomic History   Marital status: Married  Spouse name: Dwana Curd   Number of children: 2   Years of education: Not on file   Highest education level: 12th grade  Occupational History   Occupation: part Investment banker, corporate: [employer]  Tobacco Use   Smoking status: Former    Packs/day: 1.00    Years: 30.00    Additional pack years: 0.00    Total pack years: 30.00    Types: Cigarettes, Cigars    Quit date: 05/13/1998    Years since quitting: 24.1   Smokeless tobacco: Current    Types: Chew  Vaping Use   Vaping Use: Never used  Substance and Sexual Activity   Alcohol use: Yes    Alcohol/week: 3.0 standard drinks of alcohol    Types: 3  Cans of beer per week   Drug use: No   Sexual activity: Not Currently  Other Topics Concern   Not on file  Social History Narrative   Married x 51 years   2 grandsons   Social Determinants of Health   Financial Resource Strain: Low Risk  (06/18/2022)   Overall Financial Resource Strain (CARDIA)    Difficulty of Paying Living Expenses: Not very hard  Food Insecurity: No Food Insecurity (06/18/2022)   Hunger Vital Sign    Worried About Running Out of Food in the Last Year: Never true    Ran Out of Food in the Last Year: Never true  Transportation Needs: No Transportation Needs (06/18/2022)   PRAPARE - Administrator, Civil Service (Medical): No    Lack of Transportation (Non-Medical): No  Physical Activity: Unknown (06/18/2022)   Exercise Vital Sign    Days of Exercise per Week: 0 days    Minutes of Exercise per Session: Not on file  Stress: No Stress Concern Present (06/18/2022)   [employer] of Occupational Health - Occupational Stress Questionnaire    Feeling of Stress : Only a little  Social Connections: Moderately Isolated (06/18/2022)   Social Connection and Isolation Panel [NHANES]    Frequency of Communication with Friends and Family: More than three times a week    Frequency of Social Gatherings with Friends and Family: Once a week    Attends Religious Services: More than 4 times per year    Active Member of [employer] or Organizations: No    Attends Banker Meetings: Never    Marital Status: Widowed  Intimate Partner Violence: Not At Risk (12/28/2021)   Humiliation, Afraid, Rape, and Kick questionnaire    Fear of Current or Ex-Partner: No    Emotionally Abused: No    Physically Abused: No    Sexually Abused: No      Review of Systems  All other systems reviewed and are negative.      Objective:   Physical Exam Vitals reviewed.  Constitutional:      General: He is not in acute distress.    Appearance: He is well-developed. He is not  diaphoretic.  HENT:     Right Ear: Tympanic membrane and ear canal normal.     Left Ear: Tympanic membrane and ear canal normal.     Nose: Congestion and rhinorrhea present. No mucosal edema.     Right Sinus: Frontal sinus tenderness present. No maxillary sinus tenderness.     Left Sinus: Frontal sinus tenderness present. No maxillary sinus tenderness.     Mouth/Throat:     Pharynx: No oropharyngeal exudate.  Eyes:     Conjunctiva/sclera: Conjunctivae normal.  Pupils: Pupils are equal, round, and reactive to light.  Cardiovascular:     Rate and Rhythm: Normal rate and regular rhythm.     Heart sounds: Normal heart sounds.  Pulmonary:     Effort: Pulmonary effort is normal. No respiratory distress.     Breath sounds: Wheezing present. No rales.  Musculoskeletal:     Cervical back: Neck supple.  Lymphadenopathy:     Cervical: No cervical adenopathy.  Skin:    Findings: No erythema or rash.           Assessment & Plan:  Rhinosinusitis I believe the patient has a sinus infection.  Treat sinus infection with Augmentin 875 mg twice daily for 10 days with prednisone taper pack.  Use Hycodan 1 teaspoon every 6 hours as needed for cough.  Recheck in 1 week if no better or sooner if worse I believe his fatigue is likely multifactorial.  I believe he likely has hypogonadism.  I will repeat a testosterone level on the consistently low we will start the patient on testosterone replacement.  We discussed the risk of blood clots and prostate cancer but he is willing to proceed.  I believe that subclinical hypothyroidism is likely contributing but I believe it is likely a small percentage.  I will check a free T3 and a free T4 and repeat a TSH.  As long as the T3 and T4 are normal I would not recommend thyroid replacement but I would monitor this every 6 months.  I believe he likely also has fatigue related to his beta-blocker.  I believe this could even be explaining some of his dizziness which  is likely orthostatic dizziness due to relative drops in his blood pressure and heart rate.  I encouraged the patient to try to drink more water to avoid this.  Await the results of his lab work

## 2022-07-08 ENCOUNTER — Telehealth: Payer: Self-pay

## 2022-07-08 NOTE — Telephone Encounter (Signed)
Pt called asking if he should have his testosterone level checked? Pt states he didn't have any blood drawn Friday but did see where a note was made regarding in his chart. Thanks.

## 2022-07-10 ENCOUNTER — Other Ambulatory Visit: Payer: PPO

## 2022-07-10 DIAGNOSIS — R400 Somnolence: Secondary | ICD-10-CM

## 2022-07-10 DIAGNOSIS — R5383 Other fatigue: Secondary | ICD-10-CM | POA: Diagnosis not present

## 2022-07-11 LAB — TESTOSTERONE: Testosterone: 309 ng/dL (ref 250–827)

## 2022-07-15 ENCOUNTER — Telehealth: Payer: Self-pay

## 2022-07-15 NOTE — Telephone Encounter (Signed)
Pt called and asks if you think his testosterone level could be causing his fatigue. Pt advised level is within normal range and pt verbalized understanding. Thanks.

## 2022-08-02 ENCOUNTER — Ambulatory Visit: Payer: PPO | Admitting: Thoracic Surgery (Cardiothoracic Vascular Surgery)

## 2022-08-02 VITALS — BP 153/83 | HR 72 | Resp 20 | Ht 73.0 in | Wt 210.0 lb

## 2022-08-02 DIAGNOSIS — D3610 Benign neoplasm of peripheral nerves and autonomic nervous system, unspecified: Secondary | ICD-10-CM

## 2022-08-02 NOTE — Progress Notes (Signed)
[address]       [place] [address]             [phone]                    [name] [facility] Medical Record #[id number] Date of Birth: March 01, 1950  Referring: [name], MD Primary Care: [name], MD Primary Cardiologist: [name], MD  Chief Complaint:    Chief Complaint  Patient presents with   Follow-up    Further discuss scheduled surgery    History of Present Illness:    [name] 72 y.o. male presents for surgical evaluation of a spinal mass.  This was found incidentally.  He is asymptomatic.     Past Medical History:  Diagnosis Date   Adenomatous colon polyp 2004   Allergy    Arthritis    Diverticulosis 2011   Former smoker    GERD (gastroesophageal reflux disease)    HLD (hyperlipidemia)    Hyperplastic colon polyp 2004   Hypertension    Melanoma in situ [facility])     Past Surgical History:  Procedure Laterality Date   COLONOSCOPY  01/21/2009   SEVERAL    CORONARY STENT INTERVENTION N/A 10/04/2020   Procedure: CORONARY STENT INTERVENTION;  Surgeon: Marykay Lex, MD;  Location: [facility] INVASIVE CV LAB;  Service: Cardiovascular;  Laterality: N/A;   LEFT HEART CATH AND CORONARY ANGIOGRAPHY N/A 10/04/2020   Procedure: LEFT HEART CATH AND CORONARY ANGIOGRAPHY;  Surgeon: Marykay Lex, MD;  Location: [facility] INVASIVE CV LAB;  Service: Cardiovascular;  Laterality: N/A;   LEFT HEART CATH AND CORONARY ANGIOGRAPHY Left 11/30/2020   Procedure: LEFT HEART CATH AND CORONARY ANGIOGRAPHY;  Surgeon: Marykay Lex, MD;  Location: [facility] INVASIVE CV LAB;  Service: Cardiovascular;  Laterality: Left;   LYMPH NODE DISSECTION Bilateral 12/28/2021   Procedure: PELVIC LYMPH NODE DISSECTION;  Surgeon: [name], MD;  Location: [facility] ORS;  Service: Urology;  Laterality: Bilateral;   ROBOT ASSISTED LAPAROSCOPIC RADICAL PROSTATECTOMY N/A 12/28/2021   Procedure: XI ROBOTIC ASSISTED LAPAROSCOPIC RADICAL PROSTATECTOMY AND INDOCYANINE  GREEN DYE INJECTION;  Surgeon: [name], MD;  Location: [facility] ORS;  Service: Urology;  Laterality: N/A;  3 HRS   TOOTH EXTRACTION     TOTAL KNEE ARTHROPLASTY Right 04/26/2020    Family History  Problem Relation Age of Onset   Heart disease Mother        died in her 46's   Lung cancer Father    Diabetes Daughter      Social History   Tobacco Use  Smoking Status Former   Current packs/day: 0.00   Average packs/day: 1 pack/day for 30.0 years (30.0 ttl pk-yrs)   Types: Cigarettes, Cigars   Start date: 05/12/1968   Quit date: 05/13/1998   Years since quitting: 24.2  Smokeless Tobacco Current   Types: Chew    Social History   Substance and Sexual Activity  Alcohol Use Yes   Alcohol/week: 3.0 standard drinks of alcohol   Types: 3 Cans of beer per week     Allergies  Allergen Reactions   Levaquin [Levofloxacin In D5w] Shortness Of Breath    Current Outpatient Medications  Medication Sig Dispense Refill   cetirizine (ZYRTEC) 10 MG tablet TAKE 1 TABLET BY MOUTH EVERY DAY 90 tablet 0   ezetimibe (ZETIA) 10 MG tablet TAKE 1 TABLET BY MOUTH EVERY DAY 90 tablet 3   fluticasone (FLONASE) 50 MCG/ACT  nasal spray Place 2 sprays into both nostrils daily. 16 g 6   isosorbide mononitrate (IMDUR) 30 MG 24 hr tablet Take 0.5 tablets (15 mg total) by mouth daily. 45 tablet 2   losartan (COZAAR) 50 MG tablet Take 0.5 tablets (25 mg total) by mouth in the morning. 45 tablet 1   metoprolol succinate (TOPROL-XL) 25 MG 24 hr tablet TAKE 1 TABLET (25 MG TOTAL) BY MOUTH DAILY. 90 tablet 3   nitroGLYCERIN (NITROSTAT) 0.4 MG SL tablet PLACE 1 TABLET UNDER THE TONGUE EVERY 5 MINUTES AS NEEDED FOR CHEST PAIN. 50 tablet 3   rosuvastatin (CRESTOR) 40 MG tablet Take 1 tablet (40 mg total) by mouth daily. 90 tablet 0   No current facility-administered medications for this visit.    Review of Systems  Constitutional:  Negative for fever and malaise/fatigue.  Musculoskeletal:  Negative for  myalgias.  Neurological: Negative.     PHYSICAL EXAMINATION: BP (!) 153/83 (BP Location: Left Arm, Patient Position: Sitting)   Pulse 72   Resp 20   Ht 6\' 1"  (1.854 m)   Wt 210 lb (95.3 kg)   SpO2 96% Comment: RA  BMI 27.71 kg/m   Physical Exam Constitutional:      General: He is not in acute distress.    Appearance: He is not ill-appearing.  Cardiovascular:     Rate and Rhythm: Normal rate.     Heart sounds: No murmur heard. Pulmonary:     Effort: Pulmonary effort is normal.  Abdominal:     General: There is no distension.  Musculoskeletal:        General: Normal range of motion.     Cervical back: Normal range of motion.  Skin:    General: Skin is warm and dry.  Neurological:     General: No focal deficit present.     Mental Status: He is alert and oriented to person, place, and time.      Diagnostic Studies & Laboratory data:     Recent Radiology Findings:   No results found.     I have independently reviewed the above radiology studies  and reviewed the findings with the patient.   Recent Lab Findings: Lab Results  Component Value Date   WBC 8.7 06/20/2022   HGB 13.8 06/20/2022   HCT 42.3 06/20/2022   PLT 287 06/20/2022   GLUCOSE 111 (H) 06/20/2022   CHOL 93 06/06/2021   TRIG 70 06/06/2021   HDL 36 (L) 06/06/2021   LDLCALC 43 06/06/2021   ALT 18 06/20/2022   AST 17 06/20/2022   NA 140 06/20/2022   K 4.9 06/20/2022   CL 107 06/20/2022   CREATININE 1.22 06/20/2022   BUN 21 06/20/2022   CO2 28 06/20/2022   TSH 4.77 (H) 08/06/2021   INR 1.1 03/27/2020      IMPRESSION: 1. No evidence of pulmonary embolism. 2. Smoothly marginated 4.6 x 3.3 x 3.2 cm mass in the superomedial aspect of the right hemithorax which appears to communicate with the neural canal via the right T2 neural foramen, suspicious for a nerve sheath tumor. Further evaluation with nonemergent thoracic spine MRI with and without IV gadolinium is recommended in the near future  to better evaluate this finding. 3. Aortic atherosclerosis, in addition to left main and 2 vessel coronary artery disease. Assessment for potential risk factor modification, dietary therapy or pharmacologic therapy may be warranted, if clinically indicated.      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 Will coordinate combo procedure with Dr. Franky Macho. He will require a Right RATS, and resection after a posterior approach by Dr. Franky Macho.  The risks and benefits have been discussed, and he is agreeable to proceed.      Corliss Skains 08/02/2022 10:26 AM

## 2022-08-02 NOTE — H&P (View-Only) (Signed)
[address]       [place] [address]             [phone]                    [name] [facility] Medical Record #[id number] Date of Birth: March 01, 1950  Referring: [name], MD Primary Care: [name], MD Primary Cardiologist: [name], MD  Chief Complaint:    Chief Complaint  Patient presents with   Follow-up    Further discuss scheduled surgery    History of Present Illness:    [name] 72 y.o. male presents for surgical evaluation of a spinal mass.  This was found incidentally.  He is asymptomatic.     Past Medical History:  Diagnosis Date   Adenomatous colon polyp 2004   Allergy    Arthritis    Diverticulosis 2011   Former smoker    GERD (gastroesophageal reflux disease)    HLD (hyperlipidemia)    Hyperplastic colon polyp 2004   Hypertension    Melanoma in situ [facility])     Past Surgical History:  Procedure Laterality Date   COLONOSCOPY  01/21/2009   SEVERAL    CORONARY STENT INTERVENTION N/A 10/04/2020   Procedure: CORONARY STENT INTERVENTION;  Surgeon: Marykay Lex, MD;  Location: [facility] INVASIVE CV LAB;  Service: Cardiovascular;  Laterality: N/A;   LEFT HEART CATH AND CORONARY ANGIOGRAPHY N/A 10/04/2020   Procedure: LEFT HEART CATH AND CORONARY ANGIOGRAPHY;  Surgeon: Marykay Lex, MD;  Location: [facility] INVASIVE CV LAB;  Service: Cardiovascular;  Laterality: N/A;   LEFT HEART CATH AND CORONARY ANGIOGRAPHY Left 11/30/2020   Procedure: LEFT HEART CATH AND CORONARY ANGIOGRAPHY;  Surgeon: Marykay Lex, MD;  Location: [facility] INVASIVE CV LAB;  Service: Cardiovascular;  Laterality: Left;   LYMPH NODE DISSECTION Bilateral 12/28/2021   Procedure: PELVIC LYMPH NODE DISSECTION;  Surgeon: [name], MD;  Location: [facility];  Service: Urology;  Laterality: Bilateral;   ROBOT ASSISTED LAPAROSCOPIC RADICAL PROSTATECTOMY N/A 12/28/2021   Procedure: XI ROBOTIC ASSISTED LAPAROSCOPIC RADICAL PROSTATECTOMY AND INDOCYANINE  GREEN DYE INJECTION;  Surgeon: [name], MD;  Location: [facility];  Service: Urology;  Laterality: N/A;  3 HRS   TOOTH EXTRACTION     TOTAL KNEE ARTHROPLASTY Right 04/26/2020    Family History  Problem Relation Age of Onset   Heart disease Mother        died in her 46's   Lung cancer Father    Diabetes Daughter      Social History   Tobacco Use  Smoking Status Former   Current packs/day: 0.00   Average packs/day: 1 pack/day for 30.0 years (30.0 ttl pk-yrs)   Types: Cigarettes, Cigars   Start date: 05/12/1968   Quit date: 05/13/1998   Years since quitting: 24.2  Smokeless Tobacco Current   Types: Chew    Social History   Substance and Sexual Activity  Alcohol Use Yes   Alcohol/week: 3.0 standard drinks of alcohol   Types: 3 Cans of beer per week     Allergies  Allergen Reactions   Levaquin [Levofloxacin In D5w] Shortness Of Breath    Current Outpatient Medications  Medication Sig Dispense Refill   cetirizine (ZYRTEC) 10 MG tablet TAKE 1 TABLET BY MOUTH EVERY DAY 90 tablet 0   ezetimibe (ZETIA) 10 MG tablet TAKE 1 TABLET BY MOUTH EVERY DAY 90 tablet 3   fluticasone (FLONASE) 50 MCG/ACT  nasal spray Place 2 sprays into both nostrils daily. 16 g 6   isosorbide mononitrate (IMDUR) 30 MG 24 hr tablet Take 0.5 tablets (15 mg total) by mouth daily. 45 tablet 2   losartan (COZAAR) 50 MG tablet Take 0.5 tablets (25 mg total) by mouth in the morning. 45 tablet 1   metoprolol succinate (TOPROL-XL) 25 MG 24 hr tablet TAKE 1 TABLET (25 MG TOTAL) BY MOUTH DAILY. 90 tablet 3   nitroGLYCERIN (NITROSTAT) 0.4 MG SL tablet PLACE 1 TABLET UNDER THE TONGUE EVERY 5 MINUTES AS NEEDED FOR CHEST PAIN. 50 tablet 3   rosuvastatin (CRESTOR) 40 MG tablet Take 1 tablet (40 mg total) by mouth daily. 90 tablet 0   No current facility-administered medications for this visit.    Review of Systems  Constitutional:  Negative for fever and malaise/fatigue.  Musculoskeletal:  Negative for  myalgias.  Neurological: Negative.     PHYSICAL EXAMINATION: BP (!) 153/83 (BP Location: Left Arm, Patient Position: Sitting)   Pulse 72   Resp 20   Ht 6\' 1"  (1.854 m)   Wt 210 lb (95.3 kg)   SpO2 96% Comment: RA  BMI 27.71 kg/m   Physical Exam Constitutional:      General: He is not in acute distress.    Appearance: He is not ill-appearing.  Cardiovascular:     Rate and Rhythm: Normal rate.     Heart sounds: No murmur heard. Pulmonary:     Effort: Pulmonary effort is normal.  Abdominal:     General: There is no distension.  Musculoskeletal:        General: Normal range of motion.     Cervical back: Normal range of motion.  Skin:    General: Skin is warm and dry.  Neurological:     General: No focal deficit present.     Mental Status: He is alert and oriented to person, place, and time.      Diagnostic Studies & Laboratory data:     Recent Radiology Findings:   No results found.     I have independently reviewed the above radiology studies  and reviewed the findings with the patient.   Recent Lab Findings: Lab Results  Component Value Date   WBC 8.7 06/20/2022   HGB 13.8 06/20/2022   HCT 42.3 06/20/2022   PLT 287 06/20/2022   GLUCOSE 111 (H) 06/20/2022   CHOL 93 06/06/2021   TRIG 70 06/06/2021   HDL 36 (L) 06/06/2021   LDLCALC 43 06/06/2021   ALT 18 06/20/2022   AST 17 06/20/2022   NA 140 06/20/2022   K 4.9 06/20/2022   CL 107 06/20/2022   CREATININE 1.22 06/20/2022   BUN 21 06/20/2022   CO2 28 06/20/2022   TSH 4.77 (H) 08/06/2021   INR 1.1 03/27/2020      IMPRESSION: 1. No evidence of pulmonary embolism. 2. Smoothly marginated 4.6 x 3.3 x 3.2 cm mass in the superomedial aspect of the right hemithorax which appears to communicate with the neural canal via the right T2 neural foramen, suspicious for a nerve sheath tumor. Further evaluation with nonemergent thoracic spine MRI with and without IV gadolinium is recommended in the near future  to better evaluate this finding. 3. Aortic atherosclerosis, in addition to left main and 2 vessel coronary artery disease. Assessment for potential risk factor modification, dietary therapy or pharmacologic therapy may be warranted, if clinically indicated.      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 Will coordinate combo procedure with Dr. Franky Macho. He will require a Right RATS, and resection after a posterior approach by Dr. Franky Macho.  The risks and benefits have been discussed, and he is agreeable to proceed.      Corliss Skains 08/02/2022 10:26 AM

## 2022-08-06 ENCOUNTER — Encounter: Payer: Self-pay | Admitting: Pharmacist

## 2022-08-06 NOTE — Progress Notes (Signed)
Patient previously followed by [facility] pharmacist. Per clinical review, no pharmacist appointment needed at this time. Care guide directed to contact patient and cancel appointment and notify pharmacy team of any patient concerns.  

## 2022-08-07 ENCOUNTER — Encounter: Payer: PPO | Admitting: Pharmacist

## 2022-08-09 NOTE — Pre-Procedure Instructions (Signed)
Surgical Instructions    Your procedure is scheduled on August 14, 2022.  Report to [facility] Main Entrance "A" at 5:30 A.M., then check in with the Admitting office.  Call this number if you have problems the morning of surgery:  [phone]   If you have any questions prior to your surgery date call [phone]: Open Monday-Friday 8am-4pm If you experience any cold or flu symptoms such as cough, fever, chills, shortness of breath, etc. between now and your scheduled surgery, please notify us at the above number     Remember:  Do not eat or drink after midnight the night before your surgery    Take these medicines the morning of surgery with A SIP OF WATER:  cetirizine (ZYRTEC)  ezetimibe (ZETIA)  isosorbide mononitrate (IMDUR)  metoprolol succinate (TOPROL-XL)  rosuvastatin (CRESTOR)   If needed:  Nitroglycerin  As of today, STOP taking any Aspirin (unless otherwise instructed by your surgeon) Aleve, Naproxen, Ibuprofen, Motrin, Advil, Goody's, BC's, all herbal medications, fish oil, and all vitamins.            [facility] is not responsible for any belongings or valuables.    Do NOT Smoke (Tobacco/Vaping)  24 hours prior to your procedure  If you use a CPAP at night, you may bring your mask for your overnight stay.   Contacts, glasses, hearing aids, dentures or partials may not be worn into surgery, please bring cases for these belongings   For patients admitted to the hospital, discharge time will be determined by your treatment team.   Patients discharged the day of surgery will not be allowed to drive home, and someone needs to stay with them for 24 hours.   SURGICAL WAITING ROOM VISITATION Patients having surgery or a procedure may have no more than 2 support people in the waiting area - these visitors may rotate.   Children under the age of 66 must have an adult with them who is not the patient. If the patient needs to stay at the hospital during part of their  recovery, the visitor guidelines for inpatient rooms apply. Pre-op nurse will coordinate an appropriate time for 1 support person to accompany patient in pre-op.  This support person may not rotate.   Please refer to [email or web address] for the visitor guidelines for Inpatients (after your surgery is over and you are in a regular room).    Special instructions:    Oral Hygiene is also important to reduce your risk of infection.  Remember - BRUSH YOUR TEETH THE MORNING OF SURGERY WITH YOUR REGULAR TOOTHPASTE   Mower- Preparing For Surgery  Before surgery, you can play an important role. Because skin is not sterile, your skin needs to be as free of germs as possible. You can reduce the number of germs on your skin by washing with CHG (chlorahexidine gluconate) Soap before surgery.  CHG is an antiseptic cleaner which kills germs and bonds with the skin to continue killing germs even after washing.     Please do not use if you have an allergy to CHG or antibacterial soaps. If your skin becomes reddened/irritated stop using the CHG.  Do not shave (including legs and underarms) for at least 48 hours prior to first CHG shower. It is OK to shave your face.  Please follow these instructions carefully.     Shower the NIGHT BEFORE SURGERY and the MORNING OF SURGERY with CHG Soap.   If you chose to wash your hair, wash your  hair first as usual with your normal shampoo. After you shampoo, rinse your hair and body thoroughly to remove the shampoo.  Then [employer] and genitals (private parts) with your normal soap and rinse thoroughly to remove soap.  After that Use CHG Soap as you would any other liquid soap. You can apply CHG directly to the skin and wash gently with a scrungie or a clean washcloth.   Apply the CHG Soap to your body ONLY FROM THE NECK DOWN.  Do not use on open wounds or open sores. Avoid contact with your eyes, ears, mouth and  genitals (private parts). Wash Face and genitals (private parts)  with your normal soap.   Wash thoroughly, paying special attention to the area where your surgery will be performed.  Thoroughly rinse your body with warm water from the neck down.  DO NOT shower/wash with your normal soap after using and rinsing off the CHG Soap.  Pat yourself dry with a CLEAN TOWEL.  Wear CLEAN PAJAMAS to bed the night before surgery  Place CLEAN SHEETS on your bed the night before your surgery  DO NOT SLEEP WITH PETS.   Day of Surgery:  Take a shower with CHG soap. Wear Clean/Comfortable clothing the morning of surgery Do not wear jewelry or makeup. Do not wear lotions, powders, perfumes/cologne or deodorant. Do not shave 48 hours prior to surgery.  Men may shave face and neck. Do not bring valuables to the hospital. Do not wear nail polish, gel polish, artificial nails, or any other type of covering on natural nails (fingers and toes) If you have artificial nails or gel coating that need to be removed by a nail salon, please have this removed prior to surgery. Artificial nails or gel coating may interfere with anesthesia's ability to adequately monitor your vital signs. Remember to brush your teeth WITH YOUR REGULAR TOOTHPASTE.    If you received a COVID test during your pre-op visit, it is requested that you wear a mask when out in public, stay away from anyone that may not be feeling well, and notify your surgeon if you develop symptoms. If you have been in contact with anyone that has tested positive in the last 10 days, please notify your surgeon.    Please read over the following fact sheets that you were given.

## 2022-08-12 ENCOUNTER — Ambulatory Visit (HOSPITAL_COMMUNITY)
Admission: RE | Admit: 2022-08-12 | Discharge: 2022-08-12 | Disposition: A | Discharge status: Home / Self Care | Payer: PPO | Source: Ambulatory Visit | Attending: Thoracic Surgery (Cardiothoracic Vascular Surgery) | Admitting: Thoracic Surgery (Cardiothoracic Vascular Surgery)

## 2022-08-12 ENCOUNTER — Other Ambulatory Visit: Payer: Self-pay

## 2022-08-12 ENCOUNTER — Encounter (HOSPITAL_COMMUNITY)
Admission: RE | Admit: 2022-08-12 | Discharge: 2022-08-12 | Disposition: A | Discharge status: Home / Self Care | Payer: PPO | Source: Ambulatory Visit | Attending: Thoracic Surgery (Cardiothoracic Vascular Surgery) | Admitting: Thoracic Surgery (Cardiothoracic Vascular Surgery)

## 2022-08-12 ENCOUNTER — Encounter (HOSPITAL_COMMUNITY): Payer: Self-pay

## 2022-08-12 VITALS — BP 145/76 | HR 70 | Temp 98.3°F | Resp 18 | Ht 73.0 in | Wt 221.0 lb

## 2022-08-12 DIAGNOSIS — Z01818 Encounter for other preprocedural examination: Secondary | ICD-10-CM | POA: Insufficient documentation

## 2022-08-12 DIAGNOSIS — J9383 Other pneumothorax: Secondary | ICD-10-CM | POA: Diagnosis not present

## 2022-08-12 DIAGNOSIS — I2510 Atherosclerotic heart disease of native coronary artery without angina pectoris: Secondary | ICD-10-CM | POA: Insufficient documentation

## 2022-08-12 DIAGNOSIS — I10 Essential (primary) hypertension: Secondary | ICD-10-CM | POA: Diagnosis not present

## 2022-08-12 DIAGNOSIS — J449 Chronic obstructive pulmonary disease, unspecified: Secondary | ICD-10-CM | POA: Diagnosis not present

## 2022-08-12 DIAGNOSIS — Z96651 Presence of right artificial knee joint: Secondary | ICD-10-CM | POA: Diagnosis not present

## 2022-08-12 DIAGNOSIS — I771 Stricture of artery: Secondary | ICD-10-CM | POA: Diagnosis not present

## 2022-08-12 DIAGNOSIS — Z955 Presence of coronary angioplasty implant and graft: Secondary | ICD-10-CM | POA: Insufficient documentation

## 2022-08-12 DIAGNOSIS — C61 Malignant neoplasm of prostate: Secondary | ICD-10-CM | POA: Diagnosis not present

## 2022-08-12 DIAGNOSIS — Z79899 Other long term (current) drug therapy: Secondary | ICD-10-CM | POA: Diagnosis not present

## 2022-08-12 DIAGNOSIS — I700 Atherosclerosis of aorta: Secondary | ICD-10-CM | POA: Insufficient documentation

## 2022-08-12 DIAGNOSIS — E785 Hyperlipidemia, unspecified: Secondary | ICD-10-CM | POA: Diagnosis not present

## 2022-08-12 DIAGNOSIS — Z1152 Encounter for screening for COVID-19: Secondary | ICD-10-CM | POA: Insufficient documentation

## 2022-08-12 DIAGNOSIS — D3610 Benign neoplasm of peripheral nerves and autonomic nervous system, unspecified: Secondary | ICD-10-CM | POA: Insufficient documentation

## 2022-08-12 DIAGNOSIS — J9859 Other diseases of mediastinum, not elsewhere classified: Secondary | ICD-10-CM | POA: Diagnosis not present

## 2022-08-12 DIAGNOSIS — D649 Anemia, unspecified: Secondary | ICD-10-CM | POA: Diagnosis not present

## 2022-08-12 HISTORY — DX: Angina pectoris, unspecified: I20.9

## 2022-08-12 HISTORY — DX: Dyspnea, unspecified: R06.00

## 2022-08-12 LAB — URINALYSIS, ROUTINE W REFLEX MICROSCOPIC
Bilirubin Urine: NEGATIVE
Glucose, UA: NEGATIVE mg/dL
Hgb urine dipstick: NEGATIVE
Ketones, ur: NEGATIVE mg/dL
Leukocytes,Ua: NEGATIVE
Nitrite: NEGATIVE
Protein, ur: NEGATIVE mg/dL
Specific Gravity, Urine: 1.006 (ref 1.005–1.030)
pH: 6 (ref 5.0–8.0)

## 2022-08-12 LAB — COMPREHENSIVE METABOLIC PANEL
ALT: 20 U/L (ref 0–44)
AST: 26 U/L (ref 15–41)
Albumin: 3.7 g/dL (ref 3.5–5.0)
Alkaline Phosphatase: 74 U/L (ref 38–126)
Anion gap: 8 (ref 5–15)
BUN: 17 mg/dL (ref 8–23)
CO2: 22 mmol/L (ref 22–32)
Calcium: 8.7 mg/dL — ABNORMAL LOW (ref 8.9–10.3)
Chloride: 107 mmol/L (ref 98–111)
Creatinine, Ser: 1.3 mg/dL — ABNORMAL HIGH (ref 0.61–1.24)
GFR, Estimated: 58 mL/min — ABNORMAL LOW (ref >60)
Glucose, Bld: 117 mg/dL — ABNORMAL HIGH (ref 70–99)
Potassium: 4 mmol/L (ref 3.5–5.1)
Sodium: 137 mmol/L (ref 135–145)
Total Bilirubin: 0.6 mg/dL (ref 0.3–1.2)
Total Protein: 6.2 g/dL — ABNORMAL LOW (ref 6.5–8.1)

## 2022-08-12 LAB — APTT: aPTT: 31 seconds (ref 24–36)

## 2022-08-12 LAB — TYPE AND SCREEN
ABO/RH(D): A POS
Antibody Screen: NEGATIVE

## 2022-08-12 LAB — CBC
HCT: 41.1 % (ref 39.0–52.0)
Hemoglobin: 13.4 g/dL (ref 13.0–17.0)
MCH: 27.9 pg (ref 26.0–34.0)
MCHC: 32.6 g/dL (ref 30.0–36.0)
MCV: 85.6 fL (ref 80.0–100.0)
Platelets: 294 10*3/uL (ref 150–400)
RBC: 4.8 MIL/uL (ref 4.22–5.81)
RDW: 13.4 % (ref 11.5–15.5)
WBC: 8.9 10*3/uL (ref 4.0–10.5)
nRBC: 0 % (ref 0.0–0.2)

## 2022-08-12 LAB — PROTIME-INR
INR: 1.1 (ref 0.8–1.2)
Prothrombin Time: 14.5 seconds (ref 11.4–15.2)

## 2022-08-12 LAB — SURGICAL PCR SCREEN
MRSA, PCR: NEGATIVE
Staphylococcus aureus: NEGATIVE

## 2022-08-12 NOTE — Progress Notes (Signed)
PCP - Dr. Lynnea Ferrier Cardiologist - Dr. [name] Pulmonologist:  Dr. Belia Heman  PPM/ICD - denies  Chest x-ray - PAT, 08/12/2022 EKG - PAT, 08/12/2022 Stress Test -  ECHO - 09/28/2020 Cardiac Cath - 11/30/2020  Sleep Study - denies CPAP - denies  Non-diabetic  Blood Thinner Instructions: denies Aspirin Instructions:denies  ERAS Protcol - No, NPO  COVID TEST- PAT, 08/12/2022  Anesthesia review: Yes. HTN, mediastinal mass  Patient denies shortness of breath, fever, cough and chest pain at PAT appointment   All instructions explained to the patient, with a verbal understanding of the material. Patient agrees to go over the instructions while at home for a better understanding. Patient also instructed to self quarantine after being tested for COVID-19. The opportunity to ask questions was provided.

## 2022-08-13 LAB — SARS CORONAVIRUS 2 (TAT 6-24 HRS): SARS Coronavirus 2: NEGATIVE

## 2022-08-13 NOTE — Progress Notes (Signed)
Anesthesia Chart Review:  Follows with cardiology for hx of CAD s/p DES to proximal LAD 09/2020.  Repeat LHC 11/2020 patent mid LAD stent, 75% proximal RCA, nondominant vessel. Echo with preserved EF 60 to 65% impaired relaxation.  Last seen by Dr. Azucena Cecil 01/07/22. Stable at that time. Recommended to continue current medical management and followup in one year.   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   Preop labs reviewed, creatinine mildly elevated at 1.30, otherwise unremarkable.   EKG 08/12/22: Normal sinus rhythm. Rate 67. Cannot rule out Anterior infarct , age undetermined  CTA Chest 10/08/21: IMPRESSION: 1. No evidence of pulmonary embolism. 2. Smoothly marginated 4.6 x 3.3 x 3.2 cm mass in the superomedial aspect of the right hemithorax which appears to communicate with the neural canal via the right T2 neural foramen, suspicious for a nerve sheath tumor. Further evaluation with nonemergent thoracic spine MRI with and without IV gadolinium is recommended in the near future to better evaluate this finding. 3. Aortic atherosclerosis, in addition to left main and 2 vessel coronary artery disease. Assessment for potential risk factor modification, dietary therapy or pharmacologic therapy may be warranted, if clinically indicated.   Cath 11/30/20:    Prox LAD to Mid LAD stent is widely patent   Dist LAD lesion is 45% stenosed.   Prox RCA lesion is 75% stenosed.  Small nondominant vessel   The left ventricular systolic function is normal.   LV end diastolic pressure is normal.   There is no aortic valve stenosis.   Widely patent LAD stent with moderate stenosis of small non-dominant RCA (not PCI target). Otherwise minimal CAD in distal LAD & normal [id number] Normal LV size and function.  Normal EDP.     Recommendation: Consider nonischemic etiology for chest pain.  TTE 09/28/20:  1. Left ventricular ejection  fraction, by estimation, is 60 to 65%. The  left ventricle has normal function. The left ventricle has no regional  wall motion abnormalities. The left ventricular internal cavity size was  mildly dilated. Left ventricular  diastolic parameters are consistent with Grade I diastolic dysfunction  (impaired relaxation).   2. Right ventricular systolic function is normal. The right ventricular  size is normal. There is normal pulmonary artery systolic pressure. The  estimated right ventricular systolic pressure is 28.8 mmHg.   3. The mitral valve is normal in structure. Mild mitral valve  regurgitation. No evidence of mitral stenosis.   4. The aortic valve is normal in structure. Aortic valve regurgitation is  mild. Mild to moderate aortic valve sclerosis/calcification is present,  without any evidence of aortic stenosis.   5. The inferior vena cava is normal in size with greater than 50%  respiratory variability, suggesting right atrial pressure of 3 mmHg.     Zannie Cove [facility] Short Stay Center/Anesthesiology Phone [phone] 08/13/2022 9:24 AM

## 2022-08-13 NOTE — Anesthesia Preprocedure Evaluation (Signed)
Anesthesia Evaluation  Patient identified by MRN, date of birth, ID band Patient awake    Reviewed: Allergy & Precautions, NPO status , Patient's Chart, lab work & pertinent test results  History of Anesthesia Complications Negative for: history of anesthetic complications  Airway Mallampati: III  TM Distance: >3 FB Neck ROM: Full    Dental  (+) Dental Advisory Given, Teeth Intact, Missing,    Pulmonary shortness of breath, COPD, former smoker   breath sounds clear to auscultation       Cardiovascular hypertension, Pt. on medications and Pt. on home beta blockers (-) angina + CAD and + Cardiac Stents   Rhythm:Regular     Neuro/Psych  Neuromuscular disease  negative psych ROS   GI/Hepatic Neg liver ROS,GERD  ,,  Endo/Other    Renal/GU negative Renal ROS     Musculoskeletal  (+) Arthritis ,    Abdominal   Peds  Hematology negative hematology ROS (+) Lab Results      Component                Value               Date                      WBC                      8.9                 08/12/2022                HGB                      13.4                08/12/2022                HCT                      41.1                08/12/2022                MCV                      85.6                08/12/2022                PLT                      294                 08/12/2022              Anesthesia Other Findings   Reproductive/Obstetrics                             Anesthesia Physical Anesthesia Plan  ASA: 2  Anesthesia Plan: General   Post-op Pain Management: Ofirmev IV (intra-op)* and Ketamine IV*   Induction: Intravenous  PONV Risk Score and Plan: 2 and Ondansetron and Dexamethasone  Airway Management Planned: Oral ETT and Double Lumen EBT  Additional Equipment: Arterial line, Ultrasound Guidance Line Placement and CVP  Intra-op Plan:   Post-operative Plan: Possible Post-op  intubation/ventilation and Extubation in OR  Informed Consent: I have reviewed the patients History and Physical, chart, labs and discussed the procedure including the risks, benefits and alternatives for the proposed anesthesia with the patient or authorized representative who has indicated his/her understanding and acceptance.     Dental advisory given  Plan Discussed with: CRNA  Anesthesia Plan Comments: (PAT note by Antionette Poles, PA-C: Follows with cardiology for hx of CAD s/p DES to proximal LAD 09/2020.  Repeat LHC 11/2020 patent mid LAD stent, 75% proximal RCA, nondominant vessel. Echo with preserved EF 60 to 65% impaired relaxation.  Last seen by Dr. Azucena Cecil 01/07/22. Stable at that time. Recommended to continue current medical management and followup in one year.   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   Preop labs reviewed, creatinine mildly elevated at 1.30, otherwise unremarkable.   EKG 08/12/22: Normal sinus rhythm. Rate 67. Cannot rule out Anterior infarct , age undetermined  CTA Chest 10/08/21: IMPRESSION: 1. No evidence of pulmonary embolism. 2. Smoothly marginated 4.6 x 3.3 x 3.2 cm mass in the superomedial aspect of the right hemithorax which appears to communicate with the neural canal via the right T2 neural foramen, suspicious for a nerve sheath tumor. Further evaluation with nonemergent thoracic spine MRI with and without IV gadolinium is recommended in the near future to better evaluate this finding. 3. Aortic atherosclerosis, in addition to left main and 2 vessel coronary artery disease. Assessment for potential risk factor modification, dietary therapy or pharmacologic therapy may be warranted, if clinically indicated.  Cath 11/30/20:    Prox LAD to Mid LAD stent is widely patent   Dist LAD lesion is 45% stenosed.   Prox RCA lesion is 75% stenosed.  Small nondominant vessel   The left  ventricular systolic function is normal.   LV end diastolic pressure is normal.   There is no aortic valve stenosis.  ? Widely patent LAD stent with moderate stenosis of small non-dominant RCA (not PCI target). ? Otherwise minimal CAD in distal LAD & normal LCx-OM-LPL-PDA ? Normal LV size and function.  Normal EDP.   Recommendation: Consider nonischemic etiology for chest pain.  TTE 09/28/20: 1. Left ventricular ejection fraction, by estimation, is 60 to 65%. The  left ventricle has normal function. The left ventricle has no regional  wall motion abnormalities. The left ventricular internal cavity size was  mildly dilated. Left ventricular  diastolic parameters are consistent with Grade I diastolic dysfunction  (impaired relaxation).  2. Right ventricular systolic function is normal. The right ventricular  size is normal. There is normal pulmonary artery systolic pressure. The  estimated right ventricular systolic pressure is 28.8 mmHg.  3. The mitral valve is normal in structure. Mild mitral valve  regurgitation. No evidence of mitral stenosis.  4. The aortic valve is normal in structure. Aortic valve regurgitation is  mild. Mild to moderate aortic valve sclerosis/calcification is present,  without any evidence of aortic stenosis.  5. The inferior vena cava is normal in size with greater than 50%  respiratory variability, suggesting right atrial pressure of 3 mmHg.    )        Anesthesia Quick Evaluation

## 2022-08-14 ENCOUNTER — Inpatient Hospital Stay (HOSPITAL_COMMUNITY): Payer: PPO

## 2022-08-14 ENCOUNTER — Encounter (HOSPITAL_COMMUNITY): Payer: Self-pay | Admitting: Thoracic Surgery (Cardiothoracic Vascular Surgery)

## 2022-08-14 ENCOUNTER — Other Ambulatory Visit: Payer: Self-pay

## 2022-08-14 ENCOUNTER — Inpatient Hospital Stay (HOSPITAL_COMMUNITY): Payer: PPO | Admitting: Certified Registered Nurse Anesthetist

## 2022-08-14 ENCOUNTER — Encounter (HOSPITAL_COMMUNITY)
Admission: RE | Disposition: A | Discharge status: Home / Self Care | Payer: Self-pay | Source: Ambulatory Visit | Attending: Thoracic Surgery (Cardiothoracic Vascular Surgery)

## 2022-08-14 ENCOUNTER — Inpatient Hospital Stay (HOSPITAL_COMMUNITY): Payer: PPO | Admitting: Physician Assistant

## 2022-08-14 ENCOUNTER — Inpatient Hospital Stay (HOSPITAL_COMMUNITY)
Admission: RE | Admit: 2022-08-14 | Discharge: 2022-08-16 | DRG: 518 | Disposition: A | Discharge status: Home / Self Care | Payer: PPO | Source: Ambulatory Visit | Attending: Thoracic Surgery (Cardiothoracic Vascular Surgery) | Admitting: Thoracic Surgery (Cardiothoracic Vascular Surgery)

## 2022-08-14 DIAGNOSIS — Z720 Tobacco use: Secondary | ICD-10-CM | POA: Diagnosis not present

## 2022-08-14 DIAGNOSIS — I2510 Atherosclerotic heart disease of native coronary artery without angina pectoris: Secondary | ICD-10-CM | POA: Diagnosis not present

## 2022-08-14 DIAGNOSIS — J9859 Other diseases of mediastinum, not elsewhere classified: Secondary | ICD-10-CM | POA: Diagnosis present

## 2022-08-14 DIAGNOSIS — Z96651 Presence of right artificial knee joint: Secondary | ICD-10-CM | POA: Diagnosis not present

## 2022-08-14 DIAGNOSIS — J449 Chronic obstructive pulmonary disease, unspecified: Secondary | ICD-10-CM | POA: Diagnosis present

## 2022-08-14 DIAGNOSIS — C61 Malignant neoplasm of prostate: Secondary | ICD-10-CM | POA: Diagnosis not present

## 2022-08-14 DIAGNOSIS — Z833 Family history of diabetes mellitus: Secondary | ICD-10-CM | POA: Diagnosis not present

## 2022-08-14 DIAGNOSIS — Z79899 Other long term (current) drug therapy: Secondary | ICD-10-CM

## 2022-08-14 DIAGNOSIS — Z85820 Personal history of malignant melanoma of skin: Secondary | ICD-10-CM

## 2022-08-14 DIAGNOSIS — D492 Neoplasm of unspecified behavior of bone, soft tissue, and skin: Secondary | ICD-10-CM | POA: Diagnosis not present

## 2022-08-14 DIAGNOSIS — Z955 Presence of coronary angioplasty implant and graft: Secondary | ICD-10-CM | POA: Diagnosis not present

## 2022-08-14 DIAGNOSIS — Z881 Allergy status to other antibiotic agents status: Secondary | ICD-10-CM

## 2022-08-14 DIAGNOSIS — I10 Essential (primary) hypertension: Secondary | ICD-10-CM | POA: Diagnosis present

## 2022-08-14 DIAGNOSIS — J9383 Other pneumothorax: Secondary | ICD-10-CM | POA: Diagnosis not present

## 2022-08-14 DIAGNOSIS — D3610 Benign neoplasm of peripheral nerves and autonomic nervous system, unspecified: Secondary | ICD-10-CM | POA: Diagnosis not present

## 2022-08-14 DIAGNOSIS — D649 Anemia, unspecified: Secondary | ICD-10-CM | POA: Diagnosis not present

## 2022-08-14 DIAGNOSIS — Z4682 Encounter for fitting and adjustment of non-vascular catheter: Secondary | ICD-10-CM | POA: Diagnosis not present

## 2022-08-14 DIAGNOSIS — Z1152 Encounter for screening for COVID-19: Secondary | ICD-10-CM | POA: Diagnosis not present

## 2022-08-14 DIAGNOSIS — R918 Other nonspecific abnormal finding of lung field: Secondary | ICD-10-CM | POA: Diagnosis not present

## 2022-08-14 DIAGNOSIS — K219 Gastro-esophageal reflux disease without esophagitis: Secondary | ICD-10-CM | POA: Diagnosis not present

## 2022-08-14 DIAGNOSIS — E785 Hyperlipidemia, unspecified: Secondary | ICD-10-CM | POA: Diagnosis not present

## 2022-08-14 DIAGNOSIS — R222 Localized swelling, mass and lump, trunk: Secondary | ICD-10-CM | POA: Diagnosis not present

## 2022-08-14 DIAGNOSIS — Z801 Family history of malignant neoplasm of trachea, bronchus and lung: Secondary | ICD-10-CM | POA: Diagnosis not present

## 2022-08-14 DIAGNOSIS — J984 Other disorders of lung: Secondary | ICD-10-CM | POA: Diagnosis not present

## 2022-08-14 DIAGNOSIS — Z8249 Family history of ischemic heart disease and other diseases of the circulatory system: Secondary | ICD-10-CM | POA: Diagnosis not present

## 2022-08-14 DIAGNOSIS — J939 Pneumothorax, unspecified: Secondary | ICD-10-CM | POA: Diagnosis not present

## 2022-08-14 HISTORY — PX: LAMINECTOMY: SHX219

## 2022-08-14 LAB — ABO/RH: ABO/RH(D): A POS

## 2022-08-14 SURGERY — EXCISION, MASS, MEDIASTINUM, ROBOT-ASSISTED
Anesthesia: General | Site: Chest | Laterality: Right

## 2022-08-14 MED ORDER — ROSUVASTATIN CALCIUM 20 MG PO TABS
40.0000 mg | ORAL_TABLET | Freq: Every day | ORAL | Status: DC
  Administered 2022-08-15 – 2022-08-16 (×2): 40 mg via ORAL
  Filled 2022-08-14 (×2): qty 2

## 2022-08-14 MED ORDER — LIDOCAINE-EPINEPHRINE 0.5 %-1:200000 IJ SOLN
INTRAMUSCULAR | Status: DC | PRN
  Administered 2022-08-14: 10 mL

## 2022-08-14 MED ORDER — EPHEDRINE 5 MG/ML INJ
INTRAVENOUS | Status: AC
  Filled 2022-08-14: qty 5

## 2022-08-14 MED ORDER — BUPIVACAINE HCL (PF) 0.5 % IJ SOLN
INTRAMUSCULAR | Status: AC
  Filled 2022-08-14: qty 30

## 2022-08-14 MED ORDER — PANTOPRAZOLE SODIUM 40 MG PO TBEC
40.0000 mg | DELAYED_RELEASE_TABLET | Freq: Every day | ORAL | Status: DC
  Administered 2022-08-15 – 2022-08-16 (×2): 40 mg via ORAL
  Filled 2022-08-14 (×2): qty 1

## 2022-08-14 MED ORDER — ACETAMINOPHEN 10 MG/ML IV SOLN
INTRAVENOUS | Status: DC | PRN
  Administered 2022-08-14: 1000 mg via INTRAVENOUS

## 2022-08-14 MED ORDER — GLYCOPYRROLATE PF 0.2 MG/ML IJ SOSY
PREFILLED_SYRINGE | INTRAMUSCULAR | Status: AC
  Filled 2022-08-14: qty 1

## 2022-08-14 MED ORDER — PROPOFOL 1000 MG/100ML IV EMUL
INTRAVENOUS | Status: AC
  Filled 2022-08-14: qty 100

## 2022-08-14 MED ORDER — SUGAMMADEX SODIUM 200 MG/2ML IV SOLN
INTRAVENOUS | Status: DC | PRN
  Administered 2022-08-14: 200 mg via INTRAVENOUS

## 2022-08-14 MED ORDER — BACITRACIN ZINC 500 UNIT/GM EX OINT
TOPICAL_OINTMENT | CUTANEOUS | Status: AC
  Filled 2022-08-14: qty 28.35

## 2022-08-14 MED ORDER — SUCCINYLCHOLINE CHLORIDE 200 MG/10ML IV SOSY
PREFILLED_SYRINGE | INTRAVENOUS | Status: AC
  Filled 2022-08-14: qty 10

## 2022-08-14 MED ORDER — 0.9 % SODIUM CHLORIDE (POUR BTL) OPTIME
TOPICAL | Status: DC | PRN
  Administered 2022-08-14 (×2): 1000 mL

## 2022-08-14 MED ORDER — ONDANSETRON HCL 4 MG/2ML IJ SOLN: 4.0000 mg | Freq: Four times a day (QID) | INTRAMUSCULAR | Status: DC | PRN

## 2022-08-14 MED ORDER — ACETAMINOPHEN 160 MG/5ML PO SOLN: 1000.0000 mg | Freq: Four times a day (QID) | ORAL | Status: DC

## 2022-08-14 MED ORDER — BISACODYL 5 MG PO TBEC
10.0000 mg | DELAYED_RELEASE_TABLET | Freq: Every day | ORAL | Status: DC
  Administered 2022-08-14 – 2022-08-16 (×3): 10 mg via ORAL
  Filled 2022-08-14 (×3): qty 2

## 2022-08-14 MED ORDER — ROCURONIUM BROMIDE 10 MG/ML (PF) SYRINGE
PREFILLED_SYRINGE | INTRAVENOUS | Status: AC
  Filled 2022-08-14: qty 10

## 2022-08-14 MED ORDER — NITROGLYCERIN 0.4 MG SL SUBL: 0.4000 mg | SUBLINGUAL_TABLET | SUBLINGUAL | Status: DC | PRN

## 2022-08-14 MED ORDER — BUPIVACAINE HCL (PF) 0.5 % IJ SOLN
INTRAMUSCULAR | Status: DC | PRN
  Administered 2022-08-14: 30 mL

## 2022-08-14 MED ORDER — KETAMINE HCL 10 MG/ML IJ SOLN
INTRAMUSCULAR | Status: DC | PRN
  Administered 2022-08-14: 10 mg via INTRAVENOUS
  Administered 2022-08-14: 30 mg via INTRAVENOUS
  Administered 2022-08-14: 10 mg via INTRAVENOUS

## 2022-08-14 MED ORDER — LACTATED RINGERS IV SOLN: INTRAVENOUS | Status: DC | PRN

## 2022-08-14 MED ORDER — ATROPINE SULFATE 0.4 MG/ML IV SOLN
INTRAVENOUS | Status: AC
  Filled 2022-08-14: qty 1

## 2022-08-14 MED ORDER — FENTANYL CITRATE (PF) 250 MCG/5ML IJ SOLN
INTRAMUSCULAR | Status: AC
  Filled 2022-08-14: qty 5

## 2022-08-14 MED ORDER — THROMBIN 20000 UNITS EX SOLR
CUTANEOUS | Status: DC | PRN
  Administered 2022-08-14: 5 mL via TOPICAL

## 2022-08-14 MED ORDER — LIDOCAINE 2% (20 MG/ML) 5 ML SYRINGE
INTRAMUSCULAR | Status: DC | PRN
  Administered 2022-08-14: 80 mg via INTRAVENOUS

## 2022-08-14 MED ORDER — SENNOSIDES-DOCUSATE SODIUM 8.6-50 MG PO TABS
1.0000 | ORAL_TABLET | Freq: Every day | ORAL | Status: DC
  Administered 2022-08-15: 1 via ORAL
  Filled 2022-08-14: qty 1

## 2022-08-14 MED ORDER — PROPOFOL 10 MG/ML IV BOLUS
INTRAVENOUS | Status: AC
  Filled 2022-08-14: qty 20

## 2022-08-14 MED ORDER — HYDRALAZINE HCL 20 MG/ML IJ SOLN: 10.0000 mg | Freq: Four times a day (QID) | INTRAMUSCULAR | Status: DC | PRN

## 2022-08-14 MED ORDER — BACITRACIN ZINC 500 UNIT/GM EX OINT
TOPICAL_OINTMENT | CUTANEOUS | Status: DC | PRN
  Administered 2022-08-14: 1 via TOPICAL

## 2022-08-14 MED ORDER — KETAMINE HCL 50 MG/5ML IJ SOSY
PREFILLED_SYRINGE | INTRAMUSCULAR | Status: AC
  Filled 2022-08-14: qty 5

## 2022-08-14 MED ORDER — HEMOSTATIC AGENTS (NO CHARGE) OPTIME
TOPICAL | Status: DC | PRN
  Administered 2022-08-14: 1 via TOPICAL

## 2022-08-14 MED ORDER — PHENYLEPHRINE 80 MCG/ML (10ML) SYRINGE FOR IV PUSH (FOR BLOOD PRESSURE SUPPORT)
PREFILLED_SYRINGE | INTRAVENOUS | Status: AC
  Filled 2022-08-14: qty 10

## 2022-08-14 MED ORDER — FENTANYL CITRATE (PF) 250 MCG/5ML IJ SOLN
INTRAMUSCULAR | Status: DC | PRN
  Administered 2022-08-14: 50 ug via INTRAVENOUS
  Administered 2022-08-14: 100 ug via INTRAVENOUS
  Administered 2022-08-14 (×3): 50 ug via INTRAVENOUS
  Administered 2022-08-14: 100 ug via INTRAVENOUS

## 2022-08-14 MED ORDER — BUPIVACAINE LIPOSOME 1.3 % IJ SUSP
INTRAMUSCULAR | Status: AC
  Filled 2022-08-14: qty 20

## 2022-08-14 MED ORDER — CEFAZOLIN SODIUM-DEXTROSE 2-4 GM/100ML-% IV SOLN
2.0000 g | Freq: Three times a day (TID) | INTRAVENOUS | Status: AC
  Administered 2022-08-14 – 2022-08-15 (×2): 2 g via INTRAVENOUS
  Filled 2022-08-14 (×2): qty 100

## 2022-08-14 MED ORDER — THROMBIN 20000 UNITS EX SOLR
CUTANEOUS | Status: AC
  Filled 2022-08-14: qty 20000

## 2022-08-14 MED ORDER — PROPOFOL 10 MG/ML IV BOLUS
INTRAVENOUS | Status: DC | PRN
Start: 2022-08-14 — End: 2022-08-14
  Administered 2022-08-14: 50 mg via INTRAVENOUS
  Administered 2022-08-14: 200 mg via INTRAVENOUS

## 2022-08-14 MED ORDER — ISOSORBIDE MONONITRATE ER 30 MG PO TB24
15.0000 mg | ORAL_TABLET | Freq: Every day | ORAL | Status: DC
  Administered 2022-08-15 – 2022-08-16 (×2): 15 mg via ORAL
  Filled 2022-08-14 (×2): qty 1

## 2022-08-14 MED ORDER — LIDOCAINE-EPINEPHRINE 0.5 %-1:200000 IJ SOLN
INTRAMUSCULAR | Status: AC
  Filled 2022-08-14: qty 50

## 2022-08-14 MED ORDER — CEFAZOLIN SODIUM-DEXTROSE 2-4 GM/100ML-% IV SOLN
2.0000 g | INTRAVENOUS | Status: AC
  Administered 2022-08-14 (×2): 2 g via INTRAVENOUS
  Filled 2022-08-14: qty 100

## 2022-08-14 MED ORDER — ONDANSETRON HCL 4 MG/2ML IJ SOLN
INTRAMUSCULAR | Status: DC | PRN
Start: 2022-08-14 — End: 2022-08-14
  Administered 2022-08-14: 4 mg via INTRAVENOUS

## 2022-08-14 MED ORDER — DEXAMETHASONE SODIUM PHOSPHATE 10 MG/ML IJ SOLN
INTRAMUSCULAR | Status: AC
  Filled 2022-08-14: qty 1

## 2022-08-14 MED ORDER — EZETIMIBE 10 MG PO TABS
10.0000 mg | ORAL_TABLET | Freq: Every day | ORAL | Status: DC
  Administered 2022-08-15 – 2022-08-16 (×2): 10 mg via ORAL
  Filled 2022-08-14 (×2): qty 1

## 2022-08-14 MED ORDER — TRAMADOL HCL 50 MG PO TABS
50.0000 mg | ORAL_TABLET | Freq: Four times a day (QID) | ORAL | Status: DC | PRN
  Administered 2022-08-15 – 2022-08-16 (×4): 100 mg via ORAL
  Filled 2022-08-14 (×4): qty 2
  Filled 2022-08-14: qty 1

## 2022-08-14 MED ORDER — THROMBIN 5000 UNITS EX SOLR
CUTANEOUS | Status: AC
  Filled 2022-08-14: qty 5000

## 2022-08-14 MED ORDER — SODIUM CHLORIDE 0.45 % IV SOLN: INTRAVENOUS | Status: DC

## 2022-08-14 MED ORDER — BUPIVACAINE LIPOSOME 1.3 % IJ SUSP
INTRAMUSCULAR | Status: DC | PRN
  Administered 2022-08-14: 100 mL

## 2022-08-14 MED ORDER — THROMBIN 5000 UNITS EX SOLR
OROMUCOSAL | Status: DC | PRN
  Administered 2022-08-14: 5 mL via TOPICAL

## 2022-08-14 MED ORDER — DEXAMETHASONE 4 MG PO TABS
4.0000 mg | ORAL_TABLET | Freq: Three times a day (TID) | ORAL | Status: AC
  Administered 2022-08-14 – 2022-08-15 (×2): 4 mg via ORAL
  Filled 2022-08-14 (×3): qty 1

## 2022-08-14 MED ORDER — LIDOCAINE 2% (20 MG/ML) 5 ML SYRINGE
INTRAMUSCULAR | Status: AC
  Filled 2022-08-14: qty 5

## 2022-08-14 MED ORDER — PHENYLEPHRINE HCL-NACL 20-0.9 MG/250ML-% IV SOLN
INTRAVENOUS | Status: DC | PRN
  Administered 2022-08-14: 50 ug/min via INTRAVENOUS

## 2022-08-14 MED ORDER — MIDAZOLAM HCL 5 MG/5ML IJ SOLN
INTRAMUSCULAR | Status: DC | PRN
  Administered 2022-08-14: 1 mg via INTRAVENOUS

## 2022-08-14 MED ORDER — METOPROLOL SUCCINATE ER 25 MG PO TB24
25.0000 mg | ORAL_TABLET | Freq: Every day | ORAL | Status: DC
  Administered 2022-08-15 – 2022-08-16 (×2): 25 mg via ORAL
  Filled 2022-08-14 (×2): qty 1

## 2022-08-14 MED ORDER — MIDAZOLAM HCL 2 MG/2ML IJ SOLN
INTRAMUSCULAR | Status: AC
  Filled 2022-08-14: qty 2

## 2022-08-14 MED ORDER — ACETAMINOPHEN 10 MG/ML IV SOLN
INTRAVENOUS | Status: AC
  Filled 2022-08-14: qty 100

## 2022-08-14 MED ORDER — ONDANSETRON HCL 4 MG/2ML IJ SOLN
INTRAMUSCULAR | Status: AC
  Filled 2022-08-14: qty 2

## 2022-08-14 MED ORDER — DEXAMETHASONE SODIUM PHOSPHATE 10 MG/ML IJ SOLN
INTRAMUSCULAR | Status: DC | PRN
  Administered 2022-08-14: 10 mg via INTRAVENOUS

## 2022-08-14 MED ORDER — CHLORHEXIDINE GLUCONATE CLOTH 2 % EX PADS
6.0000 | MEDICATED_PAD | Freq: Once | CUTANEOUS | Status: AC
  Administered 2022-08-14: 6 via TOPICAL

## 2022-08-14 MED ORDER — ENOXAPARIN SODIUM 40 MG/0.4ML IJ SOSY: 40.0000 mg | PREFILLED_SYRINGE | Freq: Every day | INTRAMUSCULAR | Status: DC

## 2022-08-14 MED ORDER — CHLORHEXIDINE GLUCONATE 0.12 % MT SOLN
15.0000 mL | Freq: Once | OROMUCOSAL | Status: AC
  Administered 2022-08-14: 15 mL via OROMUCOSAL
  Filled 2022-08-14: qty 15

## 2022-08-14 MED ORDER — ROCURONIUM BROMIDE 10 MG/ML (PF) SYRINGE
PREFILLED_SYRINGE | INTRAVENOUS | Status: DC | PRN
  Administered 2022-08-14 (×4): 20 mg via INTRAVENOUS
  Administered 2022-08-14: 60 mg via INTRAVENOUS
  Administered 2022-08-14 (×2): 20 mg via INTRAVENOUS

## 2022-08-14 MED ORDER — OXYCODONE HCL 5 MG PO TABS
5.0000 mg | ORAL_TABLET | ORAL | Status: DC | PRN
  Administered 2022-08-14 – 2022-08-15 (×3): 10 mg via ORAL
  Filled 2022-08-14 (×3): qty 2

## 2022-08-14 MED ORDER — MORPHINE SULFATE (PF) 2 MG/ML IV SOLN
2.0000 mg | INTRAVENOUS | Status: DC | PRN
  Administered 2022-08-15: 2 mg via INTRAVENOUS
  Filled 2022-08-14: qty 1

## 2022-08-14 MED ORDER — PHENYLEPHRINE HCL (PRESSORS) 10 MG/ML IV SOLN
INTRAVENOUS | Status: DC | PRN
  Administered 2022-08-14: 80 ug via INTRAVENOUS

## 2022-08-14 MED ORDER — EPHEDRINE SULFATE-NACL 50-0.9 MG/10ML-% IV SOSY
PREFILLED_SYRINGE | INTRAVENOUS | Status: DC | PRN
  Administered 2022-08-14: 10 mg via INTRAVENOUS
  Administered 2022-08-14 (×3): 5 mg via INTRAVENOUS
  Administered 2022-08-14: 10 mg via INTRAVENOUS

## 2022-08-14 MED ORDER — ACETAMINOPHEN 500 MG PO TABS
1000.0000 mg | ORAL_TABLET | Freq: Four times a day (QID) | ORAL | Status: DC
  Administered 2022-08-14 – 2022-08-16 (×5): 1000 mg via ORAL
  Filled 2022-08-14 (×6): qty 2

## 2022-08-14 MED ORDER — ORAL CARE MOUTH RINSE: 15.0000 mL | Freq: Once | OROMUCOSAL | Status: AC

## 2022-08-14 MED ORDER — ORAL CARE MOUTH RINSE: 15.0000 mL | OROMUCOSAL | Status: DC | PRN

## 2022-08-14 MED ORDER — LACTATED RINGERS IV SOLN: INTRAVENOUS | Status: DC

## 2022-08-14 SURGICAL SUPPLY — 113 items
ADH SKN CLS APL DERMABOND .7 (GAUZE/BANDAGES/DRESSINGS) ×2
APL PRP STRL LF DISP 70% ISPRP (MISCELLANEOUS) ×2
BAG COUNTER SPONGE SURGICOUNT (BAG) ×3 IMPLANT
BAG SPEC RTRVL C125 8X14 (MISCELLANEOUS) ×2
BAG SPNG CNTER NS LX DISP (BAG) ×2
BLADE CLIPPER SURG (BLADE) IMPLANT
BLADE STERNUM SYSTEM 6 (BLADE) ×3 IMPLANT
BLADE SURG 11 STRL SS (BLADE) IMPLANT
BUR MATCHSTICK NEURO 3.0 LAGG (BURR) IMPLANT
BUR PRECISION FLUTE 5.0 (BURR) IMPLANT
CANISTER SUCT 3000ML PPV (MISCELLANEOUS) ×3 IMPLANT
CASSETTE SUCT IRRIG SONOPET IQ (MISCELLANEOUS) IMPLANT
CATH THORACIC 28FR (CATHETERS) IMPLANT
CAUTERY SPATULA MNPLR 1.7 DVNC (INSTRUMENTS) IMPLANT
CHLORAPREP W/TINT 26 (MISCELLANEOUS) ×3 IMPLANT
CLIP LIGATING HEMO O LOK GREEN (MISCELLANEOUS) IMPLANT
CNTNR URN SCR LID CUP LEK RST (MISCELLANEOUS) ×12 IMPLANT
CONT SPEC 4OZ STRL OR WHT (MISCELLANEOUS) ×8
DEFOGGER SCOPE WARMER CLEARIFY (MISCELLANEOUS) ×3 IMPLANT
DERMABOND ADVANCED .7 DNX12 (GAUZE/BANDAGES/DRESSINGS) ×6 IMPLANT
DRAPE ARM DVNC X/XI (DISPOSABLE) ×12 IMPLANT
DRAPE C-ARM 42X72 X-RAY (DRAPES) IMPLANT
DRAPE COLUMN DVNC XI (DISPOSABLE) ×3 IMPLANT
DRAPE CV SPLIT W-CLR ANES SCRN (DRAPES) ×3 IMPLANT
DRAPE LAPAROTOMY 100X72X124 (DRAPES) ×3 IMPLANT
DRAPE LAPAROTOMY T 102X78X121 (DRAPES) IMPLANT
DRAPE MICROSCOPE SLANT 54X150 (MISCELLANEOUS) ×3 IMPLANT
DRAPE ORTHO SPLIT 77X108 STRL (DRAPES) ×2
DRAPE SURG 17X23 STRL (DRAPES) ×6 IMPLANT
DRAPE SURG ORHT 6 SPLT 77X108 (DRAPES) ×3 IMPLANT
DRSG OPSITE POSTOP 4X6 (GAUZE/BANDAGES/DRESSINGS) IMPLANT
ELECT REM PT RETURN 9FT ADLT (ELECTROSURGICAL) ×4
ELECT SOLID GEL RDN PRO-PADZ (MISCELLANEOUS) ×2
ELECTRODE REM PT RTRN 9FT ADLT (ELECTROSURGICAL) ×6 IMPLANT
ELECTRODE SOLI GEL RDN PROPADZ (MISCELLANEOUS) IMPLANT
FELT TEFLON 1X6 (MISCELLANEOUS) IMPLANT
FORCEPS BIPOLAR SPETZLER 8 1.0 (NEUROSURGERY SUPPLIES) IMPLANT
FORCEPS BPLR LNG DVNC XI (INSTRUMENTS) IMPLANT
FORCEPS CADIERE DVNC XI (FORCEP) IMPLANT
GAUZE 4X4 16PLY ~~LOC~~+RFID DBL (SPONGE) IMPLANT
GAUZE KITTNER 4X5 RF (MISCELLANEOUS) ×9 IMPLANT
GAUZE SPONGE 4X4 12PLY STRL (GAUZE/BANDAGES/DRESSINGS) ×3 IMPLANT
GLOVE BIO SURGEON STRL SZ7.5 (GLOVE) ×9 IMPLANT
GLOVE ECLIPSE 6.5 STRL STRAW (GLOVE) ×3 IMPLANT
GLOVE EXAM NITRILE XL STR (GLOVE) IMPLANT
GOWN STRL REUS W/ TWL LRG LVL3 (GOWN DISPOSABLE) ×3 IMPLANT
GOWN STRL REUS W/ TWL XL LVL3 (GOWN DISPOSABLE) ×6 IMPLANT
GOWN STRL REUS W/TWL 2XL LVL3 (GOWN DISPOSABLE) ×3 IMPLANT
GOWN STRL REUS W/TWL LRG LVL3 (GOWN DISPOSABLE) ×8
GOWN STRL REUS W/TWL XL LVL3 (GOWN DISPOSABLE) ×4
GRASPER TIP-UP FEN DVNC XI (INSTRUMENTS) IMPLANT
HEMOSTAT POWDER KIT SURGIFOAM (HEMOSTASIS) IMPLANT
HEMOSTAT SURGICEL 2X14 (HEMOSTASIS) ×3 IMPLANT
INSERT SUTURE HOLDER (MISCELLANEOUS) IMPLANT
IRRIGATOR SUCT 8 DISP DVNC XI (IRRIGATION / IRRIGATOR) IMPLANT
KIT BASIN OR (CUSTOM PROCEDURE TRAY) ×3 IMPLANT
KIT SUCTION CATH 14FR (SUCTIONS) IMPLANT
KIT TURNOVER KIT B (KITS) ×3 IMPLANT
NDL HYPO 22X1.5 SAFETY MO (MISCELLANEOUS) ×6 IMPLANT
NDL SPNL 20GX3.5 QUINCKE YW (NEEDLE) IMPLANT
NEEDLE HYPO 22X1.5 SAFETY MO (MISCELLANEOUS) ×4 IMPLANT
NEEDLE SPNL 20GX3.5 QUINCKE YW (NEEDLE) IMPLANT
NS IRRIG 1000ML POUR BTL (IV SOLUTION) ×3 IMPLANT
PACK CHEST (CUSTOM PROCEDURE TRAY) ×3 IMPLANT
PACK LAMINECTOMY NEURO (CUSTOM PROCEDURE TRAY) ×3 IMPLANT
PAD ARMBOARD 7.5X6 YLW CONV (MISCELLANEOUS) ×6 IMPLANT
PAD ELECT DEFIB RADIOL ZOLL (MISCELLANEOUS) ×3 IMPLANT
PATTIES SURGICAL .5 X.5 (GAUZE/BANDAGES/DRESSINGS) IMPLANT
PATTIES SURGICAL .5 X3 (DISPOSABLE) ×3 IMPLANT
SEAL UNIV 5-12 XI (MISCELLANEOUS) ×12 IMPLANT
SET TRI-LUMEN FLTR TB AIRSEAL (TUBING) ×3 IMPLANT
SOL ELECTROSURG ANTI STICK (MISCELLANEOUS)
SOLUTION ELECTROSURG ANTI STCK (MISCELLANEOUS) ×3 IMPLANT
SPONGE SURGIFOAM ABS GEL 100 (HEMOSTASIS) IMPLANT
SPONGE T-LAP 18X18 ~~LOC~~+RFID (SPONGE) ×3 IMPLANT
SPONGE T-LAP 4X18 ~~LOC~~+RFID (SPONGE) IMPLANT
SPONGE TONSIL 1 RF SGL (DISPOSABLE) ×3 IMPLANT
STAPLER VISISTAT 35W (STAPLE) ×3 IMPLANT
SUT BONE WAX W31G (SUTURE) IMPLANT
SUT ETHILON 4 0 PS 2 18 (SUTURE) ×3 IMPLANT
SUT MNCRL AB 3-0 PS2 18 (SUTURE) IMPLANT
SUT NURALON 4 0 TR CR/8 (SUTURE) IMPLANT
SUT PDS AB 1 CTX 36 (SUTURE) IMPLANT
SUT PROLENE 6 0 BV (SUTURE) IMPLANT
SUT SILK 1 MH (SUTURE) ×3 IMPLANT
SUT SILK 2 0 SH CR/8 (SUTURE) IMPLANT
SUT STEEL 6MS V (SUTURE) IMPLANT
SUT STEEL SZ 6 DBL 3X14 BALL (SUTURE) IMPLANT
SUT VIC AB 0 CT1 18XCR BRD8 (SUTURE) IMPLANT
SUT VIC AB 0 CT1 8-18 (SUTURE) ×6
SUT VIC AB 2-0 CT1 18 (SUTURE) ×3 IMPLANT
SUT VIC AB 2-0 CT1 27 (SUTURE) ×2
SUT VIC AB 2-0 CT1 TAPERPNT 27 (SUTURE) IMPLANT
SUT VIC AB 2-0 CTX 36 (SUTURE) IMPLANT
SUT VIC AB 3-0 SH 27 (SUTURE) ×4
SUT VIC AB 3-0 SH 27X BRD (SUTURE) ×6 IMPLANT
SUT VIC AB 3-0 SH 8-18 (SUTURE) IMPLANT
SUT VICRYL 0 TIES 12 18 (SUTURE) ×3 IMPLANT
SUT VICRYL 0 UR6 27IN ABS (SUTURE) ×6 IMPLANT
SUT VICRYL 4-0 PS2 18IN ABS (SUTURE) IMPLANT
SYR 20CC LL (SYRINGE) ×3 IMPLANT
SYSTEM RETRIEVAL ANCHOR 8 (MISCELLANEOUS) IMPLANT
SYSTEM SAHARA CHEST DRAIN ATS (WOUND CARE) IMPLANT
TAPE CLOTH SURG 4X10 WHT LF (GAUZE/BANDAGES/DRESSINGS) IMPLANT
TIP TISSUE SONOPET IQ STD 12 (TIP) IMPLANT
TOWEL GREEN STERILE (TOWEL DISPOSABLE) ×3 IMPLANT
TOWEL GREEN STERILE FF (TOWEL DISPOSABLE) ×3 IMPLANT
TRAY FOLEY MTR SLVR 16FR STAT (SET/KITS/TRAYS/PACK) IMPLANT
TRAY FOLEY SLVR 16FR TEMP STAT (SET/KITS/TRAYS/PACK) IMPLANT
TROCAR PORT AIRSEAL 12X150 (TUBING) IMPLANT
TROCAR PORT AIRSEAL 8X120 (TROCAR) IMPLANT
TUBE CONNECTING 12X1/4 (SUCTIONS) IMPLANT
WATER STERILE IRR 1000ML POUR (IV SOLUTION) ×3 IMPLANT

## 2022-08-14 NOTE — Transfer of Care (Signed)
Immediate Anesthesia Transfer of Care Note  Patient: [name]  Procedure(s) Performed: XI ROBOTIC ASSISTED RESECTION OF MEDIASTINAL MASS (Right: Chest) THORACIC LAMINECTOMY FOR TUMOR  Patient Location: PACU  Anesthesia Type:General  Level of Consciousness: awake and alert   Airway & Oxygen Therapy: Patient Spontanous Breathing and Patient connected to nasal cannula oxygen  Post-op Assessment: Report given to RN and Post -op Vital signs reviewed and stable  Post vital signs: Reviewed and stable  Last Vitals:  Vitals Value Taken Time  BP 116/58 08/14/22 1353  Temp    Pulse 75 08/14/22 1355  Resp 18 08/14/22 1355  SpO2 93 % 08/14/22 1355  Vitals shown include unfiled device data.  Last Pain:  Vitals:   08/14/22 0611  TempSrc:   PainSc: 0-No pain      Patients Stated Pain Goal: 0 (08/14/22 1610)  Complications: No notable events documented.

## 2022-08-14 NOTE — Brief Op Note (Signed)
08/14/2022  1:41 PM  PATIENT:  [name]  72 y.o. male  PRE-OPERATIVE DIAGNOSIS:  POSTERIOR MEDIASTINAL MASS  POST-OPERATIVE DIAGNOSIS:  POSTERIOR MEDIASTINAL MASS  PROCEDURE:  XI ROBOTIC ASSISTED RESECTION OF MEDIASTINAL MASS (Right) THORACIC LAMINECTOMY FOR TUMOR (N/A)  SURGEON:  Surgeons and Role: Panel 1:    * Lightfoot, Eliezer Lofts, MD - Primary Panel 2:    [name], MD - Primary  PHYSICIAN ASSISTANT: Aloha Gell PA-C  ASSISTANTS: none   ANESTHESIA:   local and general  EBL:  400 mL   BLOOD ADMINISTERED:none  DRAINS:  Mediastinal drain    LOCAL MEDICATIONS USED:  OTHER Exparel  SPECIMEN:  Source of Specimen:  Extradural tumor  DISPOSITION OF SPECIMEN:  PATHOLOGY  COUNTS:  YES  DICTATION: .Dragon Dictation  PLAN OF CARE: Admit to inpatient   PATIENT DISPOSITION:  PACU - hemodynamically stable.   Delay start of Pharmacological VTE agent (>24hrs) due to surgical blood loss or risk of bleeding: no

## 2022-08-14 NOTE — Anesthesia Procedure Notes (Addendum)
Arterial Line Insertion Start/End7/24/2024 7:00 AM, 08/14/2022 7:15 AM Performed by: Rachel Moulds, CRNA  Patient location: Pre-op. Preanesthetic checklist: patient identified, IV checked, site marked, risks and benefits discussed, surgical consent, monitors and equipment checked, pre-op evaluation, timeout performed and anesthesia consent Lidocaine 1% used for infiltration Left, radial was placed Catheter size: 20 G Hand hygiene performed  and maximum sterile barriers used  Allen's test indicative of satisfactory collateral circulation Attempts: 4 Procedure performed without using ultrasound guided technique. Following insertion, Biopatch and dressing applied. Post procedure complications: unsuccessful attempts and second provider assisted. Patient tolerated the procedure well with no immediate complications.

## 2022-08-14 NOTE — Op Note (Signed)
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 Due to continued growth I recommended operative resection, in conjunction with Dr. Cliffton Asters of the Cardiothroacic Surgery service. We will perform a staged procedure for tumor resection.   PRE-OPERATIVE DIAGNOSIS:  extradural  and POSTERIOR MEDIASTINAL MASS  POST-OPERATIVE DIAGNOSIS: Extradural and  POSTERIOR MEDIASTINAL MASS  PROCEDURE:  Procedure(s): XI ROBOTIC ASSISTED RESECTION OF MEDIASTINAL MASS THORACIC LAMINECTOMY right  T2 FOR TUMOR resection with Microdissection  SURGEON: Surgeon(s): Lightfoot, Eliezer Lofts, MD [name], MD  ASSISTANTS:  ANESTHESIA:   general  EBL:  Total I/O In: 2100 [I.V.:2100] Out: 696 [Urine:260; Blood:400; Chest Tube:36]  BLOOD ADMINISTERED:none  CELL SAVER GIVEN:not used  COUNT:per nursing  DRAINS: none   SPECIMEN:  Source of Specimen:  T2 right nerve root  DICTATION: [name] was taken to the operating room, intubated, and placed under a general anesthetic without difficulty. He was positioned prone on a Jackson table with all pressure points properly padded. He was prepped and draped in a sterile manner. I opened the incision with a 10 blade and dissected to the thoracolumbar fascia. I reflected the paraspinous musculature to expose the lamina of T1, T2, and T3 on the right side. I confirmed T2 with fluoroscopy and started the laminectomy.  I performed the laminectomy using the drill and Kerrioson punches with the assistance of Dr. Cliffton Asters. We exposed the mass lateral to the thecal sac at L2. We cauterized the capsule, opened it with a 15 blade and started to take samples to send to pathology. The mass was not particularly vascular, soft, mildly fibrous. We used the cusa to remove and hollow out the mass. Cautery to seal the edges to prevent bleeding and suction.  We eventually were  working lateral to the pedicles which were exposed with the tumor resection. We amputated the mass and controlled bleeding to achieve hemostasis. The resection site was irrigated.  We then closed the incision in layers approximating the thoracolumbar fascia, subcutaneous plane, and subcuticular plane with vicryl sutures. I used staples to approximate the skin edges. I applied a sterile dressing. We then repositioned the patient for the robotic assisted thoracotomy and tumor resection. That will be dictated under separate cover.   PLAN OF CARE: Admit to inpatient   PATIENT DISPOSITION:   second stage   Delay start of Pharmacological VTE agent (>24hrs) due to surgical blood loss or risk of bleeding:  yes

## 2022-08-14 NOTE — Op Note (Signed)
      [address]       [place] [address]             [phone]        08/14/2022  Patient:  [name] Pre-Op Dx: Extradural and posterior mediastinal mass   Post-op Dx:  same Procedure: - Robotic assisted right video thoracoscopy - Resection of posterior mediastinal mass - Intercostal nerve block  Surgeon and Role:      * Corliss Skains, MD - Primary  Assistant: B. Stehler, PA-C  An experienced assistant was required given the complexity of this surgery and the standard of surgical care. The assistant was needed for exposure, dissection, suctioning, retraction of delicate tissues and sutures, instrument exchange and for overall help during this procedure.    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 Will coordinate combo procedure with Dr. Franky Macho. He will require a Right RATS, and resection after a posterior approach by Dr. Franky Macho.  The risks and benefits have been discussed, and he is agreeable to proceed.   Findings: Complete resection of posterior mediastinal mass  Operative Technique: After the risks, benefits and alternatives were thoroughly discussed, the patient was brought to the operative theatre.  Anesthesia was induced, and the patient was then placed in a lateral decubitus position and was prepped and draped in normal sterile fashion.  An appropriate surgical pause was performed, and pre-operative antibiotics were dosed accordingly.  We began by placing our 4 robotic ports in the the 7th intercostal space targeting the posterior mediastinal mass.   A 12mm assistant port was placed in the 9th intercostal space in the anterior axillary line.  The robot was then docked and all instruments were passed under direct visualization.     There were adhesion along the upper lobe that were divided for improved visualization.  We then began to mobilize the mass circumferentially off of the posterior chest wall.  We continued our dissection posteriorly, and entered the cavity that was previously dissected by Dr. Franky Macho.  There was a branch of the azygous vein that was divided.  The mass was then placed in an endocatch bag and removed from the anterior port.     An intercostal nerve block was performed under direct visualization.  A 28 F chest tube was then placed, and we watch the remaining lobes re-expand.  The skin and soft tissue were closed with absorbable suture    The patient tolerated the procedure without any immediate complications, and was transferred to the PACU in stable condition.  [name]

## 2022-08-14 NOTE — Plan of Care (Signed)
  Problem: Education: Goal: Knowledge of disease or condition will improve Outcome: Progressing Goal: Knowledge of the prescribed therapeutic regimen will improve Outcome: Progressing   

## 2022-08-14 NOTE — Discharge Instructions (Addendum)
Robot-Assisted Thoracic Surgery, Care After The following information offers guidance on how to care for yourself after your procedure. Your health care provider may also give you more specific instructions. If you have problems or questions, contact your health care provider. What can I expect after the procedure? After the procedure, it is common to have: Some pain and aches in the area of your surgical incisions. Pain when breathing in (inhaling) and coughing. Tiredness (fatigue). Trouble sleeping. Constipation. Follow these instructions at home: Medicines Take over-the-counter and prescription medicines only as told by your health care provider. If you were prescribed an antibiotic medicine, take it as told by your health care provider. Do not stop taking the antibiotic even if you start to feel better. Talk with your health care provider about safe and effective ways to manage pain after your procedure. Pain management should fit your specific health needs. Take pain medicine before pain becomes severe. Relieving and controlling your pain will make breathing easier for you. Ask your health care provider if the medicine prescribed to you requires you to avoid driving or using machinery. Eating and drinking Follow instructions from your health care provider about eating or drinking restrictions. These will vary depending on what procedure you had. Your health care provider may recommend: A liquid diet or soft diet for the first few days. Meals that are smaller and more frequent. A diet of fruits, vegetables, whole grains, and low-fat proteins. Limiting foods that are high in fat and processed sugar, including fried or sweet foods. Incision care Follow instructions from your health care provider about how to take care of your incisions. Make sure you: Wash your hands with soap and water for at least 20 seconds before and after you change your bandage (dressing). If soap and water are not  available, use hand sanitizer. Change your dressing as told by your health care provider. Leave stitches (sutures), skin glue, or adhesive strips in place. These skin closures may need to stay in place for 2 weeks or longer. If adhesive strip edges start to loosen and curl up, you may trim the loose edges. Do not remove adhesive strips completely unless your health care provider tells you to do that. Check your incision area every day for signs of infection. Check for: Redness, swelling, or more pain. Fluid or blood. Warmth. Pus or a bad smell. Activity Return to your normal activities as told by your health care provider. Ask your health care provider what activities are safe for you. Ask your health care provider when it is safe for you to drive. Do not lift anything that is heavier than 10 lb (4.5 kg), or the limit that you are told, until your health care provider says that it is safe. Rest as told by your health care provider. Avoid sitting for a long time without moving. Get up to take short walks every 1-2 hours. This is important to improve blood flow and breathing. Ask for help if you feel weak or unsteady. Do exercises as told by your health care provider. Pneumonia prevention  Do deep breathing exercises and cough regularly as directed. This helps clear mucus and opens your lungs. Doing this helps prevent lung infection (pneumonia). If you were given an incentive spirometer, use it as told. An incentive spirometer is a tool that measures how well you are filling your lungs with each breath. Coughing may hurt less if you try to support your chest. This is called splinting. Try one of these when you  cough: Hold a pillow against your chest. Place the palms of both hands on top of your incision area. Do not use any products that contain nicotine or tobacco. These products include cigarettes, chewing tobacco, and vaping devices, such as e-cigarettes. If you need help quitting, ask your  health care provider. Avoid secondhand smoke. General instructions If you have a drainage tube: Follow instructions from your health care provider about how to take care of it. Do not travel by airplane after your tube is removed until your health care provider tells you it is safe. You may need to take these actions to prevent or treat constipation: Drink enough fluid to keep your urine pale yellow. Take over-the-counter or prescription medicines. Eat foods that are high in fiber, such as beans, whole grains, and fresh fruits and vegetables. Limit foods that are high in fat and processed sugars, such as fried or sweet foods. Keep all follow-up visits. This is important. Contact a health care provider if: You have redness, swelling, or more pain around an incision. You have fluid or blood coming from an incision. An incision feels warm to the touch. You have pus or a bad smell coming from an incision. You have a fever. You cannot eat or drink without vomiting. Your pain medicine is not controlling your pain. Get help right away if: You have chest pain. Your heart is beating quickly. You have trouble breathing. You have trouble speaking. You are confused. You feel weak or dizzy, or you faint. These symptoms may represent a serious problem that is an emergency. Do not wait to see if the symptoms will go away. Get medical help right away. Call your local emergency services (911 in the [place] Do not drive yourself to the hospital. Summary Talk with your health care provider about safe and effective ways to manage pain after your procedure. Pain management should fit your specific health needs. Return to your normal activities as told by your health care provider. Ask your health care provider what activities are safe for you. Do deep breathing exercises and cough regularly as directed. This helps to clear mucus and prevent pneumonia. If it hurts to cough, ease pain by holding a pillow  against your chest or by placing the palms of both hands over your incisions. This information is not intended to replace advice given to you by your health care provider. Make sure you discuss any questions you have with your health care provider. Document Revised: 09/30/2019 Document Reviewed: 10/01/2019 Elsevier Patient Education  2024 [employer]  Discharge Instructions:  1. You may shower, please wash incisions daily with soap and water and keep dry.  If you wish to cover wounds with dressing you may do so but please keep clean and change daily.  No tub baths or swimming until incisions have completely healed.  If your incisions become red or develop any drainage please call our office at [phone]  2. No Driving until cleared by Dr. Lucilla Lame office and you are no longer using narcotic pain medications  3. Fever of 101.5 for at least 24 hours with no source, please contact our office at [phone]  4. Activity- up as tolerated, please walk at least 3 times per day.  Avoid strenuous activity, no lifting, pushing, or pulling with your arms over 8-10 lbs until seen in the office  5. If any questions or concerns arise, please do not hesitate to contact our office at [phone]  .kc

## 2022-08-14 NOTE — H&P (Signed)
[name] is an 72 y.o. male.   Chief Complaint: extradural tumor T2 HPI: [name] is a 72 y.o. male With an enlarging mass, most likely a schwannoma with an intrathoraic extension  Past Medical History:  Diagnosis Date   Adenomatous colon polyp 2004   Allergy    Anginal pain (HCC)    Arthritis    Diverticulosis 2011   Dyspnea    Former smoker    GERD (gastroesophageal reflux disease)    HLD (hyperlipidemia)    Hyperplastic colon polyp 2004   Hypertension    Melanoma in situ [facility])     Past Surgical History:  Procedure Laterality Date   COLONOSCOPY  01/21/2009   SEVERAL    CORONARY STENT INTERVENTION N/A 10/04/2020   Procedure: CORONARY STENT INTERVENTION;  Surgeon: Marykay Lex, MD;  Location: [facility] INVASIVE CV LAB;  Service: Cardiovascular;  Laterality: N/A;   LEFT HEART CATH AND CORONARY ANGIOGRAPHY N/A 10/04/2020   Procedure: LEFT HEART CATH AND CORONARY ANGIOGRAPHY;  Surgeon: Marykay Lex, MD;  Location: [facility] INVASIVE CV LAB;  Service: Cardiovascular;  Laterality: N/A;   LEFT HEART CATH AND CORONARY ANGIOGRAPHY Left 11/30/2020   Procedure: LEFT HEART CATH AND CORONARY ANGIOGRAPHY;  Surgeon: Marykay Lex, MD;  Location: [facility] INVASIVE CV LAB;  Service: Cardiovascular;  Laterality: Left;   LYMPH NODE DISSECTION Bilateral 12/28/2021   Procedure: PELVIC LYMPH NODE DISSECTION;  Surgeon: [name], MD;  Location: [facility] ORS;  Service: Urology;  Laterality: Bilateral;   ROBOT ASSISTED LAPAROSCOPIC RADICAL PROSTATECTOMY N/A 12/28/2021   Procedure: XI ROBOTIC ASSISTED LAPAROSCOPIC RADICAL PROSTATECTOMY AND INDOCYANINE GREEN DYE INJECTION;  Surgeon: [name], MD;  Location: [facility] ORS;  Service: Urology;  Laterality: N/A;  3 HRS   TOOTH EXTRACTION     TOTAL KNEE ARTHROPLASTY Right 04/26/2020    Family History  Problem Relation Age of Onset   Heart disease Mother        died in her 36's   Lung cancer Father    Diabetes Daughter    Social History:  reports that  he quit smoking about 24 years ago. His smoking use included cigarettes and cigars. He started smoking about 54 years ago. He has a 30 pack-year smoking history. His smokeless tobacco use includes chew. He reports current alcohol use of about 2.0 standard drinks of alcohol per week. He reports that he does not use drugs.  Allergies:  Allergies  Allergen Reactions   Levaquin [Levofloxacin In D5w] Shortness Of Breath    Medications Prior to Admission  Medication Sig Dispense Refill   cetirizine (ZYRTEC) 10 MG tablet TAKE 1 TABLET BY MOUTH EVERY DAY 90 tablet 0   ezetimibe (ZETIA) 10 MG tablet TAKE 1 TABLET BY MOUTH EVERY DAY 90 tablet 3   isosorbide mononitrate (IMDUR) 30 MG 24 hr tablet Take 0.5 tablets (15 mg total) by mouth daily. 45 tablet 2   losartan (COZAAR) 50 MG tablet Take 0.5 tablets (25 mg total) by mouth in the morning. 45 tablet 1   metoprolol succinate (TOPROL-XL) 25 MG 24 hr tablet TAKE 1 TABLET (25 MG TOTAL) BY MOUTH DAILY. 90 tablet 3   Multiple Vitamins-Minerals (CENTRUM SILVER 50+MEN) TABS Take 1 tablet by mouth daily.     nitroGLYCERIN (NITROSTAT) 0.4 MG SL tablet PLACE 1 TABLET UNDER THE TONGUE EVERY 5 MINUTES AS NEEDED FOR CHEST PAIN. 50 tablet 3   rosuvastatin (CRESTOR) 40 MG tablet Take 1 tablet (40 mg total) by mouth daily. 90 tablet  0   fluticasone (FLONASE) 50 MCG/ACT nasal spray Place 2 sprays into both nostrils daily. (Patient not taking: Reported on 08/06/2022) 16 g 6    Results for orders placed or performed during the hospital encounter of 08/12/22 (from the past 48 hour(s))  SARS CORONAVIRUS 2 (TAT 6-24 HRS) Anterior Nasal Swab     Status: None   Collection Time: 08/12/22  2:12 PM   Specimen: Anterior Nasal Swab  Result Value Ref Range   SARS Coronavirus 2 NEGATIVE NEGATIVE    Comment: (NOTE) SARS-CoV-2 target nucleic acids are NOT DETECTED.  The SARS-CoV-2 RNA is generally detectable in upper and lower respiratory specimens during the acute phase of  infection. Negative results do not preclude SARS-CoV-2 infection, do not rule out co-infections with other pathogens, and should not be used as the sole basis for treatment or other patient management decisions. Negative results must be combined with clinical observations, patient history, and epidemiological information. The expected result is Negative.  Fact Sheet for Patients: HairSlick.no  Fact Sheet for Healthcare Providers: quierodirigir.com  This test is not yet approved or cleared by the [place] FDA and  has been authorized for detection and/or diagnosis of SARS-CoV-2 by FDA under an Emergency Use Authorization (EUA). This EUA will remain  in effect (meaning this test can be used) for the duration of the COVID-19 declaration under Se ction 564(b)(1) of the Act, 21 U.S.C. section 360bbb-3(b)(1), unless the authorization is terminated or revoked sooner.  Performed at [facility], [address], [place], [place] [address]   Surgical pcr screen     Status: None   Collection Time: 08/12/22  2:12 PM   Specimen: Nasal Mucosa; Nasal Swab  Result Value Ref Range   MRSA, PCR NEGATIVE NEGATIVE   Staphylococcus aureus NEGATIVE NEGATIVE    Comment: (NOTE) The Xpert SA Assay (FDA approved for NASAL specimens in patients 52 years of age and older), is one component of a comprehensive surveillance program. It is not intended to diagnose infection nor to guide or monitor treatment. Performed at [facility], [address], [place], [place] [address]   Type and screen     Status: None   Collection Time: 08/12/22  2:25 PM  Result Value Ref Range   ABO/RH(D) A POS    Antibody Screen NEG    Sample Expiration 08/26/2022,2359    Extend sample reason      NO TRANSFUSIONS OR PREGNANCY IN THE PAST 3 MONTHS Performed at [facility], [address], [place], [place] [address]   CBC     Status: None    Collection Time: 08/12/22  2:30 PM  Result Value Ref Range   WBC 8.9 4.0 - 10.5 K/uL   RBC 4.80 4.22 - 5.81 MIL/uL   Hemoglobin 13.4 13.0 - 17.0 g/dL   HCT 21.3 08.6 - 57.8 %   MCV 85.6 80.0 - 100.0 fL   MCH 27.9 26.0 - 34.0 pg   MCHC 32.6 30.0 - 36.0 g/dL   RDW 46.9 62.9 - 52.8 %   Platelets 294 150 - 400 K/uL   nRBC 0.0 0.0 - 0.2 %    Comment: Performed at [facility], [address], [place], [place] [address]  Comprehensive metabolic panel     Status: Abnormal   Collection Time: 08/12/22  2:30 PM  Result Value Ref Range   Sodium 137 135 - 145 mmol/L   Potassium 4.0 3.5 - 5.1 mmol/L  Chloride 107 98 - 111 mmol/L   CO2 22 22 - 32 mmol/L   Glucose, Bld 117 (H) 70 - 99 mg/dL    Comment: Glucose reference range applies only to samples taken after fasting for at least 8 hours.   BUN 17 8 - 23 mg/dL   Creatinine, Ser 1.61 (H) 0.61 - 1.24 mg/dL   Calcium 8.7 (L) 8.9 - 10.3 mg/dL   Total Protein 6.2 (L) 6.5 - 8.1 g/dL   Albumin 3.7 3.5 - 5.0 g/dL   AST 26 15 - 41 U/L   ALT 20 0 - 44 U/L   Alkaline Phosphatase 74 38 - 126 U/L   Total Bilirubin 0.6 0.3 - 1.2 mg/dL   GFR, Estimated 58 (L) >60 mL/min    Comment: (NOTE) Calculated using the CKD-EPI Creatinine Equation (2021)    Anion gap 8 5 - 15    Comment: Performed at [facility], [address] [place], [place] [address]  Protime-INR     Status: None   Collection Time: 08/12/22  2:30 PM  Result Value Ref Range   Prothrombin Time 14.5 11.4 - 15.2 seconds   INR 1.1 0.8 - 1.2    Comment: (NOTE) INR goal varies based on device and disease states. Performed at [facility], [address], [place], [place] [address]   APTT     Status: None   Collection Time: 08/12/22  2:30 PM  Result Value Ref Range   aPTT 31 24 - 36 seconds    Comment: Performed at [facility], [address] [place], [place] [address]  Urinalysis, Routine w reflex microscopic -Urine, Clean Catch     Status: Abnormal    Collection Time: 08/12/22  2:30 PM  Result Value Ref Range   Color, Urine STRAW (A) YELLOW   APPearance CLEAR CLEAR   Specific Gravity, Urine 1.006 1.005 - 1.030   pH 6.0 5.0 - 8.0   Glucose, UA NEGATIVE NEGATIVE mg/dL   Hgb urine dipstick NEGATIVE NEGATIVE   Bilirubin Urine NEGATIVE NEGATIVE   Ketones, ur NEGATIVE NEGATIVE mg/dL   Protein, ur NEGATIVE NEGATIVE mg/dL   Nitrite NEGATIVE NEGATIVE   Leukocytes,Ua NEGATIVE NEGATIVE    Comment: Performed at [facility], [address] [place], [place] [address]   DG Chest 2 View  Result Date: 08/13/2022 CLINICAL DATA:  Preop examination EXAM: CHEST - 2 VIEW COMPARISON:  10/08/2021 FINDINGS: Cardiac size is within normal limits. Thoracic aorta is tortuous. There are no signs of pulmonary edema or new focal infiltrates. There is no pleural effusion or pneumothorax. There is 5.3 x 3 cm smooth marginated radiopacity in the right paraspinal region close to the right apex corresponding to the lesion seen in the previous CT chest and MRI of thoracic spine. IMPRESSION: There are no signs of pulmonary edema or focal pulmonary consolidation. There is a pleural-based lesion seen in the medial right apex corresponding to the findings seen in previous CT and MR thoracic spine, possibly neural neoplasm. Electronically Signed   By: Ernie Avena M.D.   On: 08/13/2022 11:42    Review of Systems  Constitutional: Negative.   HENT: Negative.    Eyes: Negative.   Respiratory: Negative.    Cardiovascular: Negative.   Gastrointestinal: Negative.   Endocrine: Negative.   Genitourinary: Negative.   Musculoskeletal: Negative.   Allergic/Immunologic: Negative.   Neurological: Negative.   Hematological: Negative.   Psychiatric/Behavioral: Negative.      Blood  pressure 113/74, pulse 72, temperature 97.8 F (36.6 C), temperature source Oral, resp. rate 17, height 6\' 1"  (1.854 m), weight 96.2 kg, SpO2 96%. Physical Exam Constitutional:       Appearance: Normal appearance.  HENT:     Head: Normocephalic and atraumatic.     Right Ear: Tympanic membrane and external ear normal.     Left Ear: Tympanic membrane and external ear normal.     Nose: Nose normal.     Mouth/Throat:     Mouth: Mucous membranes are moist.     Pharynx: Oropharynx is clear.  Eyes:     Extraocular Movements: Extraocular movements intact.     Pupils: Pupils are equal, round, and reactive to light.  Cardiovascular:     Rate and Rhythm: Normal rate and regular rhythm.  Pulmonary:     Effort: Pulmonary effort is normal.     Breath sounds: Normal breath sounds.  Abdominal:     General: Abdomen is flat.     Palpations: Abdomen is soft.  Musculoskeletal:        General: Normal range of motion.     Cervical back: Normal range of motion and neck supple.  Skin:    General: Skin is warm and dry.  Neurological:     General: No focal deficit present.     Mental Status: He is alert and oriented to person, place, and time.     Sensory: No sensory deficit.     Motor: No weakness.     Coordination: Coordination normal.     Gait: Gait normal.     Deep Tendon Reflexes: Reflexes normal.  Psychiatric:        Mood and Affect: Mood normal.        Behavior: Behavior normal.        Thought Content: Thought content normal.        Judgment: Judgment normal.      Assessment/Plan OR resection of tumor via laminectomy and thoracotomy. Risks including bleeding, infection, inability to resect mass, paralysis, weakness, bowel and or bladder dysfunction, instability and other risks were discussed. He understands and wishes to proceed.  [name], MD 08/14/2022, 7:34 AM

## 2022-08-14 NOTE — Interval H&P Note (Signed)
History and Physical Interval Note:  08/14/2022 7:25 AM  [name]  has presented today for surgery, with the diagnosis of POSTERIOR MEDIASTINAL MASS.  The various methods of treatment have been discussed with the patient and family. After consideration of risks, benefits and other options for treatment, the patient has consented to  Procedure(s): XI ROBOTIC ASSISTED RESECTION OF MEDIASTINAL MASS (Right) THORACIC LAMINECTOMY FOR TUMOR (N/A) as a surgical intervention.  The patient's history has been reviewed, patient examined, no change in status, stable for surgery.  I have reviewed the patient's chart and labs.  Questions were answered to the patient's satisfaction.     [name] Keane Scrape

## 2022-08-14 NOTE — Anesthesia Procedure Notes (Signed)
Procedure Name: Intubation Date/Time: 08/14/2022 11:42 AM  Performed by: Rachel Moulds, CRNAPre-anesthesia Checklist: Patient identified, Emergency Drugs available, Suction available, Patient being monitored and Timeout performed Patient Re-evaluated:Patient Re-evaluated prior to induction Oxygen Delivery Method: Circle system utilized Preoxygenation: Pre-oxygenation with 100% oxygen Induction Type: Inhalational induction with existing ETT and IV induction Ventilation: Oral airway inserted - appropriate to patient size and Mask ventilation without difficulty Laryngoscope Size: Mac and 4 Grade View: Grade IV Tube type: Oral Endobronchial tube: Left, EBT position confirmed by fiberoptic bronchoscope and EBT position confirmed by auscultation and 41 Fr Number of attempts: 2 Airway Equipment and Method: Stylet Placement Confirmation: positive ETCO2 and breath sounds checked- equal and bilateral Secured at: 31 cm Tube secured with: Tape Dental Injury: Teeth and Oropharynx as per pre-operative assessment

## 2022-08-14 NOTE — Anesthesia Procedure Notes (Signed)
Procedure Name: Intubation Date/Time: 08/14/2022 8:00 AM  Performed by: Rachel Moulds, CRNAPre-anesthesia Checklist: Patient identified, Emergency Drugs available, Suction available and Patient being monitored Patient Re-evaluated:Patient Re-evaluated prior to induction Oxygen Delivery Method: Circle System Utilized Preoxygenation: Pre-oxygenation with 100% oxygen Induction Type: IV induction Ventilation: Mask ventilation without difficulty Laryngoscope Size: Mac and 4 Grade View: Grade I Tube type: Oral Tube size: 8.0 mm Number of attempts: 1 Airway Equipment and Method: Stylet and Oral airway Placement Confirmation: ETT inserted through vocal cords under direct vision, positive ETCO2 and breath sounds checked- equal and bilateral Secured at: 22 cm Tube secured with: Tape Dental Injury: Teeth and Oropharynx as per pre-operative assessment

## 2022-08-14 NOTE — Hospital Course (Addendum)
History of Present Illness:    [name] is a 72 y.o. male with a past medical history of HTN, CAD (s/p PCI in 2022), HLD, tobacco abuse and prostate cancer (s/p prostatectomy in 2023). 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   Dr. Cliffton Asters reviewed the patient's diagnostic studies a determined a combination procedure with neurosurgery Dr. Franky Macho would benefit this patient. He reviewed the treatment options as well as the risks and benefits of surgery. [name] was agreeable to proceed with surgery.  Hospital course:  [name] arrived at [facility] and was brought to the operating room on 08/14/22. He underwent a laminectomy by Dr. Franky Macho with neurosurgery then right RATS by Dr. Cliffton Asters for resection of the posterior mediastinal mass. He tolerated the procedure well, was extubated and transferred to the PACU in stable condition. Chest tube was to water seal. There was not a lot of output and there was no air leak. CXR this am shows a small right apical pneumothorax. Chest tube was clamped. Same day chest x ray showed a decreased right apical pneumothorax. He has been tolerating a diet. All wounds are clean, dry, and healing without signs of infection. He is ambulating on room air. Dr. Franky Macho changed dressing ane per patient, patient is able to shower and remove dressing on Sunday. He is stable for discharge today.

## 2022-08-14 NOTE — Progress Notes (Signed)
Attempted to get verbal order for consent from Dr. Franky Macho and Dr. Franky Macho refused, stating he would consent the patient when he arrived.  At this point we are unable to give the patient sedation and start the central line until they have signed consent.

## 2022-08-15 ENCOUNTER — Inpatient Hospital Stay (HOSPITAL_COMMUNITY): Payer: PPO

## 2022-08-15 ENCOUNTER — Other Ambulatory Visit: Payer: Self-pay

## 2022-08-15 ENCOUNTER — Encounter (HOSPITAL_COMMUNITY): Payer: Self-pay | Admitting: Thoracic Surgery (Cardiothoracic Vascular Surgery)

## 2022-08-15 LAB — BASIC METABOLIC PANEL
Anion gap: 9 (ref 5–15)
BUN: 20 mg/dL (ref 8–23)
CO2: 23 mmol/L (ref 22–32)
Calcium: 8.3 mg/dL — ABNORMAL LOW (ref 8.9–10.3)
Chloride: 102 mmol/L (ref 98–111)
Creatinine, Ser: 1.15 mg/dL (ref 0.61–1.24)
GFR, Estimated: >60 mL/min (ref >60)
Glucose, Bld: 151 mg/dL — ABNORMAL HIGH (ref 70–99)
Potassium: 4.6 mmol/L (ref 3.5–5.1)

## 2022-08-15 LAB — CBC
HCT: 34.6 % — ABNORMAL LOW (ref 39.0–52.0)
Hemoglobin: 11.2 g/dL — ABNORMAL LOW (ref 13.0–17.0)
MCH: 27.7 pg (ref 26.0–34.0)
MCHC: 32.4 g/dL (ref 30.0–36.0)
MCV: 85.6 fL (ref 80.0–100.0)
Platelets: 255 10*3/uL (ref 150–400)
RBC: 4.04 MIL/uL — ABNORMAL LOW (ref 4.22–5.81)
RDW: 13.7 % (ref 11.5–15.5)
WBC: 19.4 10*3/uL — ABNORMAL HIGH (ref 4.0–10.5)
nRBC: 0 % (ref 0.0–0.2)

## 2022-08-15 MED ORDER — ENOXAPARIN SODIUM 40 MG/0.4ML IJ SOSY
40.0000 mg | PREFILLED_SYRINGE | Freq: Every day | INTRAMUSCULAR | Status: DC
  Administered 2022-08-15 – 2022-08-16 (×2): 40 mg via SUBCUTANEOUS
  Filled 2022-08-15 (×2): qty 0.4

## 2022-08-15 MED ORDER — CYCLOBENZAPRINE HCL 10 MG PO TABS
5.0000 mg | ORAL_TABLET | Freq: Two times a day (BID) | ORAL | Status: DC | PRN
  Administered 2022-08-15: 5 mg via ORAL
  Filled 2022-08-15: qty 1

## 2022-08-15 NOTE — Progress Notes (Signed)
I clamped chest tube around 7:45 am. Patient instructed to call nurse if any breathing problems. Nurse notified chest tube clamped and will check CXR around 11:30 am.

## 2022-08-15 NOTE — Progress Notes (Addendum)
      [address]       [place] [address]             [phone]       1 Day Post-Op Procedure(s) (LRB): XI ROBOTIC ASSISTED RESECTION OF MEDIASTINAL MASS (Right) THORACIC LAMINECTOMY FOR TUMOR (N/A)  Subjective: Patient with pain across both shoulder blades. He is drinking his coffee this am.  Objective: Vital signs in last 24 hours: Temp:  [96 F (35.6 C)-98.9 F (37.2 C)] 98.9 F (37.2 C) (07/25 0400) Pulse Rate:  [65-75] 72 (07/25 0400) Cardiac Rhythm: Normal sinus rhythm (07/24 1900) Resp:  [11-18] 18 (07/25 0400) BP: (106-129)/(66-77) 126/67 (07/25 0400) SpO2:  [93 %-99 %] 93 % (07/25 0400) Arterial Line BP: (125-140)/(49-61) 132/50 (07/24 1615)    Intake/Output from previous day: 07/24 0701 - 07/25 0700 In: 3546 [P.O.:480; I.V.:2866; IV Piggyback:200] Out: 791 [Urine:260; Blood:400; Chest Tube:131]   Physical Exam:  Cardiovascular: RRR Pulmonary: Clear to auscultation bilaterally Abdomen: Soft, non tender, bowel sounds present. Extremities: No lower extremity edema. Wounds: Clean and dry.  No erythema or signs of infection. Chest Tube: to water seal, no air leak  Lab Results: CBC: Recent Labs    08/12/22 1430 08/15/22 0349  WBC 8.9 19.4*  HGB 13.4 11.2*  HCT 41.1 34.6*  PLT 294 255   BMET:  Recent Labs    08/12/22 1430 08/15/22 0349  NA 137 134*  K 4.0 4.6  CL 107 102  CO2 22 23  GLUCOSE 117* 151*  BUN 17 20  CREATININE 1.30* 1.15  CALCIUM 8.7* 8.3*    PT/INR:  Recent Labs    08/12/22 1430  LABPROT 14.5  INR 1.1   ABG:  INR: Will add last result for INR, ABG once components are confirmed Will add last 4 CBG results once components are confirmed  Assessment/Plan:  1. CV - SR. On Toprol XL 25 mg daily and Imdur 15 mg daily. Will restart Losartan likely at discharge. 2.  Pulmonary - On room air. Chest tube with 131 cc since surgery. Chest tube is to water seal, no air leak. CXR this am appears to show small  right apical pneumothorax and atelectasis at bases. Will clamp chest tube then remove chest tube if CXR stable. Encourage incentive spirometer. Await final pathology. 3. Expected post op blood loss anemia-H and ht his am decreased to 11.2 and 34.6 4. Decrease IVF  5. On Lovenox for DVT prophylaxis 6. At patient request, muscle relaxer and heating pad for shoulders. 7. Possibly discharge later today if chest tube removed  Donielle M ZimmermanPA-C 08/15/2022,6:57 AM  Agree with above Clamping trial Home, once CT out  Illyanna Petillo [employer]

## 2022-08-15 NOTE — TOC CM/SW Note (Signed)
Transition of Care [facility]) - Inpatient Brief Assessment   Patient Details  Name: DEADRICK STIDD MRN: [id number] Date of Birth: 1950/04/22  [facility]) CM/SW Contact:    Harriet Masson, RN Phone Number: 08/15/2022, 12:34 PM   Clinical Narrative:  Day Post-Op Procedure(s) (LRB): XI ROBOTIC ASSISTED RESECTION OF MEDIASTINAL MASS. Chest tube clamped.  TOC following.    Transition of Care Asessment: Insurance and Status: Insurance coverage has been reviewed Patient has primary care physician: Yes Home environment has been reviewed: Safe to discharge home when medically stable Prior level of function:: indpendent Prior/Current Home Services: No current home services Social Determinants of Health Reivew: SDOH reviewed no interventions necessary Readmission risk has been reviewed: Yes Transition of care needs: no transition of care needs at this time

## 2022-08-15 NOTE — Plan of Care (Signed)

## 2022-08-16 LAB — COMPREHENSIVE METABOLIC PANEL: CO2: 23 mmol/L (ref 22–32)

## 2022-08-16 MED ORDER — CYCLOBENZAPRINE HCL 5 MG PO TABS: 5.0000 mg | ORAL_TABLET | Freq: Two times a day (BID) | ORAL | 0 refills | Status: DC | PRN

## 2022-08-16 MED ORDER — TRAMADOL HCL 50 MG PO TABS: 50.0000 mg | ORAL_TABLET | Freq: Four times a day (QID) | ORAL | 0 refills | Status: DC | PRN

## 2022-08-16 NOTE — TOC Transition Note (Addendum)
Transition of Care [facility]) - CM/SW Discharge Note   Patient Details  Name: [name] MRN: [id number] Date of Birth: 01/24/1950  [facility]) CM/SW Contact:  Leone Haven, RN Phone Number: 08/16/2022, 12:00 PM   Clinical Narrative:    Patient is for dc, has no needs. Son to transport him home.         Patient Goals and CMS Choice      Discharge Placement                         Discharge Plan and Services Additional resources added to the After Visit Summary for                                       Social Determinants of Health (SDOH) Interventions SDOH Screenings   Food Insecurity: No Food Insecurity (08/15/2022)  Housing: Low Risk  (08/15/2022)  Transportation Needs: No Transportation Needs (08/15/2022)  Utilities: Not At Risk (08/15/2022)  Alcohol Screen: Low Risk  (06/18/2022)  Depression (PHQ2-9): Low Risk  (07/05/2022)  Financial Resource Strain: Low Risk  (06/18/2022)  Physical Activity: Unknown (06/18/2022)  Social Connections: Moderately Isolated (06/18/2022)  Stress: No Stress Concern Present (06/18/2022)  Tobacco Use: High Risk (08/14/2022)     Readmission Risk Interventions     No data to display

## 2022-08-16 NOTE — Plan of Care (Signed)

## 2022-08-16 NOTE — Progress Notes (Signed)
Mobility Specialist Progress Note:   08/16/22 1058  Mobility  Activity Ambulated independently in hallway  Level of Assistance Standby assist, set-up cues, supervision of patient - no hands on  Assistive Device None  Distance Ambulated (ft) 350 ft  Activity Response Tolerated well  Mobility Referral Yes  $Mobility charge 1 Mobility  Mobility Specialist Start Time (ACUTE ONLY) 0925  Mobility Specialist Stop Time (ACUTE ONLY) 0935  Mobility Specialist Time Calculation (min) (ACUTE ONLY) 10 min    During Mobility: 86 HR , 90% SpO2 Post Mobility: 73 HR , 95% SpO2  Pt received in bed, agreeable to mobility. Pt c/o L. Knee soreness during ambulation, otherwise asymptomatic throughout. Pt returned to bed with call bell in hand and all needs met.   Leory Plowman  Mobility Specialist Please contact via [employer] office at [phone]

## 2022-08-16 NOTE — Discharge Summary (Signed)
[address]       [place] [address]             [phone]    Physician Discharge Summary  Patient ID: [name] MRN: [id number] DOB/AGE: 74-12-1950 74 y.o.  Admit date: 08/14/2022 Discharge date: 08/16/2022  Admission Diagnoses:  Patient Active Problem List   Diagnosis Date Noted   Mediastinal mass 08/14/2022   Schwannoma 07/05/2022   Post-nasal drip 06/20/2022   Fatigue 06/20/2022   Prostate cancer (HCC) 12/28/2021   COPD (chronic obstructive pulmonary disease) (HCC) 01/30/2021   Daytime sleepiness 01/30/2021   Angina, class III (HCC) 10/04/2020   Abnormal cardiac CT angiography 10/04/2020   Coronary artery disease involving native coronary artery of native heart with angina pectoris (HCC)    Left hamstring injury 09/13/2019   Low back pain radiating to right leg 06/15/2019   Posterior tibial tendinitis, left 12/04/2018   Bilateral leg pain 05/21/2017   Degenerative arthritis of knee, bilateral 02/18/2017   Bilateral knee pain 07/18/2016   Right foot pain 04/16/2016   LLQ abdominal pain 06/10/2013   Melanoma in situ (HCC)    Shortness of breath 05/25/2012   Allergic rhinitis 05/25/2012   Diverticulosis    Former smoker    HLD (hyperlipidemia)    Allergy    ABDOMINAL PAIN, LEFT LOWER QUADRANT 10/23/2009   GERD 03/31/2007   DIVERTICULITIS, ACUTE 03/31/2007   SINUSITIS- ACUTE-NOS 02/18/2007   URI 02/18/2007   ERECTILE DYSFUNCTION 10/29/2006   TOBACCO ABUSE 10/29/2006   Hyperlipidemia with target LDL less than 70 10/01/2006   Essential hypertension 10/01/2006   DIVERTICULOSIS, COLON 10/01/2006     Discharge Diagnoses:  Patient Active Problem List   Diagnosis Date Noted   Mediastinal mass 08/14/2022   Schwannoma 07/05/2022   Post-nasal drip 06/20/2022   Fatigue 06/20/2022   Prostate cancer (HCC) 12/28/2021   COPD (chronic obstructive pulmonary disease) (HCC) 01/30/2021   Daytime sleepiness 01/30/2021   Angina, class III (HCC)  10/04/2020   Abnormal cardiac CT angiography 10/04/2020   Coronary artery disease involving native coronary artery of native heart with angina pectoris (HCC)    Left hamstring injury 09/13/2019   Low back pain radiating to right leg 06/15/2019   Posterior tibial tendinitis, left 12/04/2018   Bilateral leg pain 05/21/2017   Degenerative arthritis of knee, bilateral 02/18/2017   Bilateral knee pain 07/18/2016   Right foot pain 04/16/2016   LLQ abdominal pain 06/10/2013   Melanoma in situ (HCC)    Shortness of breath 05/25/2012   Allergic rhinitis 05/25/2012   Diverticulosis    Former smoker    HLD (hyperlipidemia)    Allergy    ABDOMINAL PAIN, LEFT LOWER QUADRANT 10/23/2009   GERD 03/31/2007   DIVERTICULITIS, ACUTE 03/31/2007   SINUSITIS- ACUTE-NOS 02/18/2007   URI 02/18/2007   ERECTILE DYSFUNCTION 10/29/2006   TOBACCO ABUSE 10/29/2006   Hyperlipidemia with target LDL less than 70 10/01/2006   Essential hypertension 10/01/2006   DIVERTICULOSIS, COLON 10/01/2006     Discharged Condition: Stable  History of Present Illness:    [name] is a 72 y.o. male with a past medical history of HTN, CAD (s/p PCI in 2022), HLD, tobacco abuse and prostate cancer (s/p prostatectomy in 2023). 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   Dr. Cliffton Asters reviewed the patient's diagnostic studies a determined a combination procedure  with neurosurgery Dr. Franky Macho would benefit this patient. He reviewed the treatment options as well as the risks and benefits of surgery. [name] was agreeable to proceed with surgery.  Hospital course:  [name] arrived at [facility] and was brought to the operating room on 08/14/22. He underwent a laminectomy by Dr. Franky Macho with neurosurgery then right RATS by Dr. Cliffton Asters for resection of the posterior mediastinal mass. He tolerated the procedure  well, was extubated and transferred to the PACU in stable condition. Chest tube was to water seal. There was not a lot of output and there was no air leak. CXR this am shows a small right apical pneumothorax. Chest tube was clamped. Same day chest x ray showed a decreased right apical pneumothorax. He has been tolerating a diet. All wounds are clean, dry, and healing without signs of infection. He is ambulating on room air. Dr. Franky Macho changed dressing ane per patient, patient is able to shower and remove dressing on Sunday. He is stable for discharge today.  Consults: None  Significant Diagnostic Studies:  CLINICAL DATA:  Pneumothorax   EXAM: PORTABLE CHEST 1 VIEW   COMPARISON:  Chest radiograph 1 day prior   FINDINGS: The right apical chest tube is stable.   The cardiomediastinal silhouette is stable, allowing for differences in patient rotation.   Probable subsegmental atelectasis in the left base is unchanged. There is no new or worsening focal airspace disease. There is no pleural effusion. There is a possible trace residual right apical pneumothorax, decreased in size and conspicuity compared to the prior study. There is no appreciable left pneumothorax.   IMPRESSION: Decreased right apical pneumothorax with possible trace residual.     Electronically Signed   By: Lesia Hausen M.D.   On: 08/15/2022 15:48   Narrative & Impression  CLINICAL DATA:  Recent removal of right paraspinal mass, postoperative state   EXAM: PORTABLE CHEST 1 VIEW   COMPARISON:  08/14/2022   FINDINGS: Transverse diameter of heart is increased. Thoracic aorta is tortuous. There are no signs of alveolar pulmonary edema. Small linear densities are seen in left lower lung field. There is minimal blunting of left lateral CP angle. Right chest tube is noted with its tip in the medial right apex. There is interval appearance of small right apical pneumothorax. Skin staples are noted  at cervicothoracic junction.   IMPRESSION: Interval appearance of small right apical pneumothorax. Right chest tube is noted in place.   Small linear densities in left lower lung field suggest subsegmental atelectasis.     Electronically Signed   By: Ernie Avena M.D.   On: 08/15/2022 10:31   Treatments: surgery:   Robotic assisted right video thoracoscopy - Resection of posterior mediastinal mass - Intercostal nerve block by Dr. Cliffton Asters 08/14/2022.  Pathology: Final result pending  Discharge Exam: Blood pressure 132/72, pulse 73, temperature 98 F (36.7 C), temperature source Oral, resp. rate 16, height 6' (1.829 m), weight 96.2 kg, SpO2 94%.  Cardiovascular: RRR Pulmonary: Clear to auscultation bilaterally Abdomen: Soft, non tender, bowel sounds present. Extremities: No lower extremity edema. Wounds: Clean and dry.  No erythema or signs of infection.   Discharge Medications:     Follow-up Information     Corliss Skains, MD. Go on 08/30/2022.   Specialty: Cardiothoracic Surgery Why: Appointment time is at 10:50 am Contact information: [address] [place] [address] [phone]         [name], MD. Call.  Specialty: Neurosurgery Why: for a follow up appointment for one week Contact information: [address] [place] [address] [phone]                 Signed:  Ardelle Balls, Cordelia Poche 08/16/2022, 11:02 AM

## 2022-08-16 NOTE — Progress Notes (Signed)
      [address]       [place] [address]             [phone]       2 Days Post-Op Procedure(s) (LRB): XI ROBOTIC ASSISTED RESECTION OF MEDIASTINAL MASS (Right) THORACIC LAMINECTOMY FOR TUMOR (N/A)  Subjective: Patient   Objective: Vital signs in last 24 hours: Temp:  [97.6 F (36.4 C)-98.3 F (36.8 C)] 98 F (36.7 C) (07/26 0500) Pulse Rate:  [62-80] 77 (07/26 0500) Cardiac Rhythm: Normal sinus rhythm (07/25 1944) Resp:  [12-15] 13 (07/26 0500) BP: (110-138)/(56-75) 138/75 (07/26 0500) SpO2:  [91 %-94 %] 94 % (07/26 0500)    Intake/Output from previous day: 07/25 0701 - 07/26 0700 In: 766.2 [P.O.:241; I.V.:525.2] Out: -    Physical Exam:  Cardiovascular: RRR Pulmonary: Clear to auscultation bilaterally Abdomen: Soft, non tender, bowel sounds present. Extremities: No lower extremity edema. Wounds: Clean and dry.  No erythema or signs of infection.   Lab Results: CBC: Recent Labs    08/15/22 0349 08/16/22 0138  WBC 19.4* 25.0*  HGB 11.2* 10.8*  HCT 34.6* 32.6*  PLT 255 225   BMET:  Recent Labs    08/15/22 0349 08/16/22 0138  NA 134* 134*  K 4.6 4.5  CL 102 103  CO2 23 23  GLUCOSE 151* 148*  BUN 20 22  CREATININE 1.15 1.33*  CALCIUM 8.3* 8.2*    PT/INR:  No results for input(s): "LABPROT", "INR" in the last 72 hours.  ABG:  INR: Will add last result for INR, ABG once components are confirmed Will add last 4 CBG results once components are confirmed  Assessment/Plan:  1. CV - SR. On Toprol XL 25 mg daily and Imdur 15 mg daily. Will restart Losartan at discharge. 2.  Pulmonary - On room air. CXR done after chest tube clamping, showed decreased right apical pneumothorax. Encourage incentive spirometer. Await final pathology. 3. Expected post op blood loss anemia-H and H this am slightly decreased to 10.8 and 32.6 4. On Lovenox for DVT prophylaxis 5. Creatinine slightly elevated to 1.33. 6. Discharge once Dr. Franky Macho  sees patient  Lelon Huh [facility] 08/16/2022,7:05 AM

## 2022-08-16 NOTE — Anesthesia Postprocedure Evaluation (Signed)
Anesthesia Post Note  Patient: [name]  Procedure(s) Performed: XI ROBOTIC ASSISTED RESECTION OF MEDIASTINAL MASS (Right: Chest) THORACIC LAMINECTOMY FOR TUMOR     Patient location during evaluation: PACU Anesthesia Type: General Level of consciousness: awake and alert Pain management: pain level controlled Vital Signs Assessment: post-procedure vital signs reviewed and stable Respiratory status: spontaneous breathing, nonlabored ventilation, respiratory function stable and patient connected to nasal cannula oxygen Cardiovascular status: blood pressure returned to baseline and stable Postop Assessment: no apparent nausea or vomiting Anesthetic complications: no   No notable events documented.  Last Vitals:  Vitals:   08/16/22 0500 08/16/22 0748  BP: 138/75 132/72  Pulse: 77 73  Resp: 13 16  Temp: 36.7 C 36.7 C  SpO2: 94% 94%    Last Pain:  Vitals:   08/16/22 0748  TempSrc: Oral  PainSc:                  Gwendy Boeder

## 2022-08-16 NOTE — Progress Notes (Signed)
Discharge instructions reviewed with pt and his son, both verbalized understanding of instructions.   IV removed and telemetry removed.  Copy of instructions given to pt. Pt informed scripts were sent to his pharmacy for pick up. Pt taking IS and "lung pillow" home with him to use as instructed.  Pt to be d/c'd via wheelchair with belongings, with son.             To be escorted by staff.   Annice Needy, RN SWOT

## 2022-08-16 NOTE — Progress Notes (Signed)
Patient ID: [name], male   DOB: 12/14/1950, 72 y.o.   MRN: [id number] BP 132/72 (BP Location: Right Arm)   Pulse 73   Temp 98 F (36.7 C) (Oral)   Resp 16   Ht 6' (1.829 m)   Wt 96.2 kg   SpO2 94%   BMI 28.75 kg/m  Alert and oriented x 4, moving all extremities well Wound is clean, dry, no signs of infection Will be discharged home, chest tube removed.  Path not yet available

## 2022-08-16 NOTE — Care Management Important Message (Signed)
Important Message  Patient Details  Name: [name] MRN: [id number] Date of Birth: 1950-06-10   Medicare Important Message Given:  Yes     [name] Stefan Church 08/16/2022, 12:52 PM

## 2022-08-19 ENCOUNTER — Ambulatory Visit: Payer: Self-pay

## 2022-08-19 ENCOUNTER — Telehealth: Payer: Self-pay

## 2022-08-19 NOTE — Chronic Care Management (AMB) (Signed)
   08/19/2022  [name] [birth year] [id number]   Reason for Encounter: Patient is not currently enrolled in the CCM program. CCM enrollment status changed to Previously enrolled.   [facility]/Chronic Care Management [phone]

## 2022-08-19 NOTE — Transitions of Care (Post Inpatient/ED Visit) (Signed)
08/19/2022  Name: [name] MRN: [id number] DOB: May 22, 1950  Today's TOC FU Call Status: Today's TOC FU Call Status:: Successful TOC FU Call Competed TOC FU Call Complete Date: 08/19/22  Transition Care Management Follow-up Telephone Call Date of Discharge: 08/16/22 Discharge Facility: [facility]) Type of Discharge: Inpatient Admission Primary Inpatient Discharge Diagnosis:: Mediastinal Mass, Schwannoma How have you been since you were released from the hospital?: Better Any questions or concerns?: No  Items Reviewed: Did you receive and understand the discharge instructions provided?: Yes Medications obtained,verified, and reconciled?: Partial Review Completed Reason for Partial Mediation Review: Reviewed new discharge medications Any new allergies since your discharge?: No Dietary orders reviewed?: No Do you have support at home?: Yes People in Home: child(ren), adult Name of Support/Comfort Primary Source: Arlys John  Medications Reviewed Today: Medications Reviewed Today     Reviewed by Jodelle Gross, RN (Case Manager) on 08/19/22 at 1337  Med List Status: <None>   Medication Order Taking? Sig Documenting Provider Last Dose Status Informant  cetirizine (ZYRTEC) 10 MG tablet [id number] No TAKE 1 TABLET BY MOUTH EVERY DAY [name], MD Unknown Active Self           Med Note Electa Sniff, [facility]   Mon Aug 19, 2022  1:37 PM) [facility] Nurse only reviewed new meds upon d/c  cyclobenzaprine (FLEXERIL) 5 MG tablet [id number] Yes Take 1 tablet (5 mg total) by mouth 2 (two) times daily as needed for muscle spasms. Ardelle Balls, PA-C Taking Active   ezetimibe (ZETIA) 10 MG tablet [id number]  TAKE 1 TABLET BY MOUTH EVERY DAY [name], MD  Active Self  fluticasone (FLONASE) 50 MCG/ACT nasal spray [id number]  Place 2 sprays into both nostrils daily.  Patient not taking: Reported on 08/06/2022   Park Meo, FNP  Active Self  isosorbide mononitrate (IMDUR) 30 MG  24 hr tablet [id number]  Take 0.5 tablets (15 mg total) by mouth daily. [name], MD  Active Self  losartan (COZAAR) 50 MG tablet [id number]  Take 0.5 tablets (25 mg total) by mouth in the morning. [name], MD  Active Self  metoprolol succinate (TOPROL-XL) 25 MG 24 hr tablet [id number]  TAKE 1 TABLET (25 MG TOTAL) BY MOUTH DAILY. [name], MD  Active Self  Multiple Vitamins-Minerals (CENTRUM SILVER 50+MEN) TABS [id number]  Take 1 tablet by mouth daily. [provider]  Active Self  nitroGLYCERIN (NITROSTAT) 0.4 MG SL tablet [id number]  PLACE 1 TABLET UNDER THE TONGUE EVERY 5 MINUTES AS NEEDED FOR CHEST PAIN. [name], MD  Active Self  rosuvastatin (CRESTOR) 40 MG tablet [id number]  Take 1 tablet (40 mg total) by mouth daily. [name], MD  Active Self  traMADol Janean Sark) 50 MG tablet [id number] Yes Take 1 tablet (50 mg total) by mouth every 6 (six) hours as needed (mild pain). Ardelle Balls, PA-C Taking Active             Home Care and Equipment/Supplies: Were Home Health Services Ordered?: No Any new equipment or medical supplies ordered?: No  Functional Questionnaire: Do you need assistance with bathing/showering or dressing?: Yes Do you need assistance with meal preparation?: No Do you need assistance with eating?: No Do you have difficulty maintaining continence: No Do you need assistance with getting out of bed/getting out of a chair/moving?: No Do you have difficulty managing or taking your medications?: No  Follow up appointments reviewed: PCP Follow-up appointment confirmed?: NA Specialist Hospital Follow-up appointment confirmed?:  Yes Date of Specialist follow-up appointment?: 08/29/22 Follow-Up Specialty Provider:: Dr. Cliffton Asters Do you need transportation to your follow-up appointment?: No Do you understand care options if your condition(s) worsen?: Yes-patient verbalized understanding  SDOH Interventions Today     Flowsheet Row Most Recent Value  SDOH Interventions   Food Insecurity Interventions Intervention Not Indicated  Housing Interventions Intervention Not Indicated  Transportation Interventions Intervention Not Indicated  Utilities Interventions Intervention Not Indicated      Jodelle Gross, RN, BSN, CCM Care Management Coordinator [facility] Phone: [phone]/Fax: [phone]

## 2022-08-26 DIAGNOSIS — C637 Malignant neoplasm of other specified male genital organs: Secondary | ICD-10-CM | POA: Diagnosis not present

## 2022-08-28 DIAGNOSIS — K5730 Diverticulosis of large intestine without perforation or abscess without bleeding: Secondary | ICD-10-CM | POA: Diagnosis not present

## 2022-08-28 DIAGNOSIS — N281 Cyst of kidney, acquired: Secondary | ICD-10-CM | POA: Diagnosis not present

## 2022-08-28 DIAGNOSIS — C495 Malignant neoplasm of connective and soft tissue of pelvis: Secondary | ICD-10-CM | POA: Diagnosis not present

## 2022-08-29 ENCOUNTER — Other Ambulatory Visit: Payer: Self-pay | Admitting: Thoracic Surgery (Cardiothoracic Vascular Surgery)

## 2022-08-29 DIAGNOSIS — J9859 Other diseases of mediastinum, not elsewhere classified: Secondary | ICD-10-CM

## 2022-08-29 NOTE — Progress Notes (Signed)
      [address]       [place] [address]             [phone]        WILFRED CLEMMER [facility] Medical Record #[id number] Date of Birth: [birth year]  Referring: [name], MD Primary Care: [name], MD Primary Cardiologist:Brian Agbor-Etang, MD  Reason for visit:   follow-up  History of Present Illness:     72yo male presents for his 1st follow-up.  He has no complaints today.  He is ready to get his staples removed.  Physical Exam: BP 119/70 (BP Location: Left Arm, Patient Position: Sitting, Cuff Size: Normal)   Pulse 88   Resp 20   Ht 6' (1.829 m)   Wt 214 lb 6.4 oz (97.3 kg)   SpO2 95% Comment: RA  BMI 29.08 kg/m   Alert NAD Incision clean.   Abdomen, ND no peripheral edema   Diagnostic Studies & Laboratory data:  Path: A. EXTRADURAL MASS, T2, RESECTION:  Schwannoma with degenerative changes (ancient change).   B. EXTRADURAL MASS, THORACIC, RESECTION:  Schwannoma with degenerative changes (ancient change).     Assessment / Plan:   72yo male s/p resection of T2 schwannoma.  Overall doing well.  He will follow-up in 1 month with a CXR.     Corliss Skains 08/30/2022 12:36 PM

## 2022-08-30 ENCOUNTER — Encounter: Payer: Self-pay | Admitting: Thoracic Surgery (Cardiothoracic Vascular Surgery)

## 2022-08-30 ENCOUNTER — Ambulatory Visit (INDEPENDENT_AMBULATORY_CARE_PROVIDER_SITE_OTHER): Payer: Self-pay | Admitting: Thoracic Surgery (Cardiothoracic Vascular Surgery)

## 2022-08-30 ENCOUNTER — Ambulatory Visit
Admission: RE | Admit: 2022-08-30 | Discharge: 2022-08-30 | Disposition: A | Discharge status: Home / Self Care | Payer: PPO | Source: Ambulatory Visit | Attending: Thoracic Surgery (Cardiothoracic Vascular Surgery) | Admitting: Thoracic Surgery (Cardiothoracic Vascular Surgery)

## 2022-08-30 VITALS — BP 119/70 | HR 88 | Resp 20 | Ht 72.0 in | Wt 214.4 lb

## 2022-08-30 DIAGNOSIS — Z9889 Other specified postprocedural states: Secondary | ICD-10-CM | POA: Diagnosis not present

## 2022-08-30 DIAGNOSIS — J9859 Other diseases of mediastinum, not elsewhere classified: Secondary | ICD-10-CM

## 2022-08-30 DIAGNOSIS — D3610 Benign neoplasm of peripheral nerves and autonomic nervous system, unspecified: Secondary | ICD-10-CM

## 2022-09-02 DIAGNOSIS — N5231 Erectile dysfunction following radical prostatectomy: Secondary | ICD-10-CM | POA: Diagnosis not present

## 2022-09-02 DIAGNOSIS — N393 Stress incontinence (female) (male): Secondary | ICD-10-CM | POA: Diagnosis not present

## 2022-09-02 DIAGNOSIS — C495 Malignant neoplasm of connective and soft tissue of pelvis: Secondary | ICD-10-CM | POA: Diagnosis not present

## 2022-09-05 ENCOUNTER — Encounter: Payer: Self-pay | Admitting: Internal Medicine

## 2022-09-05 ENCOUNTER — Ambulatory Visit: Payer: PPO | Admitting: Internal Medicine

## 2022-09-05 VITALS — BP 122/68 | HR 78 | Temp 97.8°F | Ht 72.0 in | Wt 215.0 lb

## 2022-09-05 DIAGNOSIS — J449 Chronic obstructive pulmonary disease, unspecified: Secondary | ICD-10-CM | POA: Diagnosis not present

## 2022-09-05 DIAGNOSIS — Z87891 Personal history of nicotine dependence: Secondary | ICD-10-CM

## 2022-09-05 DIAGNOSIS — G4733 Obstructive sleep apnea (adult) (pediatric): Secondary | ICD-10-CM
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
## 2022-09-05 NOTE — Patient Instructions (Signed)
Continue weight loss journey  Referral to lung cancer screening program  Avoid secondhand smoke Avoid SICK contacts Recommend  Masking  when appropriate Recommend Keep up-to-date with vaccinations

## 2022-09-06 ENCOUNTER — Ambulatory Visit (INDEPENDENT_AMBULATORY_CARE_PROVIDER_SITE_OTHER): Payer: PPO | Admitting: Family Medicine

## 2022-09-06 ENCOUNTER — Encounter: Payer: Self-pay | Admitting: Family Medicine

## 2022-09-06 VITALS — BP 120/62 | HR 74 | Temp 98.4°F | Ht 72.0 in | Wt 214.0 lb

## 2022-09-06 DIAGNOSIS — R079 Chest pain, unspecified: Secondary | ICD-10-CM

## 2022-09-06 MED ORDER — NITROGLYCERIN 0.4 MG SL SUBL: 0.4000 mg | SUBLINGUAL_TABLET | SUBLINGUAL | 3 refills | Status: DC | PRN

## 2022-09-06 MED ORDER — ISOSORBIDE MONONITRATE ER 30 MG PO TB24: 30.0000 mg | ORAL_TABLET | Freq: Every day | ORAL | 3 refills | Status: DC

## 2022-09-06 NOTE — Progress Notes (Addendum)
Subjective:    Patient ID: [name], male    DOB: [birth year], 72 y.o.   MRN: [id number] Patient presents today with atypical chest pain.  Patient recently was in the hospital to have a schwannoma removed from his spinal cord.  Since the hospital, patient has been having chest pain.  The pain is located in the center of his chest.  It occurs at random times.  It lasts 10 to 15 minutes.  It then resolved spontaneously.  There is no association with exercise or physical activity.  Often will happen while he sitting at his computer.  He denies any cough.  He denies any pleurisy.  He denies any hemoptysis.  There is no pitting edema in his extremities to suggest a DVT.  He denies any fevers or chills to suggest pneumonia.  The patient is pain-free today.  Patient had a catheterization in 2002 that did show a 75% lesion in his right coronary artery.  He is currently on Imdur 15 mg a day.  He had a patent stent in his LAD and a 45% blockage in the distal LAD.  Postoperative course was complicated by a small pneumothorax on the right lung.  However this resolved spontaneously.  Today on exam he has equal and symmetric breath sounds bilaterally with good air movement. Past Medical History:  Diagnosis Date   Adenomatous colon polyp 2004   Allergy    Anginal pain (HCC)    Arthritis    Diverticulosis 2011   Dyspnea    Former smoker    GERD (gastroesophageal reflux disease)    HLD (hyperlipidemia)    Hyperplastic colon polyp 2004   Hypertension    Melanoma in situ [facility])    Past Surgical History:  Procedure Laterality Date   COLONOSCOPY  01/21/2009   SEVERAL    CORONARY STENT INTERVENTION N/A 10/04/2020   Procedure: CORONARY STENT INTERVENTION;  Surgeon: Marykay Lex, MD;  Location: [facility] INVASIVE CV LAB;  Service: Cardiovascular;  Laterality: N/A;   LAMINECTOMY N/A 08/14/2022   Procedure: THORACIC LAMINECTOMY FOR TUMOR;  Surgeon: [name], MD;  Location: [facility] OR;  Service: Neurosurgery;   Laterality: N/A;   LEFT HEART CATH AND CORONARY ANGIOGRAPHY N/A 10/04/2020   Procedure: LEFT HEART CATH AND CORONARY ANGIOGRAPHY;  Surgeon: Marykay Lex, MD;  Location: [facility] INVASIVE CV LAB;  Service: Cardiovascular;  Laterality: N/A;   LEFT HEART CATH AND CORONARY ANGIOGRAPHY Left 11/30/2020   Procedure: LEFT HEART CATH AND CORONARY ANGIOGRAPHY;  Surgeon: Marykay Lex, MD;  Location: [facility] INVASIVE CV LAB;  Service: Cardiovascular;  Laterality: Left;   LYMPH NODE DISSECTION Bilateral 12/28/2021   Procedure: PELVIC LYMPH NODE DISSECTION;  Surgeon: [name], MD;  Location: [facility] ORS;  Service: Urology;  Laterality: Bilateral;   ROBOT ASSISTED LAPAROSCOPIC RADICAL PROSTATECTOMY N/A 12/28/2021   Procedure: XI ROBOTIC ASSISTED LAPAROSCOPIC RADICAL PROSTATECTOMY AND INDOCYANINE GREEN DYE INJECTION;  Surgeon: [name], MD;  Location: [facility] ORS;  Service: Urology;  Laterality: N/A;  3 HRS   TOOTH EXTRACTION     TOTAL KNEE ARTHROPLASTY Right 04/26/2020   Current Outpatient Medications on File Prior to Visit  Medication Sig Dispense Refill   cetirizine (ZYRTEC) 10 MG tablet TAKE 1 TABLET BY MOUTH EVERY DAY 90 tablet 0   ezetimibe (ZETIA) 10 MG tablet TAKE 1 TABLET BY MOUTH EVERY DAY 90 tablet 3   losartan (COZAAR) 50 MG tablet Take 0.5 tablets (25 mg total) by mouth in the morning. 45 tablet 1  metoprolol succinate (TOPROL-XL) 25 MG 24 hr tablet TAKE 1 TABLET (25 MG TOTAL) BY MOUTH DAILY. 90 tablet 3   Multiple Vitamins-Minerals (CENTRUM SILVER 50+MEN) TABS Take 1 tablet by mouth daily.     rosuvastatin (CRESTOR) 40 MG tablet Take 1 tablet (40 mg total) by mouth daily. 90 tablet 0   No current facility-administered medications on file prior to visit.   Allergies  Allergen Reactions   Levaquin [Levofloxacin In D5w] Shortness Of Breath   Social History   Socioeconomic History   Marital status: Married    Spouse name: [name]   Number of children: 2   Years of education: Not on file    Highest education level: 12th grade  Occupational History   Occupation: part Investment banker, corporate: [employer]  Tobacco Use   Smoking status: Former    Current packs/day: 0.00    Average packs/day: 1 pack/day for 30.0 years (30.0 ttl pk-yrs)    Types: Cigarettes, Cigars    Start date: 05/12/1968    Quit date: 05/13/1998    Years since quitting: 24.3   Smokeless tobacco: Current    Types: Chew  Vaping Use   Vaping status: Never Used  Substance and Sexual Activity   Alcohol use: Yes    Alcohol/week: 2.0 standard drinks of alcohol    Types: 2 Cans of beer per week   Drug use: No   Sexual activity: Not Currently  Other Topics Concern   Not on file  Social History Narrative   Married x 51 years   2 grandsons   Social Determinants of Health   Financial Resource Strain: Low Risk  (06/18/2022)   Overall Financial Resource Strain (CARDIA)    Difficulty of Paying Living Expenses: Not very hard  Food Insecurity: No Food Insecurity (08/19/2022)   Hunger Vital Sign    Worried About Running Out of Food in the Last Year: Never true    Ran Out of Food in the Last Year: Never true  Transportation Needs: No Transportation Needs (08/19/2022)   PRAPARE - Administrator, Civil Service (Medical): No    Lack of Transportation (Non-Medical): No  Physical Activity: Unknown (06/18/2022)   Exercise Vital Sign    Days of Exercise per Week: 0 days    Minutes of Exercise per Session: Not on file  Stress: No Stress Concern Present (06/18/2022)   [employer] of Occupational Health - Occupational Stress Questionnaire    Feeling of Stress : Only a little  Social Connections: Moderately Isolated (06/18/2022)   Social Connection and Isolation Panel [NHANES]    Frequency of Communication with Friends and Family: More than three times a week    Frequency of Social Gatherings with Friends and Family: Once a week    Attends Religious Services: More than 4 times per year    Active Member of [employer] or  Organizations: No    Attends Banker Meetings: Never    Marital Status: Widowed  Intimate Partner Violence: Not At Risk (08/15/2022)   Humiliation, Afraid, Rape, and Kick questionnaire    Fear of Current or Ex-Partner: No    Emotionally Abused: No    Physically Abused: No    Sexually Abused: No      Review of Systems  All other systems reviewed and are negative.      Objective:   Physical Exam Vitals reviewed.  Constitutional:      General: He is not in acute distress.  Appearance: He is well-developed. He is not diaphoretic.  HENT:     Mouth/Throat:     Pharynx: No oropharyngeal exudate.  Eyes:     Conjunctiva/sclera: Conjunctivae normal.     Pupils: Pupils are equal, round, and reactive to light.  Cardiovascular:     Rate and Rhythm: Normal rate and regular rhythm.     Heart sounds: Normal heart sounds.  Pulmonary:     Effort: Pulmonary effort is normal. No respiratory distress.     Breath sounds: Normal breath sounds. No wheezing or rales.  Musculoskeletal:     Cervical back: Neck supple.  Lymphadenopathy:     Cervical: No cervical adenopathy.  Skin:    Findings: No erythema or rash.           Assessment & Plan:  Chest pain, unspecified type - Plan: EKG 12-Lead, CBC with Differential/Platelet, COMPLETE METABOLIC PANEL WITH GFR Chest pain is very atypical.  I do not believe that this represents a pulmonary embolism given the lack of pleurisy and shortness of breath.  I do not believe it represents a postoperative pneumonia as his breath sounds are clear and he has no cough or fever.  His physical exam does not suggest a pneumothorax.  Symptoms do not sound gastrointestinal.  Therefore the question is whether this is cardiac versus muscular.  I am concerned about the 75% stenosis seen in the right coronary artery.  I recommended that he increase Imdur to 30 mg a day and we consult his cardiologist about possible stress test to evaluate for any  evidence of reversible ischemia. EKG shows NSR with normal intervals, normal axis and no evidence of ischemia.

## 2022-09-07 LAB — COMPLETE METABOLIC PANEL WITH GFR
AG Ratio: 2 (calc) (ref 1.0–2.5)
ALT: 14 U/L (ref 9–46)
AST: 14 U/L (ref 10–35)
Albumin: 4.2 g/dL (ref 3.6–5.1)
Alkaline phosphatase (APISO): 84 U/L (ref 35–144)
BUN/Creatinine Ratio: 15 (calc) (ref 6–22)
BUN: 19 mg/dL (ref 7–25)
CO2: 26 mmol/L (ref 20–32)
Calcium: 9.6 mg/dL (ref 8.6–10.3)
Chloride: 105 mmol/L (ref 98–110)
Creat: 1.31 mg/dL — ABNORMAL HIGH (ref 0.70–1.28)
Globulin: 2.1 g/dL (ref 1.9–3.7)
Glucose, Bld: 95 mg/dL (ref 65–99)
Potassium: 5 mmol/L (ref 3.5–5.3)
Sodium: 139 mmol/L (ref 135–146)
Total Bilirubin: 0.5 mg/dL (ref 0.2–1.2)
Total Protein: 6.3 g/dL (ref 6.1–8.1)
eGFR: 57 mL/min/{1.73_m2} — ABNORMAL LOW (ref >=60)

## 2022-09-07 LAB — CBC WITH DIFFERENTIAL/PLATELET
Absolute Monocytes: 1248 {cells}/uL — ABNORMAL HIGH (ref 200–950)
Basophils Absolute: 104 {cells}/uL (ref 0–200)
Basophils Relative: 0.8 %
Eosinophils Absolute: 858 {cells}/uL — ABNORMAL HIGH (ref 15–500)
Eosinophils Relative: 6.6 %
HCT: 38.5 % (ref 38.5–50.0)
Hemoglobin: 12.6 g/dL — ABNORMAL LOW (ref 13.2–17.1)
Lymphs Abs: 2977 {cells}/uL (ref 850–3900)
MCH: 28.2 pg (ref 27.0–33.0)
MCHC: 32.7 g/dL (ref 32.0–36.0)
MCV: 86.1 fL (ref 80.0–100.0)
MPV: 10.8 fL (ref 7.5–12.5)
Monocytes Relative: 9.6 %
Neutro Abs: 7813 {cells}/uL — ABNORMAL HIGH (ref 1500–7800)
Neutrophils Relative %: 60.1 %
Platelets: 366 10*3/uL (ref 140–400)
RBC: 4.47 10*6/uL (ref 4.20–5.80)
RDW: 13.2 % (ref 11.0–15.0)
Total Lymphocyte: 22.9 %
WBC: 13 10*3/uL — ABNORMAL HIGH (ref 3.8–10.8)

## 2022-09-09 ENCOUNTER — Other Ambulatory Visit: Payer: Self-pay

## 2022-09-09 DIAGNOSIS — R079 Chest pain, unspecified: Secondary | ICD-10-CM

## 2022-09-10 ENCOUNTER — Other Ambulatory Visit: Payer: Self-pay

## 2022-09-10 DIAGNOSIS — R079 Chest pain, unspecified: Secondary | ICD-10-CM

## 2022-09-10 NOTE — Addendum Note (Signed)
Addended by: Arta Silence on: 09/10/2022 04:59 PM   Modules accepted: Orders

## 2022-09-20 NOTE — Progress Notes (Unsigned)
     [address]       [place] [address]             [phone]   HPI: This is a 72 year old who is s/p robotic assisted right video thoracoscopy, resection of posterior mediastinal mass, and intercostal nerve block by Dr. Cliffton Asters on 08/14/2022. Dr. Franky Macho also did a right thoracic laminectomy,  for tumor resection with microdissection. Pathology showed Schwannoma with degenerative changes. He was seen by Dr. Cliffton Asters on 08/30/2022 and was doing well at that time. He presents today for further post op follow up.  Current Outpatient Medications  Medication Sig Dispense Refill   cetirizine (ZYRTEC) 10 MG tablet TAKE 1 TABLET BY MOUTH EVERY DAY 90 tablet 0   ezetimibe (ZETIA) 10 MG tablet TAKE 1 TABLET BY MOUTH EVERY DAY 90 tablet 3   isosorbide mononitrate (IMDUR) 30 MG 24 hr tablet Take 1 tablet (30 mg total) by mouth daily. 90 tablet 3   losartan (COZAAR) 50 MG tablet Take 0.5 tablets (25 mg total) by mouth in the morning. 45 tablet 1   metoprolol succinate (TOPROL-XL) 25 MG 24 hr tablet TAKE 1 TABLET (25 MG TOTAL) BY MOUTH DAILY. 90 tablet 3   Multiple Vitamins-Minerals (CENTRUM SILVER 50+MEN) TABS Take 1 tablet by mouth daily.     nitroGLYCERIN (NITROSTAT) 0.4 MG SL tablet Place 1 tablet (0.4 mg total) under the tongue every 5 (five) minutes as needed for chest pain. 50 tablet 3   rosuvastatin (CRESTOR) 40 MG tablet Take 1 tablet (40 mg total) by mouth daily. 90 tablet 0  Vital Signs: Vitals:   09/26/22 1433  BP: 132/72  Pulse: 72  Resp: 20  SpO2: 94%    Physical Exam: CV-RRR Pulmonary-C;ear to auscultation bilaterally Wounds-All wounds on the right are clean, dry, well healed. Posterior incision is well healed as well. No sign of infection.  Diagnostic Tests: Narrative & Impression  CLINICAL DATA:  Status post mediastinal mass removal.  Hypertension.   EXAM: CHEST - 2 VIEW   COMPARISON:  August 30, 2022.   FINDINGS: The heart size and  mediastinal contours are within normal limits. Both lungs are clear. The visualized skeletal structures are unremarkable.   IMPRESSION: No active cardiopulmonary disease.     Electronically Signed   By: Lupita Raider M.D.   On: 09/26/2022 15:28  Impression and Plan:  He was seen by a pulmonologist (Dr. Belia Heman) on 09/05/2022. He recommended lung cancer screening referral program (remote tobacco history, quit 2000). I informed him today's CXR is stable (no acute abnormality noted). He has already returned to work. He will be seen PRN     Ardelle Balls, PA-C [facility] [phone]

## 2022-09-24 ENCOUNTER — Other Ambulatory Visit: Payer: Self-pay | Admitting: Thoracic Surgery (Cardiothoracic Vascular Surgery)

## 2022-09-24 DIAGNOSIS — J9859 Other diseases of mediastinum, not elsewhere classified: Secondary | ICD-10-CM

## 2022-09-26 ENCOUNTER — Ambulatory Visit (INDEPENDENT_AMBULATORY_CARE_PROVIDER_SITE_OTHER): Payer: Self-pay | Admitting: Physician Assistant

## 2022-09-26 ENCOUNTER — Ambulatory Visit
Admission: RE | Admit: 2022-09-26 | Discharge: 2022-09-26 | Disposition: A | Discharge status: Home / Self Care | Payer: PPO | Source: Ambulatory Visit | Attending: Thoracic Surgery (Cardiothoracic Vascular Surgery) | Admitting: Thoracic Surgery (Cardiothoracic Vascular Surgery)

## 2022-09-26 VITALS — BP 132/72 | HR 72 | Resp 20 | Ht 72.0 in | Wt 222.0 lb

## 2022-09-26 DIAGNOSIS — J9859 Other diseases of mediastinum, not elsewhere classified: Secondary | ICD-10-CM

## 2022-09-26 DIAGNOSIS — Z09 Encounter for follow-up examination after completed treatment for conditions other than malignant neoplasm: Secondary | ICD-10-CM

## 2022-09-26 DIAGNOSIS — I10 Essential (primary) hypertension: Secondary | ICD-10-CM | POA: Diagnosis not present

## 2022-10-10 ENCOUNTER — Other Ambulatory Visit: Payer: PPO

## 2022-10-10 DIAGNOSIS — D72829 Elevated white blood cell count, unspecified: Secondary | ICD-10-CM

## 2022-10-10 LAB — CBC WITH DIFFERENTIAL/PLATELET
Absolute Monocytes: 1008 cells/uL — ABNORMAL HIGH (ref 200–950)
Basophils Absolute: 72 cells/uL (ref 0–200)
Basophils Relative: 0.9 %
Eosinophils Absolute: 968 cells/uL — ABNORMAL HIGH (ref 15–500)
Eosinophils Relative: 12.1 %
HCT: 43.3 % (ref 38.5–50.0)
Hemoglobin: 13.8 g/dL (ref 13.2–17.1)
Lymphs Abs: 2312 cells/uL (ref 850–3900)
MCH: 28.3 pg (ref 27.0–33.0)
MCHC: 31.9 g/dL — ABNORMAL LOW (ref 32.0–36.0)
MCV: 88.7 fL (ref 80.0–100.0)
MPV: 10.7 fL (ref 7.5–12.5)
Monocytes Relative: 12.6 %
Neutro Abs: 3640 cells/uL (ref 1500–7800)
Neutrophils Relative %: 45.5 %
Platelets: 279 10*3/uL (ref 140–400)
RBC: 4.88 10*6/uL (ref 4.20–5.80)
RDW: 13.1 % (ref 11.0–15.0)
Total Lymphocyte: 28.9 %
WBC: 8 10*3/uL (ref 3.8–10.8)

## 2022-10-22 ENCOUNTER — Encounter: Payer: Self-pay | Admitting: Medical

## 2022-10-22 ENCOUNTER — Other Ambulatory Visit: Payer: Self-pay

## 2022-10-22 ENCOUNTER — Ambulatory Visit: Payer: PPO | Attending: Medical | Admitting: Medical

## 2022-10-22 VITALS — BP 128/80 | HR 67 | Ht 72.0 in | Wt 220.0 lb

## 2022-10-22 DIAGNOSIS — I10 Essential (primary) hypertension: Secondary | ICD-10-CM

## 2022-10-22 DIAGNOSIS — E782 Mixed hyperlipidemia: Secondary | ICD-10-CM | POA: Diagnosis not present

## 2022-10-22 DIAGNOSIS — I2510 Atherosclerotic heart disease of native coronary artery without angina pectoris: Secondary | ICD-10-CM | POA: Diagnosis not present

## 2022-10-22 DIAGNOSIS — R079 Chest pain, unspecified: Secondary | ICD-10-CM

## 2022-10-22 MED ORDER — LOSARTAN POTASSIUM 50 MG PO TABS: 25.0000 mg | ORAL_TABLET | Freq: Every morning | ORAL | 1 refills | Status: AC

## 2022-10-22 NOTE — Patient Instructions (Signed)
Medication Instructions:  Your physician recommends that you continue on your current medications as directed. Please refer to the Current Medication list given to you today.   *If you need a refill on your cardiac medications before your next appointment, please call your pharmacy*   Lab Work: No labs ordered today    Testing/Procedures: No test ordered today    Follow-Up: At [facility], you and your health needs are our priority.  As part of our continuing mission to provide you with exceptional heart care, we have created designated Provider Care Teams.  These Care Teams include your primary Cardiologist (physician) and Advanced Practice Providers (APPs -  Physician Assistants and Nurse Practitioners) who all work together to provide you with the care you need, when you need it.  We recommend signing up for the patient portal called "MyChart".  Sign up information is provided on this After Visit Summary.  MyChart is used to connect with patients for Virtual Visits (Telemedicine).  Patients are able to view lab/test results, encounter notes, upcoming appointments, etc.  Non-urgent messages can be sent to your provider as well.   To learn more about what you can do with MyChart, go to [email or web address].    Your next appointment:   6 month(s)  Provider:   You may see [name], MD or one of the following Advanced Practice Providers on your designated Care Team:   Nicolasa Ducking, NP Eula Listen, PA-C Cadence Fransico Michael, PA-C Charlsie Quest, NP

## 2022-10-22 NOTE — Progress Notes (Signed)
Cardiology Office Note:    Date:  10/22/2022   ID:  [name], DOB 1950-04-10, MRN [id number]  PCP:  Michael Brooks, MD  [facility] Cardiologist:  Michael Odea, MD  [facility] Electrophysiologist:  None   Referring MD: Michael Brooks, MD   Chief Complaint: chest pain  History of Present Illness:    [name] is a 72 y.o. male with a hx of CAD status post PCI to the proximal LAD the 09/2020, hypertension, hyperlipidemia, former smoker who presents for follow-up.  Heart cath 10/04/2020 showed proximal to mid LAD 80% stenosis, treated with drug-eluting stent.  RCA 75% stenosis, small nondominant not a PCI target.  Echo at that time showed EF of 60 to 65%, impaired relaxation.  Repeat heart cath in November 2022 showed patent mid LAD stent, 75% proximal RCA, nondominant vessel.  Patient was last seen in December 2023 is overall doing well from a cardiac perspective.  Today, the patient says he had a surgery on his back in July 2024 and a mass was removed. He was having chest soreness post-op. Apparently, patient was using chest and arms more than instructed. He was instructed to rest and was started on Imdur by PCP. Imdur minimally helped. The patient stopped using his arms and the chest pain resolved. The subsequently patient stopped the Imdur. The last time he had chest pain was a month ago. No shortness of breath. No lower leg edema, orthopnea or pnd. He is back to full work schedule.   Past Medical History:  Diagnosis Date   Adenomatous colon polyp 2004   Allergy    Anginal pain (HCC)    Arthritis    Diverticulosis 2011   Dyspnea    Former smoker    GERD (gastroesophageal reflux disease)    HLD (hyperlipidemia)    Hyperplastic colon polyp 2004   Hypertension    Melanoma in situ [facility])     Past Surgical History:  Procedure Laterality Date   COLONOSCOPY  01/21/2009   SEVERAL    CORONARY STENT INTERVENTION N/A 10/04/2020   Procedure: CORONARY STENT  INTERVENTION;  Surgeon: Marykay Lex, MD;  [facility] INVASIVE CV LAB;  Service: Cardiovascular;  Laterality: N/A;   LAMINECTOMY N/A 08/14/2022   Procedure: THORACIC LAMINECTOMY FOR TUMOR;  Surgeon: [name], MD;  Location: [facility] OR;  Service: Neurosurgery;  Laterality: N/A;   LEFT HEART CATH AND CORONARY ANGIOGRAPHY N/A 10/04/2020   Procedure: LEFT HEART CATH AND CORONARY ANGIOGRAPHY;  Surgeon: Marykay Lex, MD;  Location: [facility] INVASIVE CV LAB;  Service: Cardiovascular;  Laterality: N/A;   LEFT HEART CATH AND CORONARY ANGIOGRAPHY Left 11/30/2020   Procedure: LEFT HEART CATH AND CORONARY ANGIOGRAPHY;  Surgeon: Marykay Lex, MD;  Location: [facility] INVASIVE CV LAB;  Service: Cardiovascular;  Laterality: Left;   LYMPH NODE DISSECTION Bilateral 12/28/2021   Procedure: PELVIC LYMPH NODE DISSECTION;  Surgeon: [name], MD;  Location: [facility] ORS;  Service: Urology;  Laterality: Bilateral;   ROBOT ASSISTED LAPAROSCOPIC RADICAL PROSTATECTOMY N/A 12/28/2021   Procedure: XI ROBOTIC ASSISTED LAPAROSCOPIC RADICAL PROSTATECTOMY AND INDOCYANINE GREEN DYE INJECTION;  Surgeon: [name], MD;  Location: [facility] ORS;  Service: Urology;  Laterality: N/A;  3 HRS   TOOTH EXTRACTION     TOTAL KNEE ARTHROPLASTY Right 04/26/2020    Current Medications: Current Meds  Medication Sig   cetirizine (ZYRTEC) 10 MG tablet TAKE 1 TABLET BY MOUTH EVERY DAY   ezetimibe (ZETIA) 10 MG tablet TAKE 1 TABLET  BY MOUTH EVERY DAY   losartan (COZAAR) 50 MG tablet Take 0.5 tablets (25 mg total) by mouth in the morning.   metoprolol succinate (TOPROL-XL) 25 MG 24 hr tablet TAKE 1 TABLET (25 MG TOTAL) BY MOUTH DAILY.   Multiple Vitamins-Minerals (CENTRUM SILVER 50+MEN) TABS Take 1 tablet by mouth daily.   nitroGLYCERIN (NITROSTAT) 0.4 MG SL tablet Place 1 tablet (0.4 mg total) under the tongue every 5 (five) minutes as needed for chest pain.   rosuvastatin (CRESTOR) 40 MG tablet Take 1 tablet (40 mg total) by mouth daily.      Allergies:   Levaquin [levofloxacin in d5w]   Social History   Socioeconomic History   Marital status: Married    Spouse name: [name]   Number of children: 2   Years of education: Not on file   Highest education level: 12th grade  Occupational History   Occupation: part Investment banker, corporate: [employer]  Tobacco Use   Smoking status: Former    Current packs/day: 0.00    Average packs/day: 1 pack/day for 30.0 years (30.0 ttl pk-yrs)    Types: Cigarettes, Cigars    Start date: 05/12/1968    Quit date: 05/13/1998    Years since quitting: 24.4   Smokeless tobacco: Current    Types: Chew  Vaping Use   Vaping status: Never Used  Substance and Sexual Activity   Alcohol use: Yes    Alcohol/week: 2.0 standard drinks of alcohol    Types: 2 Cans of beer per week   Drug use: No   Sexual activity: Not Currently  Other Topics Concern   Not on file  Social History Narrative   Married x 51 years   2 grandsons   Social Determinants of Health   Financial Resource Strain: Low Risk  (06/18/2022)   Overall Financial Resource Strain (CARDIA)    Difficulty of Paying Living Expenses: Not very hard  Food Insecurity: No Food Insecurity (08/19/2022)   Hunger Vital Sign    Worried About Running Out of Food in the Last Year: Never true    Ran Out of Food in the Last Year: Never true  Transportation Needs: No Transportation Needs (08/19/2022)   PRAPARE - Administrator, Civil Service (Medical): No    Lack of Transportation (Non-Medical): No  Physical Activity: Unknown (06/18/2022)   Exercise Vital Sign    Days of Exercise per Week: 0 days    Minutes of Exercise per Session: Not on file  Stress: No Stress Concern Present (06/18/2022)   [employer] of Occupational Health - Occupational Stress Questionnaire    Feeling of Stress : Only a little  Social Connections: Moderately Isolated (06/18/2022)   Social Connection and Isolation Panel [NHANES]    Frequency of Communication with  Friends and Family: More than three times a week    Frequency of Social Gatherings with Friends and Family: Once a week    Attends Religious Services: More than 4 times per year    Active Member of [employer] or Organizations: No    Attends Banker Meetings: Never    Marital Status: Widowed     Family History: The patient's family history includes Diabetes in his daughter; Heart disease in his mother; Lung cancer in his father.  ROS:   Please see the history of present illness.     All other systems reviewed and are negative.  EKGs/Labs/Other Studies Reviewed:    The following studies were reviewed today:  LHC 11/2020    Prox LAD to Mid LAD stent is widely patent   Dist LAD lesion is 45% stenosed.   Prox RCA lesion is 75% stenosed.  Small nondominant vessel   The left ventricular systolic function is normal.   LV end diastolic pressure is normal.   There is no aortic valve stenosis.   Widely patent LAD stent with moderate stenosis of small non-dominant RCA (not PCI target). Otherwise minimal CAD in distal LAD & normal LCx-OM-LPL-PDA Normal LV size and function.  Normal EDP.       Recommendation: Consider nonischemic etiology for chest pain.    LHC 09/2020    CULPRIT LESION: Prox LAD to Mid LAD lesion is 80% stenosed.  (Calcified)   Scoring balloon angioplasty was performed using a BALLOON SCOREFLEX 2.75X15.   A drug-eluting stent was successfully placed using a SYNERGY XD 3.0X16 -> postdilated to 3.3 mm.   Post intervention, there is a 0% residual stenosis.   ------------------------------------------------------------------   Dist LAD lesion is 45% stenosed.   Prox RCA to Mid RCA lesion is 75% stenosed.  Small, nondominant vessel.  Not a PCI target.   ------------------------------------------------------------------   LV end diastolic pressure is normal.  There is no aortic valve stenosis.   SUMMARY Severe single-vessel disease with 80% eccentric calcified  lesion in the very proximal/ostial LAD with diffuse mild disease throughout the LAD.Marland Kitchen Successful DES PCI after ScoreFlex scoring balloon angioplasty of ostial and proximal LAD 80 % lesion reduced to 0% (TIMI-3 flow pre and post): Synergy DES 3.0 mm x 16 mm postdilated to 3.3 mm. Normal LV function by echocardiogram and normal LVEDP on cath.     RECOMMENDATIONS Okay for same-day discharge after 6 hours Continue to push aggressive GDMT for CAD: We will increase rosuvastatin to 40 mg.  Will defer titration of additional cardiac medications to his primary cardiologist.     Bryan Lemma, MD  Echo 09/2020 1. Left ventricular ejection fraction, by estimation, is 60 to 65%. The  left ventricle has normal function. The left ventricle has no regional  wall motion abnormalities. The left ventricular internal cavity size was  mildly dilated. Left ventricular  diastolic parameters are consistent with Grade I diastolic dysfunction  (impaired relaxation).   2. Right ventricular systolic function is normal. The right ventricular  size is normal. There is normal pulmonary artery systolic pressure. The  estimated right ventricular systolic pressure is 28.8 mmHg.   3. The mitral valve is normal in structure. Mild mitral valve  regurgitation. No evidence of mitral stenosis.   4. The aortic valve is normal in structure. Aortic valve regurgitation is  mild. Mild to moderate aortic valve sclerosis/calcification is present,  without any evidence of aortic stenosis.   5. The inferior vena cava is normal in size with greater than 50%  respiratory variability, suggesting right atrial pressure of 3 mmHg.    EKG:  EKG is ordered today.  The ekg ordered today demonstrates NSR 67bpm, TWI aVL  Recent Labs: 09/06/2022: ALT 14; BUN 19; Creat 1.31; Potassium 5.0; Sodium 139 10/10/2022: Hemoglobin 13.8; Platelets 279  Recent Lipid Panel    Component Value Date/Time   CHOL 93 06/06/2021 0704   TRIG 70 06/06/2021  0704   HDL 36 (L) 06/06/2021 0704   CHOLHDL 2.6 06/06/2021 0704   VLDL 14 06/06/2021 0704   LDLCALC 43 06/06/2021 0704   LDLCALC 86 09/12/2020 1611    Physical Exam:    VS:  BP 128/80 (BP Location:  Left Arm, Patient Position: Sitting, Cuff Size: Normal)   Pulse 67   Ht 6' (1.829 m)   Wt 220 lb (99.8 kg)   SpO2 94%   BMI 29.84 kg/m     Wt Readings from Last 3 Encounters:  10/22/22 220 lb (99.8 kg)  09/26/22 222 lb (100.7 kg)  09/06/22 214 lb (97.1 kg)     GEN:  Well nourished, well developed in no acute distress HEENT: Normal NECK: No JVD; No carotid bruits LYMPHATICS: No lymphadenopathy CARDIAC: RRR, no murmurs, rubs, gallops RESPIRATORY:  Clear to auscultation without rales, wheezing or rhonchi  ABDOMEN: Soft, non-tender, non-distended MUSCULOSKELETAL:  No edema; No deformity  SKIN: Warm and dry NEUROLOGIC:  Alert and oriented x 3 PSYCHIATRIC:  Normal affect   ASSESSMENT:    1. Chest pain, unspecified type   2. Coronary artery disease involving native coronary artery of native heart, unspecified whether angina present   3. Hyperlipidemia, mixed   4. Essential hypertension    PLAN:    In order of problems listed above:  Chest pain CAD s/p DES to pLAD in 09/2020 Patient had back surgery in July 2024 and a mass was removed.  Postop period, the patient was being very active (using his arms) even though he was instructed not to.  He subsequently developed postop chest pain, worse with movement.  PCP recommended rest and he was started on Imdur.  Imdur minimally improved pain.  Patient has not had chest pain in a month.  Suspect chest pain secondary to recent back surgery.  Patient has since been back at work and doing all functions normally.  We will continue to monitor symptoms.  No ischemic workup at this time.  Continue Crestor, Zetia, Toprol, and Imdur.  Patient reported aspirin was previously stopped.  HLD LDL 43.  Continue Crestor and Zetia.  Hypertension BP  normal today. Continue losartan, Toprol and Imdur.  Disposition: Follow up in 6 month(s) with MD/APP   Signed, Xyon Lukasik David Stall, PA-C  10/22/2022 2:50 PM    [facility]

## 2022-10-23 ENCOUNTER — Other Ambulatory Visit: Payer: Self-pay

## 2022-10-23 MED ORDER — CETIRIZINE HCL 10 MG PO TABS: 10.0000 mg | ORAL_TABLET | Freq: Every day | ORAL | 0 refills | Status: DC

## 2022-10-23 NOTE — Telephone Encounter (Signed)
Prescription Request  10/23/2022  LOV: 09/06/22  What is the name of the medication or equipment? cetirizine (ZYRTEC) 10 MG tablet [[id number]]  Have you contacted your pharmacy to request a refill? Yes   Which pharmacy would you like this sent to?  [facility] [address] [place] - [address] [place] [address] Phone: [phone] Fax: [phone]    Patient notified that their request is being sent to the clinical staff for review and that they should receive a response within 2 business days.   Please advise at [facility] [phone]

## 2022-10-23 NOTE — Telephone Encounter (Signed)
Last refill 09/06/22 within protocol.  Requested Prescriptions  Pending Prescriptions Disp Refills   cetirizine (ZYRTEC) 10 MG tablet 90 tablet 0    Sig: Take 1 tablet (10 mg total) by mouth daily.     Ear, Nose, and Throat:  Antihistamines 2 Failed - 10/23/2022  9:13 AM      Failed - Cr in normal range and within 360 days    Creat  Date Value Ref Range Status  09/06/2022 1.31 (H) 0.70 - 1.28 mg/dL Final         Failed - Valid encounter within last 12 months    Recent Outpatient Visits           1 year ago Tinea corporis   [facility] Pickard, Priscille Heidelberg, MD   1 year ago Viral upper respiratory tract infection   [facility] [name], MD   1 year ago Viral URI   [facility] Tanya Nones Priscille Heidelberg, MD   2 years ago Chest pain, unspecified type   [facility] [name], MD   2 years ago Strain of lumbar region, initial encounter   [facility] Pickard, Priscille Heidelberg, MD       Future Appointments             In 5 months Agbor-Etang, Arlys John, MD [facility] at [facility]

## 2022-10-28 ENCOUNTER — Telehealth: Payer: Self-pay

## 2022-10-28 NOTE — Telephone Encounter (Signed)
Pt aware you are out of the office today.  Pt with c/o sinus pain/pressure and drainage that makes him cough. No fever and pt states he doesn't feel "bad." Pt states he is taking his allergy medication daily. Pt asks if Rx could be sent in to treat sx? Thank you.

## 2022-10-29 ENCOUNTER — Other Ambulatory Visit: Payer: Self-pay | Admitting: Family Medicine

## 2022-10-29 MED ORDER — AMOXICILLIN 875 MG PO TABS: 875.0000 mg | ORAL_TABLET | Freq: Two times a day (BID) | ORAL | 0 refills | Status: AC

## 2022-11-11 ENCOUNTER — Ambulatory Visit: Payer: PPO | Admitting: Family Medicine

## 2022-11-11 ENCOUNTER — Encounter: Payer: Self-pay | Admitting: Family Medicine

## 2022-11-11 VITALS — BP 134/86 | HR 54 | Temp 97.5°F | Ht 72.0 in | Wt 220.0 lb

## 2022-11-11 DIAGNOSIS — J329 Chronic sinusitis, unspecified: Secondary | ICD-10-CM

## 2022-11-11 MED ORDER — LORATADINE 10 MG PO TABS: 10.0000 mg | ORAL_TABLET | Freq: Every day | ORAL | 11 refills | Status: DC

## 2022-11-11 NOTE — Progress Notes (Signed)
Subjective:    Patient ID: [name], male    DOB: 30-Apr-1950, 72 y.o.   MRN: [id number] Patient states has been dealing with sinus drainage now for more than a month.  He states that he is taken amoxicillin and a Z-Pak without any relief.  He takes Zyrtec on a daily basis.  Symptoms are primarily runny nose.  This occurs frequently throughout the day for no reason.  He also reports postnasal drip and sinus congestion.  He denies any fevers or chills.  He denies any facial pain.  He denies any headache.  He denies any cough or chest pain. Past Medical History:  Diagnosis Date   Adenomatous colon polyp 2004   Allergy    Anginal pain (HCC)    Arthritis    Diverticulosis 2011   Dyspnea    Former smoker    GERD (gastroesophageal reflux disease)    HLD (hyperlipidemia)    Hyperplastic colon polyp 2004   Hypertension    Melanoma in situ [facility])    Past Surgical History:  Procedure Laterality Date   COLONOSCOPY  01/21/2009   SEVERAL    CORONARY STENT INTERVENTION N/A 10/04/2020   Procedure: CORONARY STENT INTERVENTION;  Surgeon: Marykay Lex, MD;  [facility] INVASIVE CV LAB;  Service: Cardiovascular;  Laterality: N/A;   LAMINECTOMY N/A 08/14/2022   Procedure: THORACIC LAMINECTOMY FOR TUMOR;  Surgeon: [name], MD;  Location: [facility] OR;  Service: Neurosurgery;  Laterality: N/A;   LEFT HEART CATH AND CORONARY ANGIOGRAPHY N/A 10/04/2020   Procedure: LEFT HEART CATH AND CORONARY ANGIOGRAPHY;  Surgeon: Marykay Lex, MD;  Location: [facility] INVASIVE CV LAB;  Service: Cardiovascular;  Laterality: N/A;   LEFT HEART CATH AND CORONARY ANGIOGRAPHY Left 11/30/2020   Procedure: LEFT HEART CATH AND CORONARY ANGIOGRAPHY;  Surgeon: Marykay Lex, MD;  Location: [facility] INVASIVE CV LAB;  Service: Cardiovascular;  Laterality: Left;   LYMPH NODE DISSECTION Bilateral 12/28/2021   Procedure: PELVIC LYMPH NODE DISSECTION;  Surgeon: [name], MD;  Location: [facility] ORS;  Service: Urology;  Laterality:  Bilateral;   ROBOT ASSISTED LAPAROSCOPIC RADICAL PROSTATECTOMY N/A 12/28/2021   Procedure: XI ROBOTIC ASSISTED LAPAROSCOPIC RADICAL PROSTATECTOMY AND INDOCYANINE GREEN DYE INJECTION;  Surgeon: [name], MD;  Location: [facility] ORS;  Service: Urology;  Laterality: N/A;  3 HRS   TOOTH EXTRACTION     TOTAL KNEE ARTHROPLASTY Right 04/26/2020   Current Outpatient Medications on File Prior to Visit  Medication Sig Dispense Refill   ezetimibe (ZETIA) 10 MG tablet TAKE 1 TABLET BY MOUTH EVERY DAY 90 tablet 3   losartan (COZAAR) 50 MG tablet Take 0.5 tablets (25 mg total) by mouth in the morning. 45 tablet 1   metoprolol succinate (TOPROL-XL) 25 MG 24 hr tablet TAKE 1 TABLET (25 MG TOTAL) BY MOUTH DAILY. 90 tablet 3   Multiple Vitamins-Minerals (CENTRUM SILVER 50+MEN) TABS Take 1 tablet by mouth daily.     nitroGLYCERIN (NITROSTAT) 0.4 MG SL tablet Place 1 tablet (0.4 mg total) under the tongue every 5 (five) minutes as needed for chest pain. 50 tablet 3   rosuvastatin (CRESTOR) 40 MG tablet Take 1 tablet (40 mg total) by mouth daily. 90 tablet 0   No current facility-administered medications on file prior to visit.   Allergies  Allergen Reactions   Levaquin [Levofloxacin In D5w] Shortness Of Breath   Social History   Socioeconomic History   Marital status: Married    Spouse name: Dwana Curd   Number of children:  2   Years of education: Not on file   Highest education level: 12th grade  Occupational History   Occupation: part Investment banker, corporate: [employer]  Tobacco Use   Smoking status: Former    Current packs/day: 0.00    Average packs/day: 1 pack/day for 30.0 years (30.0 ttl pk-yrs)    Types: Cigarettes, Cigars    Start date: 05/12/1968    Quit date: 05/13/1998    Years since quitting: 24.5   Smokeless tobacco: Current    Types: Chew  Vaping Use   Vaping status: Never Used  Substance and Sexual Activity   Alcohol use: Yes    Alcohol/week: 2.0 standard drinks of alcohol    Types: 2 Cans  of beer per week   Drug use: No   Sexual activity: Not Currently  Other Topics Concern   Not on file  Social History Narrative   Married x 51 years   2 grandsons   Social Determinants of Health   Financial Resource Strain: Low Risk  (06/18/2022)   Overall Financial Resource Strain (CARDIA)    Difficulty of Paying Living Expenses: Not very hard  Food Insecurity: No Food Insecurity (08/19/2022)   Hunger Vital Sign    Worried About Running Out of Food in the Last Year: Never true    Ran Out of Food in the Last Year: Never true  Transportation Needs: No Transportation Needs (08/19/2022)   PRAPARE - Administrator, Civil Service (Medical): No    Lack of Transportation (Non-Medical): No  Physical Activity: Unknown (06/18/2022)   Exercise Vital Sign    Days of Exercise per Week: 0 days    Minutes of Exercise per Session: Not on file  Stress: No Stress Concern Present (06/18/2022)   [employer] of Occupational Health - Occupational Stress Questionnaire    Feeling of Stress : Only a little  Social Connections: Moderately Isolated (06/18/2022)   Social Connection and Isolation Panel [NHANES]    Frequency of Communication with Friends and Family: More than three times a week    Frequency of Social Gatherings with Friends and Family: Once a week    Attends Religious Services: More than 4 times per year    Active Member of [employer] or Organizations: No    Attends Banker Meetings: Not on file    Marital Status: Widowed  Intimate Partner Violence: Not At Risk (08/15/2022)   Humiliation, Afraid, Rape, and Kick questionnaire    Fear of Current or Ex-Partner: No    Emotionally Abused: No    Physically Abused: No    Sexually Abused: No      Review of Systems  All other systems reviewed and are negative.      Objective:   Physical Exam Vitals reviewed.  Constitutional:      General: He is not in acute distress.    Appearance: He is well-developed. He is not  diaphoretic.  HENT:     Right Ear: Tympanic membrane and ear canal normal.     Left Ear: Tympanic membrane and ear canal normal.     Nose: Congestion and rhinorrhea present. No mucosal edema.     Right Sinus: No maxillary sinus tenderness or frontal sinus tenderness.     Left Sinus: No maxillary sinus tenderness or frontal sinus tenderness.     Mouth/Throat:     Pharynx: No oropharyngeal exudate.  Eyes:     Conjunctiva/sclera: Conjunctivae normal.     Pupils: Pupils are  equal, round, and reactive to light.  Cardiovascular:     Rate and Rhythm: Normal rate and regular rhythm.     Heart sounds: Normal heart sounds.  Pulmonary:     Effort: Pulmonary effort is normal. No respiratory distress.     Breath sounds: No wheezing, rhonchi or rales.  Musculoskeletal:     Cervical back: Neck supple.  Lymphadenopathy:     Cervical: No cervical adenopathy.  Skin:    Findings: No erythema or rash.           Assessment & Plan:  Rhinosinusitis I believe this is most likely due to allergies.  Recommended switching Zyrtec to Claritin 10 mg a day given the fact has been on Zyrtec for years.  Add Flonase 2 sprays each nostril daily.  If not improving consider Singulair.  Reassess in 1 week

## 2022-11-14 ENCOUNTER — Ambulatory Visit (INDEPENDENT_AMBULATORY_CARE_PROVIDER_SITE_OTHER): Payer: PPO

## 2022-11-14 VITALS — Ht 72.0 in | Wt 220.0 lb

## 2022-11-14 DIAGNOSIS — Z0000 Encounter for general adult medical examination without abnormal findings: Secondary | ICD-10-CM | POA: Diagnosis not present

## 2022-11-14 NOTE — Patient Instructions (Signed)
Mr. Pistone , Thank you for taking time to come for your Medicare Wellness Visit. I appreciate your ongoing commitment to your health goals. Please review the following plan we discussed and let me know if I can assist you in the future.   Referrals/Orders/Follow-Ups/Clinician Recommendations: Aim for 30 minutes of exercise or brisk walking, 6-8 glasses of water, and 5 servings of fruits and vegetables each day.  This is a list of the screening recommended for you and due dates:  Health Maintenance  Topic Date Due   COVID-19 Vaccine (1) Never done   Zoster (Shingles) Vaccine (1 of 2) 11/14/2022*   Flu Shot  04/21/2023*   Colon Cancer Screening  07/05/2023*   DTaP/Tdap/Td vaccine (3 - Td or Tdap) 05/19/2023   Medicare Annual Wellness Visit  11/14/2023   Pneumonia Vaccine  Completed   Hepatitis C Screening  Completed   HPV Vaccine  Aged Out  *Topic was postponed. The date shown is not the original due date.    Advanced directives: (ACP Link)Information on Advanced Care Planning can be found at [facility] [place] Advance Health Care Directives Advance Health Care Directives ([email or web address]   Next Medicare Annual Wellness Visit scheduled for next year: Yes

## 2022-11-14 NOTE — Progress Notes (Signed)
Subjective:   [name] is a 72 y.o. male who presents for Medicare Annual/Subsequent preventive examination.  Visit Complete: Virtual I connected with  [name] on 11/14/22 by a audio enabled telemedicine application and verified that I am speaking with the correct person using two identifiers.  Patient Location: Home  Provider Location: Home Office  I discussed the limitations of evaluation and management by telemedicine. The patient expressed understanding and agreed to proceed.  Vital Signs: Because this visit was a virtual/telehealth visit, some criteria may be missing or patient reported. Any vitals not documented were not able to be obtained and vitals that have been documented are patient reported.  Cardiac Risk Factors include: advanced age (>46men, >34 women);male gender;hypertension;dyslipidemia     Objective:    Today's Vitals   11/14/22 0830  Weight: 220 lb (99.8 kg)  Height: 6' (1.829 m)   Body mass index is 29.84 kg/m.     11/14/2022    8:41 AM 08/14/2022    6:15 AM 08/12/2022    2:46 PM 12/28/2021    5:00 PM 12/18/2021    7:53 AM 10/08/2021   12:04 AM 11/30/2020   10:51 AM  Advanced Directives  Does Patient Have a Medical Advance Directive? No No No No No No   Would patient like information on creating a medical advance directive? Yes (MAU/Ambulatory/Procedural Areas - Information given) No - Patient declined No - Patient declined No - Patient declined No - Patient declined  No - Patient declined    Current Medications (verified) Outpatient Encounter Medications as of 11/14/2022  Medication Sig   ezetimibe (ZETIA) 10 MG tablet TAKE 1 TABLET BY MOUTH EVERY DAY   loratadine (CLARITIN) 10 MG tablet Take 1 tablet (10 mg total) by mouth daily.   losartan (COZAAR) 50 MG tablet Take 0.5 tablets (25 mg total) by mouth in the morning.   metoprolol succinate (TOPROL-XL) 25 MG 24 hr tablet TAKE 1 TABLET (25 MG TOTAL) BY MOUTH DAILY.   Multiple  Vitamins-Minerals (CENTRUM SILVER 50+MEN) TABS Take 1 tablet by mouth daily.   nitroGLYCERIN (NITROSTAT) 0.4 MG SL tablet Place 1 tablet (0.4 mg total) under the tongue every 5 (five) minutes as needed for chest pain.   rosuvastatin (CRESTOR) 40 MG tablet Take 1 tablet (40 mg total) by mouth daily.   No facility-administered encounter medications on file as of 11/14/2022.    Allergies (verified) Levaquin [levofloxacin in d5w]   History: Past Medical History:  Diagnosis Date   Adenomatous colon polyp 2004   Allergy    Anginal pain (HCC)    Arthritis    Diverticulosis 2011   Dyspnea    Former smoker    GERD (gastroesophageal reflux disease)    HLD (hyperlipidemia)    Hyperplastic colon polyp 2004   Hypertension    Melanoma in situ [facility])    Past Surgical History:  Procedure Laterality Date   COLONOSCOPY  01/21/2009   SEVERAL    CORONARY STENT INTERVENTION N/A 10/04/2020   Procedure: CORONARY STENT INTERVENTION;  Surgeon: Marykay Lex, MD;  Location: [facility] INVASIVE CV LAB;  Service: Cardiovascular;  Laterality: N/A;   LAMINECTOMY N/A 08/14/2022   Procedure: THORACIC LAMINECTOMY FOR TUMOR;  Surgeon: [name], MD;  Location: [facility] OR;  Service: Neurosurgery;  Laterality: N/A;   LEFT HEART CATH AND CORONARY ANGIOGRAPHY N/A 10/04/2020   Procedure: LEFT HEART CATH AND CORONARY ANGIOGRAPHY;  Surgeon: Marykay Lex, MD;  Location: [facility] INVASIVE CV LAB;  Service: Cardiovascular;  Laterality: N/A;   LEFT HEART CATH AND CORONARY ANGIOGRAPHY Left 11/30/2020   Procedure: LEFT HEART CATH AND CORONARY ANGIOGRAPHY;  Surgeon: Marykay Lex, MD;  Location: [facility] INVASIVE CV LAB;  Service: Cardiovascular;  Laterality: Left;   LYMPH NODE DISSECTION Bilateral 12/28/2021   Procedure: PELVIC LYMPH NODE DISSECTION;  Surgeon: [name], MD;  Location: [facility] ORS;  Service: Urology;  Laterality: Bilateral;   ROBOT ASSISTED LAPAROSCOPIC RADICAL PROSTATECTOMY N/A 12/28/2021   Procedure: XI ROBOTIC  ASSISTED LAPAROSCOPIC RADICAL PROSTATECTOMY AND INDOCYANINE GREEN DYE INJECTION;  Surgeon: [name], MD;  Location: [facility] ORS;  Service: Urology;  Laterality: N/A;  3 HRS   TOOTH EXTRACTION     TOTAL KNEE ARTHROPLASTY Right 04/26/2020   Family History  Problem Relation Age of Onset   Heart disease Mother        died in her 71's   Lung cancer Father    Diabetes Daughter    Social History   Socioeconomic History   Marital status: Married    Spouse name: [name]   Number of children: 2   Years of education: Not on file   Highest education level: 12th grade  Occupational History   Occupation: part Investment banker, corporate: [employer]  Tobacco Use   Smoking status: Former    Current packs/day: 0.00    Average packs/day: 1 pack/day for 30.0 years (30.0 ttl pk-yrs)    Types: Cigarettes, Cigars    Start date: 05/12/1968    Quit date: 05/13/1998    Years since quitting: 24.5   Smokeless tobacco: Current    Types: Chew  Vaping Use   Vaping status: Never Used  Substance and Sexual Activity   Alcohol use: Yes    Alcohol/week: 2.0 standard drinks of alcohol    Types: 2 Cans of beer per week   Drug use: No   Sexual activity: Not Currently  Other Topics Concern   Not on file  Social History Narrative   Married x 51 years   2 grandsons   Social Determinants of Health   Financial Resource Strain: Low Risk  (06/18/2022)   Overall Financial Resource Strain (CARDIA)    Difficulty of Paying Living Expenses: Not very hard  Food Insecurity: No Food Insecurity (11/14/2022)   Hunger Vital Sign    Worried About Running Out of Food in the Last Year: Never true    Ran Out of Food in the Last Year: Never true  Transportation Needs: No Transportation Needs (11/14/2022)   PRAPARE - Administrator, Civil Service (Medical): No    Lack of Transportation (Non-Medical): No  Physical Activity: Insufficiently Active (11/14/2022)   Exercise Vital Sign    Days of Exercise per Week: 3 days     Minutes of Exercise per Session: 30 min  Stress: No Stress Concern Present (11/14/2022)   [employer] of Occupational Health - Occupational Stress Questionnaire    Feeling of Stress : Only a little  Social Connections: Moderately Isolated (11/14/2022)   Social Connection and Isolation Panel [NHANES]    Frequency of Communication with Friends and Family: More than three times a week    Frequency of Social Gatherings with Friends and Family: Once a week    Attends Religious Services: More than 4 times per year    Active Member of [employer] or Organizations: No    Attends Banker Meetings: Never    Marital Status: Widowed    Tobacco Counseling Ready to quit: Not Answered  Counseling given: Not Answered   Clinical Intake:  Pre-visit preparation completed: Yes  Pain : No/denies pain     Diabetes: No  How often do you need to have someone help you when you read instructions, pamphlets, or other written materials from your doctor or pharmacy?: 1 - Never  Interpreter Needed?: No  Information entered by :: Kandis Fantasia LPN   Activities of Daily Living    11/14/2022    8:38 AM 08/15/2022   12:34 AM  In your present state of health, do you have any difficulty performing the following activities:  Hearing? 0 0  Vision? 0 0  Difficulty concentrating or making decisions? 0 0  Walking or climbing stairs? 0 0  Dressing or bathing? 0 0  Doing errands, shopping? 0 0  Preparing Food and eating ? N   Using the Toilet? N   In the past six months, have you accidently leaked urine? N   Do you have problems with loss of bowel control? N   Managing your Medications? N   Managing your Finances? N   Housekeeping or managing your Housekeeping? N     Patient Care Team: [name], MD as PCP - General (Family Medicine) [name], MD as PCP - Cardiology (Cardiology) Erroll Luna, [facility] (Inactive) as Pharmacist (Pharmacist)  Indicate any recent Medical  Services you may have received from other than [facility] providers in the past year (date may be approximate).     Assessment:   This is a routine wellness examination for [facility].  Hearing/Vision screen Hearing Screening - Comments:: Denies hearing difficulties   Vision Screening - Comments:: Wears rx glasses - up to date with routine eye exams with [facility]     Goals Addressed             This Visit's Progress    Remain active and independent       COMPLETED: Track and Manage My Blood Pressure-Hypertension       Timeframe:  Long-Range Goal Priority:  High Start Date: 06/08/2020                             Expected End Date: 12/09/20                       Follow Up Date 08/20/20    - check blood pressure daily - choose a place to take my blood pressure (home, clinic or office, retail store) - write blood pressure results in a log or diary    Why is this important?   You won't feel high blood pressure, but it can still hurt your blood vessels.  High blood pressure can cause heart or kidney problems. It can also cause a stroke.  Making lifestyle changes like losing a little weight or eating less salt will help.  Checking your blood pressure at home and at different times of the day can help to control blood pressure.  If the doctor prescribes medicine remember to take it the way the doctor ordered.  Call the office if you cannot afford the medicine or if there are questions about it.     Notes: Goal < 140/90      Depression Screen    11/14/2022    8:40 AM 07/05/2022    3:28 PM 11/09/2021    2:55 PM 11/09/2020    2:00 PM 11/16/2019    2:46 PM 11/27/2017  3:26 PM 03/20/2017    9:06 AM  PHQ 2/9 Scores  PHQ - 2 Score 0 0 0 0 0 0 0    Fall Risk    11/14/2022    8:43 AM 07/05/2022    3:28 PM 11/09/2021    2:56 PM 11/05/2021    8:59 AM 11/09/2020    2:05 PM  Fall Risk   Falls in the past year? 0 0 0 0 0  Number falls in past yr: 0 0  0 0  Injury with Fall?  0 0  0 0  Risk for fall due to : No Fall Risks No Fall Risks   No Fall Risks  Follow up Falls prevention discussed;Education provided;Falls evaluation completed Falls prevention discussed   Falls prevention discussed    MEDICARE RISK AT HOME: Medicare Risk at Home Any stairs in or around the home?: No If so, are there any without handrails?: No Home free of loose throw rugs in walkways, pet beds, electrical cords, etc?: Yes Adequate lighting in your home to reduce risk of falls?: Yes Life alert?: No Use of a cane, walker or w/c?: No Grab bars in the bathroom?: Yes Shower chair or bench in shower?: No Elevated toilet seat or a handicapped toilet?: Yes  TIMED UP AND GO:  Was the test performed?  No    Cognitive Function:        11/14/2022    8:43 AM 11/09/2021    2:59 PM 11/09/2020    2:08 PM  6CIT Screen  What Year? 0 points 0 points 0 points  What month? 0 points 0 points 0 points  What time? 0 points 0 points 0 points  Count back from 20 0 points 0 points 0 points  Months in reverse 0 points 0 points 0 points  Repeat phrase 0 points 0 points 0 points  Total Score 0 points 0 points 0 points    Immunizations Immunization History  Administered Date(s) Administered   Influenza,inj,Quad PF,6+ Mos 12/16/2012, 11/03/2014   Influenza-Unspecified 10/21/2016, 10/11/2020   Pneumococcal Conjugate-13 07/01/2014   Pneumococcal Polysaccharide-23 06/19/2012, 11/30/2019   Td 06/12/2001   Tdap 05/18/2013    TDAP status: Up to date  Flu Vaccine status: Up to date  Pneumococcal vaccine status: Up to date  Covid-19 vaccine status: Information provided on how to obtain vaccines.   Qualifies for Shingles Vaccine? Yes   Zostavax completed No   Shingrix Completed?: No.    Education has been provided regarding the importance of this vaccine. Patient has been advised to call insurance company to determine out of pocket expense if they have not yet received this vaccine. Advised may  also receive vaccine at local pharmacy or Health Dept. Verbalized acceptance and understanding.  Screening Tests Health Maintenance  Topic Date Due   COVID-19 Vaccine (1) Never done   Zoster Vaccines- Shingrix (1 of 2) 11/14/2022 (Originally 12/14/1969)   INFLUENZA VACCINE  04/21/2023 (Originally 08/22/2022)   Colonoscopy  07/05/2023 (Originally 11/30/2019)   DTaP/Tdap/Td (3 - Td or Tdap) 05/19/2023   Medicare Annual Wellness (AWV)  11/14/2023   Pneumonia Vaccine 67+ Years old  Completed   Hepatitis C Screening  Completed   HPV VACCINES  Aged Out    Health Maintenance  Health Maintenance Due  Topic Date Due   COVID-19 Vaccine (1) Never done    Colorectal cancer screening:  Patient declines at this time   Lung Cancer Screening: (Low Dose CT Chest recommended if Age 16-80 years, 20 pack-year  currently smoking OR have quit w/in 15years.) does not qualify.   Lung Cancer Screening Referral: n/a  Additional Screening:  Hepatitis C Screening: does qualify; Completed 03/20/17  Vision Screening: Recommended annual ophthalmology exams for early detection of glaucoma and other disorders of the eye. Is the patient up to date with their annual eye exam?  Yes  Who is the provider or what is the name of the office in which the patient attends annual eye exams? [facility] [address] If pt is not established with a provider, would they like to be referred to a provider to establish care? No .   Dental Screening: Recommended annual dental exams for proper oral hygiene  Community Resource Referral / Chronic Care Management: CRR required this visit?  No   CCM required this visit?  No     Plan:     I have personally reviewed and noted the following in the patient's chart:   Medical and social history Use of alcohol, tobacco or illicit drugs  Current medications and supplements including opioid prescriptions. Patient is not currently taking opioid prescriptions. Functional ability and  status Nutritional status Physical activity Advanced directives List of other physicians Hospitalizations, surgeries, and ER visits in previous 12 months Vitals Screenings to include cognitive, depression, and falls Referrals and appointments  In addition, I have reviewed and discussed with patient certain preventive protocols, quality metrics, and best practice recommendations. A written personalized care plan for preventive services as well as general preventive health recommendations were provided to patient.     Kandis Fantasia [place], [place]   [id number]   After Visit Summary: (MyChart) Due to this being a telephonic visit, the after visit summary with patients personalized plan was offered to patient via MyChart   Nurse Notes: No concerns at this time

## 2022-11-20 ENCOUNTER — Emergency Department (HOSPITAL_COMMUNITY): Payer: PPO

## 2022-11-20 ENCOUNTER — Other Ambulatory Visit: Payer: Self-pay

## 2022-11-20 ENCOUNTER — Emergency Department (HOSPITAL_COMMUNITY)
Admission: EM | Admit: 2022-11-20 | Discharge: 2022-11-20 | Disposition: A | Discharge status: Home / Self Care | Payer: PPO | Attending: Emergency Medicine | Admitting: Emergency Medicine

## 2022-11-20 ENCOUNTER — Encounter (HOSPITAL_COMMUNITY): Payer: Self-pay

## 2022-11-20 DIAGNOSIS — R079 Chest pain, unspecified: Secondary | ICD-10-CM | POA: Diagnosis not present

## 2022-11-20 DIAGNOSIS — I2510 Atherosclerotic heart disease of native coronary artery without angina pectoris: Secondary | ICD-10-CM | POA: Diagnosis not present

## 2022-11-20 DIAGNOSIS — Z1152 Encounter for screening for COVID-19: Secondary | ICD-10-CM | POA: Diagnosis not present

## 2022-11-20 DIAGNOSIS — R42 Dizziness and giddiness: Secondary | ICD-10-CM | POA: Insufficient documentation

## 2022-11-20 DIAGNOSIS — R519 Headache, unspecified: Secondary | ICD-10-CM | POA: Diagnosis not present

## 2022-11-20 LAB — CBC WITH DIFFERENTIAL/PLATELET
Abs Immature Granulocytes: 0.03 10*3/uL (ref 0.00–0.07)
Basophils Absolute: 0.1 10*3/uL (ref 0.0–0.1)
Basophils Relative: 1 %
Eosinophils Absolute: 0.5 10*3/uL (ref 0.0–0.5)
Eosinophils Relative: 5 %
HCT: 43.8 % (ref 39.0–52.0)
Hemoglobin: 14.4 g/dL (ref 13.0–17.0)
Immature Granulocytes: 0 %
Lymphocytes Relative: 18 %
Lymphs Abs: 1.8 10*3/uL (ref 0.7–4.0)
MCH: 28.5 pg (ref 26.0–34.0)
MCHC: 32.9 g/dL (ref 30.0–36.0)
MCV: 86.6 fL (ref 80.0–100.0)
Monocytes Absolute: 0.9 10*3/uL (ref 0.1–1.0)
Monocytes Relative: 9 %
Neutro Abs: 6.8 10*3/uL (ref 1.7–7.7)
Neutrophils Relative %: 67 %
Platelets: 278 10*3/uL (ref 150–400)
RBC: 5.06 MIL/uL (ref 4.22–5.81)
RDW: 13.2 % (ref 11.5–15.5)
WBC: 10.1 10*3/uL (ref 4.0–10.5)
nRBC: 0 % (ref 0.0–0.2)

## 2022-11-20 LAB — URINALYSIS, ROUTINE W REFLEX MICROSCOPIC
Bacteria, UA: NONE SEEN
Bilirubin Urine: NEGATIVE
Glucose, UA: NEGATIVE mg/dL
Hgb urine dipstick: NEGATIVE
Ketones, ur: NEGATIVE mg/dL
Nitrite: NEGATIVE
Protein, ur: NEGATIVE mg/dL
Specific Gravity, Urine: 1.017 (ref 1.005–1.030)
pH: 5 (ref 5.0–8.0)

## 2022-11-20 LAB — TROPONIN I (HIGH SENSITIVITY)
Troponin I (High Sensitivity): 4 ng/L (ref <18)
Troponin I (High Sensitivity): 4 ng/L (ref <18)

## 2022-11-20 LAB — COMPREHENSIVE METABOLIC PANEL
ALT: 17 U/L (ref 0–44)
AST: 19 U/L (ref 15–41)
Albumin: 4 g/dL (ref 3.5–5.0)
Alkaline Phosphatase: 79 U/L (ref 38–126)
Anion gap: 7 (ref 5–15)
BUN: 17 mg/dL (ref 8–23)
CO2: 24 mmol/L (ref 22–32)
Calcium: 9.1 mg/dL (ref 8.9–10.3)
Chloride: 107 mmol/L (ref 98–111)
Creatinine, Ser: 1.12 mg/dL (ref 0.61–1.24)
GFR, Estimated: >60 mL/min (ref >60)
Glucose, Bld: 118 mg/dL — ABNORMAL HIGH (ref 70–99)
Potassium: 4.2 mmol/L (ref 3.5–5.1)
Sodium: 138 mmol/L (ref 135–145)
Total Bilirubin: 0.6 mg/dL (ref 0.3–1.2)
Total Protein: 6.7 g/dL (ref 6.5–8.1)

## 2022-11-20 LAB — LIPASE, BLOOD: Lipase: 31 U/L (ref 11–51)

## 2022-11-20 LAB — SARS CORONAVIRUS 2 BY RT PCR: SARS Coronavirus 2 by RT PCR: NEGATIVE

## 2022-11-20 MED ORDER — SODIUM CHLORIDE 0.9 % IV BOLUS
500.0000 mL | Freq: Once | INTRAVENOUS | Status: AC
  Administered 2022-11-20: 500 mL via INTRAVENOUS

## 2022-11-20 MED ORDER — MECLIZINE HCL 25 MG PO TABS
25.0000 mg | ORAL_TABLET | Freq: Once | ORAL | Status: AC
  Administered 2022-11-20: 25 mg via ORAL
  Filled 2022-11-20: qty 1

## 2022-11-20 MED ORDER — ACETAMINOPHEN 325 MG PO TABS
650.0000 mg | ORAL_TABLET | Freq: Once | ORAL | Status: AC
  Administered 2022-11-20: 650 mg via ORAL
  Filled 2022-11-20: qty 2

## 2022-11-20 MED ORDER — MECLIZINE HCL 12.5 MG PO TABS: 12.5000 mg | ORAL_TABLET | Freq: Three times a day (TID) | ORAL | 0 refills | Status: DC | PRN

## 2022-11-20 NOTE — ED Notes (Signed)
Family at bedside. 

## 2022-11-20 NOTE — ED Provider Notes (Signed)
[place] EMERGENCY DEPARTMENT AT [facility] Provider Note   CSN: [id number] Arrival date & time: 11/20/22  8295     History  Chief Complaint  Patient presents with   Dizziness    [name] is a 72 y.o. male history of of hyperlipidemia, CAD, mediastinal mass, LAD stent, schwannoma presented for headache, dizziness, chest pain.  Patient stated that when he got before 15 this morning he noticed that he had a pounding headache around his head but denied vision changes.  Patient states that he went to lay back down the headache got better.  Patient states that when he is upright the headache gets worse and notes that he was walking he felt unsteady and had to use the wall to help him walk.  Patient also note the second time tried getting up this morning he started having left-sided chest pain that did not radiate to back or arm or neck that ultimately resolved after taking a dose of his nitroglycerin.  Patient's blood pressure at home was reportedly 160 systolic patient was concerned that his blood pressure may be contributing to his symptoms.  Patient did take his blood pressure meds this morning.  Patient denies a vision changes, neck pain, shortness of breath, abdominal pain, nausea/vomiting, dysuria, changes sensation.  Patient has not taken anything for the headache yet.  Patient states for the past 2 weeks he has been dealing with sinus issues.   Home Medications Prior to Admission medications   Medication Sig Start Date End Date Taking? Authorizing Provider  meclizine (ANTIVERT) 12.5 MG tablet Take 1 tablet (12.5 mg total) by mouth 3 (three) times daily as needed for dizziness. 11/20/22  Yes Evlyn Kanner T, PA-C  ezetimibe (ZETIA) 10 MG tablet TAKE 1 TABLET BY MOUTH EVERY DAY 04/09/21   [name], MD  loratadine (CLARITIN) 10 MG tablet Take 1 tablet (10 mg total) by mouth daily. 11/11/22   [name], MD  losartan (COZAAR) 50 MG tablet Take 0.5 tablets (25  mg total) by mouth in the morning. 10/22/22   [name], MD  metoprolol succinate (TOPROL-XL) 25 MG 24 hr tablet TAKE 1 TABLET (25 MG TOTAL) BY MOUTH DAILY. 12/05/21   [name], MD  Multiple Vitamins-Minerals (CENTRUM SILVER 50+MEN) TABS Take 1 tablet by mouth daily.    [provider]  nitroGLYCERIN (NITROSTAT) 0.4 MG SL tablet Place 1 tablet (0.4 mg total) under the tongue every 5 (five) minutes as needed for chest pain. 09/06/22   [name], MD  rosuvastatin (CRESTOR) 40 MG tablet Take 1 tablet (40 mg total) by mouth daily. 10/17/21   [name], MD      Allergies    Levaquin [levofloxacin in d5w]    Review of Systems   Review of Systems  Neurological:  Positive for dizziness.    Physical Exam Updated Vital Signs BP (!) 149/86   Pulse 64   Temp 98.2 F (36.8 C) (Oral)   Resp 18   SpO2 100%  Physical Exam Vitals reviewed.  Constitutional:      General: He is not in acute distress. HENT:     Head: Normocephalic and atraumatic.  Eyes:     Extraocular Movements: Extraocular movements intact.     Conjunctiva/sclera: Conjunctivae normal.     Pupils: Pupils are equal, round, and reactive to light.  Cardiovascular:     Rate and Rhythm: Normal rate and regular rhythm.     Pulses: Normal pulses.  Heart sounds: Normal heart sounds.     Comments: 2+ bilateral radial/dorsalis pedis pulses with regular rate Pulmonary:     Effort: Pulmonary effort is normal. No respiratory distress.     Breath sounds: Normal breath sounds.  Abdominal:     Palpations: Abdomen is soft.     Tenderness: There is no abdominal tenderness. There is no guarding or rebound.  Musculoskeletal:        General: Normal range of motion.     Cervical back: Normal range of motion and neck supple.     Comments: 5 out of 5 bilateral grip/leg extension strength  Skin:    General: Skin is warm and dry.     Capillary Refill: Capillary refill takes less than 2 seconds.   Neurological:     General: No focal deficit present.     Mental Status: He is alert and oriented to person, place, and time.     Sensory: Sensation is intact.     Motor: Motor function is intact.     Coordination: Coordination is intact.     Gait: Gait is intact.     Comments: Sensation intact in all 4 limbs Vision grossly intact Cranial nerves III through XII intact  Psychiatric:        Mood and Affect: Mood normal.     ED Results / Procedures / Treatments   Labs (all labs ordered are listed, but only abnormal results are displayed) Labs Reviewed  URINALYSIS, ROUTINE W REFLEX MICROSCOPIC - Abnormal; Notable for the following components:      Result Value   Leukocytes,Ua MODERATE (*)    All other components within normal limits  COMPREHENSIVE METABOLIC PANEL - Abnormal; Notable for the following components:   Glucose, Bld 118 (*)    All other components within normal limits  SARS CORONAVIRUS 2 BY RT PCR  CBC WITH DIFFERENTIAL/PLATELET  LIPASE, BLOOD  TROPONIN I (HIGH SENSITIVITY)  TROPONIN I (HIGH SENSITIVITY)    EKG EKG Interpretation Date/Time:  Wednesday November 20 2022 06:41:19 EDT Ventricular Rate:  64 PR Interval:  139 QRS Duration:  111 QT Interval:  431 QTC Calculation: 445 R Axis:   24  Text Interpretation: Sinus rhythm Low voltage, precordial leads since last tracing no significant change Confirmed by Rolan Bucco [phone]) on 11/20/2022 9:21:07 AM  Radiology DG Chest Port 1 View  Result Date: 11/20/2022 CLINICAL DATA:  Chest pain. EXAM: PORTABLE CHEST 1 VIEW COMPARISON:  09/26/2022. FINDINGS: Bilateral lung fields are clear. Bilateral lateral costophrenic angles are clear. Normal cardio-mediastinal silhouette. No acute osseous abnormalities. The soft tissues are within normal limits. IMPRESSION: *No active disease. Electronically Signed   By: Jules Schick M.D.   On: 11/20/2022 09:29   CT Head Wo Contrast  Result Date: 11/20/2022 CLINICAL DATA:   Provided history: Dizziness.  Headache. EXAM: CT HEAD WITHOUT CONTRAST TECHNIQUE: Contiguous axial images were obtained from the base of the skull through the vertex without intravenous contrast. RADIATION DOSE REDUCTION: This exam was performed according to the departmental dose-optimization program which includes automated exposure control, adjustment of the mA and/or kV according to patient size and/or use of iterative reconstruction technique. COMPARISON:  Head CT 05/02/2015. FINDINGS: Brain: Mild generalized cerebral atrophy. There is no acute intracranial hemorrhage. No demarcated cortical infarct. No extra-axial fluid collection. No evidence of an intracranial mass. No midline shift. Vascular: No hyperdense vessel.  Atherosclerotic calcifications. Skull: No calvarial fracture or aggressive osseous lesion. 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 Mild mucosal thickening within the left frontal sinus. Moderate left ethmoid sinusitis. Trace mucosal thickening within the right maxillary sinus. Small-volume frothy secretions, and minimal background mucosal thickening, within the left sphenoid sinus. Small-volume frothy secretions, and minimal background mucosal thickening, within the right maxillary sinus at the imaged levels. Mild mucosal thickening within the left maxillary sinus at the imaged levels. IMPRESSION: 1. No evidence of an acute intracranial abnormality. 2. Mild generalized cerebral atrophy. 3. 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 Background paranasal sinus disease as described. Electronically Signed   By: Jackey Loge D.O.   On: 11/20/2022 08:32    Procedures Procedures    Medications Ordered in ED Medications  acetaminophen (TYLENOL) tablet 650 mg (650 mg Oral Given 11/20/22  0900)  meclizine (ANTIVERT) tablet 25 mg (25 mg Oral Given 11/20/22 1054)  sodium chloride 0.9 % bolus 500 mL (500 mLs Intravenous New Bag/Given 11/20/22 1204)    ED Course/ Medical Decision Making/ A&P                                 Medical Decision Making Amount and/or Complexity of Data Reviewed Labs: ordered. Radiology: ordered.  Risk OTC drugs.   [name] 72 y.o. presented today for dizziness, headache, chest pain. Working DDx that I considered at this time includes, but not limited to, ACS, artery dissection, migraine, SAH, epidural/subdural hematoma, sinusitis, meningitis, aortic dissection, tamponade, PE, dural vein thrombosis, metastasis, sinusitis.  R/o DDx: ACS, artery dissection, migraine, SAH, epidural/subdural hematoma, sinusitis, meningitis, aortic dissection, tamponade, PE, dural vein thrombosis, metastasis: These are considered less likely due to history of present illness, physical exam, labs/imaging findings  Review of prior external notes: 11/11/2022 office visit  Unique Tests and My Interpretation:  CBC: Unremarkable CMP: Unremarkable Lipase: Unremarkable Troponin: 4, 4 UA: Unremarkable CT head without contrast: Mucus in bilateral maxillary sinuses Chest x-ray: No acute findings COVID: Negative Orthostatic vitals: Unremarkable  Social Determinants of Health: none  Discussion with Independent Historian:  Daughter  Discussion of Management of Tests: None  Risk: Medium: prescription drug management  Risk Stratification Score: none  Staffed with Melfi, MD  Plan: On exam patient was in no acute distress stable vitals.  Patient's exam was largely unremarkable as there were no acute findings however patient is high risk and so will obtain cardiac workup along with CT head.  Will start with CT head without contrast as patient is endorsing any neck pain and was endorsing a headache that was sudden and maximal at onset of pain to go to [facility].  Patient  given Tylenol for his headache.  Physician evaluated patient and states he had some mild horizontal nystagmus indicating possible peripheral vertigo.  Patient given meclizine.  Patient's labs were all reassuring I went to go reevaluate the patient patient stated that he was still mildly vertiginous.  I walked the patient and he continued to endorse the symptoms however was not having nausea vomiting at that was not impeding his ability to walk.  Will give small bolus fluids to see if this helps as this may be a lightheadedness type of picture.  However still anticipate discharge with meclizine and primary care follow-up.  Patient's chest pain has completely resolved at this point.  Discussed smoking cessation with patient and  was they were offerred resources to help stop.  Total time was 5 min CPT code 28413.   Patient does feel slightly better after fluids and at this time is comfortable to be discharged.  I spoke to the patient and he states he has been on multiple antibiotics including Z-Pak and amoxicillin for his sinuses by his primary care provider with no relief.  Patient does endorse using sinus rinses but has not used Afrin at this time.  Encourage patient to continue using the sinus rinses and to use Afrin for no more than 3 to 5 days and to follow-up with his primary care provider.  Will provide meclizine at home and patient given strict return precautions.  At time of discharge patient's chest pain has completely resolved and he is able to walk without ataxia.  Patient was given return precautions. Patient stable for discharge at this time.  Patient verbalized understanding of plan.  This chart was dictated using voice recognition software.  Despite best efforts to proofread,  errors can occur which can change the documentation meaning.         Final Clinical Impression(s) / ED Diagnoses Final diagnoses:  Vertigo    Rx / DC Orders ED Discharge Orders          Ordered     meclizine (ANTIVERT) 12.5 MG tablet  3 times daily PRN        11/20/22 1158              Netta Corrigan, PA-C 11/20/22 1246    Rolan Bucco, MD 11/20/22 1315

## 2022-11-20 NOTE — ED Triage Notes (Addendum)
Pt states that he has had dizziness and htn since this morning. Pt also reports having a pounding headache. Pt took his nitroglycerin and his bp medication this morning.

## 2022-11-20 NOTE — Discharge Instructions (Addendum)
Please follow-up with your primary care provider in regards to be symptoms and ER visit.  Today your exam was reassuring as well as your labs and you most likely have a benign version of vertigo.  The CT scan does show some mucus in your sinuses which could be contributing to the dizziness so please continue to use the Flonase along with sinus rinses.  You may use Afrin for no more than 3 days if you have not already use this nasal spray over-the-counter.  Please follow-up with your primary care provider if symptoms change or worsen please return to ER.

## 2022-11-28 ENCOUNTER — Ambulatory Visit (INDEPENDENT_AMBULATORY_CARE_PROVIDER_SITE_OTHER): Payer: PPO | Admitting: Family Medicine

## 2022-11-28 VITALS — BP 122/70 | HR 59 | Ht 72.0 in | Wt 220.4 lb

## 2022-11-28 DIAGNOSIS — M545 Low back pain, unspecified: Secondary | ICD-10-CM | POA: Diagnosis not present

## 2022-11-28 DIAGNOSIS — F5101 Primary insomnia: Secondary | ICD-10-CM

## 2022-11-28 DIAGNOSIS — J329 Chronic sinusitis, unspecified: Secondary | ICD-10-CM

## 2022-11-28 MED ORDER — ZOLPIDEM TARTRATE 10 MG PO TABS: 10.0000 mg | ORAL_TABLET | Freq: Every evening | ORAL | 1 refills | Status: DC | PRN

## 2022-11-28 MED ORDER — CLOTRIMAZOLE 1 % EX CREA: 1.0000 | TOPICAL_CREAM | Freq: Two times a day (BID) | CUTANEOUS | 2 refills | Status: DC

## 2022-11-28 NOTE — Progress Notes (Signed)
Subjective:    Patient ID: [name], male    DOB: 01/20/1951, 72 y.o.   MRN: [id number] Patient has been treated multiple times for sinusitis over the last several weeks.  Recently went to the emergency room on October 30 with dizziness and was diagnosed with vertigo.  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  Patient also has deviated septum.  Patient states that the vertigo has improved but he also reports trouble sleeping and right-sided low back pain.  He has a palpable muscle spasm on his right lower back that reproduces the pain with deep palpation.  He states that he has not been able to sleep since the death of his wife.  He can sleep 3 or 4 hours and then he wakes up and tosses and turns.  He would like to try to reestablish a normal pattern of sleeping. Past Medical History:  Diagnosis Date   Adenomatous colon polyp 2004   Allergy    Anginal pain (HCC)    Arthritis    Diverticulosis 2011   Dyspnea    Former smoker    GERD (gastroesophageal reflux disease)    HLD (hyperlipidemia)    Hyperplastic colon polyp 2004   Hypertension    Melanoma in situ [facility])    Past Surgical History:  Procedure Laterality Date   COLONOSCOPY  01/21/2009   SEVERAL    CORONARY STENT INTERVENTION N/A 10/04/2020   Procedure: CORONARY STENT INTERVENTION;  Surgeon: Marykay Lex, MD;  Location: [facility] INVASIVE CV LAB;  Service: Cardiovascular;  Laterality: N/A;   LAMINECTOMY N/A 08/14/2022   Procedure: THORACIC LAMINECTOMY FOR TUMOR;  Surgeon: [name], MD;  Location: [facility] OR;  Service: Neurosurgery;  Laterality: N/A;   LEFT HEART CATH AND CORONARY ANGIOGRAPHY N/A 10/04/2020   Procedure: LEFT HEART CATH AND CORONARY ANGIOGRAPHY;  Surgeon: Marykay Lex, MD;  Location: [facility] INVASIVE CV LAB;  Service: Cardiovascular;  Laterality: N/A;   LEFT HEART CATH AND CORONARY ANGIOGRAPHY Left 11/30/2020   Procedure: LEFT HEART CATH  AND CORONARY ANGIOGRAPHY;  Surgeon: Marykay Lex, MD;  Location: [facility] INVASIVE CV LAB;  Service: Cardiovascular;  Laterality: Left;   LYMPH NODE DISSECTION Bilateral 12/28/2021   Procedure: PELVIC LYMPH NODE DISSECTION;  Surgeon: [name], MD;  Location: [facility] ORS;  Service: Urology;  Laterality: Bilateral;   ROBOT ASSISTED LAPAROSCOPIC RADICAL PROSTATECTOMY N/A 12/28/2021   Procedure: XI ROBOTIC ASSISTED LAPAROSCOPIC RADICAL PROSTATECTOMY AND INDOCYANINE GREEN DYE INJECTION;  Surgeon: [name], MD;  Location: [facility] ORS;  Service: Urology;  Laterality: N/A;  3 HRS   TOOTH EXTRACTION     TOTAL KNEE ARTHROPLASTY Right 04/26/2020   Current Outpatient Medications on File Prior to Visit  Medication Sig Dispense Refill   ezetimibe (ZETIA) 10 MG tablet TAKE 1 TABLET BY MOUTH EVERY DAY 90 tablet 3   loratadine (CLARITIN) 10 MG tablet Take 1 tablet (10 mg total) by mouth daily. 30 tablet 11   losartan (COZAAR) 50 MG tablet Take 0.5 tablets (25 mg total) by mouth in the morning. 45 tablet 1   meclizine (ANTIVERT) 12.5 MG tablet Take 1 tablet (12.5 mg total) by mouth 3 (three) times daily as needed for dizziness. 30 tablet 0   metoprolol succinate (TOPROL-XL) 25 MG 24 hr tablet TAKE 1 TABLET (25 MG TOTAL) BY MOUTH DAILY. 90 tablet 3   Multiple Vitamins-Minerals (CENTRUM SILVER 50+MEN) TABS Take 1 tablet by  mouth daily.     nitroGLYCERIN (NITROSTAT) 0.4 MG SL tablet Place 1 tablet (0.4 mg total) under the tongue every 5 (five) minutes as needed for chest pain. 50 tablet 3   rosuvastatin (CRESTOR) 40 MG tablet Take 1 tablet (40 mg total) by mouth daily. 90 tablet 0   No current facility-administered medications on file prior to visit.   Allergies  Allergen Reactions   Levaquin [Levofloxacin In D5w] Shortness Of Breath   Social History   Socioeconomic History   Marital status: Married    Spouse name: [name]   Number of children: 2   Years of education: Not on file   Highest education level:  12th grade  Occupational History   Occupation: part Investment banker, corporate: [employer]  Tobacco Use   Smoking status: Former    Current packs/day: 0.00    Average packs/day: 1 pack/day for 30.0 years (30.0 ttl pk-yrs)    Types: Cigarettes, Cigars    Start date: 05/12/1968    Quit date: 05/13/1998    Years since quitting: 24.5   Smokeless tobacco: Current    Types: Chew  Vaping Use   Vaping status: Never Used  Substance and Sexual Activity   Alcohol use: Yes    Alcohol/week: 2.0 standard drinks of alcohol    Types: 2 Cans of beer per week   Drug use: No   Sexual activity: Not Currently  Other Topics Concern   Not on file  Social History Narrative   Married x 51 years   2 grandsons   Social Determinants of Health   Financial Resource Strain: Low Risk  (06/18/2022)   Overall Financial Resource Strain (CARDIA)    Difficulty of Paying Living Expenses: Not very hard  Food Insecurity: No Food Insecurity (11/14/2022)   Hunger Vital Sign    Worried About Running Out of Food in the Last Year: Never true    Ran Out of Food in the Last Year: Never true  Transportation Needs: No Transportation Needs (11/14/2022)   PRAPARE - Administrator, Civil Service (Medical): No    Lack of Transportation (Non-Medical): No  Physical Activity: Insufficiently Active (11/14/2022)   Exercise Vital Sign    Days of Exercise per Week: 3 days    Minutes of Exercise per Session: 30 min  Stress: No Stress Concern Present (11/14/2022)   [employer] of Occupational Health - Occupational Stress Questionnaire    Feeling of Stress : Only a little  Social Connections: Moderately Isolated (11/14/2022)   Social Connection and Isolation Panel [NHANES]    Frequency of Communication with Friends and Family: More than three times a week    Frequency of Social Gatherings with Friends and Family: Once a week    Attends Religious Services: More than 4 times per year    Active Member of [employer] or  Organizations: No    Attends Banker Meetings: Never    Marital Status: Widowed  Intimate Partner Violence: Not At Risk (11/14/2022)   Humiliation, Afraid, Rape, and Kick questionnaire    Fear of Current or Ex-Partner: No    Emotionally Abused: No    Physically Abused: No    Sexually Abused: No      Review of Systems  All other systems reviewed and are negative.      Objective:   Physical Exam Vitals reviewed.  Constitutional:      General: He is not in acute distress.    Appearance: He is  well-developed. He is not diaphoretic.  HENT:     Right Ear: Tympanic membrane and ear canal normal.     Left Ear: Tympanic membrane and ear canal normal.     Nose: Congestion and rhinorrhea present. No mucosal edema.     Right Sinus: No maxillary sinus tenderness or frontal sinus tenderness.     Left Sinus: No maxillary sinus tenderness or frontal sinus tenderness.     Mouth/Throat:     Pharynx: No oropharyngeal exudate.  Eyes:     Conjunctiva/sclera: Conjunctivae normal.     Pupils: Pupils are equal, round, and reactive to light.  Cardiovascular:     Rate and Rhythm: Normal rate and regular rhythm.     Heart sounds: Normal heart sounds.  Pulmonary:     Effort: Pulmonary effort is normal. No respiratory distress.     Breath sounds: No wheezing, rhonchi or rales.  Musculoskeletal:     Cervical back: Neck supple.     Lumbar back: Spasms and tenderness present.       Back:  Lymphadenopathy:     Cervical: No cervical adenopathy.  Skin:    Findings: No erythema or rash.           Assessment & Plan:  Primary insomnia  Acute right-sided low back pain without sciatica  Chronic rhinosinusitis We have tried the patient on multiple antibiotics and steroids along with antihistamines, Singulair, and Flonase.  Nothing seems to be helping.  At this point I offer the patient an ENT consultation to discuss surgery to evacuate the sinus and correct the septal deviation.   Patient declines that at the present time.  He would really like to focus on getting better sleep.  Therefore we have recommended trying Ambien 5 mg p.o. nightly for 1 or 2 weeks to reestablish a normal pattern.  We will then wean off the Ambien.  The pain in his back is secondary to a muscle spasm.  He declines a muscle relaxer at present time as h he wants to avoid too much medication

## 2022-12-03 DIAGNOSIS — N5231 Erectile dysfunction following radical prostatectomy: Secondary | ICD-10-CM | POA: Diagnosis not present

## 2022-12-09 ENCOUNTER — Telehealth: Payer: Self-pay

## 2022-12-09 NOTE — Telephone Encounter (Signed)
Copied from CRM [id number]. Topic: Clinical - Medical Advice >> Dec 09, 2022  8:15 AM Theodis Sato wrote: PT still has a sinus infection after over a week after his hospital follow up and he would like to know what the next steps are that he should take.

## 2022-12-10 ENCOUNTER — Other Ambulatory Visit: Payer: Self-pay | Admitting: Family Medicine

## 2022-12-10 ENCOUNTER — Ambulatory Visit (INDEPENDENT_AMBULATORY_CARE_PROVIDER_SITE_OTHER): Payer: PPO | Admitting: Family Medicine

## 2022-12-10 VITALS — BP 138/78 | HR 72 | Temp 97.6°F | Ht 72.0 in | Wt 220.0 lb

## 2022-12-10 DIAGNOSIS — G44201 Tension-type headache, unspecified, intractable: Secondary | ICD-10-CM | POA: Diagnosis not present

## 2022-12-10 DIAGNOSIS — J329 Chronic sinusitis, unspecified: Secondary | ICD-10-CM

## 2022-12-10 MED ORDER — ZOLPIDEM TARTRATE 10 MG PO TABS: 10.0000 mg | ORAL_TABLET | Freq: Every evening | ORAL | 1 refills | Status: DC | PRN

## 2022-12-10 MED ORDER — PREDNISONE 20 MG PO TABS: ORAL_TABLET | ORAL | 0 refills | Status: DC

## 2022-12-10 MED ORDER — ROSUVASTATIN CALCIUM 40 MG PO TABS: 40.0000 mg | ORAL_TABLET | Freq: Every day | ORAL | 3 refills | Status: DC

## 2022-12-10 MED ORDER — TIZANIDINE HCL 4 MG PO TABS: 4.0000 mg | ORAL_TABLET | Freq: Four times a day (QID) | ORAL | 0 refills | Status: DC | PRN

## 2022-12-10 NOTE — Progress Notes (Signed)
Subjective:    Patient ID: [name], male    DOB: 1950/02/06, 72 y.o.   MRN: [id number] Patient complains of pain in his left temple for 3 or 4 days.  The pain is located posterior to the temporal artery above the ear and into the occiput.  It also radiates down the left side of his neck.  It is an aching pain.  He describes it as a dull constant headache.  He denies any photophobia or phonophobia or scotoma or scintillating lines.  He does complain of some mild blurry vision however this is vague in nature and not consistent.  There is no tenderness to palpation over the temporal artery.  He denies any claudication.  He denies any shoulder pain or pain to the suggest PMR Past Medical History:  Diagnosis Date   Adenomatous colon polyp 2004   Allergy    Anginal pain (HCC)    Arthritis    Diverticulosis 2011   Dyspnea    Former smoker    GERD (gastroesophageal reflux disease)    HLD (hyperlipidemia)    Hyperplastic colon polyp 2004   Hypertension    Melanoma in situ [facility])    Past Surgical History:  Procedure Laterality Date   COLONOSCOPY  01/21/2009   SEVERAL    CORONARY STENT INTERVENTION N/A 10/04/2020   Procedure: CORONARY STENT INTERVENTION;  Surgeon: Marykay Lex, MD;  [facility] INVASIVE CV LAB;  Service: Cardiovascular;  Laterality: N/A;   LAMINECTOMY N/A 08/14/2022   Procedure: THORACIC LAMINECTOMY FOR TUMOR;  Surgeon: [name], MD;  Location: [facility] OR;  Service: Neurosurgery;  Laterality: N/A;   LEFT HEART CATH AND CORONARY ANGIOGRAPHY N/A 10/04/2020   Procedure: LEFT HEART CATH AND CORONARY ANGIOGRAPHY;  Surgeon: Marykay Lex, MD;  Location: [facility] INVASIVE CV LAB;  Service: Cardiovascular;  Laterality: N/A;   LEFT HEART CATH AND CORONARY ANGIOGRAPHY Left 11/30/2020   Procedure: LEFT HEART CATH AND CORONARY ANGIOGRAPHY;  Surgeon: Marykay Lex, MD;  Location: [facility] INVASIVE CV LAB;  Service: Cardiovascular;  Laterality: Left;   LYMPH NODE DISSECTION Bilateral  12/28/2021   Procedure: PELVIC LYMPH NODE DISSECTION;  Surgeon: [name], MD;  Location: [facility] ORS;  Service: Urology;  Laterality: Bilateral;   ROBOT ASSISTED LAPAROSCOPIC RADICAL PROSTATECTOMY N/A 12/28/2021   Procedure: XI ROBOTIC ASSISTED LAPAROSCOPIC RADICAL PROSTATECTOMY AND INDOCYANINE GREEN DYE INJECTION;  Surgeon: [name], MD;  Location: [facility] ORS;  Service: Urology;  Laterality: N/A;  3 HRS   TOOTH EXTRACTION     TOTAL KNEE ARTHROPLASTY Right 04/26/2020   Current Outpatient Medications on File Prior to Visit  Medication Sig Dispense Refill   clotrimazole (LOTRIMIN AF) 1 % cream Apply 1 Application topically 2 (two) times daily. 30 g 2   ezetimibe (ZETIA) 10 MG tablet TAKE 1 TABLET BY MOUTH EVERY DAY 90 tablet 3   loratadine (CLARITIN) 10 MG tablet Take 1 tablet (10 mg total) by mouth daily. 30 tablet 11   losartan (COZAAR) 50 MG tablet Take 0.5 tablets (25 mg total) by mouth in the morning. (Patient taking differently: Take 50 mg by mouth in the morning. Pt states he is taking 50 mg daily-12/10/2022.) 45 tablet 1   meclizine (ANTIVERT) 12.5 MG tablet Take 1 tablet (12.5 mg total) by mouth 3 (three) times daily as needed for dizziness. 30 tablet 0   metoprolol succinate (TOPROL-XL) 25 MG 24 hr tablet TAKE 1 TABLET (25 MG TOTAL) BY MOUTH DAILY. 90 tablet 3   Multiple Vitamins-Minerals (  CENTRUM SILVER 50+MEN) TABS Take 1 tablet by mouth daily.     nitroGLYCERIN (NITROSTAT) 0.4 MG SL tablet Place 1 tablet (0.4 mg total) under the tongue every 5 (five) minutes as needed for chest pain. 50 tablet 3   No current facility-administered medications on file prior to visit.   Allergies  Allergen Reactions   Levaquin [Levofloxacin In D5w] Shortness Of Breath   Social History   Socioeconomic History   Marital status: Married    Spouse name: [name]   Number of children: 2   Years of education: Not on file   Highest education level: 12th grade  Occupational History   Occupation: part  Investment banker, corporate: [employer]  Tobacco Use   Smoking status: Former    Current packs/day: 0.00    Average packs/day: 1 pack/day for 30.0 years (30.0 ttl pk-yrs)    Types: Cigarettes, Cigars    Start date: 05/12/1968    Quit date: 05/13/1998    Years since quitting: 24.5   Smokeless tobacco: Current    Types: Chew  Vaping Use   Vaping status: Never Used  Substance and Sexual Activity   Alcohol use: Yes    Alcohol/week: 2.0 standard drinks of alcohol    Types: 2 Cans of beer per week   Drug use: No   Sexual activity: Not Currently  Other Topics Concern   Not on file  Social History Narrative   Married x 51 years   2 grandsons   Social Determinants of Health   Financial Resource Strain: Low Risk  (12/09/2022)   Overall Financial Resource Strain (CARDIA)    Difficulty of Paying Living Expenses: Not hard at all  Food Insecurity: No Food Insecurity (12/09/2022)   Hunger Vital Sign    Worried About Running Out of Food in the Last Year: Never true    Ran Out of Food in the Last Year: Never true  Transportation Needs: No Transportation Needs (12/09/2022)   PRAPARE - Administrator, Civil Service (Medical): No    Lack of Transportation (Non-Medical): No  Physical Activity: Insufficiently Active (12/09/2022)   Exercise Vital Sign    Days of Exercise per Week: 2 days    Minutes of Exercise per Session: 20 min  Stress: No Stress Concern Present (12/09/2022)   [employer] of Occupational Health - Occupational Stress Questionnaire    Feeling of Stress : Not at all  Social Connections: Moderately Integrated (12/09/2022)   Social Connection and Isolation Panel [NHANES]    Frequency of Communication with Friends and Family: More than three times a week    Frequency of Social Gatherings with Friends and Family: Twice a week    Attends Religious Services: More than 4 times per year    Active Member of [employer] or Organizations: Yes    Attends Banker Meetings:  More than 4 times per year    Marital Status: Widowed  Recent Concern: Social Connections - Moderately Isolated (11/14/2022)   Social Connection and Isolation Panel [NHANES]    Frequency of Communication with Friends and Family: More than three times a week    Frequency of Social Gatherings with Friends and Family: Once a week    Attends Religious Services: More than 4 times per year    Active Member of [employer] or Organizations: No    Attends Banker Meetings: Never    Marital Status: Widowed  Intimate Partner Violence: Not At Risk (11/14/2022)   Humiliation, Afraid,  Rape, and Kick questionnaire    Fear of Current or Ex-Partner: No    Emotionally Abused: No    Physically Abused: No    Sexually Abused: No      Review of Systems  All other systems reviewed and are negative.      Objective:   Physical Exam Vitals reviewed.  Constitutional:      General: He is not in acute distress.    Appearance: He is well-developed. He is not diaphoretic.  HENT:     Head: Normocephalic and atraumatic.     Jaw: No trismus, tenderness or pain on movement.      Right Ear: Tympanic membrane and ear canal normal.     Left Ear: Tympanic membrane and ear canal normal.     Nose: Congestion and rhinorrhea present. No mucosal edema.     Right Sinus: No maxillary sinus tenderness or frontal sinus tenderness.     Left Sinus: No maxillary sinus tenderness or frontal sinus tenderness.     Mouth/Throat:     Pharynx: No oropharyngeal exudate.  Eyes:     General: No visual field deficit.    Extraocular Movements: Extraocular movements intact.     Conjunctiva/sclera: Conjunctivae normal.     Pupils: Pupils are equal, round, and reactive to light.  Cardiovascular:     Rate and Rhythm: Normal rate and regular rhythm.     Heart sounds: Normal heart sounds.  Pulmonary:     Effort: Pulmonary effort is normal. No respiratory distress.     Breath sounds: No wheezing, rhonchi or rales.   Musculoskeletal:     Cervical back: Neck supple.  Lymphadenopathy:     Cervical: No cervical adenopathy.  Skin:    Findings: No erythema or rash.  Neurological:     General: No focal deficit present.     Mental Status: He is alert and oriented to person, place, and time.     Cranial Nerves: No cranial nerve deficit, dysarthria or facial asymmetry.     Sensory: Sensation is intact. No sensory deficit.     Motor: Motor function is intact. No weakness, atrophy, abnormal muscle tone, seizure activity or pronator drift.     Coordination: Coordination is intact.     Gait: Gait is intact.           Assessment & Plan:  Acute intractable tension-type headache - Plan: CBC with Differential/Platelet, COMPLETE METABOLIC PANEL WITH GFR, Sedimentation rate I believe the patient is dealing with a tension headache.  I see no sign of any stroke or neurologic deficit on his exam.  He denies any muscle weakness or numbness or tingling or slurred speech on his review of systems.  Less likely would be of migraine or temporal arteritis.  I will check a sed rate.  At the present time I will treat the patient has intractable tension headache with Zanaflex 4 mg every 6 hours as needed and a prednisone taper pack as an anti-inflammatory.  If the headache is not improving, the patient is to contact me immediately.  He continues to report trouble sleeping but he states that he sleeps fine as long as he takes Ambien.  I believe the patient is dealing with grief during Christmas of the loss of his wife coupled with anxiety and sadness racing thoughts.  We discussed possibly trying Lexapro but the patient will continue the Ambien at present time just to see if things will improve after he gets through Christmas.  This is his  first holiday without her

## 2022-12-11 LAB — COMPLETE METABOLIC PANEL WITH GFR
AG Ratio: 1.9 (calc) (ref 1.0–2.5)
ALT: 13 U/L (ref 9–46)
AST: 15 U/L (ref 10–35)
Albumin: 4.1 g/dL (ref 3.6–5.1)
Alkaline phosphatase (APISO): 93 U/L (ref 35–144)
BUN/Creatinine Ratio: 14 (calc) (ref 6–22)
BUN: 18 mg/dL (ref 7–25)
CO2: 28 mmol/L (ref 20–32)
Calcium: 9.2 mg/dL (ref 8.6–10.3)
Chloride: 105 mmol/L (ref 98–110)
Creat: 1.3 mg/dL — ABNORMAL HIGH (ref 0.70–1.28)
Globulin: 2.2 g/dL (ref 1.9–3.7)
Glucose, Bld: 95 mg/dL (ref 65–99)
Potassium: 4.7 mmol/L (ref 3.5–5.3)
Sodium: 139 mmol/L (ref 135–146)
Total Bilirubin: 0.6 mg/dL (ref 0.2–1.2)
Total Protein: 6.3 g/dL (ref 6.1–8.1)
eGFR: 58 mL/min/{1.73_m2} — ABNORMAL LOW (ref >=60)

## 2022-12-11 LAB — CBC WITH DIFFERENTIAL/PLATELET
Absolute Lymphocytes: 2538 {cells}/uL (ref 850–3900)
Absolute Monocytes: 978 {cells}/uL — ABNORMAL HIGH (ref 200–950)
Basophils Absolute: 75 {cells}/uL (ref 0–200)
Basophils Relative: 0.8 %
Eosinophils Absolute: 978 {cells}/uL — ABNORMAL HIGH (ref 15–500)
Eosinophils Relative: 10.4 %
HCT: 44.9 % (ref 38.5–50.0)
Hemoglobin: 14.7 g/dL (ref 13.2–17.1)
MCH: 28.2 pg (ref 27.0–33.0)
MCHC: 32.7 g/dL (ref 32.0–36.0)
MCV: 86 fL (ref 80.0–100.0)
MPV: 10.9 fL (ref 7.5–12.5)
Monocytes Relative: 10.4 %
Neutro Abs: 4832 {cells}/uL (ref 1500–7800)
Neutrophils Relative %: 51.4 %
Platelets: 301 10*3/uL (ref 140–400)
RBC: 5.22 10*6/uL (ref 4.20–5.80)
RDW: 12.7 % (ref 11.0–15.0)
Total Lymphocyte: 27 %
WBC: 9.4 10*3/uL (ref 3.8–10.8)

## 2022-12-11 LAB — SEDIMENTATION RATE: Sed Rate: 2 mm/h (ref 0–20)

## 2022-12-25 DIAGNOSIS — H903 Sensorineural hearing loss, bilateral: Secondary | ICD-10-CM | POA: Diagnosis not present

## 2022-12-25 DIAGNOSIS — J301 Allergic rhinitis due to pollen: Secondary | ICD-10-CM | POA: Diagnosis not present

## 2022-12-25 DIAGNOSIS — J324 Chronic pansinusitis: Secondary | ICD-10-CM | POA: Diagnosis not present

## 2022-12-25 DIAGNOSIS — M8500 Fibrous dysplasia (monostotic), unspecified site: Secondary | ICD-10-CM | POA: Diagnosis not present

## 2022-12-25 DIAGNOSIS — H90A22 Sensorineural hearing loss, unilateral, left ear, with restricted hearing on the contralateral side: Secondary | ICD-10-CM | POA: Diagnosis not present

## 2022-12-25 DIAGNOSIS — J342 Deviated nasal septum: Secondary | ICD-10-CM | POA: Diagnosis not present

## 2022-12-25 DIAGNOSIS — J329 Chronic sinusitis, unspecified: Secondary | ICD-10-CM | POA: Diagnosis not present

## 2022-12-26 ENCOUNTER — Other Ambulatory Visit: Payer: Self-pay | Admitting: Otolaryngology

## 2022-12-26 DIAGNOSIS — H918X3 Other specified hearing loss, bilateral: Secondary | ICD-10-CM

## 2022-12-31 DIAGNOSIS — I25119 Atherosclerotic heart disease of native coronary artery with unspecified angina pectoris: Secondary | ICD-10-CM | POA: Diagnosis not present

## 2022-12-31 DIAGNOSIS — J329 Chronic sinusitis, unspecified: Secondary | ICD-10-CM | POA: Diagnosis not present

## 2022-12-31 DIAGNOSIS — I509 Heart failure, unspecified: Secondary | ICD-10-CM | POA: Diagnosis not present

## 2022-12-31 DIAGNOSIS — F17200 Nicotine dependence, unspecified, uncomplicated: Secondary | ICD-10-CM | POA: Diagnosis not present

## 2022-12-31 DIAGNOSIS — G8929 Other chronic pain: Secondary | ICD-10-CM | POA: Diagnosis not present

## 2022-12-31 DIAGNOSIS — E785 Hyperlipidemia, unspecified: Secondary | ICD-10-CM | POA: Diagnosis not present

## 2022-12-31 DIAGNOSIS — E663 Overweight: Secondary | ICD-10-CM | POA: Diagnosis not present

## 2022-12-31 DIAGNOSIS — C61 Malignant neoplasm of prostate: Secondary | ICD-10-CM | POA: Diagnosis not present

## 2022-12-31 DIAGNOSIS — I10 Essential (primary) hypertension: Secondary | ICD-10-CM | POA: Diagnosis not present

## 2022-12-31 DIAGNOSIS — Z87891 Personal history of nicotine dependence: Secondary | ICD-10-CM | POA: Diagnosis not present

## 2022-12-31 DIAGNOSIS — I110 Hypertensive heart disease with heart failure: Secondary | ICD-10-CM | POA: Diagnosis not present

## 2022-12-31 DIAGNOSIS — R011 Cardiac murmur, unspecified: Secondary | ICD-10-CM | POA: Diagnosis not present

## 2023-01-07 DIAGNOSIS — N5231 Erectile dysfunction following radical prostatectomy: Secondary | ICD-10-CM | POA: Diagnosis not present

## 2023-01-17 ENCOUNTER — Other Ambulatory Visit: Payer: Self-pay

## 2023-01-17 NOTE — Telephone Encounter (Signed)
Prescription Request  01/17/2023  LOV: 11/28/22  What is the name of the medication or equipment? metoprolol succinate (TOPROL-XL) 25 MG 24 hr tablet [[id number]]   Have you contacted your pharmacy to request a refill? Yes   Which pharmacy would you like this sent to?  [facility] [address], [place] - [address] [place] [address] Phone: [phone] Fax: [phone]    Patient notified that their request is being sent to the clinical staff for review and that they should receive a response within 2 business days.   Please advise at [facility] [phone]

## 2023-01-21 ENCOUNTER — Ambulatory Visit
Admission: RE | Admit: 2023-01-21 | Discharge: 2023-01-21 | Disposition: A | Discharge status: Home / Self Care | Payer: PPO | Source: Ambulatory Visit | Attending: Otolaryngology | Admitting: Otolaryngology

## 2023-01-21 ENCOUNTER — Telehealth: Payer: Self-pay | Admitting: Family Medicine

## 2023-01-21 DIAGNOSIS — H918X3 Other specified hearing loss, bilateral: Secondary | ICD-10-CM

## 2023-01-21 DIAGNOSIS — H903 Sensorineural hearing loss, bilateral: Secondary | ICD-10-CM | POA: Diagnosis not present

## 2023-01-21 DIAGNOSIS — Z8546 Personal history of malignant neoplasm of prostate: Secondary | ICD-10-CM | POA: Diagnosis not present

## 2023-01-21 MED ORDER — GADOPICLENOL 0.5 MMOL/ML IV SOLN
10.0000 mL | Freq: Once | INTRAVENOUS | Status: AC | PRN
  Administered 2023-01-21: 10 mL via INTRAVENOUS

## 2023-01-21 NOTE — Telephone Encounter (Signed)
 Prescription Request  01/21/2023  LOV: 12/10/2022  What is the name of the medication or equipment? Cetirizine  hcl 10 mg tablet  Have you contacted your pharmacy to request a refill? Yes   Which pharmacy would you like this sent to?  [facility] [address], [place] - [address] [name] [place] [address] Phone: [phone] Fax: [phone]    Patient notified that their request is being sent to the clinical staff for review and that they should receive a response within 2 business days.   Please advise at [facility] [phone]

## 2023-01-21 NOTE — Telephone Encounter (Signed)
Cetirizine is not on pt. Medication list.

## 2023-01-22 MED ORDER — METOPROLOL SUCCINATE ER 25 MG PO TB24: 25.0000 mg | ORAL_TABLET | Freq: Every day | ORAL | 0 refills | Status: DC

## 2023-01-22 NOTE — Telephone Encounter (Signed)
 Requested Prescriptions  Pending Prescriptions Disp Refills   metoprolol  succinate (TOPROL -XL) 25 MG 24 hr tablet 90 tablet 0    Sig: Take 1 tablet (25 mg total) by mouth daily.     Cardiovascular:  Beta Blockers Failed - 01/22/2023 10:00 AM      Failed - Valid encounter within last 6 months    Recent Outpatient Visits           1 year ago Tinea corporis   [facility] Pickard, Butler DASEN, MD   1 year ago Viral upper respiratory tract infection   [facility] Duanne, Butler DASEN, MD   2 years ago Viral URI   [facility] Duanne, Butler DASEN, MD   2 years ago Chest pain, unspecified type   [facility] Duanne Butler DASEN, MD   2 years ago Strain of lumbar region, initial encounter   [facility] Pickard, Butler DASEN, MD       Future Appointments             In 2 months Agbor-Etang, Redell, MD [facility] at [facility] - Last BP in normal range    BP Readings from Last 1 Encounters:  12/10/22 138/78         Passed - Last Heart Rate in normal range    Pulse Readings from Last 1 Encounters:  12/10/22 72

## 2023-01-23 ENCOUNTER — Other Ambulatory Visit: Payer: Self-pay

## 2023-01-23 DIAGNOSIS — J329 Chronic sinusitis, unspecified: Secondary | ICD-10-CM

## 2023-01-23 DIAGNOSIS — J301 Allergic rhinitis due to pollen: Secondary | ICD-10-CM | POA: Diagnosis not present

## 2023-01-23 DIAGNOSIS — H90A22 Sensorineural hearing loss, unilateral, left ear, with restricted hearing on the contralateral side: Secondary | ICD-10-CM | POA: Diagnosis not present

## 2023-01-23 DIAGNOSIS — J3089 Other allergic rhinitis: Secondary | ICD-10-CM

## 2023-01-23 DIAGNOSIS — R0982 Postnasal drip: Secondary | ICD-10-CM

## 2023-01-23 MED ORDER — CETIRIZINE HCL 10 MG PO TABS: 10.0000 mg | ORAL_TABLET | Freq: Every day | ORAL | 2 refills | Status: AC

## 2023-01-23 NOTE — Telephone Encounter (Signed)
2nd request for this medication

## 2023-02-04 DIAGNOSIS — C495 Malignant neoplasm of connective and soft tissue of pelvis: Secondary | ICD-10-CM | POA: Diagnosis not present

## 2023-02-05 ENCOUNTER — Other Ambulatory Visit: Payer: Self-pay | Admitting: Neurosurgery

## 2023-02-05 DIAGNOSIS — D3610 Benign neoplasm of peripheral nerves and autonomic nervous system, unspecified: Secondary | ICD-10-CM

## 2023-02-10 DIAGNOSIS — C495 Malignant neoplasm of connective and soft tissue of pelvis: Secondary | ICD-10-CM | POA: Diagnosis not present

## 2023-02-10 DIAGNOSIS — N200 Calculus of kidney: Secondary | ICD-10-CM | POA: Diagnosis not present

## 2023-02-28 DIAGNOSIS — N393 Stress incontinence (female) (male): Secondary | ICD-10-CM | POA: Diagnosis not present

## 2023-02-28 DIAGNOSIS — N5231 Erectile dysfunction following radical prostatectomy: Secondary | ICD-10-CM | POA: Diagnosis not present

## 2023-02-28 DIAGNOSIS — C495 Malignant neoplasm of connective and soft tissue of pelvis: Secondary | ICD-10-CM | POA: Diagnosis not present

## 2023-03-18 ENCOUNTER — Ambulatory Visit
Admission: RE | Admit: 2023-03-18 | Discharge: 2023-03-18 | Disposition: A | Discharge status: Home / Self Care | Payer: PPO | Source: Ambulatory Visit | Attending: Neurosurgery | Admitting: Neurosurgery

## 2023-03-18 DIAGNOSIS — R222 Localized swelling, mass and lump, trunk: Secondary | ICD-10-CM | POA: Diagnosis not present

## 2023-03-18 DIAGNOSIS — Z9889 Other specified postprocedural states: Secondary | ICD-10-CM | POA: Diagnosis not present

## 2023-03-18 DIAGNOSIS — D3610 Benign neoplasm of peripheral nerves and autonomic nervous system, unspecified: Secondary | ICD-10-CM

## 2023-03-18 DIAGNOSIS — Z8546 Personal history of malignant neoplasm of prostate: Secondary | ICD-10-CM | POA: Diagnosis not present

## 2023-03-18 MED ORDER — GADOPICLENOL 0.5 MMOL/ML IV SOLN
10.0000 mL | Freq: Once | INTRAVENOUS | Status: AC | PRN
  Administered 2023-03-18: 10 mL via INTRAVENOUS

## 2023-03-25 ENCOUNTER — Encounter: Payer: Self-pay | Admitting: Family Medicine

## 2023-03-25 ENCOUNTER — Ambulatory Visit (INDEPENDENT_AMBULATORY_CARE_PROVIDER_SITE_OTHER): Admitting: Family Medicine

## 2023-03-25 VITALS — BP 122/62 | HR 60 | Temp 98.7°F | Ht 72.0 in | Wt 223.6 lb

## 2023-03-25 DIAGNOSIS — M545 Low back pain, unspecified: Secondary | ICD-10-CM

## 2023-03-25 DIAGNOSIS — G8929 Other chronic pain: Secondary | ICD-10-CM

## 2023-03-25 MED ORDER — TIZANIDINE HCL 4 MG PO TABS: 4.0000 mg | ORAL_TABLET | Freq: Three times a day (TID) | ORAL | 0 refills | Status: DC | PRN

## 2023-03-25 NOTE — Progress Notes (Signed)
 Subjective:    Patient ID: [name], male    DOB: 08-07-1950, 73 y.o.   MRN: [id number]  Patient has been having intermittent right-sided low back pain for 6 months.  An x-ray of 2019 showed degenerative disc disease especially L4-L5 and L5-S1.  Patient denies any recent injury.  He does state that when he twists to the right he will trigger muscle spasms in his lower back and exacerbate the pain.  He describes it as a crampy-like pain in his right flank that radiates to his right mid axillary line.  He denies any sciatica.  He denies any leg weakness.  He denies any saddle anesthesias or leg numbness.  He denies any hematuria or dysuria. Past Medical History:  Diagnosis Date   Adenomatous colon polyp 2004   Allergy    Anginal pain (HCC)    Arthritis    Diverticulosis 2011   Dyspnea    Former smoker    GERD (gastroesophageal reflux disease)    HLD (hyperlipidemia)    Hyperplastic colon polyp 2004   Hypertension    Melanoma in situ [facility])    Past Surgical History:  Procedure Laterality Date   COLONOSCOPY  01/21/2009   SEVERAL    CORONARY STENT INTERVENTION N/A 10/04/2020   Procedure: CORONARY STENT INTERVENTION;  Surgeon: Marykay Lex, MD;  [facility] INVASIVE CV LAB;  Service: Cardiovascular;  Laterality: N/A;   LAMINECTOMY N/A 08/14/2022   Procedure: THORACIC LAMINECTOMY FOR TUMOR;  Surgeon: [name], MD;  Location: [facility] OR;  Service: Neurosurgery;  Laterality: N/A;   LEFT HEART CATH AND CORONARY ANGIOGRAPHY N/A 10/04/2020   Procedure: LEFT HEART CATH AND CORONARY ANGIOGRAPHY;  Surgeon: Marykay Lex, MD;  [facility] INVASIVE CV LAB;  Service: Cardiovascular;  Laterality: N/A;   LEFT HEART CATH AND CORONARY ANGIOGRAPHY Left 11/30/2020   Procedure: LEFT HEART CATH AND CORONARY ANGIOGRAPHY;  Surgeon: Marykay Lex, MD;  Location: [facility] INVASIVE CV LAB;  Service: Cardiovascular;  Laterality: Left;   LYMPH NODE DISSECTION Bilateral 12/28/2021   Procedure: PELVIC  LYMPH NODE DISSECTION;  Surgeon: [name], MD;  Location: [facility] ORS;  Service: Urology;  Laterality: Bilateral;   ROBOT ASSISTED LAPAROSCOPIC RADICAL PROSTATECTOMY N/A 12/28/2021   Procedure: XI ROBOTIC ASSISTED LAPAROSCOPIC RADICAL PROSTATECTOMY AND INDOCYANINE GREEN DYE INJECTION;  Surgeon: [name], MD;  Location: [facility] ORS;  Service: Urology;  Laterality: N/A;  3 HRS   TOOTH EXTRACTION     TOTAL KNEE ARTHROPLASTY Right 04/26/2020   Current Outpatient Medications on File Prior to Visit  Medication Sig Dispense Refill   cetirizine (ZYRTEC) 10 MG tablet Take 1 tablet (10 mg total) by mouth daily. 90 tablet 2   clotrimazole (LOTRIMIN AF) 1 % cream Apply 1 Application topically 2 (two) times daily. 30 g 2   ezetimibe (ZETIA) 10 MG tablet TAKE 1 TABLET BY MOUTH EVERY DAY 90 tablet 3   losartan (COZAAR) 50 MG tablet Take 0.5 tablets (25 mg total) by mouth in the morning. (Patient taking differently: Take 50 mg by mouth in the morning. Pt states he is taking 50 mg daily-12/10/2022.) 45 tablet 1   meclizine (ANTIVERT) 12.5 MG tablet Take 1 tablet (12.5 mg total) by mouth 3 (three) times daily as needed for dizziness. 30 tablet 0   metoprolol succinate (TOPROL-XL) 25 MG 24 hr tablet Take 1 tablet (25 mg total) by mouth daily. 90 tablet 0   Multiple Vitamins-Minerals (CENTRUM SILVER 50+MEN) TABS Take 1 tablet by mouth daily.  nitroGLYCERIN (NITROSTAT) 0.4 MG SL tablet Place 1 tablet (0.4 mg total) under the tongue every 5 (five) minutes as needed for chest pain. 50 tablet 3   predniSONE (DELTASONE) 20 MG tablet 3 tabs poqday 1-2, 2 tabs poqday 3-4, 1 tab poqday 5-6 12 tablet 0   rosuvastatin (CRESTOR) 40 MG tablet Take 1 tablet (40 mg total) by mouth daily. 90 tablet 3   tiZANidine (ZANAFLEX) 4 MG tablet Take 1 tablet (4 mg total) by mouth every 6 (six) hours as needed for muscle spasms. 30 tablet 0   zolpidem (AMBIEN) 10 MG tablet Take 1 tablet (10 mg total) by mouth at bedtime as needed for  sleep. 30 tablet 1   No current facility-administered medications on file prior to visit.   Allergies  Allergen Reactions   Levaquin [Levofloxacin In D5w] Shortness Of Breath   Social History   Socioeconomic History   Marital status: Married    Spouse name: [name]   Number of children: 2   Years of education: Not on file   Highest education level: 12th grade  Occupational History   Occupation: part Investment banker, corporate: [employer]  Tobacco Use   Smoking status: Former    Current packs/day: 0.00    Average packs/day: 1 pack/day for 30.0 years (30.0 ttl pk-yrs)    Types: Cigarettes, Cigars    Start date: 05/12/1968    Quit date: 05/13/1998    Years since quitting: 24.8   Smokeless tobacco: Current    Types: Chew  Vaping Use   Vaping status: Never Used  Substance and Sexual Activity   Alcohol use: Yes    Alcohol/week: 2.0 standard drinks of alcohol    Types: 2 Cans of beer per week   Drug use: No   Sexual activity: Not Currently  Other Topics Concern   Not on file  Social History Narrative   Married x 51 years   2 grandsons   Social Drivers of Corporate investment banker Strain: Low Risk  (03/24/2023)   Overall Financial Resource Strain (CARDIA)    Difficulty of Paying Living Expenses: Not hard at all  Food Insecurity: No Food Insecurity (03/24/2023)   Hunger Vital Sign    Worried About Running Out of Food in the Last Year: Never true    Ran Out of Food in the Last Year: Never true  Transportation Needs: No Transportation Needs (03/24/2023)   PRAPARE - Administrator, Civil Service (Medical): No    Lack of Transportation (Non-Medical): No  Physical Activity: Insufficiently Active (03/24/2023)   Exercise Vital Sign    Days of Exercise per Week: 2 days    Minutes of Exercise per Session: 10 min  Stress: No Stress Concern Present (03/24/2023)   [employer] of Occupational Health - Occupational Stress Questionnaire    Feeling of Stress : Only a little  Social  Connections: Moderately Integrated (03/24/2023)   Social Connection and Isolation Panel [NHANES]    Frequency of Communication with Friends and Family: More than three times a week    Frequency of Social Gatherings with Friends and Family: Twice a week    Attends Religious Services: More than 4 times per year    Active Member of [employer] or Organizations: Yes    Attends Banker Meetings: More than 4 times per year    Marital Status: Widowed  Intimate Partner Violence: Not At Risk (11/14/2022)   Humiliation, Afraid, Rape, and Kick questionnaire  Fear of Current or Ex-Partner: No    Emotionally Abused: No    Physically Abused: No    Sexually Abused: No      Review of Systems  All other systems reviewed and are negative.      Objective:   Physical Exam Vitals reviewed.  Constitutional:      General: He is not in acute distress.    Appearance: He is well-developed. He is not diaphoretic.  HENT:     Head: Normocephalic and atraumatic.     Jaw: No trismus, tenderness or pain on movement.      Nose: No mucosal edema.     Right Sinus: No maxillary sinus tenderness or frontal sinus tenderness.     Left Sinus: No maxillary sinus tenderness or frontal sinus tenderness.  Eyes:     General: No visual field deficit. Cardiovascular:     Rate and Rhythm: Normal rate and regular rhythm.     Heart sounds: Normal heart sounds.  Pulmonary:     Effort: Pulmonary effort is normal. No respiratory distress.     Breath sounds: No wheezing, rhonchi or rales.  Musculoskeletal:     Cervical back: Neck supple. Decreased range of motion.       Back:  Lymphadenopathy:     Cervical: No cervical adenopathy.  Skin:    Findings: No erythema or rash.  Neurological:     General: No focal deficit present.     Mental Status: He is alert and oriented to person, place, and time.     Cranial Nerves: No cranial nerve deficit, dysarthria or facial asymmetry.     Sensory: Sensation is intact. No  sensory deficit.     Motor: Motor function is intact. No weakness, atrophy, abnormal muscle tone, seizure activity or pronator drift.     Coordination: Coordination is intact.     Gait: Gait is intact.           Assessment & Plan:  Chronic midline low back pain without sciatica - Plan: DG Lumbar Spine Complete I believe the patient is dealing with muscle spasms related to his degenerative disc disease.  Obtain an x-ray of the lumbar spine to evaluate further to see if the situation has worsened.  Start with tizanidine 4 mg every 8 hours as needed for muscle spasms and pain.  If the muscle relaxers are not helping, consider physical therapy.  At the present time there is no indication of nerve impingement

## 2023-04-15 DIAGNOSIS — Z6830 Body mass index (BMI) 30.0-30.9, adult: Secondary | ICD-10-CM | POA: Diagnosis not present

## 2023-04-15 DIAGNOSIS — D3610 Benign neoplasm of peripheral nerves and autonomic nervous system, unspecified: Secondary | ICD-10-CM | POA: Diagnosis not present

## 2023-04-16 ENCOUNTER — Other Ambulatory Visit: Payer: Self-pay | Admitting: Family Medicine

## 2023-04-16 NOTE — Telephone Encounter (Signed)
 Requested medication (s) are due for refill today- no  Requested medication (s) are on the active medication list -yes  Future visit scheduled -no  Last refill: 03/25/23 #90  Notes to clinic: non delegated rx- requesting 90 day supply  Requested Prescriptions  Pending Prescriptions Disp Refills   tiZANidine (ZANAFLEX) 4 MG tablet [Pharmacy Med Name: TIZANIDINE HCL 4 MG TABLET] 270 tablet 1    Sig: Take 1 tablet (4 mg total) by mouth every 8 (eight) hours as needed for muscle spasms.     Not Delegated - Cardiovascular:  Alpha-2 Agonists - tizanidine Failed - 04/16/2023  3:45 PM      Failed - This refill cannot be delegated      Passed - Valid encounter within last 6 months    Recent Outpatient Visits           3 weeks ago Chronic midline low back pain without sciatica   [place] [facility] [name], MD   4 months ago Acute intractable tension-type headache   [place] [facility] Tanya Nones, Priscille Heidelberg, MD   4 months ago Primary insomnia   [place] [facility] Tanya Nones, Priscille Heidelberg, MD   5 months ago Rhinosinusitis   [place] [facility] [name], MD   7 months ago Chest pain, unspecified type   [place] [facility] [name], MD       Future Appointments             In 1 month [name], MD [facility]Care at [facility]               Requested Prescriptions  Pending Prescriptions Disp Refills   tiZANidine (ZANAFLEX) 4 MG tablet [Pharmacy Med Name: TIZANIDINE HCL 4 MG TABLET] 270 tablet 1    Sig: Take 1 tablet (4 mg total) by mouth every 8 (eight) hours as needed for muscle spasms.     Not Delegated - Cardiovascular:  Alpha-2 Agonists - tizanidine Failed - 04/16/2023  3:45 PM      Failed - This refill cannot be delegated      Passed - Valid encounter within last 6 months    Recent Outpatient Visits           3 weeks ago Chronic  midline low back pain without sciatica   [place] [facility] [name], MD   4 months ago Acute intractable tension-type headache   [place] [facility] Tanya Nones, Priscille Heidelberg, MD   4 months ago Primary insomnia   [place] [facility] Tanya Nones, Priscille Heidelberg, MD   5 months ago Rhinosinusitis   [facility] [name], MD   7 months ago Chest pain, unspecified type   [place] [facility] Pickard, Priscille Heidelberg, MD       Future Appointments             In 1 month Agbor-Etang, Arlys John, MD [facility] at [facility]

## 2023-04-18 ENCOUNTER — Ambulatory Visit: Payer: PPO | Admitting: Cardiology

## 2023-04-23 ENCOUNTER — Other Ambulatory Visit: Payer: Self-pay | Admitting: Family Medicine

## 2023-04-24 NOTE — Telephone Encounter (Signed)
 Requested Prescriptions  Pending Prescriptions Disp Refills   metoprolol succinate (TOPROL-XL) 25 MG 24 hr tablet [Pharmacy Med Name: METOPROLOL SUCC ER 25 MG TAB] 90 tablet 0    Sig: TAKE 1 TABLET (25 MG TOTAL) BY MOUTH DAILY.     Cardiovascular:  Beta Blockers Passed - 04/24/2023  1:40 PM      Passed - Last BP in normal range    BP Readings from Last 1 Encounters:  03/25/23 122/62         Passed - Last Heart Rate in normal range    Pulse Readings from Last 1 Encounters:  03/25/23 60         Passed - Valid encounter within last 6 months    Recent Outpatient Visits           1 month ago Chronic midline low back pain without sciatica   [facility] 2 Medicine [name], MD   4 months ago Acute intractable tension-type headache   [facility] Tanya Nones, Priscille Heidelberg, MD   4 months ago Primary insomnia   [facility] Tanya Nones, Priscille Heidelberg, MD   5 months ago Rhinosinusitis   [place] [name], MD   7 months ago Chest pain, unspecified type   [facility] Pickard, Priscille Heidelberg, MD       Future Appointments             In 4 weeks Agbor-Etang, Arlys John, MD [facility] at [facility]

## 2023-04-29 ENCOUNTER — Ambulatory Visit: Admitting: Cardiology

## 2023-05-14 ENCOUNTER — Encounter: Payer: Self-pay | Admitting: Family Medicine

## 2023-05-21 ENCOUNTER — Ambulatory Visit: Admitting: Family Medicine

## 2023-05-23 ENCOUNTER — Ambulatory Visit: Attending: Cardiology | Admitting: Cardiology

## 2023-05-23 ENCOUNTER — Ambulatory Visit: Admitting: Cardiology

## 2023-05-23 ENCOUNTER — Encounter: Payer: Self-pay | Admitting: Cardiology

## 2023-05-23 VITALS — BP 136/80 | HR 67 | Ht 72.0 in | Wt 217.6 lb

## 2023-05-23 DIAGNOSIS — I10 Essential (primary) hypertension: Secondary | ICD-10-CM | POA: Diagnosis not present

## 2023-05-23 DIAGNOSIS — I2510 Atherosclerotic heart disease of native coronary artery without angina pectoris: Secondary | ICD-10-CM | POA: Diagnosis not present

## 2023-05-23 DIAGNOSIS — E782 Mixed hyperlipidemia: Secondary | ICD-10-CM

## 2023-05-23 DIAGNOSIS — R011 Cardiac murmur, unspecified: Secondary | ICD-10-CM

## 2023-05-23 NOTE — Progress Notes (Signed)
 Cardiology Office Note:    Date:  05/23/2023   ID:  [name], DOB [birth year], MRN [id number]  PCP:  Austine Lefort, MD   [facility] Providers Cardiologist:  Constancia Delton, MD     Referring MD: Austine Lefort, MD   Chief Complaint  Patient presents with   Follow-up    6 month follow up visit. Patient is doing ok on today. Meds reviewed.     History of Present Illness:    [name] is a 73 y.o. male with a hx of CAD s/p PCI p LAD, 09/2020, angina, hypertension, hyperlipidemia, former smoker x30 years who presents for follow-up.  Feels well, denies symptoms of chest pain.  Angina well-controlled with Imdur .  Previously had easy bruisability with Brilinta , this was stopped, bruising symptoms have significantly improved.  Taking all medications as prescribed.  Prior notes Echo 09/28/2020 EF 60 to 65%, impaired relaxation. Repeat LHC 11/2020 patent mid LAD stent, 75% proximal RCA, nondominant vessel. Left heart cath 10/04/2020 proximal to mid LAD 80% stenosis, s/p drug-eluting stent.  RCA 75% stenosis, small nondominant.  Not PCI target. Coronary CTA 09/2020, calcium  score 728, significant proximal LAD disease.  Moderate proximal RCA disease.   Past Medical History:  Diagnosis Date   Adenomatous colon polyp 2004   Allergy    Anginal pain (HCC)    Arthritis    Diverticulosis 2011   Dyspnea    Former smoker    GERD (gastroesophageal reflux disease)    HLD (hyperlipidemia)    Hyperplastic colon polyp 2004   Hypertension    Melanoma in situ [facility])     Past Surgical History:  Procedure Laterality Date   COLONOSCOPY  01/21/2009   SEVERAL    CORONARY STENT INTERVENTION N/A 10/04/2020   Procedure: CORONARY STENT INTERVENTION;  Surgeon: Arleen Lacer, MD;  [facility] INV[facility] CV LAB;  Service: Cardiovascular;  Laterality: N/A;   LAMINECTOMY N/A 08/14/2022   Procedure: THORACIC LAMINECTOMY FOR TUMOR;  Surgeon: Audie Bleacher, MD;  [facility]: [facility] OR;  Service:  Neurosurgery;  Laterality: N/A;   LEFT HEART CATH AND CORONARY ANGIOGRAPHY N/A 10/04/2020   Procedure: LEFT HEART CATH AND CORONARY ANGIOGRAPHY;  Surgeon: Arleen Lacer, MD;  Location: [facility] INVASIVE CV LAB;  Service: Cardiovascular;  Laterality: N/A;   LEFT HEART CATH AND CORONARY ANGIOGRAPHY Left 11/30/2020   Procedure: LEFT HEART CATH AND CORONARY ANGIOGRAPHY;  Surgeon: Arleen Lacer, MD;  Location: [facility] INVASIVE CV LAB;  Service: Cardiovascular;  Laterality: Left;   LYMPH NODE DISSECTION Bilateral 12/28/2021   Procedure: PELVIC LYMPH NODE DISSECTION;  Surgeon: Osborn Blaze, MD;  Location: [facility] ORS;  Service: Urology;  Laterality: Bilateral;   ROBOT ASSISTED LAPAROSCOPIC RADICAL PROSTATECTOMY N/A 12/28/2021   Procedure: XI ROBOTIC ASSISTED LAPAROSCOPIC RADICAL PROSTATECTOMY AND INDOCYANINE GREEN DYE INJECTION;  Surgeon: Osborn Blaze, MD;  Location: [facility] ORS;  Service: Urology;  Laterality: N/A;  3 HRS   TOOTH EXTRACTION     TOTAL KNEE ARTHROPLASTY Right 04/26/2020    Current Medications: Current Meds  Medication Sig   clotrimazole  (LOTRIMIN  AF) 1 % cream Apply 1 Application topically 2 (two) times daily.   losartan  (COZAAR ) 50 MG tablet Take 0.5 tablets (25 mg total) by mouth in the morning.   metoprolol  succinate (TOPROL -XL) 25 MG 24 hr tablet TAKE 1 TABLET (25 MG TOTAL) BY MOUTH DAILY.   Multiple Vitamins-Minerals (CENTRUM SILVER 50+MEN) TABS Take 1 tablet by mouth daily.   nitroGLYCERIN  (NITROSTAT ) 0.4 MG SL tablet Place  1 tablet (0.4 mg total) under the tongue every 5 (five) minutes as needed for chest pain.   rosuvastatin  (CRESTOR ) 40 MG tablet Take 1 tablet (40 mg total) by mouth daily.   tiZANidine  (ZANAFLEX ) 4 MG tablet Take 1 tablet (4 mg total) by mouth every 6 (six) hours as needed for muscle spasms.     Allergies:   Levaquin [levofloxacin in d5w]   Social History   Socioeconomic History   Marital status: Married    Spouse name: [name]   Number of children: 2   Years  of education: Not on file   Highest education level: 12th grade  Occupational History   Occupation: part Investment banker, corporate: [employer]  Tobacco Use   Smoking status: Former    Current packs/day: 0.00    Average packs/day: 1 pack/day for 30.0 years (30.0 ttl pk-yrs)    Types: Cigarettes, Cigars    Start date: 05/12/1968    Quit date: 05/13/1998    Years since quitting: 25.0   Smokeless tobacco: Current    Types: Chew  Vaping Use   Vaping status: Never Used  Substance and Sexual Activity   Alcohol use: Yes    Alcohol/week: 2.0 standard drinks of alcohol    Types: 2 Cans of beer per week   Drug use: No   Sexual activity: Not Currently  Other Topics Concern   Not on file  Social History Narrative   Married x 51 years   2 grandsons   Social Drivers of Corporate investment banker Strain: Low Risk  (03/24/2023)   Overall Financial Resource Strain (CARDIA)    Difficulty of Paying Living Expenses: Not hard at all  Food Insecurity: No Food Insecurity (03/24/2023)   Hunger Vital Sign    Worried About Running Out of Food in the Last Year: Never true    Ran Out of Food in the Last Year: Never true  Transportation Needs: No Transportation Needs (03/24/2023)   PRAPARE - Administrator, Civil Service (Medical): No    Lack of Transportation (Non-Medical): No  Physical Activity: Insufficiently Active (03/24/2023)   Exercise Vital Sign    Days of Exercise per Week: 2 days    Minutes of Exercise per Session: 10 min  Stress: No Stress Concern Present (03/24/2023)   [employer] of Occupational Health - Occupational Stress Questionnaire    Feeling of Stress : Only a little  Social Connections: Moderately Integrated (03/24/2023)   Social Connection and Isolation Panel [NHANES]    Frequency of Communication with Friends and Family: More than three times a week    Frequency of Social Gatherings with Friends and Family: Twice a week    Attends Religious Services: More than 4 times per year     Active Member of [employer] or Organizations: Yes    Attends Banker Meetings: More than 4 times per year    Marital Status: Widowed     Family History: The patient's family history includes Diabetes in his daughter; Heart disease in his mother; Lung cancer in his father.  ROS:   Please see the history of present illness.     All other systems reviewed and are negative.  EKGs/Labs/Other Studies Reviewed:    The following studies were reviewed today:   EKG:  EKG is ordered today.  EKG shows normal sinus rhythm, normal ECG   Recent Labs: 12/10/2022: ALT 13; BUN 18; Creat 1.30; Hemoglobin 14.7; Platelets 301; Potassium 4.7; Sodium 139  Recent Lipid Panel    Component Value Date/Time   CHOL 93 06/06/2021 0704   TRIG 70 06/06/2021 0704   HDL 36 (L) 06/06/2021 0704   CHOLHDL 2.6 06/06/2021 0704   VLDL 14 06/06/2021 0704   LDLCALC 43 06/06/2021 0704   LDLCALC 86 09/12/2020 1611     Risk Assessment/Calculations:          Physical Exam:    VS:  BP 136/80   Pulse 67   Ht 6' (1.829 m)   Wt 217 lb 9.6 oz (98.7 kg)   SpO2 96%   BMI 29.51 kg/m     Wt Readings from Last 3 Encounters:  05/23/23 217 lb 9.6 oz (98.7 kg)  03/25/23 223 lb 9.6 oz (101.4 kg)  12/10/22 220 lb (99.8 kg)     GEN:  Well nourished, well developed in no acute distress HEENT: Normal NECK: No JVD; No carotid bruits CARDIAC: RRR, 2/6 systolic murmur on left sternal border RESPIRATORY: Breath sounds appear diminished at bases, clear anteriorly ABDOMEN: Soft, non-tender, non-distended MUSCULOSKELETAL:  No edema; No deformity  SKIN: Warm and dry NEUROLOGIC:  Alert and oriented x 3 PSYCHIATRIC:  Normal affect   ASSESSMENT:    1. Coronary artery disease involving native coronary artery of native heart, unspecified whether angina present   2. Hyperlipidemia, mixed   3. Essential hypertension   4. Systolic murmur    PLAN:    In order of problems listed above:  CAD, s/p DES to  proximal LAD 09/2020.  Repeat LHC 11/2020 patent mid LAD stent, 75% proximal RCA, nondominant vessel.  echo with preserved EF 60 to 65% impaired relaxation.  Denies chest pain.  Advised to start aspirin  81 mg daily, continue Crestor  40 mg daily,Toprol -XL 25 mg daily. Hyperlipidemia, continue Crestor  40 mg daily. Hypertension, BP controlled.  Continue losartan  25 mg daily, Toprol -XL 25 mg daily. Systolic murmur, previous echo 2022 with aortic valve sclerosis.  Repeat echo to evaluate any progression of aortic valve calcification.  Follow-up in 12 months or earlier if significant abnormalities noted on echo.  Medication Adjustments/Labs and Tests Ordered: Current medicines are reviewed at length with the patient today.  Concerns regarding medicines are outlined above.  Orders Placed This Encounter  Procedures   EKG 12-Lead   ECHOCARDIOGRAM COMPLETE       No orders of the defined types were placed in this encounter.    Signed, Constancia Delton, MD  05/23/2023 3:11 PM    [facility]

## 2023-05-23 NOTE — Patient Instructions (Signed)
 Medication Instructions:  Your Physician recommend you continue on your current medication as directed.    *If you need a refill on your cardiac medications before your next appointment, please call your pharmacy*  Lab Work: No labs ordered today  If you have labs (blood work) drawn today and your tests are completely normal, you will receive your results only by: MyChart Message (if you have MyChart) OR A paper copy in the mail If you have any lab test that is abnormal or we need to change your treatment, we will call you to review the results.  Testing/Procedures: Your physician has requested that you have an echocardiogram. Echocardiography is a painless test that uses sound waves to create images of your heart. It provides your doctor with information about the size and shape of your heart and how well your heart's chambers and valves are working.   You may receive an ultrasound enhancing agent through an IV if needed to better visualize your heart during the echo. This procedure takes approximately one hour.  There are no restrictions for this procedure.  This will take place at [address] [facility] [address]) [address], [place] [address]  Please note: We ask at that you not bring children with you during ultrasound (echo/ vascular) testing. Due to room size and safety concerns, children are not allowed in the ultrasound rooms during exams. Our front office staff cannot provide observation of children in our lobby area while testing is being conducted. An adult accompanying a patient to their appointment will only be allowed in the ultrasound room at the discretion of the ultrasound technician under special circumstances. We apologize for any inconvenience.   Follow-Up: At [facility], you and your health needs are our priority.  As part of our continuing mission to provide you with exceptional heart care, our providers are all part of one team.  This team includes your  primary Cardiologist (physician) and Advanced Practice Providers or APPs (Physician Assistants and Nurse Practitioners) who all work together to provide you with the care you need, when you need it.  Your next appointment:   1 year(s)  Provider:   You may see Michael Delton, MD or one of the following Advanced Practice Providers on your designated Care Team:   Michael Pintos, NP Michael Labrador, PA-C Michael Gentleman, PA-C Michael Exeter, PA-C Michael Cockayne, NP Michael Ar, NP    We recommend signing up for the patient portal called "MyChart".  Sign up information is provided on this After Visit Summary.  MyChart is used to connect with patients for Virtual Visits (Telemedicine).  Patients are able to view lab/test results, encounter notes, upcoming appointments, etc.  Non-urgent messages can be sent to your provider as well.   To learn more about what you can do with MyChart, go to [email or web address].

## 2023-05-27 ENCOUNTER — Ambulatory Visit (INDEPENDENT_AMBULATORY_CARE_PROVIDER_SITE_OTHER): Admitting: Family Medicine

## 2023-05-27 ENCOUNTER — Encounter: Payer: Self-pay | Admitting: Family Medicine

## 2023-05-27 VITALS — BP 120/62 | HR 61 | Temp 97.6°F | Ht 72.0 in | Wt 217.6 lb

## 2023-05-27 DIAGNOSIS — L821 Other seborrheic keratosis: Secondary | ICD-10-CM | POA: Diagnosis not present

## 2023-05-27 NOTE — Progress Notes (Signed)
 Subjective:    Patient ID: [name], male    DOB: 1950-10-25, 73 y.o.   MRN: [id number] Patient has a seborrheic keratosis on his left shoulder/deltoid that he would like removed.  It is approximately 4 mm diameter.  It is a warty brown papule with sharp margins and defined borders.  There is no concerning features.  It is not irritated and red.  I explained to the patient that this is not a cancer and is not having any concerning features for cancer.  Patient declines a biopsy but would like me to remove it for convenience as it often is irritated by pressure Past Medical History:  Diagnosis Date   Adenomatous colon polyp 2004   Allergy    Anginal pain (HCC)    Arthritis    Diverticulosis 2011   Dyspnea    Former smoker    GERD (gastroesophageal reflux disease)    HLD (hyperlipidemia)    Hyperplastic colon polyp 2004   Hypertension    Melanoma in situ [facility])    Past Surgical History:  Procedure Laterality Date   COLONOSCOPY  01/21/2009   SEVERAL    CORONARY STENT INTERVENTION N/A 10/04/2020   Procedure: CORONARY STENT INTERVENTION;  Surgeon: Arleen Lacer, MD;  [facility] INVASIVE CV LAB;  Service: Cardiovascular;  Laterality: N/A;   LAMINECTOMY N/A 08/14/2022   Procedure: THORACIC LAMINECTOMY FOR TUMOR;  Surgeon: Audie Bleacher, MD;  Location: [facility] OR;  Service: Neurosurgery;  Laterality: N/A;   LEFT HEART CATH AND CORONARY ANGIOGRAPHY N/A 10/04/2020   Procedure: LEFT HEART CATH AND CORONARY ANGIOGRAPHY;  Surgeon: Arleen Lacer, MD;  [facility] INVASIVE CV LAB;  Service: Cardiovascular;  Laterality: N/A;   LEFT HEART CATH AND CORONARY ANGIOGRAPHY Left 11/30/2020   Procedure: LEFT HEART CATH AND CORONARY ANGIOGRAPHY;  Surgeon: Arleen Lacer, MD;  Location: [facility] INVASIVE CV LAB;  Service: Cardiovascular;  Laterality: Left;   LYMPH NODE DISSECTION Bilateral 12/28/2021   Procedure: PELVIC LYMPH NODE DISSECTION;  Surgeon: Osborn Blaze, MD;  [facility]: [facility] ORS;  Service:  Urology;  Laterality: Bilateral;   ROBOT ASSISTED LAPAROSCOPIC RADICAL PROSTATECTOMY N/A 12/28/2021   Procedure: XI ROBOTIC ASSISTED LAPAROSCOPIC RADICAL PROSTATECTOMY AND INDOCYANINE GREEN DYE INJECTION;  Surgeon: Osborn Blaze, MD;  Location: [facility] ORS;  Service: Urology;  Laterality: N/A;  3 HRS   TOOTH EXTRACTION     TOTAL KNEE ARTHROPLASTY Right 04/26/2020   Current Outpatient Medications on File Prior to Visit  Medication Sig Dispense Refill   clotrimazole  (LOTRIMIN  AF) 1 % cream Apply 1 Application topically 2 (two) times daily. 30 g 2   losartan  (COZAAR ) 50 MG tablet Take 0.5 tablets (25 mg total) by mouth in the morning. 45 tablet 1   metoprolol  succinate (TOPROL -XL) 25 MG 24 hr tablet TAKE 1 TABLET (25 MG TOTAL) BY MOUTH DAILY. 90 tablet 0   Multiple Vitamins-Minerals (CENTRUM SILVER 50+MEN) TABS Take 1 tablet by mouth daily.     nitroGLYCERIN  (NITROSTAT ) 0.4 MG SL tablet Place 1 tablet (0.4 mg total) under the tongue every 5 (five) minutes as needed for chest pain. 50 tablet 3   rosuvastatin  (CRESTOR ) 40 MG tablet Take 1 tablet (40 mg total) by mouth daily. 90 tablet 3   tiZANidine  (ZANAFLEX ) 4 MG tablet Take 1 tablet (4 mg total) by mouth every 6 (six) hours as needed for muscle spasms. 30 tablet 0   cetirizine  (ZYRTEC ) 10 MG tablet Take 1 tablet (10 mg total) by mouth daily. (Patient not taking:  Reported on 05/27/2023) 90 tablet 2   zolpidem  (AMBIEN ) 10 MG tablet Take 1 tablet (10 mg total) by mouth at bedtime as needed for sleep. 30 tablet 1   No current facility-administered medications on file prior to visit.   Allergies  Allergen Reactions   Levaquin [Levofloxacin In D5w] Shortness Of Breath   Social History   Socioeconomic History   Marital status: Married    Spouse name: [name]   Number of children: 2   Years of education: Not on file   Highest education level: 12th grade  Occupational History   Occupation: part Investment banker, corporate: [employer]  Tobacco Use   Smoking  status: Former    Current packs/day: 0.00    Average packs/day: 1 pack/day for 30.0 years (30.0 ttl pk-yrs)    Types: Cigarettes, Cigars    Start date: 05/12/1968    Quit date: 05/13/1998    Years since quitting: 25.0   Smokeless tobacco: Current    Types: Chew  Vaping Use   Vaping status: Never Used  Substance and Sexual Activity   Alcohol use: Yes    Alcohol/week: 2.0 standard drinks of alcohol    Types: 2 Cans of beer per week   Drug use: No   Sexual activity: Not Currently  Other Topics Concern   Not on file  Social History Narrative   Married x 51 years   2 grandsons   Social Drivers of Corporate investment banker Strain: Low Risk  (03/24/2023)   Overall Financial Resource Strain (CARDIA)    Difficulty of Paying Living Expenses: Not hard at all  Food Insecurity: No Food Insecurity (03/24/2023)   Hunger Vital Sign    Worried About Running Out of Food in the Last Year: Never true    Ran Out of Food in the Last Year: Never true  Transportation Needs: No Transportation Needs (03/24/2023)   PRAPARE - Administrator, Civil Service (Medical): No    Lack of Transportation (Non-Medical): No  Physical Activity: Insufficiently Active (03/24/2023)   Exercise Vital Sign    Days of Exercise per Week: 2 days    Minutes of Exercise per Session: 10 min  Stress: No Stress Concern Present (03/24/2023)   [employer] of Occupational Health - Occupational Stress Questionnaire    Feeling of Stress : Only a little  Social Connections: Moderately Integrated (03/24/2023)   Social Connection and Isolation Panel [NHANES]    Frequency of Communication with Friends and Family: More than three times a week    Frequency of Social Gatherings with Friends and Family: Twice a week    Attends Religious Services: More than 4 times per year    Active Member of [employer] or Organizations: Yes    Attends Banker Meetings: More than 4 times per year    Marital Status: Widowed  Intimate  Partner Violence: Not At Risk (11/14/2022)   Humiliation, Afraid, Rape, and Kick questionnaire    Fear of Current or Ex-Partner: No    Emotionally Abused: No    Physically Abused: No    Sexually Abused: No      Review of Systems  All other systems reviewed and are negative.      Objective:   Physical Exam Vitals reviewed.  Constitutional:      General: He is not in acute distress.    Appearance: He is well-developed. He is not diaphoretic.  HENT:     Head: Normocephalic and atraumatic.  Nose: No mucosal edema.     Right Sinus: No maxillary sinus tenderness or frontal sinus tenderness.     Left Sinus: No maxillary sinus tenderness or frontal sinus tenderness.  Eyes:     General: No visual field deficit. Cardiovascular:     Rate and Rhythm: Normal rate and regular rhythm.     Heart sounds: Normal heart sounds.  Pulmonary:     Effort: Pulmonary effort is normal. No respiratory distress.     Breath sounds: No wheezing, rhonchi or rales.  Musculoskeletal:       Arms:  Skin:    Findings: Lesion present.  Neurological:     Mental Status: He is alert.     Cranial Nerves: No dysarthria or facial asymmetry.     Sensory: Sensation is intact.     Motor: Motor function is intact. No atrophy, abnormal muscle tone, seizure activity or pronator drift.     Coordination: Coordination is intact.     Gait: Gait is intact.           Assessment & Plan:  Seborrheic keratosis I anesthetized the lesion with 0.1% lidocaine  with epinephrine .  I removed the lesion using a razor blade achieved hemostasis with Drysol and a Band-Aid.  Patient tolerated the procedure without complication.

## 2023-06-09 DIAGNOSIS — N393 Stress incontinence (female) (male): Secondary | ICD-10-CM | POA: Diagnosis not present

## 2023-06-09 DIAGNOSIS — N5231 Erectile dysfunction following radical prostatectomy: Secondary | ICD-10-CM | POA: Diagnosis not present

## 2023-06-09 DIAGNOSIS — C495 Malignant neoplasm of connective and soft tissue of pelvis: Secondary | ICD-10-CM | POA: Diagnosis not present

## 2023-07-02 ENCOUNTER — Ambulatory Visit: Attending: Cardiology

## 2023-07-02 DIAGNOSIS — R011 Cardiac murmur, unspecified: Secondary | ICD-10-CM | POA: Diagnosis not present

## 2023-07-02 LAB — ECHOCARDIOGRAM COMPLETE
AR max vel: 2.76 cm2
AV Area VTI: 2.64 cm2
AV Area mean vel: 2.66 cm2
AV Mean grad: 3 mmHg
AV Peak grad: 5.5 mmHg
Ao pk vel: 1.17 m/s
Area-P 1/2: 3.65 cm2
S' Lateral: 3.81 cm

## 2023-07-04 ENCOUNTER — Ambulatory Visit: Payer: Self-pay | Admitting: Cardiology

## 2023-07-30 ENCOUNTER — Other Ambulatory Visit: Payer: Self-pay

## 2023-07-30 ENCOUNTER — Telehealth: Payer: Self-pay | Admitting: Family Medicine

## 2023-07-30 MED ORDER — METOPROLOL SUCCINATE ER 25 MG PO TB24: 25.0000 mg | ORAL_TABLET | Freq: Every day | ORAL | 0 refills | Status: AC

## 2023-07-30 NOTE — Telephone Encounter (Signed)
 Medication sent in.

## 2023-07-30 NOTE — Telephone Encounter (Signed)
 Prescription Request  07/30/2023  LOV: 05/27/2023  What is the name of the medication or equipment?   metoprolol  succinate (TOPROL -XL) 25 MG 24 hr tablet [[id number]]   Have you contacted your pharmacy to request a refill? Yes   Which pharmacy would you like this sent to?  [facility] [address], [place] - [address] [place] [address] Phone: [phone] Fax: [phone]    Patient notified that their request is being sent to the clinical staff for review and that they should receive a response within 2 business days.   Please advise pharmacist.

## 2023-08-01 ENCOUNTER — Ambulatory Visit: Admitting: Family Medicine

## 2023-08-01 ENCOUNTER — Ambulatory Visit: Payer: Self-pay

## 2023-08-01 ENCOUNTER — Encounter: Payer: Self-pay | Admitting: Family Medicine

## 2023-08-01 VITALS — BP 119/70 | HR 76 | Temp 98.5°F | Ht 72.0 in | Wt 219.0 lb

## 2023-08-01 DIAGNOSIS — M545 Low back pain, unspecified: Secondary | ICD-10-CM

## 2023-08-01 MED ORDER — OXYCODONE-ACETAMINOPHEN 5-325 MG PO TABS: 1.0000 | ORAL_TABLET | ORAL | 0 refills | Status: AC | PRN

## 2023-08-01 MED ORDER — PREDNISONE 20 MG PO TABS: ORAL_TABLET | ORAL | 0 refills | Status: DC

## 2023-08-01 NOTE — Telephone Encounter (Addendum)
 FYI Only or Action Required?: FYI only for provider.  Patient was last seen in primary care on 05/27/2023 by Duanne Butler DASEN, MD.  Called Nurse Triage reporting Back Pain and Tingling.  Symptoms began several days ago.  Interventions attempted: Prescription medications: pills from Dr. Duanne and Rest, hydration, or home remedies.  Symptoms are: gradually worsening.  Triage Disposition: See HCP Within 4 Hours (Or PCP Triage)  Patient/caregiver understands and will follow disposition?: Yes    Advised pt call back if any worsening pain/tingling or new symptoms like weakness.  Copied from CRM [id number]. Topic: Clinical - Red Word Triage >> Aug 01, 2023  8:21 AM Charlet HERO wrote: Red Word that prompted transfer to Nurse Triage: Patient is calling about severe back pain. He has not been able to sleep it is on  the right side of his lower back.     Reason for Disposition  [1] SEVERE back pain (e.g., excruciating, unable to do any normal activities) AND [2] not improved 2 hours after pain medicine  Answer Assessment - Initial Assessment Questions 1. ONSET: When did the pain begin? (e.g., minutes, hours, days)     Off and on all week but yesterday got bad and hasn't gotten any better 2. LOCATION: Where does it hurt? (upper, mid or lower back)     Lower back right above my beltline on right side only 3. SEVERITY: How bad is the pain?  (e.g., Scale 1-10; mild, moderate, or severe)     7-8/10 pain Kept me up most night last night 4. PATTERN: Is the pain constant? (e.g., yes, no; constant, intermittent)      Constant since yesterday 5. RADIATION: Does the pain shoot into your legs or somewhere else?     Hard to walk or anything, pain shooting down right leg every once in a while 7. BACK OVERUSE:  Any recent lifting of heavy objects, strenuous work or exercise?     no 8. MEDICINES: What have you taken so far for the pain? (e.g., nothing, acetaminophen , NSAIDS)     Been  taking some pills that Dr. Duanne prescribed for me for pain, will ease it off some for a little bit but as soon as start wearing off 9. NEUROLOGIC SYMPTOMS: Do you have any weakness, numbness, or problems with bowel/bladder control?     Tingling every once in a while in back and runs down leg, no limping or trouble bearing weight, no changes with bowel/bladder control 10. OTHER SYMPTOMS: Do you have any other symptoms? (e.g., fever, abdomen pain, burning with urination, blood in urine)      No pain with urination or blood in urine Dr. Duanne is very familiar with my back       No fever Some abdominal pain but thinking diverticulitis acting up No other symptoms  Protocols used: Back Pain-A-AH

## 2023-08-01 NOTE — Progress Notes (Signed)
 Subjective:    Patient ID: [name], male    DOB: 06-09-1950, 73 y.o.   MRN: [id number] Patient presents with a 1 week history of severe low back pain.  The pain is located to the right of his lumbar spine roughly around the level of L5.  MRI in 2021 showed 7 mm of retrolisthesis of L5 on L4.  He has known degenerative disc disease in that area.  On exam today, the patient has a palpable muscle spasm that is tender to palpation.  He denies any sciatica like symptoms.  He denies any numbness or tingling in his legs.  He denies any bowel or bladder incontinence.  He denies any saddle anesthesia.  He denies any fevers or chills or dysuria. Past Medical History:  Diagnosis Date   Adenomatous colon polyp 2004   Allergy    Anginal pain (HCC)    Arthritis    Diverticulosis 2011   Dyspnea    Former smoker    GERD (gastroesophageal reflux disease)    HLD (hyperlipidemia)    Hyperplastic colon polyp 2004   Hypertension    Melanoma in situ [facility])    Past Surgical History:  Procedure Laterality Date   COLONOSCOPY  01/21/2009   SEVERAL    CORONARY STENT INTERVENTION N/A 10/04/2020   Procedure: CORONARY STENT INTERVENTION;  Surgeon: Anner Alm ORN, MD;  Location: [facility] INVASIVE CV LAB;  Service: Cardiovascular;  Laterality: N/A;   EYE SURGERY  Nov. 2018   JOINT REPLACEMENT  April 26 2020   LAMINECTOMY N/A 08/14/2022   Procedure: THORACIC LAMINECTOMY FOR TUMOR;  Surgeon: Gillie Duncans, MD;  Location[facility] OR;  Service: Neurosurgery;  Laterality: N/A;   LEFT HEART CATH AND CORONARY ANGIOGRAPHY N/A 10/04/2020   Procedure: LEFT HEART CATH AND CORONARY ANGIOGRAPHY;  Surgeon: Anner Alm ORN, MD;  Location: [facility] INVASIVE CV LAB;  Service: Cardiovascular;  Laterality: N/A;   LEFT HEART CATH AND CORONARY ANGIOGRAPHY Left 11/30/2020   Procedure: LEFT HEART CATH AND CORONARY ANGIOGRAPHY;  Surgeon: Anner Alm ORN, MD;  Location: [facility] INVASIVE CV LAB;  Service: Cardiovascular;  Laterality: Left;   LYMPH  NODE DISSECTION Bilateral 12/28/2021   Procedure: PELVIC LYMPH NODE DISSECTION;  Surgeon: Alvaro Hummer, MD;  Location: [facility] ORS;  Service: Urology;  Laterality: Bilateral;   ROBOT ASSISTED LAPAROSCOPIC RADICAL PROSTATECTOMY N/A 12/28/2021   Procedure: XI ROBOTIC ASSISTED LAPAROSCOPIC RADICAL PROSTATECTOMY AND INDOCYANINE GREEN DYE INJECTION;  Surgeon: Alvaro Hummer, MD;  Location: [facility] ORS;  Service: Urology;  Laterality: N/A;  3 HRS   TOOTH EXTRACTION     TOTAL KNEE ARTHROPLASTY Right 04/26/2020   Current Outpatient Medications on File Prior to Visit  Medication Sig Dispense Refill   losartan  (COZAAR ) 50 MG tablet Take 0.5 tablets (25 mg total) by mouth in the morning. 45 tablet 1   metoprolol  succinate (TOPROL -XL) 25 MG 24 hr tablet Take 1 tablet (25 mg total) by mouth daily. 90 tablet 0   Multiple Vitamins-Minerals (CENTRUM SILVER 50+MEN) TABS Take 1 tablet by mouth daily.     nitroGLYCERIN  (NITROSTAT ) 0.4 MG SL tablet Place 1 tablet (0.4 mg total) under the tongue every 5 (five) minutes as needed for chest pain. 50 tablet 3   rosuvastatin  (CRESTOR ) 40 MG tablet Take 1 tablet (40 mg total) by mouth daily. 90 tablet 3   cetirizine  (ZYRTEC ) 10 MG tablet Take 1 tablet (10 mg total) by mouth daily. (Patient not taking: Reported on 08/01/2023) 90 tablet 2   clotrimazole  (LOTRIMIN  AF)  1 % cream Apply 1 Application topically 2 (two) times daily. (Patient not taking: Reported on 08/01/2023) 30 g 2   tiZANidine  (ZANAFLEX ) 4 MG tablet Take 1 tablet (4 mg total) by mouth every 6 (six) hours as needed for muscle spasms. (Patient not taking: Reported on 08/01/2023) 30 tablet 0   No current facility-administered medications on file prior to visit.   Allergies  Allergen Reactions   Levaquin [Levofloxacin In D5w] Shortness Of Breath   Social History   Socioeconomic History   Marital status: Married    Spouse name: [name]   Number of children: 2   Years of education: Not on file   Highest education  level: 12th grade  Occupational History   Occupation: part Investment banker, corporate: [employer]  Tobacco Use   Smoking status: Former    Current packs/day: 0.00    Average packs/day: 1 pack/day for 30.0 years (30.0 ttl pk-yrs)    Types: Cigarettes, Cigars    Start date: 05/12/1968    Quit date: 05/13/1998    Years since quitting: 25.2   Smokeless tobacco: Current    Types: Chew  Vaping Use   Vaping status: Never Used  Substance and Sexual Activity   Alcohol use: Yes    Alcohol/week: 2.0 standard drinks of alcohol    Types: 2 Cans of beer per week   Drug use: No   Sexual activity: Not Currently  Other Topics Concern   Not on file  Social History Narrative   Married x 51 years   2 grandsons   Social Drivers of Corporate investment banker Strain: Low Risk  (03/24/2023)   Overall Financial Resource Strain (CARDIA)    Difficulty of Paying Living Expenses: Not hard at all  Food Insecurity: No Food Insecurity (03/24/2023)   Hunger Vital Sign    Worried About Running Out of Food in the Last Year: Never true    Ran Out of Food in the Last Year: Never true  Transportation Needs: No Transportation Needs (03/24/2023)   PRAPARE - Administrator, Civil Service (Medical): No    Lack of Transportation (Non-Medical): No  Physical Activity: Insufficiently Active (03/24/2023)   Exercise Vital Sign    Days of Exercise per Week: 2 days    Minutes of Exercise per Session: 10 min  Stress: No Stress Concern Present (03/24/2023)   [employer] of Occupational Health - Occupational Stress Questionnaire    Feeling of Stress : Only a little  Social Connections: Moderately Integrated (03/24/2023)   Social Connection and Isolation Panel    Frequency of Communication with Friends and Family: More than three times a week    Frequency of Social Gatherings with Friends and Family: Twice a week    Attends Religious Services: More than 4 times per year    Active Member of [employer] or Organizations: Yes     Attends Banker Meetings: More than 4 times per year    Marital Status: Widowed  Intimate Partner Violence: Not At Risk (11/14/2022)   Humiliation, Afraid, Rape, and Kick questionnaire    Fear of Current or Ex-Partner: No    Emotionally Abused: No    Physically Abused: No    Sexually Abused: No      Review of Systems  All other systems reviewed and are negative.      Objective:   Physical Exam Vitals reviewed.  Constitutional:      General: He is not in acute distress.  Appearance: He is well-developed. He is not diaphoretic.  HENT:     Mouth/Throat:     Pharynx: No oropharyngeal exudate.  Eyes:     Conjunctiva/sclera: Conjunctivae normal.     Pupils: Pupils are equal, round, and reactive to light.  Cardiovascular:     Rate and Rhythm: Normal rate and regular rhythm.     Heart sounds: Normal heart sounds.  Pulmonary:     Effort: Pulmonary effort is normal. No respiratory distress.     Breath sounds: Normal breath sounds. No wheezing or rales.  Musculoskeletal:     Cervical back: Neck supple.     Lumbar back: Spasms and tenderness present. No bony tenderness. Decreased range of motion.  Lymphadenopathy:     Cervical: No cervical adenopathy.  Skin:    Findings: No erythema or rash.           Assessment & Plan:  Acute right-sided low back pain without sciatica I believe the patient has pulled a muscle in his lower back based on the muscle spasm.  Begin prednisone  taper pack and use tizanidine  4 mg every 6 hours as needed.  He can use Percocet for breakthrough pain.  Recheck in 1 week or sooner if worse.

## 2023-08-05 DIAGNOSIS — M9904 Segmental and somatic dysfunction of sacral region: Secondary | ICD-10-CM | POA: Diagnosis not present

## 2023-08-05 DIAGNOSIS — M9905 Segmental and somatic dysfunction of pelvic region: Secondary | ICD-10-CM | POA: Diagnosis not present

## 2023-08-05 DIAGNOSIS — M9903 Segmental and somatic dysfunction of lumbar region: Secondary | ICD-10-CM | POA: Diagnosis not present

## 2023-08-05 DIAGNOSIS — M5136 Other intervertebral disc degeneration, lumbar region with discogenic back pain only: Secondary | ICD-10-CM | POA: Diagnosis not present

## 2023-08-06 DIAGNOSIS — M5136 Other intervertebral disc degeneration, lumbar region with discogenic back pain only: Secondary | ICD-10-CM | POA: Diagnosis not present

## 2023-08-06 DIAGNOSIS — M9905 Segmental and somatic dysfunction of pelvic region: Secondary | ICD-10-CM | POA: Diagnosis not present

## 2023-08-06 DIAGNOSIS — M9904 Segmental and somatic dysfunction of sacral region: Secondary | ICD-10-CM | POA: Diagnosis not present

## 2023-08-06 DIAGNOSIS — M9903 Segmental and somatic dysfunction of lumbar region: Secondary | ICD-10-CM | POA: Diagnosis not present

## 2023-08-12 DIAGNOSIS — M9904 Segmental and somatic dysfunction of sacral region: Secondary | ICD-10-CM | POA: Diagnosis not present

## 2023-08-12 DIAGNOSIS — M9903 Segmental and somatic dysfunction of lumbar region: Secondary | ICD-10-CM | POA: Diagnosis not present

## 2023-08-12 DIAGNOSIS — M5136 Other intervertebral disc degeneration, lumbar region with discogenic back pain only: Secondary | ICD-10-CM | POA: Diagnosis not present

## 2023-08-12 DIAGNOSIS — M9905 Segmental and somatic dysfunction of pelvic region: Secondary | ICD-10-CM | POA: Diagnosis not present

## 2023-08-19 DIAGNOSIS — M9903 Segmental and somatic dysfunction of lumbar region: Secondary | ICD-10-CM | POA: Diagnosis not present

## 2023-08-19 DIAGNOSIS — M9904 Segmental and somatic dysfunction of sacral region: Secondary | ICD-10-CM | POA: Diagnosis not present

## 2023-08-19 DIAGNOSIS — M9905 Segmental and somatic dysfunction of pelvic region: Secondary | ICD-10-CM | POA: Diagnosis not present

## 2023-08-19 DIAGNOSIS — M5136 Other intervertebral disc degeneration, lumbar region with discogenic back pain only: Secondary | ICD-10-CM | POA: Diagnosis not present

## 2023-08-22 DIAGNOSIS — C495 Malignant neoplasm of connective and soft tissue of pelvis: Secondary | ICD-10-CM | POA: Diagnosis not present

## 2023-08-22 DIAGNOSIS — C61 Malignant neoplasm of prostate: Secondary | ICD-10-CM | POA: Diagnosis not present

## 2023-08-22 DIAGNOSIS — C763 Malignant neoplasm of pelvis: Secondary | ICD-10-CM | POA: Diagnosis not present

## 2023-09-01 DIAGNOSIS — N5231 Erectile dysfunction following radical prostatectomy: Secondary | ICD-10-CM | POA: Diagnosis not present

## 2023-09-01 DIAGNOSIS — C495 Malignant neoplasm of connective and soft tissue of pelvis: Secondary | ICD-10-CM | POA: Diagnosis not present

## 2023-09-01 DIAGNOSIS — N393 Stress incontinence (female) (male): Secondary | ICD-10-CM | POA: Diagnosis not present

## 2023-09-02 DIAGNOSIS — M9903 Segmental and somatic dysfunction of lumbar region: Secondary | ICD-10-CM | POA: Diagnosis not present

## 2023-09-02 DIAGNOSIS — M5136 Other intervertebral disc degeneration, lumbar region with discogenic back pain only: Secondary | ICD-10-CM | POA: Diagnosis not present

## 2023-09-02 DIAGNOSIS — M9905 Segmental and somatic dysfunction of pelvic region: Secondary | ICD-10-CM | POA: Diagnosis not present

## 2023-09-02 DIAGNOSIS — M9904 Segmental and somatic dysfunction of sacral region: Secondary | ICD-10-CM | POA: Diagnosis not present

## 2023-09-11 DIAGNOSIS — N5231 Erectile dysfunction following radical prostatectomy: Secondary | ICD-10-CM | POA: Diagnosis not present

## 2023-09-11 DIAGNOSIS — N393 Stress incontinence (female) (male): Secondary | ICD-10-CM | POA: Diagnosis not present

## 2023-09-18 DIAGNOSIS — N5231 Erectile dysfunction following radical prostatectomy: Secondary | ICD-10-CM | POA: Diagnosis not present

## 2023-09-18 DIAGNOSIS — C495 Malignant neoplasm of connective and soft tissue of pelvis: Secondary | ICD-10-CM | POA: Diagnosis not present

## 2023-09-18 DIAGNOSIS — N393 Stress incontinence (female) (male): Secondary | ICD-10-CM | POA: Diagnosis not present

## 2023-09-30 DIAGNOSIS — M9905 Segmental and somatic dysfunction of pelvic region: Secondary | ICD-10-CM | POA: Diagnosis not present

## 2023-09-30 DIAGNOSIS — M5136 Other intervertebral disc degeneration, lumbar region with discogenic back pain only: Secondary | ICD-10-CM | POA: Diagnosis not present

## 2023-09-30 DIAGNOSIS — M9903 Segmental and somatic dysfunction of lumbar region: Secondary | ICD-10-CM | POA: Diagnosis not present

## 2023-09-30 DIAGNOSIS — M9904 Segmental and somatic dysfunction of sacral region: Secondary | ICD-10-CM | POA: Diagnosis not present

## 2023-10-15 ENCOUNTER — Other Ambulatory Visit: Payer: Self-pay | Admitting: Urology

## 2023-10-15 ENCOUNTER — Telehealth: Payer: Self-pay

## 2023-10-15 ENCOUNTER — Telehealth: Payer: Self-pay | Admitting: Cardiology

## 2023-10-15 NOTE — Telephone Encounter (Signed)
   Pre-operative Risk Assessment    Patient Name: [name]  DOB: 1950/02/28 MRN: [id number]      Request for Surgical Clearance    Procedure:  Insertion of penele prosthesis  Date of Surgery:  Clearance 11/25/23                                 Surgeon: Dr. Arlyss Going Surgeon's Group or Practice Name: [facility] Phone number:  [phone] Fax number: [phone]   Type of Clearance Requested:   - Medical    Type of Anesthesia:  General    Additional requests/questions:     [name]   10/15/2023, 11:22 AM

## 2023-10-15 NOTE — Telephone Encounter (Signed)
 Patient has been scheduled for televisit med rec and consent done     Patient Consent for Virtual Visit         [name] has provided verbal consent on 10/15/2023 for a virtual visit (video or telephone).   CONSENT FOR VIRTUAL VISIT FOR:  [name]  By participating in this virtual visit I agree to the following:  I hereby voluntarily request, consent and authorize [facility] and its employed or contracted physicians, physician assistants, nurse practitioners or other licensed health care professionals (the Practitioner), to provide me with telemedicine health care services (the "Services) as deemed necessary by the treating Practitioner. I acknowledge and consent to receive the Services by the Practitioner via telemedicine. I understand that the telemedicine visit will involve communicating with the Practitioner through live audiovisual communication technology and the disclosure of certain medical information by electronic transmission. I acknowledge that I have been given the opportunity to request an in-person assessment or other available alternative prior to the telemedicine visit and am voluntarily participating in the telemedicine visit.  I understand that I have the right to withhold or withdraw my consent to the use of telemedicine in the course of my care at any time, without affecting my right to future care or treatment, and that the Practitioner or I may terminate the telemedicine visit at any time. I understand that I have the right to inspect all information obtained and/or recorded in the course of the telemedicine visit and may receive copies of available information for a reasonable fee.  I understand that some of the potential risks of receiving the Services via telemedicine include:  Delay or interruption in medical evaluation due to technological equipment failure or disruption; Information transmitted may not be sufficient (e.g. poor resolution of images) to  allow for appropriate medical decision making by the Practitioner; and/or  In rare instances, security protocols could fail, causing a breach of personal health information.  Furthermore, I acknowledge that it is my responsibility to provide information about my medical history, conditions and care that is complete and accurate to the best of my ability. I acknowledge that Practitioner's advice, recommendations, and/or decision may be based on factors not within their control, such as incomplete or inaccurate data provided by me or distortions of diagnostic images or specimens that may result from electronic transmissions. I understand that the practice of medicine is not an exact science and that Practitioner makes no warranties or guarantees regarding treatment outcomes. I acknowledge that a copy of this consent can be made available to me via my patient portal [facility] MyChart), or I can request a printed copy by calling the office of [facility].    I understand that my insurance will be billed for this visit.   I have read or had this consent read to me. I understand the contents of this consent, which adequately explains the benefits and risks of the Services being provided via telemedicine.  I have been provided ample opportunity to ask questions regarding this consent and the Services and have had my questions answered to my satisfaction. I give my informed consent for the services to be provided through the use of telemedicine in my medical care

## 2023-10-15 NOTE — Telephone Encounter (Signed)
   Name: [name]  DOB: 07-Oct-1950  MRN: [id number]  Primary Cardiologist: Redell Cave, MD   Preoperative team, please contact this patient and set up a phone call appointment for further preoperative risk assessment. Please obtain consent and complete medication review. Thank you for your help.  I confirm that guidance regarding antiplatelet and oral anticoagulation therapy has been completed and, if necessary, noted below.  Patient is not on anticoagulation or antiplatelet per review of medical record in Epic.    I also confirmed the patient resides in the state of [place] . As per [facility] Medical Board telemedicine laws, the patient must reside in the state in which the provider is licensed.   Lamarr Satterfield, NP 10/15/2023, 11:25 AM [facility]

## 2023-10-15 NOTE — Telephone Encounter (Signed)
Patient has been scheduled for televisit.

## 2023-10-20 DIAGNOSIS — C495 Malignant neoplasm of connective and soft tissue of pelvis: Secondary | ICD-10-CM | POA: Diagnosis not present

## 2023-10-20 DIAGNOSIS — F17211 Nicotine dependence, cigarettes, in remission: Secondary | ICD-10-CM | POA: Diagnosis not present

## 2023-10-20 DIAGNOSIS — K219 Gastro-esophageal reflux disease without esophagitis: Secondary | ICD-10-CM | POA: Diagnosis not present

## 2023-10-20 DIAGNOSIS — I10 Essential (primary) hypertension: Secondary | ICD-10-CM | POA: Diagnosis not present

## 2023-10-20 DIAGNOSIS — G8929 Other chronic pain: Secondary | ICD-10-CM | POA: Diagnosis not present

## 2023-10-20 DIAGNOSIS — I700 Atherosclerosis of aorta: Secondary | ICD-10-CM | POA: Diagnosis not present

## 2023-10-20 DIAGNOSIS — J449 Chronic obstructive pulmonary disease, unspecified: Secondary | ICD-10-CM | POA: Diagnosis not present

## 2023-10-20 DIAGNOSIS — E669 Obesity, unspecified: Secondary | ICD-10-CM | POA: Diagnosis not present

## 2023-10-20 DIAGNOSIS — E785 Hyperlipidemia, unspecified: Secondary | ICD-10-CM | POA: Diagnosis not present

## 2023-10-20 DIAGNOSIS — M1990 Unspecified osteoarthritis, unspecified site: Secondary | ICD-10-CM | POA: Diagnosis not present

## 2023-10-20 DIAGNOSIS — F17200 Nicotine dependence, unspecified, uncomplicated: Secondary | ICD-10-CM | POA: Diagnosis not present

## 2023-10-20 DIAGNOSIS — J329 Chronic sinusitis, unspecified: Secondary | ICD-10-CM | POA: Diagnosis not present

## 2023-10-28 DIAGNOSIS — M9905 Segmental and somatic dysfunction of pelvic region: Secondary | ICD-10-CM | POA: Diagnosis not present

## 2023-10-28 DIAGNOSIS — M9904 Segmental and somatic dysfunction of sacral region: Secondary | ICD-10-CM | POA: Diagnosis not present

## 2023-10-28 DIAGNOSIS — M5136 Other intervertebral disc degeneration, lumbar region with discogenic back pain only: Secondary | ICD-10-CM | POA: Diagnosis not present

## 2023-10-28 DIAGNOSIS — M9903 Segmental and somatic dysfunction of lumbar region: Secondary | ICD-10-CM | POA: Diagnosis not present

## 2023-11-10 ENCOUNTER — Ambulatory Visit: Attending: Cardiovascular Disease

## 2023-11-10 DIAGNOSIS — Z01810 Encounter for preprocedural cardiovascular examination: Secondary | ICD-10-CM

## 2023-11-10 NOTE — Progress Notes (Signed)
 Virtual Visit via Telephone Note   Because of [name] co-morbid illnesses, he is at least at moderate risk for complications without adequate follow up.  This format is felt to be most appropriate for this patient at this time.  Due to technical limitations with video connection (technology), today's appointment will be conducted as an audio only telehealth visit, and [name] verbally agreed to proceed in this manner.   All issues noted in this document were discussed and addressed.  No physical exam could be performed with this format.  Evaluation Performed:  Preoperative cardiovascular risk assessment _____________   Date:  11/10/2023   Patient ID:  [name], DOB Oct 16, 1950, MRN [id number] Patient Location:  Home Provider location:   Office  Primary Care Provider:  Duanne Butler DASEN, MD Primary Cardiologist:  Redell Cave, MD Chief Complaint / Patient Profile  73 y.o. y/o male with a h/o CAD s/p PCI to the LAD in 09/2020, hypertension, hyperlipidemia, former tobacco use who is pending insertion of penile prosthesis and presents today for telephonic preoperative cardiovascular risk assessment. History of Present Illness  [name] is a 73 y.o. male who presents via audio/video conferencing for a telehealth visit today.  Pt was last seen in cardiology clinic on 05/23/2023 by Dr. Cave.  At that time MARTYN TIMME was doing well.  The patient is now pending procedure as outlined above. Since his last visit, he has remained stable from a cardiac standpoint. Today denies chest pain, shortness of breath, lower extremity edema, fatigue, palpitations, melena, hematuria, hemoptysis, diaphoresis, weakness, presyncope, syncope, orthopnea, and PND. He is able to achieve greater than 4 METs of activity with walking, climbing of stairs, yard work, household chores and he regularly goes fishing. Past Medical History    Past Medical History:  Diagnosis Date   Adenomatous colon  polyp 2004   Allergy    Anginal pain    Arthritis    Diverticulosis 2011   Dyspnea    Former smoker    GERD (gastroesophageal reflux disease)    HLD (hyperlipidemia)    Hyperplastic colon polyp 2004   Hypertension    Melanoma in situ [facility])    Past Surgical History:  Procedure Laterality Date   COLONOSCOPY  01/21/2009   SEVERAL    CORONARY STENT INTERVENTION N/A 10/04/2020   Procedure: CORONARY STENT INTERVENTION;  Surgeon: Anner Alm ORN, MD;  Location: [facility] INVASIVE CV LAB;  Service: Cardiovascular;  Laterality: N/A;   EYE SURGERY  Nov. 2018   JOINT REPLACEMENT  April 26 2020   LAMINECTOMY N/A 08/14/2022   Procedure: THORACIC LAMINECTOMY FOR TUMOR;  Surgeon: Gillie Duncans, MD;  Location: [facility] OR;  Service: Neurosurgery;  Laterality: N/A;   LEFT HEART CATH AND CORONARY ANGIOGRAPHY N/A 10/04/2020   Procedure: LEFT HEART CATH AND CORONARY ANGIOGRAPHY;  Surgeon: Anner Alm ORN, MD;  Location: [facility] INVASIVE CV LAB;  Service: Cardiovascular;  Laterality: N/A;   LEFT HEART CATH AND CORONARY ANGIOGRAPHY Left 11/30/2020   Procedure: LEFT HEART CATH AND CORONARY ANGIOGRAPHY;  Surgeon: Anner Alm ORN, MD;  Location: [facility] INVASIVE CV LAB;  Service: Cardiovascular;  Laterality: Left;   LYMPH NODE DISSECTION Bilateral 12/28/2021   Procedure: PELVIC LYMPH NODE DISSECTION;  Surgeon: Alvaro Hummer, MD;  Location: [facility] ORS;  Service: Urology;  Laterality: Bilateral;   ROBOT ASSISTED LAPAROSCOPIC RADICAL PROSTATECTOMY N/A 12/28/2021   Procedure: XI ROBOTIC ASSISTED LAPAROSCOPIC RADICAL PROSTATECTOMY AND INDOCYANINE GREEN DYE INJECTION;  Surgeon: Alvaro Hummer, MD;  Location: [facility] ORS;  Service: Urology;  Laterality: N/A;  3 HRS   TOOTH EXTRACTION     TOTAL KNEE ARTHROPLASTY Right 04/26/2020   Allergies Allergies  Allergen Reactions   Levaquin [Levofloxacin In D5w] Shortness Of Breath   Home Medications    Prior to Admission medications   Medication Sig Start Date End Date Taking? Authorizing  Provider  cetirizine  (ZYRTEC ) 10 MG tablet Take 1 tablet (10 mg total) by mouth daily. Patient not taking: Reported on 08/01/2023 01/23/23   Duanne Butler DASEN, MD  clotrimazole  (LOTRIMIN  AF) 1 % cream Apply 1 Application topically 2 (two) times daily. Patient not taking: Reported on 08/01/2023 11/28/22   Duanne Butler DASEN, MD  losartan  (COZAAR ) 50 MG tablet Take 0.5 tablets (25 mg total) by mouth in the morning. 10/22/22   Darliss Rogue, MD  metoprolol  succinate (TOPROL -XL) 25 MG 24 hr tablet Take 1 tablet (25 mg total) by mouth daily. 07/30/23   Duanne Butler DASEN, MD  Multiple Vitamins-Minerals (CENTRUM SILVER 50+MEN) TABS Take 1 tablet by mouth daily.    [provider]  nitroGLYCERIN  (NITROSTAT ) 0.4 MG SL tablet Place 1 tablet (0.4 mg total) under the tongue every 5 (five) minutes as needed for chest pain. 09/06/22   Duanne Butler DASEN, MD  predniSONE  (DELTASONE ) 20 MG tablet 3 tabs poqday 1-2, 2 tabs poqday 3-4, 1 tab poqday 5-6 08/01/23   Duanne Butler DASEN, MD  rosuvastatin  (CRESTOR ) 40 MG tablet Take 1 tablet (40 mg total) by mouth daily. 12/10/22   Duanne Butler DASEN, MD  tiZANidine  (ZANAFLEX ) 4 MG tablet Take 1 tablet (4 mg total) by mouth every 6 (six) hours as needed for muscle spasms. Patient not taking: Reported on 08/01/2023 12/10/22   Duanne Butler DASEN, MD   Physical Exam  Vital Signs:  JAIRON RIPBERGER does not have vital signs available for review today. Given telephonic nature of communication, physical exam is limited. AAOx3. NAD. Normal affect.  Speech and respirations are unlabored Accessory Clinical Findings  None Assessment & Plan  1.  Preoperative Cardiovascular Risk Assessment: [name] perioperative risk of a major cardiac event is 0.9% according to the Revised Cardiac Risk Index (RCRI).  Therefore, he is at low risk for perioperative complications.   His functional capacity is good at 7.99 METs according to the Duke Activity Status Index  (DASI). Recommendations: According to ACC/AHA guidelines, no further cardiovascular testing needed.  The patient may proceed to surgery at acceptable risk.   Antiplatelet and/or Anticoagulation Recommendations: None requested.   The patient was advised that if he develops new symptoms prior to surgery to contact our office to arrange for a follow-up visit, and he verbalized understanding. A copy of this note will be routed to requesting surgeon.  Time:   Today, I have spent 10 minutes with the patient with telehealth technology discussing medical history, symptoms, and management plan.    Edouard Gikas D Jamiria Langill, NP  11/10/2023, 4:52 PM

## 2023-11-20 ENCOUNTER — Encounter (HOSPITAL_COMMUNITY): Payer: Self-pay | Admitting: Urology

## 2023-11-20 ENCOUNTER — Ambulatory Visit: Payer: PPO | Admitting: *Deleted

## 2023-11-20 VITALS — Ht 69.0 in | Wt 219.0 lb

## 2023-11-20 DIAGNOSIS — Z0000 Encounter for general adult medical examination without abnormal findings: Secondary | ICD-10-CM | POA: Diagnosis not present

## 2023-11-20 DIAGNOSIS — Z1211 Encounter for screening for malignant neoplasm of colon: Secondary | ICD-10-CM

## 2023-11-20 NOTE — Progress Notes (Signed)
 Cardiac clearance note in Epic dated 11/10/23 by K. Devora, NP. Cardiologist LOV 05/23/23 Dr Darliss Sermon w/ via phone for pre-op interview--- [facility] needs dos----  BMP (Gent) per anesthesia.       Lab results------ Current EKG in Epic dated 05/23/23. COVID test -----patient states asymptomatic no test needed Arrive at -------0530 NPO after MN NO Solid Food.  Pre-Surgery Ensure or G2:  Med rec completed Medications to take morning of surgery ----- Metoprolol  and Crestor  Diabetic medication -----  GLP1 agonist last dose: GLP1 instructions:  Patient instructed no nail polish to be worn day of surgery Patient instructed to bring photo id and insurance card day of surgery Patient aware to have Driver (ride ) / caregiver    for 24 hours after surgery - Son Carlin Attridge Patient Special Instructions -----No tobacco 24hrs prior. Pre-Op special Instructions -----  Patient verbalized understanding of instructions that were given at this phone interview. Patient denies chest pain, sob, fever, cough at the interview.

## 2023-11-20 NOTE — Patient Instructions (Signed)
 [name] , Thank you for taking time to come for your Medicare Wellness Visit. I appreciate your ongoing commitment to your health goals. Please review the following plan we discussed and let me know if I can assist you in the future.   Screening recommendations/referrals: Colonoscopy:  Recommended yearly ophthalmology/optometry visit for glaucoma screening and checkup Recommended yearly dental visit for hygiene and checkup  Vaccinations: Influenza vaccine:  Pneumococcal vaccine:  Tdap vaccine:  Shingles vaccine:       Preventive Care 65 Years and Older, Male Preventive care refers to lifestyle choices and visits with your health care provider that can promote health and wellness. What does preventive care include? A yearly physical exam. This is also called an annual well check. Dental exams once or twice a year. Routine eye exams. Ask your health care provider how often you should have your eyes checked. Personal lifestyle choices, including: Daily care of your teeth and gums. Regular physical activity. Eating a healthy diet. Avoiding tobacco and drug use. Limiting alcohol use. Practicing safe sex. Taking low doses of aspirin  every day. Taking vitamin and mineral supplements as recommended by your health care provider. What happens during an annual well check? The services and screenings done by your health care provider during your annual well check will depend on your age, overall health, lifestyle risk factors, and family history of disease. Counseling  Your health care provider may ask you questions about your: Alcohol use. Tobacco use. Drug use. Emotional well-being. Home and relationship well-being. Sexual activity. Eating habits. History of falls. Memory and ability to understand (cognition). Work and work astronomer. Screening  You may have the following tests or measurements: Height, weight, and BMI. Blood pressure. Lipid and cholesterol levels. These may  be checked every 5 years, or more frequently if you are over 24 years old. Skin check. Lung cancer screening. You may have this screening every year starting at age 85 if you have a 30-pack-year history of smoking and currently smoke or have quit within the past 15 years. Fecal occult blood test (FOBT) of the stool. You may have this test every year starting at age 35. Flexible sigmoidoscopy or colonoscopy. You may have a sigmoidoscopy every 5 years or a colonoscopy every 10 years starting at age 42. Prostate cancer screening. Recommendations will vary depending on your family history and other risks. Hepatitis C blood test. Hepatitis B blood test. Sexually transmitted disease (STD) testing. Diabetes screening. This is done by checking your blood sugar (glucose) after you have not eaten for a while (fasting). You may have this done every 1-3 years. Abdominal aortic aneurysm (AAA) screening. You may need this if you are a current or former smoker. Osteoporosis. You may be screened starting at age 53 if you are at high risk. Talk with your health care provider about your test results, treatment options, and if necessary, the need for more tests. Vaccines  Your health care provider may recommend certain vaccines, such as: Influenza vaccine. This is recommended every year. Tetanus, diphtheria, and acellular pertussis (Tdap, Td) vaccine. You may need a Td booster every 10 years. Zoster vaccine. You may need this after age 56. Pneumococcal 13-valent conjugate (PCV13) vaccine. One dose is recommended after age 20. Pneumococcal polysaccharide (PPSV23) vaccine. One dose is recommended after age 32. Talk to your health care provider about which screenings and vaccines you need and how often you need them. This information is not intended to replace advice given to you by your health care  provider. Make sure you discuss any questions you have with your health care provider. Document Released: 02/03/2015  Document Revised: 09/27/2015 Document Reviewed: 11/08/2014 Elsevier Interactive Patient Education  2017 [employer].  Fall Prevention in the Home Falls can cause injuries. They can happen to people of all ages. There are many things you can do to make your home safe and to help prevent falls. What can I do on the outside of my home? Regularly fix the edges of walkways and driveways and fix any cracks. Remove anything that might make you trip as you walk through a door, such as a raised step or threshold. Trim any bushes or trees on the path to your home. Use bright outdoor lighting. Clear any walking paths of anything that might make someone trip, such as rocks or tools. Regularly check to see if handrails are loose or broken. Make sure that both sides of any steps have handrails. Any raised decks and porches should have guardrails on the edges. Have any leaves, snow, or ice cleared regularly. Use sand or salt on walking paths during winter. Clean up any spills in your garage right away. This includes oil or grease spills. What can I do in the bathroom? Use night lights. Install grab bars by the toilet and in the tub and shower. Do not use towel bars as grab bars. Use non-skid mats or decals in the tub or shower. If you need to sit down in the shower, use a plastic, non-slip stool. Keep the floor dry. Clean up any water  that spills on the floor as soon as it happens. Remove soap buildup in the tub or shower regularly. Attach bath mats securely with double-sided non-slip rug tape. Do not have throw rugs and other things on the floor that can make you trip. What can I do in the bedroom? Use night lights. Make sure that you have a light by your bed that is easy to reach. Do not use any sheets or blankets that are too big for your bed. They should not hang down onto the floor. Have a firm chair that has side arms. You can use this for support while you get dressed. Do not have throw rugs  and other things on the floor that can make you trip. What can I do in the kitchen? Clean up any spills right away. Avoid walking on wet floors. Keep items that you use a lot in easy-to-reach places. If you need to reach something above you, use a strong step stool that has a grab bar. Keep electrical cords out of the way. Do not use floor polish or wax that makes floors slippery. If you must use wax, use non-skid floor wax. Do not have throw rugs and other things on the floor that can make you trip. What can I do with my stairs? Do not leave any items on the stairs. Make sure that there are handrails on both sides of the stairs and use them. Fix handrails that are broken or loose. Make sure that handrails are as long as the stairways. Check any carpeting to make sure that it is firmly attached to the stairs. Fix any carpet that is loose or worn. Avoid having throw rugs at the top or bottom of the stairs. If you do have throw rugs, attach them to the floor with carpet tape. Make sure that you have a light switch at the top of the stairs and the bottom of the stairs. If you do not have  them, ask someone to add them for you. What else can I do to help prevent falls? Wear shoes that: Do not have high heels. Have rubber bottoms. Are comfortable and fit you well. Are closed at the toe. Do not wear sandals. If you use a stepladder: Make sure that it is fully opened. Do not climb a closed stepladder. Make sure that both sides of the stepladder are locked into place. Ask someone to hold it for you, if possible. Clearly mark and make sure that you can see: Any grab bars or handrails. First and last steps. Where the edge of each step is. Use tools that help you move around (mobility aids) if they are needed. These include: Canes. Walkers. Scooters. Crutches. Turn on the lights when you go into a dark area. Replace any light bulbs as soon as they burn out. Set up your furniture so you have a  clear path. Avoid moving your furniture around. If any of your floors are uneven, fix them. If there are any pets around you, be aware of where they are. Review your medicines with your doctor. Some medicines can make you feel dizzy. This can increase your chance of falling. Ask your doctor what other things that you can do to help prevent falls. This information is not intended to replace advice given to you by your health care provider. Make sure you discuss any questions you have with your health care provider. Document Released: 11/03/2008 Document Revised: 06/15/2015 Document Reviewed: 02/11/2014 Elsevier Interactive Patient Education  2017 [employer].

## 2023-11-20 NOTE — Progress Notes (Signed)
 Subjective:   [name] is a 73 y.o. male who presents for Medicare Annual/Subsequent preventive examination.  Visit Complete: Virtual I connected with  [name] on 11/20/23 by a audio enabled telemedicine application and verified that I am speaking with the correct person using two identifiers.  Patient Location: Home  Provider Location: Home Office  I discussed the limitations of evaluation and management by telemedicine. The patient expressed understanding and agreed to proceed.  Vital Signs: Because this visit was a virtual/telehealth visit, some criteria may be missing or patient reported. Any vitals not documented were not able to be obtained and vitals that have been documented are patient reported.  Patient Medicare AWV questionnaire was completed by the patient on 11-14-2023; I have confirmed that all information answered by patient is correct and no changes since this date.  Cardiac Risk Factors include: advanced age (>33men, >47 women);hypertension;male gender;family history of premature cardiovascular disease;obesity (BMI >30kg/m2)     Objective:    Today's Vitals   11/20/23 0825  Weight: 219 lb (99.3 kg)  Height: 5' 9 (1.753 m)   Body mass index is 32.34 kg/m.     11/20/2023    8:27 AM 11/20/2022    6:36 AM 11/14/2022    8:41 AM 08/14/2022    6:15 AM 08/12/2022    2:46 PM 12/28/2021    5:00 PM 12/18/2021    7:53 AM  Advanced Directives  Does Patient Have a Medical Advance Directive? No No No No No No No  Would patient like information on creating a medical advance directive? No - Patient declined No - Patient declined Yes (MAU/Ambulatory/Procedural Areas - Information given) No - Patient declined No - Patient declined No - Patient declined No - Patient declined    Current Medications (verified) Outpatient Encounter Medications as of 11/20/2023  Medication Sig   losartan  (COZAAR ) 50 MG tablet Take 0.5 tablets (25 mg total) by mouth in the morning.    metoprolol  succinate (TOPROL -XL) 25 MG 24 hr tablet Take 1 tablet (25 mg total) by mouth daily.   Multiple Vitamins-Minerals (CENTRUM SILVER 50+MEN) TABS Take 1 tablet by mouth daily.   nitroGLYCERIN  (NITROSTAT ) 0.4 MG SL tablet Place 1 tablet (0.4 mg total) under the tongue every 5 (five) minutes as needed for chest pain.   predniSONE  (DELTASONE ) 20 MG tablet 3 tabs poqday 1-2, 2 tabs poqday 3-4, 1 tab poqday 5-6   rosuvastatin  (CRESTOR ) 40 MG tablet Take 1 tablet (40 mg total) by mouth daily.   tiZANidine  (ZANAFLEX ) 4 MG tablet Take 1 tablet (4 mg total) by mouth every 6 (six) hours as needed for muscle spasms.   cetirizine  (ZYRTEC ) 10 MG tablet Take 1 tablet (10 mg total) by mouth daily. (Patient not taking: Reported on 11/20/2023)   clotrimazole  (LOTRIMIN  AF) 1 % cream Apply 1 Application topically 2 (two) times daily. (Patient not taking: Reported on 11/20/2023)   No facility-administered encounter medications on file as of 11/20/2023.    Allergies (verified) Levaquin [levofloxacin in d5w]   History: Past Medical History:  Diagnosis Date   Adenomatous colon polyp 2004   Allergy    Anginal pain    Arthritis    Diverticulosis 2011   Dyspnea    Former smoker    GERD (gastroesophageal reflux disease)    HLD (hyperlipidemia)    Hyperplastic colon polyp 2004   Hypertension    Melanoma in situ [facility])    Past Surgical History:  Procedure Laterality Date   COLONOSCOPY  01/21/2009   SEVERAL    CORONARY STENT INTERVENTION N/A 10/04/2020   Procedure: CORONARY STENT INTERVENTION;  Surgeon: Anner Alm ORN, MD;  [facility] INVASIVE CV LAB;  Service: Cardiovascular;  Laterality: N/A;   EYE SURGERY  Nov. 2018   JOINT REPLACEMENT  April 26 2020   LAMINECTOMY N/A 08/14/2022   Procedure: THORACIC LAMINECTOMY FOR TUMOR;  Surgeon: Gillie Duncans, MD;  Location: [facility] OR;  Service: Neurosurgery;  Laterality: N/A;   LEFT HEART CATH AND CORONARY ANGIOGRAPHY N/A 10/04/2020   Procedure: LEFT HEART  CATH AND CORONARY ANGIOGRAPHY;  Surgeon: Anner Alm ORN, MD;  Location: [facility] INVASIVE CV LAB;  Service: Cardiovascular;  Laterality: N/A;   LEFT HEART CATH AND CORONARY ANGIOGRAPHY Left 11/30/2020   Procedure: LEFT HEART CATH AND CORONARY ANGIOGRAPHY;  Surgeon: Anner Alm ORN, MD;  Location: [facility] INVASIVE CV LAB;  Service: Cardiovascular;  Laterality: Left;   LYMPH NODE DISSECTION Bilateral 12/28/2021   Procedure: PELVIC LYMPH NODE DISSECTION;  Surgeon: Alvaro Hummer, MD;  Location: [facility] ORS;  Service: Urology;  Laterality: Bilateral;   ROBOT ASSISTED LAPAROSCOPIC RADICAL PROSTATECTOMY N/A 12/28/2021   Procedure: XI ROBOTIC ASSISTED LAPAROSCOPIC RADICAL PROSTATECTOMY AND INDOCYANINE GREEN DYE INJECTION;  Surgeon: Alvaro Hummer, MD;  Location: [facility] ORS;  Service: Urology;  Laterality: N/A;  3 HRS   TOOTH EXTRACTION     TOTAL KNEE ARTHROPLASTY Right 04/26/2020   Family History  Problem Relation Age of Onset   Heart disease Mother        died in her 12's   Lung cancer Father    Cancer Father    Diabetes Daughter    Social History   Socioeconomic History   Marital status: Married    Spouse name: [name]   Number of children: 2   Years of education: Not on file   Highest education level: 12th grade  Occupational History   Occupation: part Investment Banker, Corporate: [employer]  Tobacco Use   Smoking status: Former    Current packs/day: 0.00    Average packs/day: 1 pack/day for 30.0 years (30.0 ttl pk-yrs)    Types: Cigarettes, Cigars    Start date: 05/12/1968    Quit date: 05/13/1998    Years since quitting: 25.5   Smokeless tobacco: Current    Types: Chew  Vaping Use   Vaping status: Never Used  Substance and Sexual Activity   Alcohol use: Yes    Alcohol/week: 2.0 standard drinks of alcohol    Types: 2 Cans of beer per week   Drug use: No   Sexual activity: Not Currently  Other Topics Concern   Not on file  Social History Narrative   Married x 51 years   2 grandsons   Social  Drivers of Corporate Investment Banker Strain: Low Risk  (11/20/2023)   Overall Financial Resource Strain (CARDIA)    Difficulty of Paying Living Expenses: Not hard at all  Food Insecurity: No Food Insecurity (11/20/2023)   Hunger Vital Sign    Worried About Running Out of Food in the Last Year: Never true    Ran Out of Food in the Last Year: Never true  Transportation Needs: No Transportation Needs (11/20/2023)   PRAPARE - Administrator, Civil Service (Medical): No    Lack of Transportation (Non-Medical): No  Physical Activity: Insufficiently Active (11/20/2023)   Exercise Vital Sign    Days of Exercise per Week: 2 days    Minutes of Exercise per Session: 10 min  Stress: No Stress Concern Present (11/20/2023)   [employer] of Occupational Health - Occupational Stress Questionnaire    Feeling of Stress: Only a little  Social Connections: Moderately Integrated (11/20/2023)   Social Connection and Isolation Panel    Frequency of Communication with Friends and Family: More than three times a week    Frequency of Social Gatherings with Friends and Family: Twice a week    Attends Religious Services: More than 4 times per year    Active Member of [employer] or Organizations: Yes    Attends Banker Meetings: More than 4 times per year    Marital Status: Widowed    Tobacco Counseling Ready to quit: Not Answered Counseling given: Not Answered   Clinical Intake:  Pre-visit preparation completed: Yes  Pain : No/denies pain     Diabetes: No  How often do you need to have someone help you when you read instructions, pamphlets, or other written materials from your doctor or pharmacy?: 1 - Never  Interpreter Needed?: No  Information entered by :: Mliss Graff LPN   Activities of Daily Living    11/20/2023    8:25 AM 11/14/2023    7:18 AM  In your present state of health, do you have any difficulty performing the following activities:  Hearing? 0 0   Vision? 0 0  Difficulty concentrating or making decisions? 0 0  Walking or climbing stairs? 0 0  Dressing or bathing? 0 0  Doing errands, shopping? 0 0  Preparing Food and eating ? N N  Using the Toilet? N N  In the past six months, have you accidently leaked urine? Y Y  Do you have problems with loss of bowel control? N N  Managing your Medications? N N  Managing your Finances? N N  Housekeeping or managing your Housekeeping? N N    Patient Care Team: Duanne Butler DASEN, MD as PCP - General (Family Medicine) Darliss Rogue, MD as PCP - Cardiology (Cardiology) Nicholaus Sherlean CROME, [facility] (Inactive) as Pharmacist (Pharmacist)  Indicate any recent Medical Services you may have received from other than [facility] providers in the past year (date may be approximate).     Assessment:   This is a routine wellness examination for [facility].  Hearing/Vision screen Hearing Screening - Comments:: Does need hearing aids Has not gotten them yet Vision Screening - Comments:: Not up to date [facility]   Goals Addressed             This Visit's Progress    Patient Stated       Loose weight under 200lb     Remain active and independent   On track      Depression Screen    11/20/2023    8:29 AM 03/25/2023    4:18 PM 11/14/2022    8:40 AM 07/05/2022    3:28 PM 11/09/2021    2:55 PM 11/09/2020    2:00 PM 11/16/2019    2:46 PM  PHQ 2/9 Scores  PHQ - 2 Score 0 0 0 0 0 0 0  PHQ- 9 Score 5          Fall Risk    11/20/2023    8:22 AM 11/14/2023    7:18 AM 03/25/2023    4:18 PM 11/14/2022    8:43 AM 07/05/2022    3:28 PM  Fall Risk   Falls in the past year? 0 0 0 0 0  Number falls in past yr: 0 0 0  0 0  Injury with Fall? 0 0 0 0 0  Risk for fall due to :   No Fall Risks No Fall Risks No Fall Risks  Follow up Falls evaluation completed;Education provided;Falls prevention discussed  Falls prevention discussed;Falls evaluation completed Falls prevention discussed;Education  provided;Falls evaluation completed Falls prevention discussed    MEDICARE RISK AT HOME: Medicare Risk at Home Any stairs in or around the home?: No If so, are there any without handrails?: No Home free of loose throw rugs in walkways, pet beds, electrical cords, etc?: No Adequate lighting in your home to reduce risk of falls?: Yes Life alert?: No Use of a cane, walker or w/c?: No Grab bars in the bathroom?: No Shower chair or bench in shower?: No Elevated toilet seat or a handicapped toilet?: Yes  TIMED UP AND GO:  Was the test performed?  No    Cognitive Function:        11/20/2023    8:28 AM 11/14/2022    8:43 AM 11/09/2021    2:59 PM 11/09/2020    2:08 PM  6CIT Screen  What Year? 0 points 0 points 0 points 0 points  What month? 0 points 0 points 0 points 0 points  What time? 0 points 0 points 0 points 0 points  Count back from 20 0 points 0 points 0 points 0 points  Months in reverse 0 points 0 points 0 points 0 points  Repeat phrase 0 points 0 points 0 points 0 points  Total Score 0 points 0 points 0 points 0 points    Immunizations Immunization History  Administered Date(s) Administered   Fluad Quad(high Dose 65+) 10/08/2023   Influenza,inj,Quad PF,6+ Mos 12/16/2012, 11/03/2014   Influenza-Unspecified 10/21/2016, 10/11/2020   Pneumococcal Conjugate-13 07/01/2014   Pneumococcal Polysaccharide-23 06/19/2012, 11/30/2019   Td 06/12/2001   Tdap 05/18/2013    TDAP status: Due, Education has been provided regarding the importance of this vaccine. Advised may receive this vaccine at local pharmacy or Health Dept. Aware to provide a copy of the vaccination record if obtained from local pharmacy or Health Dept. Verbalized acceptance and understanding.  Flu Vaccine status: Up to date  Pneumococcal vaccine status: Up to date  Covid-19 vaccine status: Declined, Education has been provided regarding the importance of this vaccine but patient still declined. Advised may  receive this vaccine at local pharmacy or Health Dept.or vaccine clinic. Aware to provide a copy of the vaccination record if obtained from local pharmacy or Health Dept. Verbalized acceptance and understanding.  Qualifies for Shingles Vaccine? Yes   Zostavax completed No   Shingrix Completed?: No.    Education has been provided regarding the importance of this vaccine. Patient has been advised to call insurance company to determine out of pocket expense if they have not yet received this vaccine. Advised may also receive vaccine at local pharmacy or Health Dept. Verbalized acceptance and understanding.  Screening Tests Health Maintenance  Topic Date Due   Zoster Vaccines- Shingrix (1 of 2) Never done   Colonoscopy  11/30/2019   DTaP/Tdap/Td (3 - Td or Tdap) 05/19/2023   COVID-19 Vaccine (1) 12/06/2023 (Originally 12/13/1955)   Medicare Annual Wellness (AWV)  11/19/2024   Pneumococcal Vaccine: 50+ Years  Completed   Influenza Vaccine  Completed   Hepatitis C Screening  Completed   Meningococcal B Vaccine  Aged Out    Health Maintenance  Health Maintenance Due  Topic Date Due   Zoster Vaccines- Shingrix (1 of 2) Never done  Colonoscopy  11/30/2019   DTaP/Tdap/Td (3 - Td or Tdap) 05/19/2023    Colorectal cancer screening: Referral to GI placed  . Pt aware the office will call re: appt.  Lung Cancer Screening: (Low Dose CT Chest recommended if Age 72-80 years, 20 pack-year currently smoking OR have quit w/in 15years.) does not qualify.   Lung Cancer Screening Referral:   Additional Screening:  Hepatitis C Screening: does not qualify; Completed 2019  Vision Screening: Recommended annual ophthalmology exams for early detection of glaucoma and other disorders of the eye. Is the patient up to date with their annual eye exam?  No  Who is the provider or what is the name of the office in which the patient attends annual eye exams? [facility] If pt is not established with a  provider, would they like to be referred to a provider to establish care? No .   Dental Screening: Recommended annual dental exams for proper oral hygiene    Community Resource Referral / Chronic Care Management: CRR required this visit?  No   CCM required this visit?  No     Plan:     I have personally reviewed and noted the following in the patient's chart:   Medical and social history Use of alcohol, tobacco or illicit drugs  Current medications and supplements including opioid prescriptions. Patient is not currently taking opioid prescriptions. Functional ability and status Nutritional status Physical activity Advanced directives List of other physicians Hospitalizations, surgeries, and ER visits in previous 12 months Vitals Screenings to include cognitive, depression, and falls Referrals and appointments  In addition, I have reviewed and discussed with patient certain preventive protocols, quality metrics, and best practice recommendations. A written personalized care plan for preventive services as well as general preventive health recommendations were provided to patient.     Mliss Graff, LPN   [id number]   After Visit Summary: (MyChart) Due to this being a telephonic visit, the after visit summary with patients personalized plan was offered to patient via MyChart   Nurse Notes:

## 2023-11-25 ENCOUNTER — Encounter (HOSPITAL_COMMUNITY): Payer: Self-pay | Admitting: Urology

## 2023-11-25 ENCOUNTER — Ambulatory Visit (HOSPITAL_COMMUNITY): Admitting: Certified Registered Nurse Anesthetist

## 2023-11-25 ENCOUNTER — Ambulatory Visit (HOSPITAL_COMMUNITY)
Admission: RE | Admit: 2023-11-25 | Discharge: 2023-11-25 | Disposition: A | Discharge status: Home / Self Care | Attending: Urology | Admitting: Urology

## 2023-11-25 ENCOUNTER — Encounter (HOSPITAL_COMMUNITY)
Admission: RE | Disposition: A | Discharge status: Home / Self Care | Payer: Self-pay | Source: Home / Self Care | Attending: Urology

## 2023-11-25 DIAGNOSIS — Z960 Presence of urogenital implants: Secondary | ICD-10-CM

## 2023-11-25 DIAGNOSIS — J449 Chronic obstructive pulmonary disease, unspecified: Secondary | ICD-10-CM | POA: Insufficient documentation

## 2023-11-25 DIAGNOSIS — G709 Myoneural disorder, unspecified: Secondary | ICD-10-CM | POA: Insufficient documentation

## 2023-11-25 DIAGNOSIS — Z87891 Personal history of nicotine dependence: Secondary | ICD-10-CM | POA: Insufficient documentation

## 2023-11-25 DIAGNOSIS — N529 Male erectile dysfunction, unspecified: Secondary | ICD-10-CM | POA: Diagnosis not present

## 2023-11-25 DIAGNOSIS — K219 Gastro-esophageal reflux disease without esophagitis: Secondary | ICD-10-CM | POA: Diagnosis not present

## 2023-11-25 DIAGNOSIS — I080 Rheumatic disorders of both mitral and aortic valves: Secondary | ICD-10-CM | POA: Diagnosis not present

## 2023-11-25 DIAGNOSIS — I2510 Atherosclerotic heart disease of native coronary artery without angina pectoris: Secondary | ICD-10-CM | POA: Diagnosis not present

## 2023-11-25 DIAGNOSIS — I25119 Atherosclerotic heart disease of native coronary artery with unspecified angina pectoris: Secondary | ICD-10-CM | POA: Insufficient documentation

## 2023-11-25 DIAGNOSIS — M1990 Unspecified osteoarthritis, unspecified site: Secondary | ICD-10-CM | POA: Insufficient documentation

## 2023-11-25 DIAGNOSIS — Z8249 Family history of ischemic heart disease and other diseases of the circulatory system: Secondary | ICD-10-CM | POA: Insufficient documentation

## 2023-11-25 DIAGNOSIS — I10 Essential (primary) hypertension: Secondary | ICD-10-CM | POA: Diagnosis not present

## 2023-11-25 HISTORY — PX: PENILE PROSTHESIS IMPLANT: SHX240

## 2023-11-25 LAB — BASIC METABOLIC PANEL WITH GFR
Anion gap: 10 (ref 5–15)
BUN: 20 mg/dL (ref 8–23)
CO2: 22 mmol/L (ref 22–32)
Calcium: 9 mg/dL (ref 8.9–10.3)
Chloride: 106 mmol/L (ref 98–111)
Creatinine, Ser: 1.1 mg/dL (ref 0.61–1.24)
GFR, Estimated: >60 mL/min (ref >60)
Glucose, Bld: 106 mg/dL — ABNORMAL HIGH (ref 70–99)
Potassium: 4.2 mmol/L (ref 3.5–5.1)
Sodium: 138 mmol/L (ref 135–145)

## 2023-11-25 SURGERY — INSERTION, PENILE PROSTHESIS
Anesthesia: General

## 2023-11-25 MED ORDER — LIDOCAINE 2% (20 MG/ML) 5 ML SYRINGE
INTRAMUSCULAR | Status: DC | PRN
  Administered 2023-11-25: 100 mg via INTRAVENOUS

## 2023-11-25 MED ORDER — FENTANYL CITRATE (PF) 100 MCG/2ML IJ SOLN
25.0000 ug | INTRAMUSCULAR | Status: DC | PRN
  Administered 2023-11-25: 25 ug via INTRAVENOUS
  Administered 2023-11-25: 50 ug via INTRAVENOUS
  Administered 2023-11-25: 25 ug via INTRAVENOUS
  Administered 2023-11-25: 50 ug via INTRAVENOUS

## 2023-11-25 MED ORDER — MIDAZOLAM HCL (PF) 2 MG/2ML IJ SOLN
INTRAMUSCULAR | Status: DC | PRN
  Administered 2023-11-25 (×2): 1 mg via INTRAVENOUS

## 2023-11-25 MED ORDER — VASOPRESSIN 20 UNIT/ML IV SOLN
INTRAVENOUS | Status: AC
  Filled 2023-11-25: qty 1

## 2023-11-25 MED ORDER — FENTANYL CITRATE (PF) 100 MCG/2ML IJ SOLN: INTRAMUSCULAR | Status: DC | PRN

## 2023-11-25 MED ORDER — FLUCONAZOLE IN SODIUM CHLORIDE 200-0.9 MG/100ML-% IV SOLN
INTRAVENOUS | Status: DC | PRN
  Administered 2023-11-25: 200 mg via INTRAVENOUS

## 2023-11-25 MED ORDER — OXYCODONE HCL 5 MG PO TABS
5.0000 mg | ORAL_TABLET | Freq: Once | ORAL | Status: AC | PRN
  Administered 2023-11-25: 5 mg via ORAL

## 2023-11-25 MED ORDER — MIDAZOLAM HCL 2 MG/2ML IJ SOLN
INTRAMUSCULAR | Status: AC
  Filled 2023-11-25: qty 2

## 2023-11-25 MED ORDER — FENTANYL CITRATE (PF) 250 MCG/5ML IJ SOLN
INTRAMUSCULAR | Status: AC
  Filled 2023-11-25: qty 5

## 2023-11-25 MED ORDER — ALBUMIN HUMAN 5 % IV SOLN: INTRAVENOUS | Status: DC | PRN

## 2023-11-25 MED ORDER — ONDANSETRON HCL 4 MG/2ML IJ SOLN: 4.0000 mg | Freq: Once | INTRAMUSCULAR | Status: DC | PRN

## 2023-11-25 MED ORDER — ACETAMINOPHEN 500 MG PO TABS: 1000.0000 mg | ORAL_TABLET | Freq: Three times a day (TID) | ORAL | 0 refills | Status: AC

## 2023-11-25 MED ORDER — GENTAMICIN SULFATE 40 MG/ML IJ SOLN
5.0000 mg/kg | INTRAVENOUS | Status: AC
Start: 2023-11-25 — End: 2023-11-25
  Administered 2023-11-25: 33661.2 mg via INTRAVENOUS
  Filled 2023-11-25: qty 10.25

## 2023-11-25 MED ORDER — FENTANYL CITRATE (PF) 250 MCG/5ML IJ SOLN
INTRAMUSCULAR | Status: DC | PRN
  Administered 2023-11-25 (×5): 50 ug via INTRAVENOUS

## 2023-11-25 MED ORDER — EPHEDRINE SULFATE (PRESSORS) 25 MG/5ML IV SOSY
PREFILLED_SYRINGE | INTRAVENOUS | Status: DC | PRN
  Administered 2023-11-25 (×7): 10 mg via INTRAVENOUS

## 2023-11-25 MED ORDER — STERILE WATER FOR IRRIGATION IR SOLN
Status: DC | PRN
  Administered 2023-11-25: 1000 mL

## 2023-11-25 MED ORDER — PROPOFOL 10 MG/ML IV BOLUS
INTRAVENOUS | Status: AC
  Filled 2023-11-25: qty 20

## 2023-11-25 MED ORDER — OXYCODONE HCL 5 MG PO TABS
5.0000 mg | ORAL_TABLET | Freq: Four times a day (QID) | ORAL | 0 refills | Status: AC | PRN
Start: 2023-11-25 — End: 2023-11-29

## 2023-11-25 MED ORDER — MUPIROCIN 2 % EX OINT
1.0000 | TOPICAL_OINTMENT | Freq: Once | CUTANEOUS | Status: AC
  Administered 2023-11-25: 1 via NASAL
  Filled 2023-11-25 (×2): qty 22

## 2023-11-25 MED ORDER — DEXAMETHASONE SOD PHOSPHATE PF 10 MG/ML IJ SOLN
INTRAMUSCULAR | Status: DC | PRN
  Administered 2023-11-25: 10 mg via INTRAVENOUS

## 2023-11-25 MED ORDER — CHLORHEXIDINE GLUCONATE 4 % EX SOLN: Freq: Once | CUTANEOUS | Status: DC

## 2023-11-25 MED ORDER — ONDANSETRON HCL 4 MG/2ML IJ SOLN
INTRAMUSCULAR | Status: DC | PRN
  Administered 2023-11-25: 4 mg via INTRAVENOUS

## 2023-11-25 MED ORDER — LACTATED RINGERS IV SOLN: INTRAVENOUS | Status: DC

## 2023-11-25 MED ORDER — CHLORHEXIDINE GLUCONATE 0.12 % MT SOLN
OROMUCOSAL | Status: AC
  Filled 2023-11-25: qty 15

## 2023-11-25 MED ORDER — OXYCODONE HCL 5 MG/5ML PO SOLN: 5.0000 mg | Freq: Once | ORAL | Status: AC | PRN

## 2023-11-25 MED ORDER — BUPIVACAINE HCL (PF) 0.5 % IJ SOLN
INTRAMUSCULAR | Status: AC
  Filled 2023-11-25: qty 30

## 2023-11-25 MED ORDER — ACETAMINOPHEN 10 MG/ML IV SOLN: 1000.0000 mg | Freq: Once | INTRAVENOUS | Status: DC | PRN

## 2023-11-25 MED ORDER — CHLORHEXIDINE GLUCONATE 0.12 % MT SOLN
15.0000 mL | Freq: Once | OROMUCOSAL | Status: AC
  Administered 2023-11-25: 15 mL via OROMUCOSAL

## 2023-11-25 MED ORDER — FENTANYL CITRATE (PF) 100 MCG/2ML IJ SOLN
INTRAMUSCULAR | Status: AC
  Filled 2023-11-25: qty 2

## 2023-11-25 MED ORDER — FLUCONAZOLE IN SODIUM CHLORIDE 200-0.9 MG/100ML-% IV SOLN
200.0000 mg | INTRAVENOUS | Status: DC
  Filled 2023-11-25: qty 100

## 2023-11-25 MED ORDER — IRRISEPT - 450ML BOTTLE WITH 0.05% CHG IN STERILE WATER, USP 99.95% OPTIME
TOPICAL | Status: DC | PRN
  Administered 2023-11-25: 900 mL
  Administered 2023-11-25: 450 mL

## 2023-11-25 MED ORDER — ORAL CARE MOUTH RINSE: 15.0000 mL | Freq: Once | OROMUCOSAL | Status: AC

## 2023-11-25 MED ORDER — CELECOXIB 200 MG PO CAPS: 200.0000 mg | ORAL_CAPSULE | Freq: Two times a day (BID) | ORAL | 1 refills | Status: AC | PRN

## 2023-11-25 MED ORDER — LIDOCAINE HCL 1 % IJ SOLN
INTRAMUSCULAR | Status: DC | PRN
  Administered 2023-11-25: 20 mL via SURGICAL_CAVITY

## 2023-11-25 MED ORDER — VANCOMYCIN HCL 1000 MG IV SOLR
INTRAVENOUS | Status: DC | PRN
Start: 2023-11-25 — End: 2023-11-25
  Administered 2023-11-25: 1500 mg via INTRAVENOUS

## 2023-11-25 MED ORDER — SODIUM CHLORIDE 0.9 % IR SOLN
Status: DC | PRN
  Administered 2023-11-25: 1000 mL

## 2023-11-25 MED ORDER — LIDOCAINE HCL (PF) 2 % IJ SOLN: INTRAMUSCULAR | Status: DC | PRN

## 2023-11-25 MED ORDER — LIDOCAINE HCL (PF) 1 % IJ SOLN
INTRAMUSCULAR | Status: AC
  Filled 2023-11-25: qty 30

## 2023-11-25 MED ORDER — SODIUM CHLORIDE 0.9 % IV SOLN
INTRAVENOUS | Status: DC
  Filled 2023-11-25: qty 10

## 2023-11-25 MED ORDER — PROPOFOL 10 MG/ML IV BOLUS
INTRAVENOUS | Status: DC | PRN
  Administered 2023-11-25: 100 mg via INTRAVENOUS

## 2023-11-25 MED ORDER — OXYCODONE HCL 5 MG PO TABS
ORAL_TABLET | ORAL | Status: AC
  Filled 2023-11-25: qty 1

## 2023-11-25 MED ORDER — SULFAMETHOXAZOLE-TRIMETHOPRIM 800-160 MG PO TABS: 1.0000 | ORAL_TABLET | Freq: Two times a day (BID) | ORAL | 0 refills | Status: DC

## 2023-11-25 MED ORDER — VANCOMYCIN HCL 1500 MG/300ML IV SOLN
1500.0000 mg | INTRAVENOUS | Status: AC
  Administered 2023-11-25: 1500 mg via INTRAVENOUS
  Filled 2023-11-25 (×2): qty 300

## 2023-11-25 SURGICAL SUPPLY — 49 items
BAG URINE DRAIN 2000ML AR STRL (UROLOGICAL SUPPLIES) IMPLANT
BLADE SURG 15 STRL LF DISP TIS (BLADE) ×2 IMPLANT
BNDG GAUZE DERMACEA FLUFF 4 (GAUZE/BANDAGES/DRESSINGS) ×2 IMPLANT
BRIEF MESH DISP 2XL (UNDERPADS AND DIAPERS) ×2 IMPLANT
CATH COUDE 5CC RIBBED (CATHETERS) ×2 IMPLANT
CHLORAPREP W/TINT 26 (MISCELLANEOUS) ×4 IMPLANT
COVER BACK TABLE 60X90IN (DRAPES) ×2 IMPLANT
COVER MAYO STAND STRL (DRAPES) ×4 IMPLANT
DERMABOND ADVANCED .7 DNX12 (GAUZE/BANDAGES/DRESSINGS) ×2 IMPLANT
DRAIN CHANNEL 10F 3/8 F FF (DRAIN) ×2 IMPLANT
DRAIN WND 3/16 RDEND PERF LF (DRAIN) ×2 IMPLANT
DRAPE INCISE IOBAN 66X45 STRL (DRAPES) ×2 IMPLANT
DRAPE LAPAROTOMY 100X72 PEDS (DRAPES) ×2 IMPLANT
DRAPE UTILITY XL STRL (DRAPES) ×2 IMPLANT
DRSG TEGADERM 4X4.75 (GAUZE/BANDAGES/DRESSINGS) ×2 IMPLANT
EVACUATOR DRAINAGE 7X20 100CC (MISCELLANEOUS) ×2 IMPLANT
EVACUATOR SILICONE 100CC (DRAIN) ×2 IMPLANT
GAUZE 4X4 16PLY ~~LOC~~+RFID DBL (SPONGE) ×2 IMPLANT
GAUZE SPONGE 4X4 12PLY STRL (GAUZE/BANDAGES/DRESSINGS) ×2 IMPLANT
GLOVE BIO SURGEON STRL SZ7 (GLOVE) ×2 IMPLANT
GLOVE BIO SURGEON STRL SZ7.5 (GLOVE) ×4 IMPLANT
GLOVE BIOGEL PI IND STRL 7.0 (GLOVE) ×2 IMPLANT
GOWN STRL REUS W/ TWL XL LVL3 (GOWN DISPOSABLE) ×2 IMPLANT
HOLDER FOLEY CATH W/STRAP (MISCELLANEOUS) IMPLANT
KIT ACCESSORY AMS 700 PUMP (Urological Implant) IMPLANT
KIT BASIN OR (CUSTOM PROCEDURE TRAY) ×2 IMPLANT
LAVAGE JET IRRISEPT WOUND (IRRIGATION / IRRIGATOR) ×4 IMPLANT
NDL HYPO 22X1.5 SAFETY MO (MISCELLANEOUS) ×2 IMPLANT
NEEDLE HYPO 22X1.5 SAFETY MO (MISCELLANEOUS) ×1 IMPLANT
PENCIL SMOKE EVACUATOR (MISCELLANEOUS) ×2 IMPLANT
PLUG CATH AND CAP STRL 200 (CATHETERS) ×2 IMPLANT
PROSTHESIS PENIL AMS 700 CX 24 (Urological Implant) IMPLANT
RESERVOIR FLAT IZ 100ML (Miscellaneous) IMPLANT
RETRACTOR DEEP SCROTAL PENILE (MISCELLANEOUS) IMPLANT
RETRACTOR WILSON SYSTEM (INSTRUMENTS) IMPLANT
SET COLLECT BLD 21X.75 12 PB G (NEEDLE) ×2 IMPLANT
SLEEVE SCD COMPRESS KNEE MED (STOCKING) ×2 IMPLANT
SOLN STERILE WATER BTL 1000 ML (IV SOLUTION) ×2 IMPLANT
SUT ETHILON 3 0 PS 1 (SUTURE) IMPLANT
SUT MNCRL AB 4-0 PS2 18 (SUTURE) ×2 IMPLANT
SUT VIC AB 2-0 UR6 27 (SUTURE) ×4 IMPLANT
SUT VIC AB 3-0 SH 27X BRD (SUTURE) ×4 IMPLANT
SYR 10ML LL (SYRINGE) ×6 IMPLANT
SYR 50ML LL SCALE MARK (SYRINGE) ×6 IMPLANT
SYR BULB IRRIG 60ML STRL (SYRINGE) ×2 IMPLANT
SYR CONTROL 10ML LL (SYRINGE) ×2 IMPLANT
TOWEL GREEN STERILE FF (TOWEL DISPOSABLE) ×4 IMPLANT
TUBE CONNECTING 20X1/4 (TUBING) ×2 IMPLANT
YANKAUER SUCT BULB TIP NO VENT (SUCTIONS) ×2 IMPLANT

## 2023-11-25 NOTE — Op Note (Signed)
 PATIENT:  [name]  PRE-OPERATIVE DIAGNOSIS:  Organic erectile dysfunction  POST-OPERATIVE DIAGNOSIS:  Same  PROCEDURE:   3 piece inflatable penile prosthesis (BS/AMS) Injection of pharmacoagent into penis  SURGEON:  Herlene Foot MD  ASST: Maurilio Agar, MD  INDICATION: He has had long-standing organic erectile dysfunction and refractory to other modes of treatment. He has elected to proceed with prosthesis implantation.  ANESTHESIA:  General  EBL:  50 cc  Device: 3 piece AMS CX 700: 99 cc reservoir, 24 cm cylinders and 0 cm rear-tip extenders on right and left sides  LOCAL MEDICATIONS USED:   penile block done with 10 cc lidocaine /marcaine  mixture 10 cc injected into corpora directly via butterfly needle  SPECIMEN: None  Description of procedure: The patient was taken to the major operating room, placed on the table and administered general anesthesia in the supine position. His genitalia was then prepped with chlorhexidine  x 2. He was draped in the usual sterile fashion, and I used Ioban on the field. An official timeout was then performed.  A dorsal penile block was performed. A butterfly needle was then used to inject normal saline into the penis to give an artificial erection. There was no clinically significant curvature or deformity. I then injected 10 cc of lidocaine /marcaine  into the penis.   A 14 French coude catheter was then placed in the bladder and the bladder was drained and the catheter was plugged. A midline penoscrotal incision was then made and the dissection was carried down to the corpora and urethra. The lonestar retractor was positioned so as to have excellent exposure. 2-0 Vicryl sutures were then placed proximally in each corpus cavernosum to serve as stay sutures. An incision was then made in the corpus cavernosum first on the left-hand side with the bovie. Tamra were used to gently dilate the opening. I then dilated the corpus cavernosum with the a 12 Fr  brooks dilator distally and proximally. Field goal post tests were performed and there was no evidence of perforations or crossover. I then irrigated the corpus cavernosum with antibiotic solution and measured the distance proximally and distally from the stay suture and was found to be 11 and 13 cm, respectively.I then turned my attention to the contralateral corpus cavernosum and placed my stay sutures, made my corporotomy and dilated the corpus cavernosum in an identical fashion. This was measured and also was found to be 11 cm proximally and 13 distally. It was irrigated with anastomotic solution as was the scrotum. I then chose an 24 cm cylinder set with 0 cm rear-tip extenders and these were prepped while I prepared the site for reservoir placement.  I then digitally probed into the Left external inguinal ring. My finger was used to poke through the posterior wall of the ring. I used my finger to ensure I was in the appropriate space through the conjoint tendon, and to clear room for the reservoir. I dissected cephalad with a sponge stick, being careful to hug the underside of the rectus. I irrigated the space with anastomotic solution and then placed the reservoir in this location. I then filled the reservoir with 99 cc of sterile saline, and checked to confirm proper position. There was minimal backpressure with the reservoir max-filled.  Attention was redirected to the corporotomies where the cylinders were then placed by first fixing the suture to the distal aspect of the right cylinder to a straight needle. This was then loaded on the [facility] inserter and passed through the corporotomy  and distally. I then advanced the straight needle with the Furlow inserter out through the glans and this was grasped with a hemostat and pulled through the glans and the suture was secured with a hemostat. I then performed an identical maneuver on the contralateral side. After this was performed I irrigated both corpus  cavernosum; there was no evidence of urethral perforation. I inserted the distal portion of the cylinder through the corporotomies and pulled this to the end of the corpora with the suture. The proximal aspect with the rear-tip extender was then passed through the corporotomy and into the seated position on each side. I then connected reservoir tubing to a syringe filled with sterile saline and inflated the device. I noted a good straight erection with both cylinders equidistant under the glans and no buckling of the cylinders. I therefore deflated the device and closed the corporotomies with used my previously placed stay sutures.   I then grasped the scrotal skin in the midline with a babcock, and used a hemostat to dissect down to the dependent-most portion of the scrotum. The nasal speculum was inserted into this space, and facilitated placement of the pump. The cylinder was then connected to the pump after excising the excess tubing with appropriate shodded hemostats in place and then I used the supplied connectors to make the connection. I then again cycled the device with the pump and it cycled properly. I deflated the device and pumped it up about three quarters of the way to aid with hemostasis. I irrigated the wound one last time with antibiotic irrigation and then closed the deep scrotal tissue over the tubing and pump with running 3-0 monocryl suture. I placed a 10 Fr blake drain over the corporotomies. A second layer was then closed over this first layer with running 3- 0 monocryl, and running skin suture w/ 4-0 monocryl performed. Incision dressed with dermabond.  A mummy wrap was applied. The catheter was removed, and drain connected to suction bulb and the patient was awakened and taken recovery room in stable and satisfactory condition. He tolerated the procedure well and there were no intraoperative complications. Needle sponge and instrument counts were correct at the end of the operation.

## 2023-11-25 NOTE — Anesthesia Preprocedure Evaluation (Signed)
 Anesthesia Evaluation  Patient identified by MRN, date of birth, ID band Patient awake    Reviewed: Allergy & Precautions, NPO status , Patient's Chart, lab work & pertinent test results, reviewed documented beta blocker date and time   History of Anesthesia Complications Negative for: history of anesthetic complications  Airway Mallampati: III  TM Distance: >3 FB     Dental no notable dental hx.    Pulmonary shortness of breath and with exertion, COPD, former smoker   breath sounds clear to auscultation       Cardiovascular hypertension, + angina  + CAD  + Valvular Problems/Murmurs  Rhythm:Regular Rate:Normal  IMPRESSIONS     1. Left ventricular ejection fraction, by estimation, is 60 to 65%. The  left ventricle has normal function. The left ventricle has no regional  wall motion abnormalities. There is mild left ventricular hypertrophy.  Left ventricular diastolic parameters  were normal. The average left ventricular global longitudinal strain is  -15.3 %. The global longitudinal strain is abnormal.   2. Right ventricular systolic function is normal. The right ventricular  size is normal.   3. The mitral valve is normal in structure. Mild to moderate mitral valve  regurgitation.   4. The aortic valve is tricuspid. Aortic valve regurgitation is mild to  moderate.   5. The inferior vena cava is normal in size with greater than 50%  respiratory variability, suggesting right atrial pressure of 3 mmHg.      Neuro/Psych neg Seizures  Neuromuscular disease    GI/Hepatic ,GERD  ,,(+) neg Cirrhosis        Endo/Other    Renal/GU Renal disease     Musculoskeletal  (+) Arthritis ,    Abdominal   Peds  Hematology   Anesthesia Other Findings   Reproductive/Obstetrics                              Anesthesia Physical Anesthesia Plan  ASA: 2  Anesthesia Plan: General   Post-op Pain  Management:    Induction: Intravenous  PONV Risk Score and Plan: 2 and Ondansetron  and Dexamethasone   Airway Management Planned: LMA  Additional Equipment:   Intra-op Plan:   Post-operative Plan: Extubation in OR  Informed Consent: I have reviewed the patients History and Physical, chart, labs and discussed the procedure including the risks, benefits and alternatives for the proposed anesthesia with the patient or authorized representative who has indicated his/her understanding and acceptance.     Dental advisory given  Plan Discussed with: CRNA  Anesthesia Plan Comments:          Anesthesia Quick Evaluation

## 2023-11-25 NOTE — Discharge Instructions (Addendum)
 Penile prosthesis postoperative instructions  Wound:  In most cases your incision will have absorbable sutures that will dissolve within the first 10-20 days. Some will fall out even earlier. Expect some redness as the sutures dissolved but this should occur only around the sutures. If there is generalized redness, especially with increasing pain or swelling, let us  know. The scrotum and penis will very likely get black and blue as the blood in the tissues spread. Sometimes the whole scrotum will turn colors. The black and blue is followed by a yellow and brown color. In time, all the discoloration will go away. In some cases some firm swelling in the area of the testicle and pump may persist for up to 4-6 weeks after the surgery and is considered normal in most cases.  Drain:   You may be discharged home with a drain in place. If so, you will be taught how to empty it and should keep track of the output. Additionally, you should call the office to arrange for an appointment to have it removed after a few days.   Diet:  You may return to your normal diet within 24 hours following your surgery. You may note some mild nausea and possibly vomiting the first 6-8 hours following surgery. This is usually due to the side effects of anesthesia, and will disappear quite soon. I would suggest clear liquids and a very light meal the first evening following your surgery.  Activity:  Your physical activity should be restricted the first 48 hours. During that time you should remain relatively inactive, moving about only when necessary. During the first 3 weeks following surgery you should avoid lifting any heavy objects (anything greater than 15 pounds), and avoid strenuous exercise. If you work, ask us  specifically about your restrictions, both for work and home. We will write a note to your employer if needed.  Avoid using your penis until your follow up visit with Dr Lovie, which will typically be around  3-4 weeks following the surgery. Most people are able to start cycling their device after that appointment, and can have intercourse soon thereafter.   You should plan to wear a tight pair of jockey shorts or an athletic supporter for the first 4-5 days, even to sleep. This will keep the scrotum immobilized to some degree and keep the swelling down.The position of your penis will determine what is most comfortable but I strongly urge you to keep the penis in the up position (toward your head). You should continue to tuck up your penis when possible for the first 3 months following surgery.  Ice packs should be placed on and off over the scrotum for the first 48 hours. Frozen peas or corn in a ZipLock bag can be frozen, used and re-frozen. Fifteen minutes on and 15 minutes off is a reasonable schedule. The ice is a good pain reliever and keeps the swelling down.  Hygiene:  You may shower 48 hours after your surgery. Tub bathing should be restricted until the wound is completely healed, typically around 2-3 weeks.  Medication:  You will be sent home with some type of pain medication. In many cases you will be sent home with a strong anti-inflammatory medication (Celebrex, Meloxicam ) and a narcotic pain pill (hydrocodone  or oxycodone ). You can also supplement these medications with tylenol  (acetaminophen ). If the pain medication you are sent home with does not control the pain, please notify the office Problems you should report to us :  Fever of 101.0 degrees  Fahrenheit or greater. Moderate or severe swelling under the skin incision or involving the scrotum. Drug reaction such as hives, a rash, nausea or vomiting.    Post Anesthesia Home Care Instructions  Activity: Get plenty of rest for the remainder of the day. A responsible individual must stay with you for 24 hours following the procedure.  For the next 24 hours, DO NOT: -Drive a car -Advertising copywriter -Drink alcoholic  beverages -Take any medication unless instructed by your physician -Make any legal decisions or sign important papers.  Meals: Start with liquid foods such as gelatin or soup. Progress to regular foods as tolerated. Avoid greasy, spicy, heavy foods. If nausea and/or vomiting occur, drink only clear liquids until the nausea and/or vomiting subsides. Call your physician if vomiting continues.  Special Instructions/Symptoms: Your throat may feel dry or sore from the anesthesia or the breathing tube placed in your throat during surgery. If this causes discomfort, gargle with warm salt water . The discomfort should disappear within 24 hours.

## 2023-11-25 NOTE — Anesthesia Postprocedure Evaluation (Signed)
 Anesthesia Post Note  Patient: [name]  Procedure(s) Performed: INSERTION, PENILE PROSTHESIS     Patient location during evaluation: PACU Anesthesia Type: General Level of consciousness: awake and alert Pain management: pain level controlled Vital Signs Assessment: post-procedure vital signs reviewed and stable Respiratory status: spontaneous breathing, nonlabored ventilation, respiratory function stable and patient connected to nasal cannula oxygen Cardiovascular status: blood pressure returned to baseline and stable Postop Assessment: no apparent nausea or vomiting Anesthetic complications: no   No notable events documented.  Last Vitals:  Vitals:   11/25/23 1044 11/25/23 1048  BP:    Pulse: 81 84  Resp: 14 15  Temp: 36.4 C   SpO2: 96% 92%    Last Pain:  Vitals:   11/25/23 1139  TempSrc:   PainSc: 5                  Lynwood MARLA Cornea

## 2023-11-25 NOTE — Anesthesia Procedure Notes (Signed)
 Procedure Name: LMA Insertion Date/Time: 11/25/2023 7:44 AM  Performed by: Leopoldo Wanda DASEN, CRNAPre-anesthesia Checklist: Patient identified, Emergency Drugs available, Suction available and Patient being monitored Patient Re-evaluated:Patient Re-evaluated prior to induction Oxygen Delivery Method: Circle System Utilized Preoxygenation: Pre-oxygenation with 100% oxygen Induction Type: IV induction Ventilation: Mask ventilation without difficulty LMA: LMA inserted LMA Size: 5.0 Number of attempts: 1 Airway Equipment and Method: Bite block Placement Confirmation: positive ETCO2 Tube secured with: Tape Dental Injury: Teeth and Oropharynx as per pre-operative assessment

## 2023-11-25 NOTE — H&P (Signed)
 H&P  History of Present Illness: [name] is a 73 y.o. year old M who presents today for insertion of an inflatable penile prosthesis  No acute complaints  Past Medical History:  Diagnosis Date   Adenomatous colon polyp 2004   Allergy    Anginal pain    Arthritis    Diverticulosis 2011   Dyspnea    Former smoker    GERD (gastroesophageal reflux disease)    HLD (hyperlipidemia)    Hyperplastic colon polyp 2004   Hypertension    Melanoma in situ [facility])     Past Surgical History:  Procedure Laterality Date   COLONOSCOPY  01/21/2009   SEVERAL    CORONARY STENT INTERVENTION N/A 10/04/2020   Procedure: CORONARY STENT INTERVENTION;  Surgeon: Anner Alm ORN, MD;  Location: [facility] INVASIVE CV LAB;  Service: Cardiovascular;  Laterality: N/A;   EYE SURGERY  Nov. 2018   JOINT REPLACEMENT  April 26 2020   LAMINECTOMY N/A 08/14/2022   Procedure: THORACIC LAMINECTOMY FOR TUMOR;  Surgeon: Gillie Duncans, MD;  Location: [facility] OR;  Service: Neurosurgery;  Laterality: N/A;   LEFT HEART CATH AND CORONARY ANGIOGRAPHY N/A 10/04/2020   Procedure: LEFT HEART CATH AND CORONARY ANGIOGRAPHY;  Surgeon: Anner Alm ORN, MD;  Location: [facility] INVASIVE CV LAB;  Service: Cardiovascular;  Laterality: N/A;   LEFT HEART CATH AND CORONARY ANGIOGRAPHY Left 11/30/2020   Procedure: LEFT HEART CATH AND CORONARY ANGIOGRAPHY;  Surgeon: Anner Alm ORN, MD;  Location: [facility] INVASIVE CV LAB;  Service: Cardiovascular;  Laterality: Left;   LYMPH NODE DISSECTION Bilateral 12/28/2021   Procedure: PELVIC LYMPH NODE DISSECTION;  Surgeon: Alvaro Hummer, MD;  Location: [facility] ORS;  Service: Urology;  Laterality: Bilateral;   ROBOT ASSISTED LAPAROSCOPIC RADICAL PROSTATECTOMY N/A 12/28/2021   Procedure: XI ROBOTIC ASSISTED LAPAROSCOPIC RADICAL PROSTATECTOMY AND INDOCYANINE GREEN DYE INJECTION;  Surgeon: Alvaro Hummer, MD;  Location: [facility] ORS;  Service: Urology;  Laterality: N/A;  3 HRS   TOOTH EXTRACTION     TOTAL KNEE ARTHROPLASTY Right  04/26/2020    Home Medications:  Current Meds  Medication Sig   losartan  (COZAAR ) 50 MG tablet Take 0.5 tablets (25 mg total) by mouth in the morning.   metoprolol  succinate (TOPROL -XL) 25 MG 24 hr tablet Take 1 tablet (25 mg total) by mouth daily.   Multiple Vitamins-Minerals (CENTRUM SILVER 50+MEN) TABS Take 1 tablet by mouth daily.   rosuvastatin  (CRESTOR ) 40 MG tablet Take 1 tablet (40 mg total) by mouth daily.   tiZANidine  (ZANAFLEX ) 4 MG tablet Take 1 tablet (4 mg total) by mouth every 6 (six) hours as needed for muscle spasms.    Allergies:  Allergies  Allergen Reactions   Levaquin [Levofloxacin In D5w] Shortness Of Breath    Family History  Problem Relation Age of Onset   Heart disease Mother        died in her 42's   Lung cancer Father    Cancer Father    Diabetes Daughter     Social History:  reports that he quit smoking about 25 years ago. His smoking use included cigarettes and cigars. He started smoking about 55 years ago. He has a 30 pack-year smoking history. His smokeless tobacco use includes chew. He reports current alcohol use of about 2.0 standard drinks of alcohol per week. He reports that he does not use drugs.  ROS: A complete review of systems was performed.  All systems are negative except for pertinent findings as noted.  Physical Exam:  Vital  signs in last 24 hours: Temp:  [97.6 F (36.4 C)] 97.6 F (36.4 C) (11/04 0603) Pulse Rate:  [70] 70 (11/04 0603) Resp:  [18] 18 (11/04 0603) BP: (143)/(68) 143/68 (11/04 0603) SpO2:  [96 %] 96 % (11/04 0603) Weight:  [96.2 kg] 96.2 kg (11/04 0603) Constitutional:  Alert and oriented, No acute distress Cardiovascular: Regular rate and rhythm Respiratory: Normal respiratory effort, Lungs clear bilaterally GI: Abdomen is soft, nontender, nondistended, no abdominal masses Lymphatic: No lymphadenopathy Neurologic: Grossly intact, no focal deficits Psychiatric: Normal mood and affect   Laboratory Data:   No results for input(s): WBC, HGB, HCT, PLT in the last 72 hours.  Recent Labs    11/25/23 0615  NA 138  K 4.2  CL 106  GLUCOSE 106*  BUN 20  CALCIUM  9.0  CREATININE 1.10     Results for orders placed or performed during the hospital encounter of 11/25/23 (from the past 24 hours)  Basic metabolic panel per protocol     Status: Abnormal   Collection Time: 11/25/23  6:15 AM  Result Value Ref Range   Sodium 138 135 - 145 mmol/L   Potassium 4.2 3.5 - 5.1 mmol/L   Chloride 106 98 - 111 mmol/L   CO2 22 22 - 32 mmol/L   Glucose, Bld 106 (H) 70 - 99 mg/dL   BUN 20 8 - 23 mg/dL   Creatinine, Ser 8.89 0.61 - 1.24 mg/dL   Calcium  9.0 8.9 - 10.3 mg/dL   GFR, Estimated >39 >39 mL/min   Anion gap 10 5 - 15   No results found for this or any previous visit (from the past 240 hours).  Renal Function: Recent Labs    11/25/23 0615  CREATININE 1.10   Estimated Creatinine Clearance: 67.4 mL/min (by C-G formula based on SCr of 1.1 mg/dL).  Radiologic Imaging: No results found.  Assessment:  [name] is a 73 y.o. year old M with ED refractory to other medical treatments  Plan:  To OR as planned for IPP. Procedure and risks reviewed, including but not limited to bleeding, infection, implant infection, implant malfunction, implant malplacement, erosion, damage to adjacent structures, pain, urinary retention, inability to place. All questions answered   Herlene Foot, MD 11/25/2023, 7:29 AM  [facility] Pager: [phone]

## 2023-11-25 NOTE — Transfer of Care (Signed)
 Immediate Anesthesia Transfer of Care Note  Patient: [name]  Procedure(s) Performed: INSERTION, PENILE PROSTHESIS  Patient Location: PACU  Anesthesia Type:General  Level of Consciousness: awake, alert , and oriented  Airway & Oxygen Therapy: Patient Spontanous Breathing and Patient connected to face mask oxygen  Post-op Assessment: Report given to RN and Post -op Vital signs reviewed and stable  Post vital signs: Reviewed and stable  Last Vitals:  Vitals Value Taken Time  BP 161/91 11/25/23 09:45  Temp    Pulse 85 11/25/23 09:46  Resp 16 11/25/23 09:46  SpO2 98 % 11/25/23 09:46  Vitals shown include unfiled device data.  Last Pain:  Vitals:   11/25/23 0603  TempSrc: Oral  PainSc: 0-No pain      Patients Stated Pain Goal: 5 (11/25/23 0603)  Complications: No notable events documented.

## 2023-11-26 ENCOUNTER — Encounter (HOSPITAL_COMMUNITY): Payer: Self-pay | Admitting: Urology

## 2023-12-01 ENCOUNTER — Ambulatory Visit: Payer: Self-pay

## 2023-12-01 NOTE — Telephone Encounter (Signed)
 FYI Only or Action Required?: FYI only for provider: patient will likely go to urgent care.  Patient was last seen in primary care on 08/01/2023 by Duanne Butler DASEN, MD.  Called Nurse Triage reporting Rash.  Symptoms began today.  Interventions attempted: Nothing.  Symptoms are: unchanged.  Triage Disposition: See Physician Within 24 Hours  Patient/caregiver understands and will follow disposition?: No appointment, likely going to urgent care    Copied from CRM #[id number]. Topic: Clinical - Red Word Triage >> Dec 01, 2023  3:31 PM [place] MATSU wrote: Red Word that prompted transfer to Nurse Triage: rash broke out after lunch upper body.  Very itching      Reason for Disposition  SEVERE itching (i.e., interferes with sleep, normal activities or school)  Answer Assessment - Initial Assessment Questions Patient requesting an appointment today. I advised there are no appointment's today. Patient states he will try to go to urgent care. Patient instructed to call back for new or worsening symptoms or if he decides to schedule an appointment. Patient verbalized understanding and agreement with this plan.     1. APPEARANCE of RASH: What does the rash look like? (e.g., blisters, dry flaky skin, red spots, redness, sores)     Red with welts  2. SIZE: How big are the spots? (e.g., tip of pen, eraser, coin; inches, centimeters)     Various size areas my whole stomach is red looking 3. LOCATION: Where is the rash located?     Upper body  4. COLOR: What color is the rash? (Note: It is difficult to assess rash color in people with darker-colored skin. When this situation occurs, simply ask the caller to describe what they see.)     Red  5. ONSET: When did the rash begin?     Today since lunch  6. FEVER: Do you have a fever? If Yes, ask: What is your temperature, how was it measured, and when did it start?     No 7. ITCHING: Does the rash itch? If Yes, ask: How bad is the  itch? (Scale 1-10; or mild, moderate, severe)     Moderate  8. CAUSE: What do you think is causing the rash?     Unsure  9. MEDICINE FACTORS: Have you started any new medicines within the last 2 weeks? (e.g., antibiotics)      Bactrim  and Celebrex 11/4 10. OTHER SYMPTOMS: Do you have any other symptoms? (e.g., dizziness, headache, sore throat, joint pain)       No  Protocols used: Rash or Redness - [facility]

## 2023-12-02 DIAGNOSIS — T7840XA Allergy, unspecified, initial encounter: Secondary | ICD-10-CM | POA: Diagnosis not present

## 2023-12-02 NOTE — Progress Notes (Signed)
   12/02/2023  Patient ID: [name], male   DOB: Sep 06, 1950, 73 y.o.   MRN: [id number]  Pharmacy Quality Measure Review  This patient is appearing on a report for being at risk of failing the adherence measure for cholesterol (statin) medications this calendar year.   Medication: rosuvastatin  40mg  Last fill date: 10/05/2023 for 90 day supply  set to fail 2025, reminder set to check in january to ensure he has active rx to cover the year.   Will collaborate with provider to facilitate refill needs.  Lang Sieve, PharmD, BCGP Clinical Pharmacist  [phone]

## 2023-12-25 DIAGNOSIS — N5201 Erectile dysfunction due to arterial insufficiency: Secondary | ICD-10-CM | POA: Diagnosis not present

## 2023-12-26 ENCOUNTER — Encounter: Payer: Self-pay | Admitting: Family Medicine

## 2023-12-26 ENCOUNTER — Ambulatory Visit: Admitting: Family Medicine

## 2023-12-26 VITALS — BP 150/90 | HR 68 | Temp 97.7°F | Ht 69.0 in | Wt 221.4 lb

## 2023-12-26 DIAGNOSIS — B079 Viral wart, unspecified: Secondary | ICD-10-CM

## 2023-12-26 MED ORDER — ZOLPIDEM TARTRATE 10 MG PO TABS: 10.0000 mg | ORAL_TABLET | Freq: Every evening | ORAL | 1 refills | Status: DC | PRN

## 2023-12-26 NOTE — Progress Notes (Signed)
 Subjective:    Patient ID: [name], male    DOB: [birth year], 73 y.o.   MRN: [id number] Patient has a brown wartlike lesion on his left medial bicep.  He states that recently this started to grow and change.  Is approximately 7 mm in diameter.  The superior portion is erythematous and scaly.  The bottom portion appears to be a seborrheic keratosis.  He denies any bleeding.  He does request Ambien  to help him sleep.  He is having a difficult time sleeping this time a year.  He is missing his wife.  Their anniversary was today.  He is very sad as Christmas approaches and she is gone. Past Medical History:  Diagnosis Date   Adenomatous colon polyp 2004   Allergy    Anginal pain    Arthritis    Diverticulosis 2011   Dyspnea    Former smoker    GERD (gastroesophageal reflux disease)    HLD (hyperlipidemia)    Hyperplastic colon polyp 2004   Hypertension    Melanoma in situ [facility])    Past Surgical History:  Procedure Laterality Date   COLONOSCOPY  01/21/2009   SEVERAL    CORONARY STENT INTERVENTION N/A 10/04/2020   Procedure: CORONARY STENT INTERVENTION;  Surgeon: Anner Alm ORN, MD;  Location: [facility] INVASIVE CV LAB;  Service: Cardiovascular;  Laterality: N/A;   EYE SURGERY  Nov. 2018   JOINT REPLACEMENT  April 26 2020   LAMINECTOMY N/A 08/14/2022   Procedure: THORACIC LAMINECTOMY FOR TUMOR;  Surgeon: Gillie Duncans, MD;  Location: [facility] OR;  Service: Neurosurgery;  Laterality: N/A;   LEFT HEART CATH AND CORONARY ANGIOGRAPHY N/A 10/04/2020   Procedure: LEFT HEART CATH AND CORONARY ANGIOGRAPHY;  Surgeon: Anner Alm ORN, MD;  Location: [facility] INVASIVE CV LAB;  Service: Cardiovascular;  Laterality: N/A;   LEFT HEART CATH AND CORONARY ANGIOGRAPHY Left 11/30/2020   Procedure: LEFT HEART CATH AND CORONARY ANGIOGRAPHY;  Surgeon: Anner Alm ORN, MD;  Location: [facility] INVASIVE CV LAB;  Service: Cardiovascular;  Laterality: Left;   LYMPH NODE DISSECTION Bilateral 12/28/2021   Procedure: PELVIC LYMPH  NODE DISSECTION;  Surgeon: Alvaro Hummer, MD;  Location: [facility] ORS;  Service: Urology;  Laterality: Bilateral;   PENILE PROSTHESIS IMPLANT N/A 11/25/2023   Procedure: INSERTION, PENILE PROSTHESIS;  Surgeon: Lovie Arlyss LITTIE, MD;  Location: [facility] OR;  Service: Urology;  Laterality: N/A;   ROBOT ASSISTED LAPAROSCOPIC RADICAL PROSTATECTOMY N/A 12/28/2021   Procedure: XI ROBOTIC ASSISTED LAPAROSCOPIC RADICAL PROSTATECTOMY AND INDOCYANINE GREEN DYE INJECTION;  Surgeon: Alvaro Hummer, MD;  Location: [facility] ORS;  Service: Urology;  Laterality: N/A;  3 HRS   TOOTH EXTRACTION     TOTAL KNEE ARTHROPLASTY Right 04/26/2020   Current Outpatient Medications on File Prior to Visit  Medication Sig Dispense Refill   acetaminophen  (TYLENOL ) 500 MG tablet Take 2 tablets (1,000 mg total) by mouth every 8 (eight) hours. 50 tablet 0   losartan  (COZAAR ) 50 MG tablet Take 0.5 tablets (25 mg total) by mouth in the morning. 45 tablet 1   metoprolol  succinate (TOPROL -XL) 25 MG 24 hr tablet Take 1 tablet (25 mg total) by mouth daily. 90 tablet 0   Multiple Vitamins-Minerals (CENTRUM SILVER 50+MEN) TABS Take 1 tablet by mouth daily.     nitroGLYCERIN  (NITROSTAT ) 0.4 MG SL tablet Place 1 tablet (0.4 mg total) under the tongue every 5 (five) minutes as needed for chest pain. 50 tablet 3   predniSONE  (DELTASONE ) 20 MG tablet 3 tabs poqday 1-2, 2  tabs poqday 3-4, 1 tab poqday 5-6 12 tablet 0   rosuvastatin  (CRESTOR ) 40 MG tablet Take 1 tablet (40 mg total) by mouth daily. 90 tablet 3   sulfamethoxazole -trimethoprim  (BACTRIM  DS) 800-160 MG tablet Take 1 tablet by mouth 2 (two) times daily. 14 tablet 0   tiZANidine  (ZANAFLEX ) 4 MG tablet Take 1 tablet (4 mg total) by mouth every 6 (six) hours as needed for muscle spasms. 30 tablet 0   cetirizine  (ZYRTEC ) 10 MG tablet Take 1 tablet (10 mg total) by mouth daily. (Patient not taking: Reported on 12/26/2023) 90 tablet 2   clotrimazole  (LOTRIMIN  AF) 1 % cream Apply 1 Application topically 2  (two) times daily. (Patient not taking: Reported on 12/26/2023) 30 g 2   No current facility-administered medications on file prior to visit.   Allergies  Allergen Reactions   Levaquin [Levofloxacin In D5w] Shortness Of Breath   Social History   Socioeconomic History   Marital status: Married    Spouse name: [name]   Number of children: 2   Years of education: Not on file   Highest education level: 12th grade  Occupational History   Occupation: part Investment Banker, Corporate: [employer]  Tobacco Use   Smoking status: Former    Current packs/day: 0.00    Average packs/day: 1 pack/day for 30.0 years (30.0 ttl pk-yrs)    Types: Cigarettes, Cigars    Start date: 05/12/1968    Quit date: 05/13/1998    Years since quitting: 25.6   Smokeless tobacco: Current    Types: Chew  Vaping Use   Vaping status: Never Used  Substance and Sexual Activity   Alcohol use: Yes    Alcohol/week: 2.0 standard drinks of alcohol    Types: 2 Cans of beer per week   Drug use: No   Sexual activity: Not Currently  Other Topics Concern   Not on file  Social History Narrative   Married x 51 years   2 grandsons   Social Drivers of Corporate Investment Banker Strain: Low Risk  (11/20/2023)   Overall Financial Resource Strain (CARDIA)    Difficulty of Paying Living Expenses: Not hard at all  Food Insecurity: No Food Insecurity (11/20/2023)   Hunger Vital Sign    Worried About Running Out of Food in the Last Year: Never true    Ran Out of Food in the Last Year: Never true  Transportation Needs: No Transportation Needs (11/20/2023)   PRAPARE - Administrator, Civil Service (Medical): No    Lack of Transportation (Non-Medical): No  Physical Activity: Insufficiently Active (11/20/2023)   Exercise Vital Sign    Days of Exercise per Week: 2 days    Minutes of Exercise per Session: 10 min  Stress: No Stress Concern Present (11/20/2023)   [employer] of Occupational Health - Occupational Stress  Questionnaire    Feeling of Stress: Only a little  Social Connections: Moderately Integrated (11/20/2023)   Social Connection and Isolation Panel    Frequency of Communication with Friends and Family: More than three times a week    Frequency of Social Gatherings with Friends and Family: Twice a week    Attends Religious Services: More than 4 times per year    Active Member of [employer] or Organizations: Yes    Attends Banker Meetings: More than 4 times per year    Marital Status: Widowed  Intimate Partner Violence: Not At Risk (11/20/2023)   Humiliation, Afraid, Rape,  and Kick questionnaire    Fear of Current or Ex-Partner: No    Emotionally Abused: No    Physically Abused: No    Sexually Abused: No      Review of Systems  All other systems reviewed and are negative.      Objective:   Physical Exam Vitals reviewed.  Constitutional:      General: He is not in acute distress.    Appearance: He is well-developed. He is not diaphoretic.  HENT:     Head: Normocephalic and atraumatic.     Nose: No mucosal edema.     Right Sinus: No maxillary sinus tenderness or frontal sinus tenderness.     Left Sinus: No maxillary sinus tenderness or frontal sinus tenderness.  Eyes:     General: No visual field deficit. Cardiovascular:     Rate and Rhythm: Normal rate and regular rhythm.     Heart sounds: Normal heart sounds.  Pulmonary:     Effort: Pulmonary effort is normal. No respiratory distress.     Breath sounds: No wheezing, rhonchi or rales.  Musculoskeletal:       Arms:  Skin:    Findings: Lesion present.  Neurological:     Mental Status: He is alert.     Cranial Nerves: No dysarthria or facial asymmetry.     Sensory: Sensation is intact.     Motor: Motor function is intact. No atrophy, abnormal muscle tone, seizure activity or pronator drift.     Coordination: Coordination is intact.     Gait: Gait is intact.           Assessment & Plan:  Verrucous  lesion of skin - Plan: Pathology Report ([facility])  I anesthetized the lesion with 0.1% lidocaine  with epinephrine .  I removed the lesion using a razor blade achieved hemostasis with Drysol and a Band-Aid.  Patient tolerated the procedure without complication.  The lesion was sent to pathology in a labeled container.  I will give the patient Ambien  10 mg p.o. nightly as needed insomnia.  He can try half a tablet at first and increase to a full tablet if necessary

## 2023-12-29 LAB — PATHOLOGY REPORT

## 2023-12-29 LAB — TISSUE SPECIMEN

## 2023-12-30 ENCOUNTER — Ambulatory Visit: Payer: Self-pay | Admitting: Family Medicine

## 2024-01-05 ENCOUNTER — Other Ambulatory Visit: Payer: Self-pay | Admitting: Family Medicine

## 2024-02-05 ENCOUNTER — Ambulatory Visit (INDEPENDENT_AMBULATORY_CARE_PROVIDER_SITE_OTHER): Admitting: Family Medicine

## 2024-02-05 ENCOUNTER — Encounter: Payer: Self-pay | Admitting: Family Medicine

## 2024-02-05 VITALS — BP 130/72 | HR 70 | Temp 97.7°F | Ht 69.0 in | Wt 220.0 lb

## 2024-02-05 DIAGNOSIS — J069 Acute upper respiratory infection, unspecified: Secondary | ICD-10-CM

## 2024-02-05 LAB — POC COVID19 BINAXNOW: SARS Coronavirus 2 Ag: NEGATIVE

## 2024-02-05 LAB — INFLUENZA A AND B AG, IMMUNOASSAY
INFLUENZA A ANTIGEN: NOT DETECTED
INFLUENZA B ANTIGEN: NOT DETECTED

## 2024-02-05 MED ORDER — AMOXICILLIN-POT CLAVULANATE 875-125 MG PO TABS: 1.0000 | ORAL_TABLET | Freq: Two times a day (BID) | ORAL | 0 refills | Status: AC

## 2024-02-05 MED ORDER — NITROGLYCERIN 0.4 MG SL SUBL: 0.4000 mg | SUBLINGUAL_TABLET | SUBLINGUAL | 3 refills | Status: AC | PRN

## 2024-02-05 NOTE — Progress Notes (Signed)
 "  Subjective:    Patient ID: [name], male    DOB: [birth year], 74 y.o.   MRN: [id number]  patient states that his symptoms began on Tuesday.  Symptoms include head congestion, pressure in his left ear, or wearing sound in his left ear, sinus pressure, rhinorrhea, and nonproductive cough.  He denies any body aches.  He denies any fever or chills.  He denies any shortness of breath.  He denies any purulent sputum.  He denies any sore throat.  Flu test today is negative.  COVID test today is negative.  Past Medical History:  Diagnosis Date   Adenomatous colon polyp 2004   Allergy    Anginal pain    Arthritis    Diverticulosis 2011   Dyspnea    Former smoker    GERD (gastroesophageal reflux disease)    HLD (hyperlipidemia)    Hyperplastic colon polyp 2004   Hypertension    Melanoma in situ [facility])    Past Surgical History:  Procedure Laterality Date   COLONOSCOPY  01/21/2009   SEVERAL    CORONARY STENT INTERVENTION N/A 10/04/2020   Procedure: CORONARY STENT INTERVENTION;  Surgeon: Anner Alm ORN, MD;  Location: [facility] INVASIVE CV LAB;  Service: Cardiovascular;  Laterality: N/A;   EYE SURGERY  Nov. 2018   JOINT REPLACEMENT  April 26 2020   LAMINECTOMY N/A 08/14/2022   Procedure: THORACIC LAMINECTOMY FOR TUMOR;  Surgeon: Gillie Duncans, MD;  Location: [facility] OR;  Service: Neurosurgery;  Laterality: N/A;   LEFT HEART CATH AND CORONARY ANGIOGRAPHY N/A 10/04/2020   Procedure: LEFT HEART CATH AND CORONARY ANGIOGRAPHY;  Surgeon: Anner Alm ORN, MD;  Location: [facility] INVASIVE CV LAB;  Service: Cardiovascular;  Laterality: N/A;   LEFT HEART CATH AND CORONARY ANGIOGRAPHY Left 11/30/2020   Procedure: LEFT HEART CATH AND CORONARY ANGIOGRAPHY;  Surgeon: Anner Alm ORN, MD;  Location: [facility] INVASIVE CV LAB;  Service: Cardiovascular;  Laterality: Left;   LYMPH NODE DISSECTION Bilateral 12/28/2021   Procedure: PELVIC LYMPH NODE DISSECTION;  Surgeon: Alvaro Hummer, MD;  Location: [facility] ORS;  Service:  Urology;  Laterality: Bilateral;   PENILE PROSTHESIS IMPLANT N/A 11/25/2023   Procedure: INSERTION, PENILE PROSTHESIS;  Surgeon: Lovie Arlyss LITTIE, MD;  Location: [facility] OR;  Service: Urology;  Laterality: N/A;   ROBOT ASSISTED LAPAROSCOPIC RADICAL PROSTATECTOMY N/A 12/28/2021   Procedure: XI ROBOTIC ASSISTED LAPAROSCOPIC RADICAL PROSTATECTOMY AND INDOCYANINE GREEN DYE INJECTION;  Surgeon: Alvaro Hummer, MD;  Location: [facility] ORS;  Service: Urology;  Laterality: N/A;  3 HRS   TOOTH EXTRACTION     TOTAL KNEE ARTHROPLASTY Right 04/26/2020   Current Outpatient Medications on File Prior to Visit  Medication Sig Dispense Refill   acetaminophen  (TYLENOL ) 500 MG tablet Take 2 tablets (1,000 mg total) by mouth every 8 (eight) hours. 50 tablet 0   losartan  (COZAAR ) 50 MG tablet Take 0.5 tablets (25 mg total) by mouth in the morning. 45 tablet 1   metoprolol  succinate (TOPROL -XL) 25 MG 24 hr tablet Take 1 tablet (25 mg total) by mouth daily. 90 tablet 0   Multiple Vitamins-Minerals (CENTRUM SILVER 50+MEN) TABS Take 1 tablet by mouth daily.     nitroGLYCERIN  (NITROSTAT ) 0.4 MG SL tablet Place 1 tablet (0.4 mg total) under the tongue every 5 (five) minutes as needed for chest pain. 50 tablet 3   rosuvastatin  (CRESTOR ) 40 MG tablet TAKE 1 TABLET BY MOUTH EVERY DAY 90 tablet 3   sulfamethoxazole -trimethoprim  (BACTRIM  DS) 800-160 MG tablet Take 1 tablet by mouth  2 (two) times daily. 14 tablet 0   tiZANidine  (ZANAFLEX ) 4 MG tablet Take 1 tablet (4 mg total) by mouth every 6 (six) hours as needed for muscle spasms. 30 tablet 0   zolpidem  (AMBIEN ) 10 MG tablet Take 1 tablet (10 mg total) by mouth at bedtime as needed for sleep. 30 tablet 1   cetirizine  (ZYRTEC ) 10 MG tablet Take 1 tablet (10 mg total) by mouth daily. (Patient not taking: Reported on 02/05/2024) 90 tablet 2   clotrimazole  (LOTRIMIN  AF) 1 % cream Apply 1 Application topically 2 (two) times daily. (Patient not taking: Reported on 02/05/2024) 30 g 2    predniSONE  (DELTASONE ) 20 MG tablet 3 tabs poqday 1-2, 2 tabs poqday 3-4, 1 tab poqday 5-6 (Patient not taking: Reported on 02/05/2024) 12 tablet 0   No current facility-administered medications on file prior to visit.   Allergies  Allergen Reactions   Levaquin [Levofloxacin In D5w] Shortness Of Breath   Social History   Socioeconomic History   Marital status: Married    Spouse name: [name]   Number of children: 2   Years of education: Not on file   Highest education level: 12th grade  Occupational History   Occupation: part Investment Banker, Corporate: [employer]  Tobacco Use   Smoking status: Former    Current packs/day: 0.00    Average packs/day: 1 pack/day for 30.0 years (30.0 ttl pk-yrs)    Types: Cigarettes, Cigars    Start date: 05/12/1968    Quit date: 05/13/1998    Years since quitting: 25.7   Smokeless tobacco: Current    Types: Chew  Vaping Use   Vaping status: Never Used  Substance and Sexual Activity   Alcohol use: Yes    Alcohol/week: 2.0 standard drinks of alcohol    Types: 2 Cans of beer per week   Drug use: No   Sexual activity: Not Currently  Other Topics Concern   Not on file  Social History Narrative   Married x 51 years   2 grandsons   Social Drivers of Health   Tobacco Use: High Risk (02/05/2024)   Patient History    Smoking Tobacco Use: Former    Smokeless Tobacco Use: Current    Passive Exposure: Not on file  Financial Resource Strain: Low Risk (02/03/2024)   Overall Financial Resource Strain (CARDIA)    Difficulty of Paying Living Expenses: Not hard at all  Food Insecurity: No Food Insecurity (02/03/2024)   Epic    Worried About Programme Researcher, Broadcasting/film/video in the Last Year: Never true    Ran Out of Food in the Last Year: Never true  Transportation Needs: No Transportation Needs (02/03/2024)   Epic    Lack of Transportation (Medical): No    Lack of Transportation (Non-Medical): No  Physical Activity: Insufficiently Active (02/03/2024)   Exercise Vital Sign     Days of Exercise per Week: 3 days    Minutes of Exercise per Session: 20 min  Stress: No Stress Concern Present (02/03/2024)   [employer] of Occupational Health - Occupational Stress Questionnaire    Feeling of Stress: Only a little  Social Connections: Moderately Isolated (02/03/2024)   Social Connection and Isolation Panel    Frequency of Communication with Friends and Family: More than three times a week    Frequency of Social Gatherings with Friends and Family: Once a week    Attends Religious Services: More than 4 times per year    Active Member of  Clubs or Organizations: No    Attends Banker Meetings: Not on file    Marital Status: Widowed  Intimate Partner Violence: Not At Risk (11/20/2023)   Epic    Fear of Current or Ex-Partner: No    Emotionally Abused: No    Physically Abused: No    Sexually Abused: No  Depression (PHQ2-9): Medium Risk (11/20/2023)   Depression (PHQ2-9)    PHQ-2 Score: 5  Alcohol Screen: Low Risk (11/20/2023)   Alcohol Screen    Last Alcohol Screening Score (AUDIT): 3  Housing: Unknown (02/03/2024)   Epic    Unable to Pay for Housing in the Last Year: No    Number of Times Moved in the Last Year: Not on file    Homeless in the Last Year: No  Utilities: Not At Risk (11/20/2023)   Epic    Threatened with loss of utilities: No  Health Literacy: Adequate Health Literacy (11/20/2023)   B1300 Health Literacy    Frequency of need for help with medical instructions: Never      Review of Systems  All other systems reviewed and are negative.      Objective:   Physical Exam Vitals reviewed.  Constitutional:      General: He is not in acute distress.    Appearance: He is well-developed. He is not diaphoretic.  HENT:     Right Ear: Tympanic membrane and ear canal normal.     Left Ear: Tympanic membrane and ear canal normal.     Nose: Congestion and rhinorrhea present. No mucosal edema.     Right Sinus: No maxillary sinus  tenderness or frontal sinus tenderness.     Left Sinus: No maxillary sinus tenderness or frontal sinus tenderness.     Mouth/Throat:     Pharynx: No oropharyngeal exudate.  Eyes:     Conjunctiva/sclera: Conjunctivae normal.     Pupils: Pupils are equal, round, and reactive to light.  Cardiovascular:     Rate and Rhythm: Normal rate and regular rhythm.     Heart sounds: Normal heart sounds.  Pulmonary:     Effort: Pulmonary effort is normal. No respiratory distress.     Breath sounds: No wheezing, rhonchi or rales.  Musculoskeletal:     Cervical back: Neck supple.  Lymphadenopathy:     Cervical: No cervical adenopathy.  Skin:    Findings: No erythema or rash.           Assessment & Plan:  Viral URI I believe the patient has a bad head cold.  I recommended Coricidin for symptom control with Mucinex.  Expect symptoms to gradually improve over the next week.  Patient has a history of sinus infections.  I gave him a prescription for Augmentin  to take if he develops severe pain in his sinuses.  However at the present time I do not see any indication of a bacterial infection "

## 2024-02-05 NOTE — Addendum Note (Signed)
 Addended by: ANGELENA RONAL BRADLEY K on: 02/05/2024 03:51 PM   Modules accepted: Orders

## 2024-02-10 ENCOUNTER — Ambulatory Visit

## 2024-02-10 ENCOUNTER — Telehealth: Payer: Self-pay | Admitting: Cardiology

## 2024-02-10 VITALS — Ht 69.0 in | Wt 217.8 lb

## 2024-02-10 DIAGNOSIS — Z860100 Personal history of colon polyps, unspecified: Secondary | ICD-10-CM

## 2024-02-10 MED ORDER — PEG 3350-KCL-NA BICARB-NACL 420 G PO SOLR: 4000.0000 mL | Freq: Once | ORAL | 0 refills | Status: AC

## 2024-02-10 MED ORDER — SUTAB 1479-225-188 MG PO TABS: 12.0000 | ORAL_TABLET | ORAL | 0 refills | Status: DC

## 2024-02-10 NOTE — Addendum Note (Signed)
 Addended by: GERMAN FRANNE BROCKS on: 02/10/2024 04:35 PM   Modules accepted: Orders

## 2024-02-10 NOTE — Telephone Encounter (Signed)
 Patient called in bout chest pain but stated he didn't need an call back. Appt on 1/23 was enough for him. Please advise

## 2024-02-10 NOTE — Telephone Encounter (Signed)
 Called patient - advised of ER precautions - patient verbalized understanding - denied all symptoms except for chest tightness, stated he had recently had bad head cold  Rescheduled from Friday to Wednesday

## 2024-02-10 NOTE — Progress Notes (Signed)

## 2024-02-11 ENCOUNTER — Encounter: Payer: Self-pay | Admitting: Cardiology

## 2024-02-11 ENCOUNTER — Ambulatory Visit: Attending: Cardiology | Admitting: Cardiology

## 2024-02-11 VITALS — BP 130/68 | HR 70 | Ht 69.0 in | Wt 216.6 lb

## 2024-02-11 DIAGNOSIS — I2510 Atherosclerotic heart disease of native coronary artery without angina pectoris: Secondary | ICD-10-CM | POA: Diagnosis not present

## 2024-02-11 DIAGNOSIS — Z79899 Other long term (current) drug therapy: Secondary | ICD-10-CM

## 2024-02-11 DIAGNOSIS — R011 Cardiac murmur, unspecified: Secondary | ICD-10-CM | POA: Diagnosis not present

## 2024-02-11 DIAGNOSIS — R072 Precordial pain: Secondary | ICD-10-CM | POA: Diagnosis not present

## 2024-02-11 DIAGNOSIS — E782 Mixed hyperlipidemia: Secondary | ICD-10-CM

## 2024-02-11 NOTE — Progress Notes (Signed)
 " Cardiology Office Note   Date:  02/11/2024  ID:  [name], DOB January 26, 1950, MRN [id number] PCP: Duanne Butler DASEN, MD  [facility] Providers Cardiologist:  Redell Cave, MD Cardiology APP:  Gerard Frederick, NP     History of Present Illness [name] is a 74 y.o. male with past medical history of coronary artery disease status post PCI to the proximal LAD (09/2020), hypertension, hyperlipidemia, former smoker who presents today for follow-up of chest tightness after having a chest cold for several weeks.   Prior heart catheterization completed in 10/04/2020 showed proximal to mid LAD 80% stenosis, treated with PCI/DES.  RCA 75% stenosis small nondominant not a PCI target.  Echocardiogram at that time showed an EF of 60 to 65% with impaired relaxation.  Repeat heart catheterization November 2022 showed patent mid LAD stent, 75% proximal RCA stenosis, nondominant vessel. Patient was evaluated in December 2023 doing well from a cardiac perspective.  He was last seen in clinic 05/23/2023 stating from a cardiac perspective he was doing well.  Denied symptoms of chest pain.  Angina had been well-controlled with Imdur .  Previously had easy bruising with Brilinta  and it was stopped bruising symptoms significantly improved.  Taking all of the medications as prescribed.  He was advised to start aspirin  81 mg daily being off of the Brilinta  continued on rosuvastatin  and Toprol -XL.  Repeat echo was ordered to evaluate for progression of any aortic valve calcification.   He returns to clinic today complaining of chest tightness and occasional sharp chest pain that lasts a few seconds. He has intermittently experienced chest tightness and pains that were similar over the past few months, but noticed it worsening with a recent head cold that started two weeks ago. Pt has had a cough with his head cold, but states the tightness and pain comes on randomly and does not seem to be associated with the  cough. Pt took 1 nitroglycerin  SL tablet last week when noticing pain, and states it improved his pain. He saw his PCP on 1/15 and was placed on augmentin  for 10 days, which he is still taking. He reports improvement in symptoms aside from chest tightness and pains that have lingered. Pt denies dizziness, lightheadedness, headaches, shortness of breath, LE swelling, or radiation of pain. EKG today is stable without changes. Pt last LDL was 43 in 2023. Pt endorses compliance with medications and denies side effects. Pt denies other concerns today.   ROS: 10 point review of systems has been reviewed and considered negative the exception was been listed in the HPI  Review of Systems  Constitutional:  Negative for fever and malaise/fatigue.  HENT:  Negative for sinus pain.   Respiratory:  Positive for cough. Negative for sputum production, shortness of breath and wheezing.   Cardiovascular:  Positive for chest pain. Negative for palpitations and leg swelling.  Neurological:  Negative for dizziness and headaches.   Studies Reviewed EKG Interpretation Date/Time:  Wednesday February 11 2024 13:25:48 EST Ventricular Rate:  70 PR Interval:  136 QRS Duration:  102 QT Interval:  412 QTC Calculation: 444 R Axis:   2  Text Interpretation: Normal sinus rhythm Normal ECG When compared with ECG of 23-May-2023 14:07, No acute changes Confirmed by Gerard Frederick ([id number]) on 02/11/2024 1:28:20 PM    Cardiac Studies & Procedures   ______________________________________________________________________________________________ CARDIAC CATHETERIZATION  CARDIAC CATHETERIZATION 11/30/2020  Conclusion   Prox LAD to Mid LAD stent is widely patent   Dist LAD lesion  is 45% stenosed.   Prox RCA lesion is 75% stenosed.  Small nondominant vessel   The left ventricular systolic function is normal.   LV end diastolic pressure is normal.   There is no aortic valve stenosis.  Widely patent LAD stent with moderate  stenosis of small non-dominant RCA (not PCI target). Otherwise minimal CAD in distal LAD & normal LCx-OM-LPL-PDA Normal LV size and function.  Normal EDP.    Recommendation: Consider nonischemic etiology for chest pain.    Alm Clay, MD  Findings Coronary Findings Diagnostic  Dominance: Left  Left Main Vessel is large.  Left Anterior Descending Vessel is large. There is mild diffuse disease throughout the vessel. Non-stenotic Prox LAD to Mid LAD lesion was previously treated. The lesion is located proximal to the major branch, eccentric and irregular. The lesion is moderately calcified. Dist LAD lesion is 45% stenosed. The lesion is segmental and irregular.  First Diagonal Branch Vessel is small in size.  Second Diagonal Branch Vessel is moderate in size.  Third Diagonal Branch Vessel is small in size.  Third Septal Branch Vessel is small in size.  Left Circumflex Vessel is large. Vessel is angiographically normal.  Second Obtuse Marginal Branch Vessel is small in size.  Third Obtuse Marginal Branch Vessel is small in size.  Left Posterior Descending Artery Vessel is angiographically normal.  First Left Posterolateral Branch Vessel is small in size.  Second Left Posterolateral Branch Vessel is angiographically normal.  Left Posterior Atrioventricular Artery Vessel is angiographically normal.  Right Coronary Artery Vessel is small. Prox RCA lesion is 75% stenosed. The lesion is segmental and concentric.  Acute Marginal Branch Vessel is small in size.  Right Ventricular Branch Vessel is small in size.  Intervention  No interventions have been documented.   CARDIAC CATHETERIZATION  CARDIAC CATHETERIZATION 10/04/2020  Conclusion   CULPRIT LESION: Prox LAD to Mid LAD lesion is 80% stenosed.  (Calcified)   Scoring balloon angioplasty was performed using a BALLOON SCOREFLEX 2.75X15.   A drug-eluting stent was successfully placed using a  SYNERGY XD 3.0X16 -> postdilated to 3.3 mm.   Post intervention, there is a 0% residual stenosis.   ------------------------------------------------------------------   Dist LAD lesion is 45% stenosed.   Prox RCA to Mid RCA lesion is 75% stenosed.  Small, nondominant vessel.  Not a PCI target.   ------------------------------------------------------------------   LV end diastolic pressure is normal.  There is no aortic valve stenosis.  SUMMARY Severe single-vessel disease with 80% eccentric calcified lesion in the very proximal/ostial LAD with diffuse mild disease throughout the LAD.SABRA Successful DES PCI after ScoreFlex scoring balloon angioplasty of ostial and proximal LAD 80 % lesion reduced to 0% (TIMI-3 flow pre and post): Synergy DES 3.0 mm x 16 mm postdilated to 3.3 mm. Normal LV function by echocardiogram and normal LVEDP on cath.   RECOMMENDATIONS Okay for same-day discharge after 6 hours Continue to push aggressive GDMT for CAD: We will increase rosuvastatin  to 40 mg.  Will defer titration of additional cardiac medications to his primary cardiologist.   Alm Clay, MD  Findings Coronary Findings Diagnostic  Dominance: Left  Left Main Vessel was injected. Vessel is large.  Left Anterior Descending Vessel is large. There is mild diffuse disease throughout the vessel. Prox LAD to Mid LAD lesion is 80% stenosed. The lesion is located proximal to the major branch, eccentric and irregular. The lesion is moderately calcified. Dist LAD lesion is 45% stenosed. The lesion is segmental and irregular.  First  Diagonal Branch Vessel is small in size.  Second Diagonal Branch Vessel is moderate in size.  Third Diagonal Branch Vessel is small in size.  Third Septal Branch Vessel is small in size.  Left Circumflex Vessel is large. Vessel is angiographically normal.  Second Obtuse Marginal Branch Vessel is small in size.  Third Obtuse Marginal Branch Vessel is small in  size.  Left Posterior Descending Artery Vessel is angiographically normal.  First Left Posterolateral Branch Vessel is small in size.  Second Left Posterolateral Branch Vessel is angiographically normal.  Left Posterior Atrioventricular Artery Vessel is angiographically normal.  Right Coronary Artery Vessel was injected. Vessel is small. Prox RCA to Mid RCA lesion is 75% stenosed. The lesion is segmental and concentric.  Right Ventricular Branch Vessel is small in size.  Intervention  Prox LAD to Mid LAD lesion Angioplasty Lesion length:  15 mm. CATH VISTA GUIDE 6FR XBLAD3.5 guide catheter was inserted. WIRE ASAHI PROWATER 180CM guidewire used to cross lesion. Scoring balloon angioplasty was performed using a BALLOON SCOREFLEX 2.75X15. Maximum pressure: 14 atm. Inflation time: 30 sec. Stent Lesion length:  15 mm. CATH VISTA GUIDE 6FR XBLAD3.5 guide catheter was inserted. Lesion crossed with guidewire using a WIRE ASAHI PROWATER 180CM. Pre-stent angioplasty was performed using a BALLOON SAPPHIRE 2.5X12. Maximum pressure:  12 atm. Inflation time:  20 sec. Following standard balloon, score flex angioplasty performed. A drug-eluting stent was successfully placed using a SYNERGY XD 3.0X16. Maximum pressure: 16 atm. Inflation time: 30 sec. Stent strut is well apposed. The stent is fully expanded and postdilated, there is aN eccentric focal chunk of calcium  just distal to the SP takeoff pushed to the side, but stent widely patent. Post-stent angioplasty was performed using a BALLOON SAPPHIRE [place] 3.25X10. Maximum pressure:  16 atm. Inflation time:  20 sec. Postdilated to 3.3 mm Post-Intervention Lesion Assessment The intervention was successful. Pre-interventional TIMI flow is 3. Treated lesion length:  16 mm. No complications occurred at this lesion. There is a 0% residual stenosis post intervention.     ECHOCARDIOGRAM  ECHOCARDIOGRAM COMPLETE 07/08/2023  Narrative ECHOCARDIOGRAM  REPORT    Patient Name:   MAURICIO DAHLEN Date of Exam: 07/02/2023 Medical Rec #:  [id number]    Height:       72.0 in Accession #:    [id number]   Weight:       217.6 lb Date of Birth:  August 02, 1950   BSA:          2.208 m Patient Age:    72 years     BP:           136/80 mmHg Patient Gender: M            HR:           67 bpm. Exam Location:  [facility]  Procedure: 2D Echo, Color Doppler, Cardiac Doppler and Strain Analysis (Both Spectral and Color Flow Doppler were utilized during procedure).  Indications:    R01.1 Murmur  History:        Patient has prior history of Echocardiogram examinations, most recent 09/28/2020. CAD, Signs/Symptoms:Shortness of Breath and Dyspnea; Risk Factors:Hypertension, Dyslipidemia and Former Smoker.  Referring Phys: [id number] BRIAN AGBOR-ETANG  IMPRESSIONS   1. Left ventricular ejection fraction, by estimation, is 60 to 65%. The left ventricle has normal function. The left ventricle has no regional wall motion abnormalities. There is mild left ventricular hypertrophy. Left ventricular diastolic parameters were normal. The average left ventricular global longitudinal strain is -15.3 %.  The global longitudinal strain is abnormal. 2. Right ventricular systolic function is normal. The right ventricular size is normal. 3. The mitral valve is normal in structure. Mild to moderate mitral valve regurgitation. 4. The aortic valve is tricuspid. Aortic valve regurgitation is mild to moderate. 5. The inferior vena cava is normal in size with greater than 50% respiratory variability, suggesting right atrial pressure of 3 mmHg.  FINDINGS Left Ventricle: Left ventricular ejection fraction, by estimation, is 60 to 65%. The left ventricle has normal function. The left ventricle has no regional wall motion abnormalities. The average left ventricular global longitudinal strain is -15.3 %. Strain was performed and the global longitudinal strain is abnormal. The left ventricular  internal cavity size was normal in size. There is mild left ventricular hypertrophy. Left ventricular diastolic parameters were normal.  Right Ventricle: The right ventricular size is normal. No increase in right ventricular wall thickness. Right ventricular systolic function is normal.  Left Atrium: Left atrial size was normal in size.  Right Atrium: Right atrial size was normal in size.  Pericardium: There is no evidence of pericardial effusion.  Mitral Valve: The mitral valve is normal in structure. Mild to moderate mitral valve regurgitation.  Tricuspid Valve: The tricuspid valve is normal in structure. Tricuspid valve regurgitation is mild.  Aortic Valve: The aortic valve is tricuspid. Aortic valve regurgitation is mild to moderate. Aortic valve mean gradient measures 3.0 mmHg. Aortic valve peak gradient measures 5.5 mmHg. Aortic valve area, by VTI measures 2.64 cm.  Pulmonic Valve: The pulmonic valve was normal in structure. Pulmonic valve regurgitation is mild.  Aorta: The aortic root is normal in size and structure.  Venous: The inferior vena cava is normal in size with greater than 50% respiratory variability, suggesting right atrial pressure of 3 mmHg.  IAS/Shunts: No atrial level shunt detected by color flow Doppler.   LEFT VENTRICLE PLAX 2D LVIDd:         5.84 cm   Diastology LVIDs:         3.81 cm   LV e' medial:    6.74 cm/s LV PW:         1.07 cm   LV E/e' medial:  13.0 LV IVS:        1.03 cm   LV e' lateral:   10.70 cm/s LVOT diam:     2.30 cm   LV E/e' lateral: 8.2 LV SV:         70 LV SV Index:   32        2D Longitudinal Strain LVOT Area:     4.15 cm  2D Strain GLS Avg:     -15.3 %   RIGHT VENTRICLE RV S prime:     9.68 cm/s  LEFT ATRIUM           Index LA diam:      5.00 cm 2.26 cm/m LA Vol (A4C): 64.2 ml 29.07 ml/m AORTIC VALVE AV Area (Vmax):    2.76 cm AV Area (Vmean):   2.66 cm AV Area (VTI):     2.64 cm AV Vmax:           117.00 cm/s AV  Vmean:          79.900 cm/s AV VTI:            0.264 m AV Peak Grad:      5.5 mmHg AV Mean Grad:      3.0 mmHg LVOT Vmax:  77.60 cm/s LVOT Vmean:        51.100 cm/s LVOT VTI:          0.168 m LVOT/AV VTI ratio: 0.64  AORTA Ao Sinus diam: 3.52 cm Ao Asc diam:   3.70 cm  MITRAL VALVE MV Area (PHT): 3.65 cm    SHUNTS MV Decel Time: 208 msec    Systemic VTI:  0.17 m MV E velocity: 87.80 cm/s  Systemic Diam: 2.30 cm MV A velocity: 94.80 cm/s MV E/A ratio:  0.93  Redell Cave MD Electronically signed by Redell Cave MD Signature Date/Time: 07/02/2023/4:22:40 PM    Final      CT SCANS  CT CORONARY FRACTIONAL FLOW RESERVE DATA PREP 09/21/2020  Narrative EXAM: CT FFR ANALYSIS  CLINICAL DATA:  Abnormal CCTA  FINDINGS: FFRct analysis was performed on the original cardiac CT angiogram dataset. Diagrammatic representation of the FFRct analysis is provided in a separate PDF document in PACS. This dictation was created using the PDF document and an interactive 3D model of the results. 3D model is not available in the EMR/PACS. Normal FFR range is >0.80.  1. Left Main:  No significant stenosis.  2. LAD: significant focal stenosis present in the proximal LAD. FFRct 0.75 3. LCX: No significant stenosis. 4. RCA: significant stenosis present in the proximal to mid LAD. FFRct 0.60.  IMPRESSION: 1. CT FFR analysis showed significant stenosis in the proximal LAD and mid RCA (small non dominant vessel).  2.  Recommend left heart catheterization.   Electronically Signed By: Redell Cave M.D. On: 09/22/2020 16:39   CT CORONARY MORPH W/CTA COR W/SCORE 09/21/2020  Addendum 09/21/2020  2:23 PM ADDENDUM REPORT: 09/21/2020 14:20  EXAM: OVER-READ INTERPRETATION  CT CHEST  The following report is an over-read performed by radiologist Dr. Isla Qua [facility] on 09/21/2020. This over-read does not include interpretation of cardiac or  coronary anatomy or pathology. The calcium  score and coronary CTA interpretation by the cardiologist is attached.  COMPARISON:  CT of the chest from 2014.  FINDINGS: Cardiovascular: See dedicated report for cardiovascular details.  Mediastinum/Nodes: Limited imaging of the chest extends to the heart only, imaged portions of the mediastinum without signs of acute finding or adenopathy.  Lungs/Pleura: Basilar atelectasis, no sign of effusion or consolidation. Visualized airways are patent.  Upper Abdomen: Incidental imaging of upper abdominal contents show no acute findings on very limited assessment.  Musculoskeletal: Spinal degenerative changes. No acute or destructive bone process.  IMPRESSION: No acute or significant extracardiac findings.   Electronically Signed By: Isla Blind M.D. On: 09/21/2020 14:20  Narrative CLINICAL DATA:  Chest pain  EXAM: Cardiac/Coronary  CTA  TECHNIQUE: The patient was scanned on a Siemens Somatom go.Top scanner.  : A retrospective scan was triggered in the descending thoracic aorta. Axial non-contrast 3 mm slices were carried out through the heart. The data set was analyzed on a dedicated work station and scored using the Agatson method. Gantry rotation speed was 330 msecs and collimation was .6 mm. 100mg  of metoprolol  and 0.8 mg of sl NTG was given. The 3D data set was reconstructed in 5% intervals of the 60-95 % of the R-R cycle. Diastolic phases were analyzed on a dedicated work station using MPR, MIP and VRT modes. The patient received 75 cc of contrast.  FINDINGS: Aorta:  Normal size.  No calcifications.  No dissection.  Aortic Valve:  Trileaflet.  No calcifications.  Coronary Arteries:  Normal coronary origin.  left dominance.  RCA  is a small non dominant artery. There is calcified plaque in the proximal RCA causing moderate (50-69%) stenosis.  Left main is a large artery that gives rise to LAD and LCX arteries. LM  has no disease.  LAD has calcified plaque in the proximal segment causing severe stenosis (>70%).  LCX is a dominant artery that gives rise to one large OM1 branch. It also supplies the PDA. There is no plaque.  Other findings:  Normal pulmonary vein drainage into the left atrium.  Normal left atrial appendage without a thrombus.  Normal size of the pulmonary artery.  IMPRESSION: 1. Coronary calcium  score of 728. This was 78th percentile for age and sex matched control.  2. Normal coronary origin with left dominance.  3. Severe proximal LAD disease (>70%).  4. Moderate disease in proximal RCA (small non dominant vessel)  5. CAD-RADS 4 Severe stenosis. (70-99% or > 50% left main). Cardiac catheterization or CT FFR is recommended. Consider symptom-guided anti-ischemic pharmacotherapy as well as risk factor modification per guideline directed care.  Additional analysis with CT FFR will be submitted and reported separately.  Electronically Signed: By: Redell Cave M.D. On: 09/21/2020 13:19     ______________________________________________________________________________________________      Risk Assessment/Calculations           Physical Exam VS:  BP 130/68 (BP Location: Left Arm, Patient Position: Sitting, Cuff Size: Normal)   Pulse 70   Ht 5' 9 (1.753 m)   Wt 216 lb 9.6 oz (98.2 kg)   SpO2 97%   BMI 31.99 kg/m        Wt Readings from Last 3 Encounters:  02/11/24 216 lb 9.6 oz (98.2 kg)  02/10/24 217 lb 12.8 oz (98.8 kg)  02/05/24 220 lb (99.8 kg)    GEN: Well nourished, well developed in no acute distress NECK: No JVD; No carotid bruits CARDIAC: RRR, II/VI systolic murmur RUSB, without rubs or gallops RESPIRATORY:  Clear to auscultation without rales, wheezing or rhonchi  ABDOMEN: Soft, non-tender, non-distended EXTREMITIES:  No edema; No deformity   ASSESSMENT AND PLAN Precordial pain with a history of coronary artery disease involving  native coronary arteries with previous stable angina with prior DES to the proximal LAD in 09/2020, repeat left heart catheterization 11/2020 with patent mid LAD stent. Chest tightness and precordial pain relieved with nitroglycerin  that started prior to recent URI but has been worsened with recent illness. EKG sinus rate of 70, stable without changes today. Cardiac PET stress ordered. Hepatic and lipid panel ordered.   Hypertension. BP 130/68. Continue losartan  50 mg daily and Toprol  XL 25 mg daily.   Mixed hyperlipidemia. Last LDL 43 in 2023. Lipid panel ordered today. Continue with crestor  40 mg daily.  Systolic murmur. Previous echo 2025 with aortic valve sclerosis. No significant change from prior echo in 2022.  Repeat surveillance studies every 2 to 3 years for reevaluation of structural changes.    Informed Consent   Shared Decision Making/Informed Consent The risks [chest pain, shortness of breath, cardiac arrhythmias, dizziness, blood pressure fluctuations, myocardial infarction, stroke/transient ischemic attack, nausea, vomiting, allergic reaction, radiation exposure, metallic taste sensation and life-threatening complications (estimated to be 1 in 10,000)], benefits (risk stratification, diagnosing coronary artery disease, treatment guidance) and alternatives of a cardiac PET stress test were discussed in detail with Mr. Schnitzer and he agrees to proceed.     Dispo: Follow-up 6 weeks after stress test, sooner if needed.   Signed, Lissy Deuser, NP   "

## 2024-02-11 NOTE — Patient Instructions (Signed)
 Medication Instructions:  Your physician recommends that you continue on your current medications as directed. Please refer to the Current Medication list given to you today.  *If you need a refill on your cardiac medications before your next appointment, please call your pharmacy*  Lab Work: Your provider would like for you to return in 1-2 weeks to have the following labs drawn: Lipid Panel and Hepatic Panel.   Please go to [facility]          [address] ([facility]) [address], [place] [address]       You do not need an appointment.  They are open from 8 am- 4:30 pm.  Lunch from 1:00 pm- 2:00 pm You WILL need to be fasting.   Or you may have them drawn at your Primary Care Physician If you have labs (blood work) drawn today and your tests are completely normal, you will receive your results only by: MyChart Message (if you have MyChart) OR A paper copy in the mail If you have any lab test that is abnormal or we need to change your treatment, we will call you to review the results.  Testing/Procedures:    Please report to Radiology at the [facility] Main Entrance 30 minutes early for your test.  [address] [place], [place] [address]                         OR   Please report to Radiology at [facility] Main Entrance, medical mall, 30 mins prior to your test.  [address]  [place], [place]  How to Prepare for Your Cardiac PET/CT Stress Test:  Nothing to eat or drink, except water , 3 hours prior to arrival time.  NO caffeine/decaffeinated products, or chocolate 12 hours prior to arrival. (Please note decaffeinated beverages (teas/coffees) still contain caffeine).  If you have caffeine within 12 hours prior, the test will need to be rescheduled.  Medication instructions:  You may take your remaining medications with water .  NO cologne or lotion on chest or abdomen area.  Total time is 1 to 2 hours; you  may want to bring reading material for the waiting time.  In preparation for your appointment, medication and supplies will be purchased.  Appointment availability is limited, so if you need to cancel or reschedule, please call the Radiology Department Scheduler at [phone] 24 hours in advance to avoid a cancellation fee of $100.00  What to Expect When you Arrive:  Once you arrive and check in for your appointment, you will be taken to a preparation room within the Radiology Department.  A technologist or Nurse will obtain your medical history, verify that you are correctly prepped for the exam, and explain the procedure.  Afterwards, an IV will be started in your arm and electrodes will be placed on your skin for EKG monitoring during the stress portion of the exam. Then you will be escorted to the PET/CT scanner.  There, staff will get you positioned on the scanner and obtain a blood pressure and EKG.  During the exam, you will continue to be connected to the EKG and blood pressure machines.  A small, safe amount of a radioactive tracer will be injected in your IV to obtain a series of pictures of your heart along with an injection of a stress agent.    After your Exam:  It is recommended that you eat a meal  and drink a caffeinated beverage to counter act any effects of the stress agent.  Drink plenty of fluids for the remainder of the day and urinate frequently for the first couple of hours after the exam.  Your doctor will inform you of your test results within 7-10 business days.  For more information and frequently asked questions, please visit our website: [email or web address]  For questions about your test or how to prepare for your test, please call: Cardiac Imaging Nurse Navigators Office: [phone]   Follow-Up: At [facility], you and your health needs are our priority.  As part of our continuing mission to provide you with exceptional heart care, our  providers are all part of one team.  This team includes your primary Cardiologist (physician) and Advanced Practice Providers or APPs (Physician Assistants and Nurse Practitioners) who all work together to provide you with the care you need, when you need it.  Your next appointment:   6 week(s)  Provider:   You may see Redell Cave, MD or one of the following Advanced Practice Providers on your designated Care Team:    Tylene Lunch, NP   We recommend signing up for the patient portal called MyChart.  Sign up information is provided on this After Visit Summary.  MyChart is used to connect with patients for Virtual Visits (Telemedicine).  Patients are able to view lab/test results, encounter notes, upcoming appointments, etc.  Non-urgent messages can be sent to your provider as well.   To learn more about what you can do with MyChart, go to [email or web address].

## 2024-02-11 NOTE — Addendum Note (Signed)
 Addended by: HARL HERON DEL on: 02/11/2024 03:52 PM   Modules accepted: Orders

## 2024-02-12 ENCOUNTER — Encounter: Payer: Self-pay | Admitting: Pediatrics

## 2024-02-13 ENCOUNTER — Ambulatory Visit: Admitting: Nurse Practitioner

## 2024-02-19 ENCOUNTER — Other Ambulatory Visit: Payer: Self-pay | Admitting: Family Medicine

## 2024-02-24 ENCOUNTER — Other Ambulatory Visit: Payer: Self-pay | Admitting: Family Medicine

## 2024-02-24 ENCOUNTER — Encounter: Admitting: Pediatrics

## 2024-02-25 MED ORDER — ZOLPIDEM TARTRATE 10 MG PO TABS: 10.0000 mg | ORAL_TABLET | Freq: Every evening | ORAL | 1 refills | Status: AC | PRN

## 2024-02-25 NOTE — Telephone Encounter (Signed)
 Requested medication (s) are due for refill today: yes  Requested medication (s) are on the active medication list: yes  Last refill:  02/20/24  Future visit scheduled: yes  Notes to clinic:  Unable to refill per protocol, cannot delegate.      Requested Prescriptions  Pending Prescriptions Disp Refills   zolpidem  (AMBIEN ) 10 MG tablet 30 tablet 1    Sig: Take 1 tablet (10 mg total) by mouth at bedtime as needed. for sleep     Not Delegated - Psychiatry:  Anxiolytics/Hypnotics Failed - 02/25/2024  4:09 PM      Failed - This refill cannot be delegated      Failed - Urine Drug Screen completed in last 360 days      Passed - Valid encounter within last 6 months    Recent Outpatient Visits           2 weeks ago Viral URI   [place] [facility] Duanne Butler DASEN, MD   2 months ago Verrucous lesion of skin   [place] [facility] Duanne Butler DASEN, MD   6 months ago Acute right-sided low back pain without sciatica   [place] [facility] Duanne Butler DASEN, MD   9 months ago Seborrheic keratosis   [place] [facility] Duanne Butler DASEN, MD   11 months ago Chronic midline low back pain without sciatica   [place] [facility] Pickard, Butler DASEN, MD       Future Appointments             In 1 month Agbor-Etang, Redell, MD [facility]

## 2024-02-26 ENCOUNTER — Other Ambulatory Visit

## 2024-02-26 DIAGNOSIS — I25119 Atherosclerotic heart disease of native coronary artery with unspecified angina pectoris: Secondary | ICD-10-CM

## 2024-02-26 DIAGNOSIS — I209 Angina pectoris, unspecified: Secondary | ICD-10-CM

## 2024-02-26 DIAGNOSIS — E7800 Pure hypercholesterolemia, unspecified: Secondary | ICD-10-CM

## 2024-02-26 DIAGNOSIS — I10 Essential (primary) hypertension: Secondary | ICD-10-CM

## 2024-02-27 ENCOUNTER — Ambulatory Visit: Payer: Self-pay | Admitting: Family Medicine

## 2024-02-27 LAB — CBC WITH DIFFERENTIAL/PLATELET
Absolute Lymphocytes: 2305 {cells}/uL (ref 850–3900)
Absolute Monocytes: 757 {cells}/uL (ref 200–950)
Basophils Absolute: 86 {cells}/uL (ref 0–200)
Basophils Relative: 1 %
Eosinophils Absolute: 783 {cells}/uL — ABNORMAL HIGH (ref 15–500)
Eosinophils Relative: 9.1 %
HCT: 44.9 % (ref 39.4–51.1)
Hemoglobin: 14.4 g/dL (ref 13.2–17.1)
MCH: 28.5 pg (ref 27.0–33.0)
MCHC: 32.1 g/dL (ref 31.6–35.4)
MCV: 88.7 fL (ref 81.4–101.7)
MPV: 10.9 fL (ref 7.5–12.5)
Monocytes Relative: 8.8 %
Neutro Abs: 4670 {cells}/uL (ref 1500–7800)
Neutrophils Relative %: 54.3 %
Platelets: 332 10*3/uL (ref 140–400)
RBC: 5.06 Million/uL (ref 4.20–5.80)
RDW: 13.1 % (ref 11.0–15.0)
Total Lymphocyte: 26.8 %
WBC: 8.6 10*3/uL (ref 3.8–10.8)

## 2024-02-27 LAB — COMPLETE METABOLIC PANEL WITHOUT GFR
AG Ratio: 2.3 (calc) (ref 1.0–2.5)
ALT: 24 U/L (ref 9–46)
AST: 25 U/L (ref 10–35)
Albumin: 4.3 g/dL (ref 3.6–5.1)
Alkaline phosphatase (APISO): 73 U/L (ref 35–144)
BUN: 17 mg/dL (ref 7–25)
CO2: 29 mmol/L (ref 20–32)
Calcium: 9.2 mg/dL (ref 8.6–10.3)
Chloride: 104 mmol/L (ref 98–110)
Creat: 1.21 mg/dL (ref 0.70–1.28)
Globulin: 1.9 g/dL (ref 1.9–3.7)
Glucose, Bld: 90 mg/dL (ref 65–99)
Potassium: 4.9 mmol/L (ref 3.5–5.3)
Sodium: 139 mmol/L (ref 135–146)
Total Bilirubin: 0.8 mg/dL (ref 0.2–1.2)
Total Protein: 6.2 g/dL (ref 6.1–8.1)

## 2024-02-27 LAB — LIPID PANEL
Cholesterol: 83 mg/dL
HDL: 36 mg/dL — ABNORMAL LOW
LDL Cholesterol (Calc): 32 mg/dL
Non-HDL Cholesterol (Calc): 47 mg/dL
Total CHOL/HDL Ratio: 2.3 (calc)
Triglycerides: 72 mg/dL

## 2024-03-04 ENCOUNTER — Ambulatory Visit

## 2024-03-26 ENCOUNTER — Ambulatory Visit: Admitting: Cardiology

## 2024-11-25 ENCOUNTER — Ambulatory Visit
